# Patient Record
Sex: Female | Born: 1935 | Race: White | Hispanic: No | Smoking: Former smoker
Health system: Southern US, Community
[De-identification: ages and names within clinical notes are randomized; demographics above are authoritative.]

## PROBLEM LIST (undated history)

## (undated) DIAGNOSIS — K219 Gastro-esophageal reflux disease without esophagitis: Secondary | ICD-10-CM

## (undated) DIAGNOSIS — J9690 Respiratory failure, unspecified, unspecified whether with hypoxia or hypercapnia: Secondary | ICD-10-CM

## (undated) DIAGNOSIS — C50919 Malignant neoplasm of unspecified site of unspecified female breast: Secondary | ICD-10-CM

## (undated) DIAGNOSIS — I4891 Unspecified atrial fibrillation: Secondary | ICD-10-CM

## (undated) DIAGNOSIS — I10 Essential (primary) hypertension: Secondary | ICD-10-CM

## (undated) DIAGNOSIS — I509 Heart failure, unspecified: Secondary | ICD-10-CM

## (undated) DIAGNOSIS — K222 Esophageal obstruction: Secondary | ICD-10-CM

## (undated) DIAGNOSIS — N183 Chronic kidney disease, stage 3 unspecified: Secondary | ICD-10-CM

## (undated) DIAGNOSIS — I739 Peripheral vascular disease, unspecified: Secondary | ICD-10-CM

## (undated) DIAGNOSIS — R570 Cardiogenic shock: Secondary | ICD-10-CM

## (undated) DIAGNOSIS — I5181 Takotsubo syndrome: Secondary | ICD-10-CM

## (undated) DIAGNOSIS — L0390 Cellulitis, unspecified: Secondary | ICD-10-CM

## (undated) DIAGNOSIS — J189 Pneumonia, unspecified organism: Secondary | ICD-10-CM

## (undated) DIAGNOSIS — I2510 Atherosclerotic heart disease of native coronary artery without angina pectoris: Secondary | ICD-10-CM

## (undated) DIAGNOSIS — K469 Unspecified abdominal hernia without obstruction or gangrene: Secondary | ICD-10-CM

## (undated) DIAGNOSIS — I213 ST elevation (STEMI) myocardial infarction of unspecified site: Secondary | ICD-10-CM

## (undated) DIAGNOSIS — J449 Chronic obstructive pulmonary disease, unspecified: Secondary | ICD-10-CM

## (undated) DIAGNOSIS — J90 Pleural effusion, not elsewhere classified: Secondary | ICD-10-CM

## (undated) DIAGNOSIS — K5790 Diverticulosis of intestine, part unspecified, without perforation or abscess without bleeding: Secondary | ICD-10-CM

## (undated) HISTORY — PX: OTHER SURGICAL HISTORY: SHX169

## (undated) HISTORY — DX: Cellulitis, unspecified: L03.90

## (undated) HISTORY — DX: Unspecified abdominal hernia without obstruction or gangrene: K46.9

## (undated) HISTORY — PX: CATARACT EXTRACTION: SUR2

## (undated) HISTORY — PX: MASTECTOMY: SHX3

## (undated) HISTORY — DX: ST elevation (STEMI) myocardial infarction of unspecified site: I21.3

## (undated) HISTORY — PX: CORONARY ANGIOPLASTY: SHX604

---

## 2011-04-20 ENCOUNTER — Inpatient Hospital Stay: Payer: Self-pay | Admitting: Specialist

## 2011-09-07 ENCOUNTER — Emergency Department: Payer: Self-pay | Admitting: *Deleted

## 2011-09-07 LAB — COMPREHENSIVE METABOLIC PANEL
Albumin: 3.8 g/dL (ref 3.4–5.0)
Alkaline Phosphatase: 49 U/L — ABNORMAL LOW (ref 50–136)
Anion Gap: 8 (ref 7–16)
BUN: 14 mg/dL (ref 7–18)
Calcium, Total: 9.1 mg/dL (ref 8.5–10.1)
Creatinine: 0.98 mg/dL (ref 0.60–1.30)
EGFR (African American): >60
EGFR (Non-African Amer.): 57 — ABNORMAL LOW
Glucose: 119 mg/dL — ABNORMAL HIGH (ref 65–99)
Osmolality: 274 (ref 275–301)
SGOT(AST): 23 U/L (ref 15–37)
SGPT (ALT): 18 U/L
Sodium: 136 mmol/L (ref 136–145)

## 2011-09-07 LAB — CBC
MCH: 32.4 pg (ref 26.0–34.0)
MCHC: 33.7 g/dL (ref 32.0–36.0)
MCV: 96 fL (ref 80–100)
WBC: 8.9 10*3/uL (ref 3.6–11.0)

## 2012-01-07 ENCOUNTER — Emergency Department: Payer: Self-pay | Admitting: Emergency Medicine

## 2012-01-07 LAB — URINALYSIS, COMPLETE
Bilirubin,UR: NEGATIVE
Nitrite: POSITIVE
Ph: 7 (ref 4.5–8.0)
Protein: NEGATIVE
RBC,UR: 2 /HPF (ref 0–5)
Specific Gravity: 1.005 (ref 1.003–1.030)
Squamous Epithelial: 1
WBC UR: 53 /HPF (ref 0–5)

## 2012-01-07 LAB — BASIC METABOLIC PANEL
Anion Gap: 7 (ref 7–16)
BUN: 11 mg/dL (ref 7–18)
Calcium, Total: 9.3 mg/dL (ref 8.5–10.1)
Chloride: 104 mmol/L (ref 98–107)
Co2: 26 mmol/L (ref 21–32)
Osmolality: 273 (ref 275–301)
Potassium: 4.3 mmol/L (ref 3.5–5.1)
Sodium: 137 mmol/L (ref 136–145)

## 2012-03-27 ENCOUNTER — Inpatient Hospital Stay: Payer: Self-pay | Admitting: Internal Medicine

## 2012-03-27 LAB — CBC
HCT: 35.9 % (ref 35.0–47.0)
HGB: 12.1 g/dL (ref 12.0–16.0)
MCH: 32.2 pg (ref 26.0–34.0)
MCV: 96 fL (ref 80–100)
RBC: 3.75 10*6/uL — ABNORMAL LOW (ref 3.80–5.20)
RDW: 13.7 % (ref 11.5–14.5)
WBC: 6.7 10*3/uL (ref 3.6–11.0)

## 2012-03-27 LAB — COMPREHENSIVE METABOLIC PANEL
Albumin: 2.5 g/dL — ABNORMAL LOW (ref 3.4–5.0)
Anion Gap: 6 — ABNORMAL LOW (ref 7–16)
BUN: 12 mg/dL (ref 7–18)
Bilirubin,Total: 0.4 mg/dL (ref 0.2–1.0)
Chloride: 98 mmol/L (ref 98–107)
Co2: 32 mmol/L (ref 21–32)
Creatinine: 0.76 mg/dL (ref 0.60–1.30)
EGFR (African American): >60
EGFR (Non-African Amer.): >60
Glucose: 152 mg/dL — ABNORMAL HIGH (ref 65–99)
Osmolality: 275 (ref 275–301)
SGOT(AST): 29 U/L (ref 15–37)
Sodium: 136 mmol/L (ref 136–145)
Total Protein: 7.5 g/dL (ref 6.4–8.2)

## 2012-03-27 LAB — TROPONIN I: Troponin-I: <0.02 ng/mL

## 2012-03-27 LAB — CK TOTAL AND CKMB (NOT AT ARMC)
CK, Total: 40 U/L
CK-MB: 1 ng/mL
CK-MB: 1.1 ng/mL (ref 0.5–3.6)

## 2012-03-27 LAB — PRO B NATRIURETIC PEPTIDE: B-Type Natriuretic Peptide: 980 pg/mL — ABNORMAL HIGH

## 2012-03-28 DIAGNOSIS — I200 Unstable angina: Secondary | ICD-10-CM

## 2012-03-28 LAB — BASIC METABOLIC PANEL
Anion Gap: 7 (ref 7–16)
BUN: 18 mg/dL (ref 7–18)
Chloride: 99 mmol/L (ref 98–107)
EGFR (Non-African Amer.): 56 — ABNORMAL LOW
Osmolality: 284 (ref 275–301)

## 2012-03-28 LAB — CBC WITH DIFFERENTIAL/PLATELET
Basophil #: 0 10*3/uL (ref 0.0–0.1)
Eosinophil #: 0 10*3/uL (ref 0.0–0.7)
Eosinophil %: 0 %
HCT: 33.6 % — ABNORMAL LOW (ref 35.0–47.0)
HGB: 11.5 g/dL — ABNORMAL LOW (ref 12.0–16.0)
Lymphocyte #: 0.4 10*3/uL — ABNORMAL LOW (ref 1.0–3.6)
MCH: 32.7 pg (ref 26.0–34.0)
MCHC: 34.2 g/dL (ref 32.0–36.0)
MCV: 96 fL (ref 80–100)
Monocyte #: 0.1 x10 3/mm — ABNORMAL LOW (ref 0.2–0.9)
Neutrophil #: 6.3 10*3/uL (ref 1.4–6.5)
Neutrophil %: 92.3 %
Platelet: 389 10*3/uL (ref 150–440)
RBC: 3.52 10*6/uL — ABNORMAL LOW (ref 3.80–5.20)
RDW: 13.6 % (ref 11.5–14.5)

## 2012-03-28 LAB — LIPID PANEL
HDL Cholesterol: 27 mg/dL — ABNORMAL LOW (ref 40–60)
Ldl Cholesterol, Calc: 32 mg/dL (ref 0–100)
VLDL Cholesterol, Calc: 8 mg/dL (ref 5–40)

## 2012-03-28 LAB — CK TOTAL AND CKMB (NOT AT ARMC): CK-MB: 1.2 ng/mL (ref 0.5–3.6)

## 2012-03-28 LAB — TROPONIN I: Troponin-I: <0.02 ng/mL

## 2012-03-28 LAB — T4, FREE: Free Thyroxine: 1.29 ng/dL (ref 0.76–1.46)

## 2012-03-28 LAB — HEMOGLOBIN A1C: Hemoglobin A1C: 6.4 % — ABNORMAL HIGH (ref 4.2–6.3)

## 2012-04-02 LAB — CULTURE, BLOOD (SINGLE)

## 2012-08-24 ENCOUNTER — Ambulatory Visit: Payer: Self-pay | Admitting: Family Medicine

## 2012-09-03 ENCOUNTER — Emergency Department: Payer: Self-pay | Admitting: Emergency Medicine

## 2012-09-03 LAB — URINALYSIS, COMPLETE
Bacteria: NONE SEEN
Bilirubin,UR: NEGATIVE
Blood: NEGATIVE
Glucose,UR: NEGATIVE mg/dL (ref 0–75)
Ketone: NEGATIVE
Nitrite: NEGATIVE
Ph: 7 (ref 4.5–8.0)
Protein: NEGATIVE
Specific Gravity: 1.009 (ref 1.003–1.030)
Squamous Epithelial: <1
WBC UR: 3 /HPF (ref 0–5)

## 2012-09-03 LAB — TROPONIN I: Troponin-I: <0.02 ng/mL

## 2012-09-03 LAB — CBC
MCH: 32.7 pg (ref 26.0–34.0)
MCV: 95 fL (ref 80–100)
Platelet: 399 10*3/uL (ref 150–440)
RDW: 13.1 % (ref 11.5–14.5)

## 2012-09-03 LAB — COMPREHENSIVE METABOLIC PANEL
Albumin: 2.9 g/dL — ABNORMAL LOW (ref 3.4–5.0)
Alkaline Phosphatase: 68 U/L (ref 50–136)
Anion Gap: 4 — ABNORMAL LOW (ref 7–16)
BUN: 15 mg/dL (ref 7–18)
Chloride: 99 mmol/L (ref 98–107)
Co2: 31 mmol/L (ref 21–32)
Creatinine: 1.16 mg/dL (ref 0.60–1.30)
EGFR (African American): 49 — ABNORMAL LOW
EGFR (Non-African Amer.): 42 — ABNORMAL LOW
Glucose: 118 mg/dL — ABNORMAL HIGH (ref 65–99)
Osmolality: 270 (ref 275–301)
SGOT(AST): 20 U/L (ref 15–37)
SGPT (ALT): 16 U/L (ref 12–78)
Sodium: 134 mmol/L — ABNORMAL LOW (ref 136–145)

## 2012-09-03 LAB — CK TOTAL AND CKMB (NOT AT ARMC): CK, Total: 40 U/L (ref 21–215)

## 2012-10-08 DIAGNOSIS — I5181 Takotsubo syndrome: Secondary | ICD-10-CM

## 2012-10-08 HISTORY — DX: Takotsubo syndrome: I51.81

## 2012-10-24 ENCOUNTER — Inpatient Hospital Stay (HOSPITAL_COMMUNITY): Payer: Medicare Other

## 2012-10-24 ENCOUNTER — Other Ambulatory Visit: Payer: Self-pay

## 2012-10-24 ENCOUNTER — Encounter (HOSPITAL_COMMUNITY): Payer: Self-pay | Admitting: Internal Medicine

## 2012-10-24 ENCOUNTER — Inpatient Hospital Stay (HOSPITAL_COMMUNITY)
Admission: EM | Admit: 2012-10-24 | Discharge: 2012-11-06 | DRG: 208 | Disposition: A | Discharge status: Skilled Nursing Facility | Payer: Medicare Other | Source: Other Acute Inpatient Hospital | Attending: Cardiovascular Disease | Admitting: Cardiovascular Disease

## 2012-10-24 ENCOUNTER — Encounter (HOSPITAL_COMMUNITY)
Admission: EM | Disposition: A | Discharge status: Skilled Nursing Facility | Payer: Self-pay | Attending: Cardiovascular Disease

## 2012-10-24 ENCOUNTER — Inpatient Hospital Stay: Payer: Self-pay | Admitting: Internal Medicine

## 2012-10-24 DIAGNOSIS — R630 Anorexia: Secondary | ICD-10-CM | POA: Diagnosis present

## 2012-10-24 DIAGNOSIS — I739 Peripheral vascular disease, unspecified: Secondary | ICD-10-CM | POA: Diagnosis present

## 2012-10-24 DIAGNOSIS — Z923 Personal history of irradiation: Secondary | ICD-10-CM

## 2012-10-24 DIAGNOSIS — I5021 Acute systolic (congestive) heart failure: Secondary | ICD-10-CM

## 2012-10-24 DIAGNOSIS — D72829 Elevated white blood cell count, unspecified: Secondary | ICD-10-CM | POA: Diagnosis present

## 2012-10-24 DIAGNOSIS — K219 Gastro-esophageal reflux disease without esophagitis: Secondary | ICD-10-CM

## 2012-10-24 DIAGNOSIS — Z9221 Personal history of antineoplastic chemotherapy: Secondary | ICD-10-CM

## 2012-10-24 DIAGNOSIS — J9601 Acute respiratory failure with hypoxia: Secondary | ICD-10-CM

## 2012-10-24 DIAGNOSIS — J9600 Acute respiratory failure, unspecified whether with hypoxia or hypercapnia: Principal | ICD-10-CM

## 2012-10-24 DIAGNOSIS — K222 Esophageal obstruction: Secondary | ICD-10-CM

## 2012-10-24 DIAGNOSIS — Z853 Personal history of malignant neoplasm of breast: Secondary | ICD-10-CM

## 2012-10-24 DIAGNOSIS — Z9010 Acquired absence of unspecified breast and nipple: Secondary | ICD-10-CM

## 2012-10-24 DIAGNOSIS — R5381 Other malaise: Secondary | ICD-10-CM | POA: Diagnosis present

## 2012-10-24 DIAGNOSIS — I213 ST elevation (STEMI) myocardial infarction of unspecified site: Secondary | ICD-10-CM

## 2012-10-24 DIAGNOSIS — D696 Thrombocytopenia, unspecified: Secondary | ICD-10-CM | POA: Diagnosis not present

## 2012-10-24 DIAGNOSIS — I129 Hypertensive chronic kidney disease with stage 1 through stage 4 chronic kidney disease, or unspecified chronic kidney disease: Secondary | ICD-10-CM | POA: Diagnosis present

## 2012-10-24 DIAGNOSIS — J4489 Other specified chronic obstructive pulmonary disease: Secondary | ICD-10-CM | POA: Diagnosis present

## 2012-10-24 DIAGNOSIS — I4891 Unspecified atrial fibrillation: Secondary | ICD-10-CM

## 2012-10-24 DIAGNOSIS — I219 Acute myocardial infarction, unspecified: Secondary | ICD-10-CM

## 2012-10-24 DIAGNOSIS — R570 Cardiogenic shock: Secondary | ICD-10-CM

## 2012-10-24 DIAGNOSIS — J90 Pleural effusion, not elsewhere classified: Secondary | ICD-10-CM

## 2012-10-24 DIAGNOSIS — IMO0002 Reserved for concepts with insufficient information to code with codable children: Secondary | ICD-10-CM

## 2012-10-24 DIAGNOSIS — J449 Chronic obstructive pulmonary disease, unspecified: Secondary | ICD-10-CM

## 2012-10-24 DIAGNOSIS — I2109 ST elevation (STEMI) myocardial infarction involving other coronary artery of anterior wall: Secondary | ICD-10-CM | POA: Diagnosis present

## 2012-10-24 DIAGNOSIS — E876 Hypokalemia: Secondary | ICD-10-CM | POA: Diagnosis present

## 2012-10-24 DIAGNOSIS — I2510 Atherosclerotic heart disease of native coronary artery without angina pectoris: Secondary | ICD-10-CM | POA: Diagnosis present

## 2012-10-24 DIAGNOSIS — J189 Pneumonia, unspecified organism: Secondary | ICD-10-CM

## 2012-10-24 DIAGNOSIS — R7309 Other abnormal glucose: Secondary | ICD-10-CM | POA: Diagnosis not present

## 2012-10-24 DIAGNOSIS — I5181 Takotsubo syndrome: Secondary | ICD-10-CM

## 2012-10-24 DIAGNOSIS — J690 Pneumonitis due to inhalation of food and vomit: Secondary | ICD-10-CM | POA: Diagnosis present

## 2012-10-24 DIAGNOSIS — I509 Heart failure, unspecified: Secondary | ICD-10-CM | POA: Diagnosis present

## 2012-10-24 DIAGNOSIS — N183 Chronic kidney disease, stage 3 unspecified: Secondary | ICD-10-CM | POA: Diagnosis present

## 2012-10-24 HISTORY — DX: Gastro-esophageal reflux disease without esophagitis: K21.9

## 2012-10-24 HISTORY — DX: Essential (primary) hypertension: I10

## 2012-10-24 HISTORY — DX: Respiratory failure, unspecified, unspecified whether with hypoxia or hypercapnia: J96.90

## 2012-10-24 HISTORY — DX: Unspecified atrial fibrillation: I48.91

## 2012-10-24 HISTORY — DX: Chronic kidney disease, stage 3 (moderate): N18.3

## 2012-10-24 HISTORY — DX: Pleural effusion, not elsewhere classified: J90

## 2012-10-24 HISTORY — DX: Atherosclerotic heart disease of native coronary artery without angina pectoris: I25.10

## 2012-10-24 HISTORY — DX: Takotsubo syndrome: I51.81

## 2012-10-24 HISTORY — DX: Diverticulosis of intestine, part unspecified, without perforation or abscess without bleeding: K57.90

## 2012-10-24 HISTORY — DX: Esophageal obstruction: K22.2

## 2012-10-24 HISTORY — DX: Chronic kidney disease, stage 3 unspecified: N18.30

## 2012-10-24 HISTORY — DX: Pneumonia, unspecified organism: J18.9

## 2012-10-24 HISTORY — DX: Cardiogenic shock: R57.0

## 2012-10-24 HISTORY — DX: Malignant neoplasm of unspecified site of unspecified female breast: C50.919

## 2012-10-24 HISTORY — DX: Chronic obstructive pulmonary disease, unspecified: J44.9

## 2012-10-24 HISTORY — DX: Peripheral vascular disease, unspecified: I73.9

## 2012-10-24 HISTORY — PX: LEFT HEART CATHETERIZATION WITH CORONARY ANGIOGRAM: SHX5451

## 2012-10-24 LAB — POCT I-STAT 3, ART BLOOD GAS (G3+)
O2 Saturation: 99 %
pCO2 arterial: 44.4 mmHg (ref 35.0–45.0)
pO2, Arterial: 161 mmHg — ABNORMAL HIGH (ref 80.0–100.0)

## 2012-10-24 LAB — COMPREHENSIVE METABOLIC PANEL
ALT: 53 U/L — ABNORMAL HIGH (ref 0–35)
Albumin: 3.4 g/dL (ref 3.4–5.0)
Alkaline Phosphatase: 77 U/L (ref 50–136)
Alkaline Phosphatase: 40 U/L (ref 39–117)
Anion Gap: 11 (ref 7–16)
BUN: 12 mg/dL (ref 7–18)
Bilirubin,Total: 0.4 mg/dL (ref 0.2–1.0)
CO2: 19 mEq/L (ref 19–32)
Calcium, Total: 8.6 mg/dL (ref 8.5–10.1)
Chloride: 100 mmol/L (ref 98–107)
Chloride: 101 mEq/L (ref 96–112)
Co2: 25 mmol/L (ref 21–32)
Creatinine: 1.05 mg/dL (ref 0.60–1.30)
EGFR (African American): 55 — ABNORMAL LOW
EGFR (Non-African Amer.): 47 — ABNORMAL LOW
GFR calc Af Amer: 63 mL/min — ABNORMAL LOW (ref >90)
GFR calc non Af Amer: 54 mL/min — ABNORMAL LOW (ref >90)
Glucose, Bld: 253 mg/dL — ABNORMAL HIGH (ref 70–99)
Glucose: 361 mg/dL — ABNORMAL HIGH (ref 65–99)
Potassium: 4 mmol/L (ref 3.5–5.1)
Potassium: 4.3 mEq/L (ref 3.5–5.1)
SGOT(AST): 98 U/L — ABNORMAL HIGH (ref 15–37)
Sodium: 134 mEq/L — ABNORMAL LOW (ref 135–145)
Total Protein: 7.2 g/dL (ref 6.4–8.2)

## 2012-10-24 LAB — CBC
HGB: 13.9 g/dL (ref 12.0–16.0)
MCH: 32.2 pg (ref 26.0–34.0)
Platelet: 238 10*3/uL (ref 150–440)
RDW: 15 % — ABNORMAL HIGH (ref 11.5–14.5)

## 2012-10-24 LAB — URINALYSIS, COMPLETE
Blood: NEGATIVE
Nitrite: NEGATIVE
Protein: 30
Specific Gravity: 1.01 (ref 1.003–1.030)
WBC UR: <1 /HPF (ref 0–5)

## 2012-10-24 LAB — LACTIC ACID, PLASMA: Lactic Acid, Venous: 3.1 mmol/L — ABNORMAL HIGH (ref 0.5–2.2)

## 2012-10-24 LAB — CK TOTAL AND CKMB (NOT AT ARMC)
CK, Total: 112 U/L (ref 21–215)
CK-MB: 6.1 ng/mL — ABNORMAL HIGH (ref 0.5–3.6)

## 2012-10-24 LAB — PROTIME-INR: INR: 1.11 (ref 0.00–1.49)

## 2012-10-24 LAB — CBC WITH DIFFERENTIAL/PLATELET
HCT: 40.8 % (ref 36.0–46.0)
Hemoglobin: 14 g/dL (ref 12.0–15.0)
Lymphocytes Relative: 7 % — ABNORMAL LOW (ref 12–46)
Lymphs Abs: 0.8 10*3/uL (ref 0.7–4.0)
MCHC: 34.3 g/dL (ref 30.0–36.0)
Monocytes Absolute: 0.8 10*3/uL (ref 0.1–1.0)
Monocytes Relative: 7 % (ref 3–12)
Neutro Abs: 9.5 10*3/uL — ABNORMAL HIGH (ref 1.7–7.7)

## 2012-10-24 LAB — LIPASE, BLOOD: Lipase: 156 U/L (ref 73–393)

## 2012-10-24 LAB — HEMOGLOBIN A1C: Hemoglobin A1C: 6.2 % (ref 4.2–6.3)

## 2012-10-24 LAB — TROPONIN I
Troponin I: 8.67 ng/mL (ref <0.30)
Troponin-I: 1.4 ng/mL — ABNORMAL HIGH

## 2012-10-24 SURGERY — LEFT HEART CATHETERIZATION WITH CORONARY ANGIOGRAM
Anesthesia: LOCAL

## 2012-10-24 MED ORDER — BIOTENE DRY MOUTH MT LIQD
15.0000 mL | Freq: Four times a day (QID) | OROMUCOSAL | Status: DC
  Administered 2012-10-25 – 2012-10-26 (×6): 15 mL via OROMUCOSAL

## 2012-10-24 MED ORDER — SODIUM CHLORIDE 0.9 % IV SOLN
25.0000 ug/h | INTRAVENOUS | Status: DC
  Administered 2012-10-24: 50 ug/h via INTRAVENOUS
  Filled 2012-10-24: qty 50

## 2012-10-24 MED ORDER — FENTANYL BOLUS VIA INFUSION
25.0000 ug | Freq: Four times a day (QID) | INTRAVENOUS | Status: DC | PRN
  Filled 2012-10-24: qty 100

## 2012-10-24 MED ORDER — ACETAMINOPHEN 325 MG PO TABS: 650.0000 mg | ORAL_TABLET | ORAL | Status: DC | PRN

## 2012-10-24 MED ORDER — PANTOPRAZOLE SODIUM 40 MG IV SOLR
40.0000 mg | Freq: Every day | INTRAVENOUS | Status: DC
  Administered 2012-10-24 – 2012-10-25 (×2): 40 mg via INTRAVENOUS
  Filled 2012-10-24 (×4): qty 40

## 2012-10-24 MED ORDER — ASPIRIN EC 81 MG PO TBEC: 81.0000 mg | DELAYED_RELEASE_TABLET | Freq: Every day | ORAL | Status: DC

## 2012-10-24 MED ORDER — HEPARIN SODIUM (PORCINE) 5000 UNIT/ML IJ SOLN
5000.0000 [IU] | Freq: Three times a day (TID) | INTRAMUSCULAR | Status: DC
  Administered 2012-10-25 – 2012-10-28 (×10): 5000 [IU] via SUBCUTANEOUS
  Filled 2012-10-24 (×13): qty 1

## 2012-10-24 MED ORDER — ASPIRIN 81 MG PO CHEW
81.0000 mg | CHEWABLE_TABLET | Freq: Every day | ORAL | Status: DC
  Administered 2012-10-24 – 2012-11-06 (×13): 81 mg via ORAL
  Filled 2012-10-24 (×13): qty 1

## 2012-10-24 MED ORDER — CHLORHEXIDINE GLUCONATE 0.12 % MT SOLN
15.0000 mL | Freq: Two times a day (BID) | OROMUCOSAL | Status: DC
  Administered 2012-10-24 – 2012-10-25 (×3): 15 mL via OROMUCOSAL
  Filled 2012-10-24 (×3): qty 15

## 2012-10-24 MED ORDER — HEPARIN (PORCINE) IN NACL 2-0.9 UNIT/ML-% IJ SOLN
INTRAMUSCULAR | Status: AC
  Filled 2012-10-24: qty 1000

## 2012-10-24 MED ORDER — SODIUM CHLORIDE 0.9 % IV SOLN
1.0000 mg/h | INTRAVENOUS | Status: DC
  Filled 2012-10-24: qty 20

## 2012-10-24 MED ORDER — ALBUTEROL SULFATE HFA 108 (90 BASE) MCG/ACT IN AERS
4.0000 | INHALATION_SPRAY | RESPIRATORY_TRACT | Status: DC | PRN
  Administered 2012-10-26: 4 via RESPIRATORY_TRACT
  Filled 2012-10-24: qty 6.7

## 2012-10-24 MED ORDER — SODIUM CHLORIDE 0.9 % IV SOLN
INTRAVENOUS | Status: AC
  Administered 2012-10-24: 18:00:00 via INTRAVENOUS

## 2012-10-24 MED ORDER — LIDOCAINE HCL (PF) 1 % IJ SOLN
INTRAMUSCULAR | Status: AC
  Filled 2012-10-24: qty 30

## 2012-10-24 MED ORDER — ONDANSETRON HCL 4 MG/2ML IJ SOLN: 4.0000 mg | Freq: Four times a day (QID) | INTRAMUSCULAR | Status: DC | PRN

## 2012-10-24 MED ORDER — NOREPINEPHRINE BITARTRATE 1 MG/ML IJ SOLN
2.0000 ug/min | INTRAMUSCULAR | Status: DC
  Administered 2012-10-24: 12 ug/min via INTRAVENOUS
  Administered 2012-10-25: 11 ug/min via INTRAVENOUS
  Administered 2012-10-25: 12 ug/min via INTRAVENOUS
  Filled 2012-10-24 (×3): qty 4

## 2012-10-24 MED ORDER — NITROGLYCERIN 0.4 MG SL SUBL: 0.4000 mg | SUBLINGUAL_TABLET | SUBLINGUAL | Status: DC | PRN

## 2012-10-24 MED ORDER — MIDAZOLAM HCL 2 MG/2ML IJ SOLN
1.0000 mg | INTRAMUSCULAR | Status: DC | PRN
  Administered 2012-10-24: 2 mg via INTRAVENOUS
  Filled 2012-10-24: qty 2

## 2012-10-24 NOTE — Consult Note (Addendum)
PULMONARY  / CRITICAL CARE MEDICINE  Name: [name] [name] MRN: [id number] DOB: September 18, 1922    ADMISSION DATE:  10/24/2012 CONSULTATION DATE:  10/24/2012  REFERRING MD :  Excell Seltzer PRIMARY SERVICE: Cardiology  CHIEF COMPLAINT:  Acute respiratory failure  BRIEF PATIENT DESCRIPTION: 79 y/o female with diverticulosis, COPD, and breast cancer was admitted to [facility] on 5/17 after acute respiratory failure.  She was intubated in the [facility] and brought to the [facility] cath lab where she was found to have tako-tsubo disease.   SIGNIFICANT EVENTS / STUDIES:  5/17 admitted> 5/17 LHC > Takotsubo syndrome with severe LV systolic dysfunction, mild non-obstructive CAD  LINES / TUBES: 5/17 ETT >> 5/17 L IJ CVL >>  CULTURES:   ANTIBIOTICS:   HISTORY OF PRESENT ILLNESS:  79 y/o female with diverticulosis, COPD, and breast cancer was admitted to [facility] on 5/17 after acute respiratory failure.  She was intubated in the [facility] and brought to the [facility] cath lab where she was found to have tako-tsubo disease.   Her family reports that she is very active despite her age and comorbid illnesses and was feeling well on 5/16.  However overnight she developed burning epigastric and chest pain.  She did not go to the ED until later in the morning on 5/17 where she was in respiratory failure.  No other history could be obtained.  PAST MEDICAL HISTORY :  Past Medical History  Diagnosis Date  . HTN (hypertension)   . COPD (chronic obstructive pulmonary disease)   . Diverticulosis   . Breast CA    Past Surgical History  Procedure Laterality Date  . Mastectomy    . Cataract extraction     Prior to Admission medications   Not on File   Allergies not on file  FAMILY HISTORY:  Family History  Problem Relation Age of Onset  . CAD     SOCIAL HISTORY:  has no tobacco, alcohol, and drug history on file.  REVIEW OF SYSTEMS:  Cannot obtain due to intubation  SUBJECTIVE:   VITAL SIGNS: Pulse Rate:  [81-158] 81 (05/17  1646) Resp:  [16-24] 24 (05/17 1646) BP: (90-99)/(59-64) 99/64 mmHg (05/17 1646) SpO2:  [100 %] 100 % (05/17 1646) HEMODYNAMICS:   VENTILATOR SETTINGS: Vent Mode:  [-] PRVC Vt Set:  [480 mL] 480 mL INTAKE / OUTPUT: Intake/Output   None     PHYSICAL EXAMINATION:  Gen: sedated on vent, no acute distress HEENT: NCAT, PERRL, ETT in place PULM: rhonchi and rales bilaterally L > R CV: RRR, no mgr, no JVD AB: BS infrequent, soft, nontender, no hsm Ext: warm, no edema, no clubbing, no cyanosis Derm: no rash or skin breakdown Neuro: sedated on vent, RASS -4  LABS: No results found for this basename: HGB, WBC, PLT, NA, K, CL, CO2, GLUCOSE, BUN, CREATININE, CALCIUM, MG, PHOS, AST, ALT, ALKPHOS, BILITOT, PROT, ALBUMIN, APTT, INR, LATICACIDVEN, TROPONINI, PROCALCITON, PROBNP, O2SATVEN, PHART, PCO2ART, PO2ART,  in the last 168 hours  Recent Labs Lab 10/24/12 1643  GLUCAP 198*    517 CXR:  pending  ASSESSMENT / PLAN:  PULMONARY A: Acute respiratory failure from acute pulmonary edema COPD RUL infiltrate > edema? CAP? P:   -full vent support -scheduled and prn bronchodilators -abg now -cxr now -daily wua/sbt  CARDIOVASCULAR A: Cardiogenic shock from Takotsubo  P:  -levophed -cvl placed -check CVP -diuresis per cardiology  RENAL A:  No acute issues P:   -monitor UOP, BMET  GASTROINTESTINAL A:  Diverticulosis> not in flare P:   -  pantoprazole for stress ulcer prophylaxis  HEMATOLOGIC A:  No sign of fever, but atypical infiltrate worrisome for pneumonia P:  -monitor for bleeding -blood and respiratory culture, hold antibiotics for now unless febrile, elevated WBC  INFECTIOUS A:  No acute issues P:  -monitor fever curve  ENDOCRINE A:  No acute issues P:   -monitor glucose  NEUROLOGIC A:  Sedation needs on vent P:   -fentanyl gtt and prn versed titrated to RASS -1  Family updated at bedside by me.  TODAY'S SUMMARY: 79 y/o female with takotsubo  cardiomyopathy causing acute respiratory failure.  Currently in cardiogenic shock.  I have personally obtained a history, examined the patient, evaluated laboratory and imaging results, formulated the assessment and plan and placed orders. CRITICAL CARE: The patient is critically ill with multiple organ systems failure and requires high complexity decision making for assessment and support, frequent evaluation and titration of therapies, application of advanced monitoring technologies and extensive interpretation of multiple databases. Critical Care Time devoted to patient care services described in this note is 60 minutes.   Fonnie Jarvis Pulmonary and Critical Care Medicine [facility] Pager: [phone]  10/24/2012, 5:09 PM

## 2012-10-24 NOTE — Procedures (Signed)
Central Venous Catheter Insertion Procedure Note [name] [name] [id number] [birth year]  Procedure: Insertion of Central Venous Catheter Indications: Assessment of intravascular volume and Drug and/or fluid administration  Procedure Details Consent: Risks of procedure as well as the alternatives and risks of each were explained to the (patient/caregiver).  Consent for procedure obtained. Time Out: Verified patient identification, verified procedure, site/side was marked, verified correct patient position, special equipment/implants available, medications/allergies/relevent history reviewed, required imaging and test results available.  Performed  Maximum sterile technique was used including antiseptics, cap, gloves, gown, hand hygiene, mask and sheet. Skin prep: Chlorhexidine; local anesthetic administered A antimicrobial bonded/coated triple lumen catheter was placed in the left internal jugular vein using the Seldinger technique.  The left subclavian vein was accessed but the wire would not pass.  Ultrasound was used to verify the patency of the vein and for real time needle guidance.  Evaluation Blood flow good Complications: No apparent complications Patient did tolerate procedure well. Chest X-ray ordered to verify placement.  CXR: pending.  MCQUAID, DOUGLAS 10/24/2012, 5:08 PM

## 2012-10-24 NOTE — H&P (Signed)
Patient ID: [name] [name] MRN: [id number], DOB/AGE: [birth year]   Admit date: 10/24/2012 Date of Consult: @TODAY @  Primary Physician:  Primary Cardiologist: New   Problem List:  HOme Meds:  Omeprazole 20 qd; Cranberry; Vit B6; One A Day; Probiotic capsule; Atenolol 25 mg; Enalaprill 20 mg; Spiriva 1 puff qd; ASA 81 mg, Vit B12; Vit C. PMH.  HTN, Diverticulosis, Breast cancer (s/p bilateral mastectomy, Chemo, XRT), COPD PSH:  Mastectomy (bilateral), Cataract R/L SHx.  No tobacco  Quit  No EtOH Fhx:  Mother with Hx of CAD, CVA. ROS  Not obtained as patient intubated.   Allergies: PCN, Sulfa  HPI:  Patient is an 79 yo who I saw at [facility]  History provided by daughter-in-law and ER notes The patient was at home yesterday (lives alone, drives).  Went out for [employer] and dinner Spoke to daughter in Social worker at 7 AM  Pender she had been up from 2AM on with burning in stomach and vomiting. Now complained of SOB  Daughter'in law called EMS When EMS arrived, patient cyanotic.  Sat 60% on RA  BP 90/60  Patient not responsive Placed on NRB the intubated in ER.  Started on levophed Given lovenox 75 mg.  Admitted to ICU Initial EMS EKG at 8AM showed SR without signif ST elevation.  BY 9;48 AM EKG with ST, IVCD.  Sl ST elevation in V4/V5.  Repeat EKG at 1 PM with SR ST elevation V3 to V6 (2mm)  R waves still present in anterolateral leads Discussed with family members.  Plans made for tx to [facility] for Cath.   Inpatient Medications:   No family history on file.   History   Social History  . Marital Status: Widowed    Spouse Name: N/A    Number of Children: N/A  . Years of Education: N/A   Occupational History  . Not on file.   Social History Main Topics  . Smoking status: Not on file  . Smokeless tobacco: Not on file  . Alcohol Use: Not on file  . Drug Use: Not on file  . Sexually Active: Not on file   Other Topics Concern  . Not on file   Social History Narrative  . No narrative on file      Review of Systems: Not obtained as patient intubated  Physical Exam: Patient examined at [facility] BP 97/50  P 80 (SR)  O2 sat on 85% FiO2:  100%  General:  Patient intubated, sedated Head: Normocephalic, atraumatic Neck: Negative for carotid bruits. JVP appears increased. Lungs: Relatively clear anteriorly. Heart: RRR with S1 S2. No murmurs, rubs, or gallops appreciated. Abdomen: Soft, no masses.  No hepatomegaly  Decreased BS Extremities: No clubbing, cyanosis or edema.  Feet are cool  Tr PT pulses Neuro:  Patinet intubated, sedated  Labs: Initial labs from earlier today at [facility].  Hgb:  13.9; WBC:  10.3; Plt 238;  CK 112/ MB 6.1; Trop 1.4;  ABG:  PH 7.2; Cr 1.05; K 4.  AST 98; ALT 62   Radiology/Studies: head CT No infarct.  CXR:  Bilateral infiltrates.  EKG:  See above HPI  ASSESSMENT AND PLAN:  79 yo with respiratory failure, possible pneumonia.  Now with ST elevation MI.  Appears less than 6 hours when I saw her.  Patient transferred to [facility] for cardiac catheterization. Will need to have CCM follow patient for vent management/possible pneumonia.     [name] 10/24/2012, 3:54 PM

## 2012-10-24 NOTE — CV Procedure (Signed)
   Cardiac Catheterization Procedure Note  Name: [name] [name] MRN: [id number] DOB: 03-Apr-1923  Procedure: Left Heart Cath, Selective Coronary Angiography, LV angiography  Indication: Anterolateral STEMI. This is an 79 year old woman who presented to [facility] with acute respiratory failure. She was in her normal state of health yesterday evening. She went out to dinner. She lives independently and woke up in the middle of the night with severe burning in her abdomen and chest. She called EMS this morning and upon their arrival she was in respiratory distress requiring intubation. She was transported to the hospital where she ultimately developed ST changes consistent with an anterolateral STEMI. She was evaluated by Dr. Tenny Craw. There was prolonged discussion with multiple family members in an attempt to make a decision about how aggressive to be with her care considering her advanced age. Ultimately the decision was made to transport her here for cardiac catheterization and possible PCI.  Procedural details: The right groin was prepped, draped, and anesthetized with 1% lidocaine. Using modified Seldinger technique, a 6 French sheath was introduced into the right femoral artery. Standard Judkins catheters were used for coronary angiography and left ventriculography. Catheter exchanges were performed over a guidewire. There were no immediate procedural complications. A Perclose device was used for femoral hemostasis. The patient was transported directly to the CCU following the procedure.  Procedural Findings: Hemodynamics:  AO 91/51 with a mean of 67 LV 91/22   Coronary angiography: Coronary dominance: right  Left mainstem: Widely patent with very mild 20% ostial stenosis. There is no significant calcification.  Left anterior descending (LAD): The LAD is patent to the left ventricular apex. The distal flow pattern in the LAD demonstrates mildly slow flow. There is diffuse  irregularity but essentially the vessel is widely patent throughout. The diagonals are patent with mild ostial stenosis. There are no severe flow limiting stenoses throughout the course of the LAD distribution. The vessel wraps around the left ventricular apex.  Left circumflex (LCx): The left circumflex is also patent throughout. There is minor irregularity. There were 2 obtuse marginal branches with no significant stenoses. The left posterolateral branch is patent without significant stenosis.  Right coronary artery (RCA): The right coronary artery is a large, dominant vessel. The vessel is smooth throughout its course. The PDA and PLA branches are both large without significant stenoses the  Left ventriculography: Left ventricular function is markedly depressed. The ventricle has a classic appearance of Takotsubo cardiomyopathy. The anterior base and inferior base of the heart are dynamic. The entire anterolateral, periapical, and inferoapical/mid inferior walls are akinetic. The estimated left ventricular ejection fraction is 20%. There is no appreciable mitral regurgitation.  Final Conclusions:   1. Acute cardiogenic shock secondary to Takotsubo syndrome with severe segmental left ventricular systolic dysfunction 2. Mild nonobstructive coronary artery disease  Recommendations: Supportive care. Case discussed with the critical care medicine team. Also discussed at length with the patient's family.  Tonny Bollman 10/24/2012, 4:21 PM

## 2012-10-24 NOTE — Progress Notes (Signed)
10/24/2012- Respiratory care note- A-line placed at 1815 in rt radial.  Positive pulses noted priot to placement.  Sterile technique used as per policy.  Line placed with one attempt.  Good wave form noted post placement.  Site dressed as per policy.  No complications noted.

## 2012-10-24 NOTE — Progress Notes (Signed)
Page for Code STEMI came at 1:55 pm. Pt didn't arrive until 3:17 and was taken directly to Cath Lab. EMS worker told me that pt's daughter-in-law would be coming in about 15 minutes. Could not locate family until 4:00 when I found them in Short Stay waiting area. Dr. Excell Seltzer was updating them on pt's condition. Family relieved that no blockage was found. Dr. Excell Seltzer said fluid probably due to congestive heart failure. Pt being moved to 2900. I relocated family to 2900 waiting area and informed staff of their presence.

## 2012-10-25 ENCOUNTER — Inpatient Hospital Stay (HOSPITAL_COMMUNITY): Payer: Medicare Other

## 2012-10-25 ENCOUNTER — Encounter (HOSPITAL_COMMUNITY): Payer: Self-pay | Admitting: Emergency Medicine

## 2012-10-25 DIAGNOSIS — I5181 Takotsubo syndrome: Secondary | ICD-10-CM | POA: Diagnosis present

## 2012-10-25 DIAGNOSIS — J9600 Acute respiratory failure, unspecified whether with hypoxia or hypercapnia: Principal | ICD-10-CM

## 2012-10-25 LAB — CBC
MCHC: 34.4 g/dL (ref 30.0–36.0)
MCV: 93.8 fL (ref 78.0–100.0)
Platelets: 191 10*3/uL (ref 150–400)
RDW: 14.2 % (ref 11.5–15.5)
WBC: 15.6 10*3/uL — ABNORMAL HIGH (ref 4.0–10.5)

## 2012-10-25 LAB — BASIC METABOLIC PANEL
Chloride: 103 mEq/L (ref 96–112)
Creatinine, Ser: 0.93 mg/dL (ref 0.50–1.10)
GFR calc Af Amer: 62 mL/min — ABNORMAL LOW (ref >90)
GFR calc non Af Amer: 53 mL/min — ABNORMAL LOW (ref >90)
Potassium: 4.5 mEq/L (ref 3.5–5.1)

## 2012-10-25 LAB — LIPID PANEL
HDL: 66 mg/dL (ref >39)
LDL Cholesterol: 27 mg/dL (ref 0–99)
Total CHOL/HDL Ratio: 1.6 RATIO
Triglycerides: 53 mg/dL (ref <150)

## 2012-10-25 LAB — BLOOD GAS, ARTERIAL
Acid-base deficit: 4.3 mmol/L — ABNORMAL HIGH (ref 0.0–2.0)
Bicarbonate: 20.6 mEq/L (ref 20.0–24.0)
FIO2: 0.4 %
O2 Saturation: 97.8 %
TCO2: 21.8 mmol/L (ref 0–100)
pO2, Arterial: 83.6 mmHg (ref 80.0–100.0)

## 2012-10-25 LAB — GLUCOSE, CAPILLARY
Glucose-Capillary: 166 mg/dL — ABNORMAL HIGH (ref 70–99)
Glucose-Capillary: 182 mg/dL — ABNORMAL HIGH (ref 70–99)
Glucose-Capillary: 206 mg/dL — ABNORMAL HIGH (ref 70–99)
Glucose-Capillary: 99 mg/dL (ref 70–99)

## 2012-10-25 MED ORDER — CEFTRIAXONE SODIUM 1 G IJ SOLR
1.0000 g | INTRAMUSCULAR | Status: DC
  Administered 2012-10-25 – 2012-10-28 (×4): 1 g via INTRAVENOUS
  Filled 2012-10-25 (×4): qty 10

## 2012-10-25 MED ORDER — LEVOFLOXACIN IN D5W 750 MG/150ML IV SOLN
750.0000 mg | INTRAVENOUS | Status: DC
  Administered 2012-10-25: 750 mg via INTRAVENOUS
  Filled 2012-10-25: qty 150

## 2012-10-25 MED ORDER — SODIUM CHLORIDE 0.9 % IV SOLN
INTRAVENOUS | Status: DC
  Administered 2012-10-25: 18:00:00 via INTRAVENOUS

## 2012-10-25 MED ORDER — CHLORHEXIDINE GLUCONATE CLOTH 2 % EX PADS
6.0000 | MEDICATED_PAD | Freq: Every day | CUTANEOUS | Status: AC
  Administered 2012-10-26 – 2012-10-30 (×5): 6 via TOPICAL

## 2012-10-25 MED ORDER — MUPIROCIN 2 % EX OINT
1.0000 "application " | TOPICAL_OINTMENT | Freq: Two times a day (BID) | CUTANEOUS | Status: AC
  Administered 2012-10-25 – 2012-10-30 (×10): 1 via NASAL
  Filled 2012-10-25: qty 22

## 2012-10-25 NOTE — Consult Note (Signed)
PULMONARY  / CRITICAL CARE MEDICINE  Name: [name] [name] MRN: [id number] DOB: November 28, 1922    ADMISSION DATE:  10/24/2012 CONSULTATION DATE:  10/24/2012  REFERRING MD :  Excell Seltzer PRIMARY SERVICE: Cardiology  CHIEF COMPLAINT:  Acute respiratory failure  BRIEF PATIENT DESCRIPTION: 79 y/o female with diverticulosis, COPD, and breast cancer was admitted to [facility] on 5/17 after acute respiratory failure.  She was intubated in the [facility] and brought to the [facility] cath lab where she was found to have tako-tsubo disease.   SIGNIFICANT EVENTS / STUDIES:  5/17 admitted> 5/17 LHC > Takotsubo syndrome with severe LV systolic dysfunction, mild non-obstructive CAD 5/18- remains in shock  LINES / TUBES: 5/17 ETT >> 5/17 L IJ CVL >>  CULTURES: BC 5/17>>> 5/17 sputum>>>  ANTIBIOTICS: Levo 5/18>>> ceftraixone 5/18>>> vanc 5/28>>>   SUBJECTIVE: remains on levophed  VITAL SIGNS: Temp:  [98.4 F (36.9 C)-99.3 F (37.4 C)] 99.3 F (37.4 C) (05/18 0800) Pulse Rate:  [68-158] 92 (05/18 1006) Resp:  [0-24] 19 (05/18 1006) BP: (85-106)/(45-68) 96/46 mmHg (05/18 1006) SpO2:  [98 %-100 %] 98 % (05/18 1006) Arterial Line BP: (101-149)/(34-60) 132/51 mmHg (05/18 1000) FiO2 (%):  [40 %-100 %] 40 % (05/18 1006) Weight:  [164 lb 14.5 oz (74.8 kg)-167 lb 1.7 oz (75.8 kg)] 167 lb 1.7 oz (75.8 kg) (05/18 0500) HEMODYNAMICS: CVP:  [6 mmHg-11 mmHg] 6 mmHg VENTILATOR SETTINGS: Vent Mode:  [-] CPAP;PSV FiO2 (%):  [40 %-100 %] 40 % Set Rate:  [16 bmp] 16 bmp Vt Set:  [480 mL] 480 mL PEEP:  [5 cmH20] 5 cmH20 Pressure Support:  [5 cmH20] 5 cmH20 Plateau Pressure:  [18 cmH20-20 cmH20] 18 cmH20 INTAKE / OUTPUT: Intake/Output     05/17 0701 - 05/18 0700 05/18 0701 - 05/19 0700   I.V. (mL/kg) 1075.6 (14.2)    Total Intake(mL/kg) 1075.6 (14.2)    Urine (mL/kg/hr) 540    Total Output 540     Net +535.6            PHYSICAL EXAMINATION:  Gen: sedated on vent rass 0 HEENT: NCAT, PERRL, ETT in place PULM: rhonchi  and rales bilaterally L > R, same CV: RRR, no mgr, no JVD AB: BS infrequent, soft, nontender, no hsm Ext: warm, no edema Derm: no rash or skin breakdown Neuro: sedated on vent, RASS 0  LABS:  Recent Labs Lab 10/24/12 1700 10/24/12 1800 10/24/12 1836 10/25/12 0040 10/25/12 0435 10/25/12 0449 10/25/12 0450  HGB 14.0  --   --   --   --   --  12.5  WBC 11.1*  --   --   --   --   --  15.6*  PLT 209  --   --   --   --   --  191  NA 134*  --   --   --   --   --  134*  K 4.3  --   --   --   --   --  4.5  CL 101  --   --   --   --   --  103  CO2 19  --   --   --   --   --  19  GLUCOSE 253*  --   --   --   --   --  218*  BUN 14  --   --   --   --   --  16  CREATININE 0.92  --   --   --   --   --  0.93  CALCIUM 8.2*  --   --   --   --   --  7.8*  MG 1.5  --   --   --   --   --   --   AST 87*  --   --   --   --   --   --   ALT 53*  --   --   --   --   --   --   ALKPHOS 40  --   --   --   --   --   --   BILITOT 0.4  --   --   --   --   --   --   PROT 6.3  --   --   --   --   --   --   ALBUMIN 3.0*  --   --   --   --   --   --   INR 1.11  --   --   --   --   --   --   LATICACIDVEN  --  3.1*  --   --   --   --   --   TROPONINI 8.67*  --   --  9.36* 9.57*  --   --   PROBNP 8155.0*  --   --   --   --   --   --   PHART  --   --  7.256*  --   --  7.335*  --   PCO2ART  --   --  44.4  --   --  39.7  --   PO2ART  --   --  161.0*  --   --  83.6  --     Recent Labs Lab 10/24/12 1643 10/24/12 2049 10/24/12 2353 10/25/12 0401  GLUCAP 198* 236* 182* 206*    517 CXR: extensive rt infiltrates reticual, int changes, ett wnl  ASSESSMENT / PLAN:  PULMONARY A: Acute respiratory failure from acute pulmonary edema COPD RUL infiltrate > edema? CAP? cvp supports PNA P:   -pressors lower, SBT planned, cpap 5 ps 5, goal 2 hrs -may need CT chest to eval infiltrates -pcxr in am  -last ABg reviewed, keep same MV -lasix per cards  CARDIOVASCULAR A: Cardiogenic shock from Takotsubo, shock,  r/o septic / cardiogenic imrpoved  P:  -levophed to MAp goal -cortisol if drops BP further -diuresis per cardiology  RENAL A:  No acute issues P:   -bmet in am  Lasix per cards  GASTROINTESTINAL A:  Diverticulosis> not in flare P:   -pantoprazole for stress ulcer prophylaxis Start TF  If remains vented  HEMATOLOGIC A:  No sign of fever, but atypical infiltrate worrisome for pneumonia P:  -monitor for bleeding -coags wnl, hep sub q   INFECTIOUS A:  Concern PNA P:  -remained on levo this am , add vanc -add cap coverage -sputum Leg urine ag strep  ENDOCRINE A:  No acute issues P:   -monitor glucose  NEUROLOGIC A:  Sedation needs on vent P:   -fentanyl gtt and prn versed titrated to RASS 0   TODAY'S SUMMARY:   I have personally obtained a history, examined the patient, evaluated laboratory and imaging results, formulated the assessment and plan and placed orders. CRITICAL CARE: The patient is critically ill with multiple organ systems failure and requires high complexity decision making for assessment and support, frequent evaluation and titration of therapies, application  of advanced monitoring technologies and extensive interpretation of multiple databases. Critical Care Time devoted to patient care services described in this note is 35 minutes.   Rory Percy J.,MD Pulmonary and Critical Care Medicine [facility] Pager: [phone]  10/25/2012, 10:25 AM

## 2012-10-25 NOTE — Progress Notes (Signed)
PROGRESS NOTE  Subjective:   [name] [name] is an 79 yo with STEMI , transferred from [facility] yesterday.  Was found to have Takotsubo syndrome.  Still on vent. Awake and alert. No pain.  Objective:    Vital Signs:   Temp:  [98.4 F (36.9 C)-99.3 F (37.4 C)] 99.3 F (37.4 C) (05/18 0800) Pulse Rate:  [68-158] 76 (05/18 0845) Resp:  [0-24] 19 (05/18 0845) BP: (85-106)/(45-68) 106/52 mmHg (05/18 0845) SpO2:  [99 %-100 %] 100 % (05/18 0845) Arterial Line BP: (101-148)/(34-60) 148/60 mmHg (05/18 0845) FiO2 (%):  [40 %-100 %] 40 % (05/18 0845) Weight:  [164 lb 14.5 oz (74.8 kg)-167 lb 1.7 oz (75.8 kg)] 167 lb 1.7 oz (75.8 kg) (05/18 0500)      24-hour weight change: Weight change:   Weight trends: Filed Weights   10/24/12 1900 10/25/12 0500  Weight: 164 lb 14.5 oz (74.8 kg) 167 lb 1.7 oz (75.8 kg)    Intake/Output:  05/17 0701 - 05/18 0700 In: 1075.6 [I.V.:1075.6] Out: 540 [Urine:540]     Physical Exam: BP 106/52  Pulse 76  Temp(Src) 99.3 F (37.4 C) (Oral)  Resp 19  Ht 5\' 6"  (1.676 m)  Wt 167 lb 1.7 oz (75.8 kg)  BMI 26.98 kg/m2  SpO2 100%  General: Vital signs reviewed and noted.   Head: Normocephalic, atraumatic.  Eyes: conjunctivae/corneas clear.  EOM's intact.   Throat: normal  Neck:  normal  Lungs:    clear, on vent  Heart:  RR, normal S1, S2  Abdomen:  Soft, non-tender, non-distended    Extremities: No edema   Neurologic: A&O X3, CN II - XII are grossly intact.   Psych: Normal     Labs: BMET:  Recent Labs  10/24/12 1700 10/25/12 0450  NA 134* 134*  K 4.3 4.5  CL 101 103  CO2 19 19  GLUCOSE 253* 218*  BUN 14 16  CREATININE 0.92 0.93  CALCIUM 8.2* 7.8*  MG 1.5  --     Liver function tests:  Recent Labs  10/24/12 1700  AST 87*  ALT 53*  ALKPHOS 40  BILITOT 0.4  PROT 6.3  ALBUMIN 3.0*   No results found for this basename: LIPASE, AMYLASE,  in the last 72 hours  CBC:  Recent Labs  10/24/12 1700 10/25/12 0450  WBC 11.1* 15.6*   NEUTROABS 9.5*  --   HGB 14.0 12.5  HCT 40.8 36.3  MCV 95.1 93.8  PLT 209 191    Cardiac Enzymes:  Recent Labs  10/24/12 1700 10/25/12 0040 10/25/12 0435  TROPONINI 8.67* 9.36* 9.57*    Coagulation Studies:  Recent Labs  10/24/12 1700  LABPROT 14.2  INR 1.11    Other: No components found with this basename: POCBNP,  No results found for this basename: DDIMER,  in the last 72 hours No results found for this basename: HGBA1C,  in the last 72 hours  Recent Labs  10/25/12 0449  CHOL 104  HDL 66  LDLCALC 27  TRIG 53  CHOLHDL 1.6    Recent Labs  10/24/12 1700  TSH 0.952   No results found for this basename: VITAMINB12, FOLATE, FERRITIN, TIBC, IRON, RETICCTPCT,  in the last 72 hours   Other results:  EKG:  Poor R wave progression.   Medications:    Infusions: . fentaNYL infusion INTRAVENOUS Stopped (10/25/12 0830)  . norepinephrine (LEVOPHED) Adult infusion 10 mcg/min (10/25/12 1191)    Scheduled Medications: . antiseptic oral rinse  15 mL  Mouth Rinse QID  . aspirin  81 mg Oral Daily  . chlorhexidine  15 mL Mouth Rinse BID  . heparin  5,000 Units Subcutaneous Q8H  . pantoprazole (PROTONIX) IV  40 mg Intravenous QHS    Assessment/ Plan:    1. Takotsubo syndrome:  Doing better this am.  Weaning norepinephrine this am.  Will try to add in metoprolol soon - ? Tomorrow.   Possible extubation today.  Will keep in ICU today. Possible transfer tomorrow.     Disposition:  Length of Stay: 1  Vesta Mixer, Montez Hageman., MD, [facility] 10/25/2012, 9:18 AM Office [phone] Pager [phone]

## 2012-10-25 NOTE — Progress Notes (Signed)
Wasted 150mg  fentanyl in sink. Witness Nettie Elm Pyland,rn

## 2012-10-25 NOTE — Progress Notes (Signed)
10/25/2012- Respiratory care note- Pt weaned and extubated at 1100 this am.  Pt sats 99% on 4lpm cannula, hr 92, rr22, bp 104/58.  Pt tolerated extubation well with pt suctioned prior to extubation and pt vocalizing post extubation.  RN at bedside for procedure.  Will wean oxygen as tolerated.

## 2012-10-25 NOTE — Evaluation (Signed)
Clinical/Bedside Swallow Evaluation Patient Details  Name: [name] [name] MRN: [id number] Date of Birth: 19-May-1923  Today's Date: 10/25/2012 Time: 9147-8295 SLP Time Calculation (min): 30 min  Past Medical History:  Past Medical History  Diagnosis Date  . HTN (hypertension)   . COPD (chronic obstructive pulmonary disease)   . Diverticulosis   . Breast CA    Past Surgical History:  Past Surgical History  Procedure Laterality Date  . Mastectomy    . Cataract extraction     HPI:  79 y/o female with diverticulosis, COPD, and breast cancer was admitted to [facility] on 5/17 after acute respiratory failure.  She was intubated in the [facility] and brought to the [facility] cath lab where she was found to have tako-tsubo disease.  BSE indicated s/p extubation to assess risk of aspiration   Assessment / Plan / Recommendation Clinical Impression  Oropharyngeal dysphagia indicated mainly due to overall weakness and decreased reserve.  Multiple swallows required for trials of thin water even with modified amounts indicating pharyngeal weakness.  Immediate throat clears s/p swallow ot thin liquid and nectar thick liquids indicating penetration.  Patient reported fatigue after PO trials of liquids.   Tolerated puree consistency in small amounts.  Due to s/s present and decreased endurance recommend continued NPO with exception of medication administered in puree.  Recommend ice chips sparingly only after oral care with supervision.  ST to reassess swallow bedside for PO readiness on 10/26/12.      Aspiration Risk  Moderate    Diet Recommendation NPO   Medication Administration: Crushed with puree    Other  Recommendations Oral Care Recommendations: Oral care QID   Follow Up Recommendations  Inpatient Rehab    Frequency and Duration min 2x/week  2 weeks   Pertinent Vitals/Pain RR Increased from lower 20's to upper 20's s/p thin liquid trials    SLP Swallow Goals Goal #3: Consume diagnostic PO trials of  various consistencies with no outward s/s of aspiration with min verbal cues to complete swallow strategies   Swallow Study Prior Functional Status    Lived at home with no prior history of dysphagia     General Date of Onset: 10/24/12 HPI: 79 y/o female with diverticulosis, COPD, and breast cancer was admitted to [facility] on 5/17 after acute respiratory failure.  She was intubated in the [facility] and brought to the [facility] cath lab where she was found to have tako-tsubo disease.   Type of Study: Bedside swallow evaluation Diet Prior to this Study: NPO;Panda Temperature Spikes Noted: No Respiratory Status: Supplemental O2 delivered via (comment) (nasal cannula) History of Recent Intubation: Yes Length of Intubations (days): 1 days Date extubated: 10/25/12 Behavior/Cognition: Alert;Cooperative;Pleasant mood Oral Cavity - Dentition: Dentures, not available Self-Feeding Abilities: Needs assist;Able to feed self Patient Positioning: Upright in bed Baseline Vocal Quality: Hoarse Volitional Cough: Strong Volitional Swallow: Able to elicit    Oral/Motor/Sensory Function Overall Oral Motor/Sensory Function: Appears within functional limits for tasks assessed   Ice Chips Ice chips: Impaired Pharyngeal Phase Impairments: Suspected delayed Swallow;Decreased hyoid-laryngeal movement;Throat Clearing - Immediate;Wet Vocal Quality   Thin Liquid Thin Liquid: Impaired Presentation: Cup;Spoon Pharyngeal  Phase Impairments: Suspected delayed Swallow;Decreased hyoid-laryngeal movement;Throat Clearing - Immediate;Multiple swallows;Wet Vocal Quality    Nectar Thick Nectar Thick Liquid: Impaired Presentation: Spoon Pharyngeal Phase Impairments: Suspected delayed Swallow;Decreased hyoid-laryngeal movement;Throat Clearing - Immediate   Honey Thick Honey Thick Liquid: Not tested   Puree Puree: Impaired Pharyngeal Phase Impairments: Suspected delayed Swallow;Decreased hyoid-laryngeal movement;Throat Clearing - Delayed  Solid   GO    Solid: Impaired Pharyngeal Phase Impairments: Suspected delayed Swallow;Decreased hyoid-laryngeal movement      Moreen Fowler MS, CCC-SLP [phone] [facility] 10/25/2012,6:57 PM

## 2012-10-25 NOTE — Progress Notes (Signed)
Utilization review completed.  

## 2012-10-26 ENCOUNTER — Inpatient Hospital Stay (HOSPITAL_COMMUNITY): Payer: Medicare Other

## 2012-10-26 DIAGNOSIS — I369 Nonrheumatic tricuspid valve disorder, unspecified: Secondary | ICD-10-CM

## 2012-10-26 DIAGNOSIS — J189 Pneumonia, unspecified organism: Secondary | ICD-10-CM | POA: Diagnosis present

## 2012-10-26 LAB — COMPREHENSIVE METABOLIC PANEL
Albumin: 2.4 g/dL — ABNORMAL LOW (ref 3.5–5.2)
Alkaline Phosphatase: 30 U/L — ABNORMAL LOW (ref 39–117)
BUN: 15 mg/dL (ref 6–23)
Creatinine, Ser: 0.85 mg/dL (ref 0.50–1.10)
GFR calc Af Amer: 69 mL/min — ABNORMAL LOW (ref >90)
Glucose, Bld: 116 mg/dL — ABNORMAL HIGH (ref 70–99)
Total Protein: 5.4 g/dL — ABNORMAL LOW (ref 6.0–8.3)

## 2012-10-26 LAB — CBC WITH DIFFERENTIAL/PLATELET
Basophils Absolute: 0 10*3/uL (ref 0.0–0.1)
Eosinophils Relative: 0 % (ref 0–5)
HCT: 30.4 % — ABNORMAL LOW (ref 36.0–46.0)
Lymphocytes Relative: 14 % (ref 12–46)
Lymphs Abs: 1.3 10*3/uL (ref 0.7–4.0)
MCV: 94.4 fL (ref 78.0–100.0)
Neutro Abs: 7.9 10*3/uL — ABNORMAL HIGH (ref 1.7–7.7)
Platelets: 128 10*3/uL — ABNORMAL LOW (ref 150–400)
RBC: 3.22 MIL/uL — ABNORMAL LOW (ref 3.87–5.11)
RDW: 14.7 % (ref 11.5–15.5)
WBC: 9.8 10*3/uL (ref 4.0–10.5)

## 2012-10-26 LAB — GLUCOSE, CAPILLARY
Glucose-Capillary: 115 mg/dL — ABNORMAL HIGH (ref 70–99)
Glucose-Capillary: 119 mg/dL — ABNORMAL HIGH (ref 70–99)

## 2012-10-26 MED ORDER — PANTOPRAZOLE SODIUM 40 MG PO TBEC
40.0000 mg | DELAYED_RELEASE_TABLET | Freq: Every day | ORAL | Status: DC
  Administered 2012-10-26 – 2012-11-02 (×8): 40 mg via ORAL
  Filled 2012-10-26 (×8): qty 1

## 2012-10-26 MED ORDER — LISINOPRIL 2.5 MG PO TABS
2.5000 mg | ORAL_TABLET | Freq: Every day | ORAL | Status: DC
  Administered 2012-10-26: 2.5 mg via ORAL
  Filled 2012-10-26 (×2): qty 1

## 2012-10-26 MED ORDER — LEVOFLOXACIN 750 MG PO TABS
750.0000 mg | ORAL_TABLET | ORAL | Status: DC
  Administered 2012-10-27 – 2012-10-29 (×2): 750 mg via ORAL
  Filled 2012-10-26 (×2): qty 1

## 2012-10-26 MED ORDER — FUROSEMIDE 20 MG PO TABS
20.0000 mg | ORAL_TABLET | Freq: Every day | ORAL | Status: DC
  Administered 2012-10-26: 20 mg via ORAL
  Filled 2012-10-26 (×2): qty 1

## 2012-10-26 NOTE — Progress Notes (Signed)
PT Cancellation Note  Patient Details Name: Yamileth Hayse MRN: [id number] DOB: 02-Apr-1923   Cancelled Treatment:    Reason Eval/Treat Not Completed: Fatigue/lethargy limiting ability to participate. Pt politely declined due to "I am so worn out from my swallow test." Per RN pt has been up in the chair all day and then just returned from a MBE. PT to return when able.   Marcene Brawn 10/26/2012, 3:01 PM

## 2012-10-26 NOTE — Progress Notes (Signed)
PULMONARY  / CRITICAL CARE MEDICINE  Name: [name] [name] MRN: [id number] DOB: [birth year]    ADMISSION DATE:  10/24/2012 CONSULTATION DATE:  10/24/2012  REFERRING MD :  Excell Seltzer PRIMARY SERVICE: Cardiology  CHIEF COMPLAINT:  Acute respiratory failure  BRIEF PATIENT DESCRIPTION: 79 y/o female with diverticulosis, COPD, and breast cancer was admitted to [facility] on 5/17 after acute respiratory failure.  She was intubated in the [facility] and brought to the [facility] cath lab where she was found to have tako-tsubo disease.  She reports sudden onset symptoms overnight causing resp failure She reports pneumonia in dec & jan & severe GERD  SIGNIFICANT EVENTS / STUDIES:  5/17 admitted> 5/17 LHC > Takotsubo syndrome with severe LV systolic dysfunction, mild non-obstructive CAD 5/18- remains in shock  LINES / TUBES: 5/17 ETT >>5/18 5/17 L IJ CVL >>5/19  CULTURES: BC 5/17>>>  5/17 sputum>>> Urine strep ag neg Urine leg ag >>  ANTIBIOTICS: Levo 5/18>>> ceftraixone 5/18>>> vanc 5/28>>>   SUBJECTIVE: Off levophed, extubated oob to chair having breakfast Denies pain c/o heartburn  VITAL SIGNS: Temp:  [97.8 F (36.6 C)-98.2 F (36.8 C)] 97.8 F (36.6 C) (05/19 0800) Pulse Rate:  [87-117] 101 (05/19 0800) Resp:  [0-30] 22 (05/19 0800) BP: (83-105)/(41-62) 103/43 mmHg (05/19 0800) SpO2:  [93 %-98 %] 97 % (05/19 0800) Arterial Line BP: (115-153)/(41-67) 128/45 mmHg (05/18 1500) FiO2 (%):  [40 %] 40 % (05/18 1006) Weight:  [74.7 kg (164 lb 10.9 oz)] 74.7 kg (164 lb 10.9 oz) (05/19 0430) HEMODYNAMICS: CVP:  [3 mmHg-8 mmHg] 5 mmHg VENTILATOR SETTINGS: Vent Mode:  [-] CPAP;PSV FiO2 (%):  [40 %] 40 % PEEP:  [5 cmH20] 5 cmH20 Pressure Support:  [5 cmH20] 5 cmH20 INTAKE / OUTPUT: Intake/Output     05/18 0701 - 05/19 0700 05/19 0701 - 05/20 0700   I.V. (mL/kg) 290 (3.9) 20 (0.3)   IV Piggyback 200    Total Intake(mL/kg) 490 (6.6) 20 (0.3)   Urine (mL/kg/hr) 1100 (0.6)    Total Output 1100     Net  -610 +20        Stool Occurrence 1 x      PHYSICAL EXAMINATION:  Gen: acutely ill HEENT: NCAT, PERRL PULM: rhonchi and rales bilaterally L > R -much improved CV: RRR, no mgr, no JVD AB: BS infrequent, soft, nontender, no hsm Ext: warm, no edema Derm: no rash or skin breakdown Neuro: non focal  LABS:  Recent Labs Lab 10/24/12 1700 10/24/12 1800 10/24/12 1836 10/25/12 0040 10/25/12 0435 10/25/12 0449 10/25/12 0450 10/25/12 1100 10/26/12 0400  HGB 14.0  --   --   --   --   --  12.5  --  10.3*  WBC 11.1*  --   --   --   --   --  15.6*  --  9.8  PLT 209  --   --   --   --   --  191  --  128*  NA 134*  --   --   --   --   --  134*  --  136  K 4.3  --   --   --   --   --  4.5  --  3.9  CL 101  --   --   --   --   --  103  --  102  CO2 19  --   --   --   --   --  19  --  23  GLUCOSE 253*  --   --   --   --   --  218*  --  116*  BUN 14  --   --   --   --   --  16  --  15  CREATININE 0.92  --   --   --   --   --  0.93  --  0.85  CALCIUM 8.2*  --   --   --   --   --  7.8*  --  8.2*  MG 1.5  --   --   --   --   --   --   --   --   AST 87*  --   --   --   --   --   --   --  40*  ALT 53*  --   --   --   --   --   --   --  27  ALKPHOS 40  --   --   --   --   --   --   --  30*  BILITOT 0.4  --   --   --   --   --   --   --  0.5  PROT 6.3  --   --   --   --   --   --   --  5.4*  ALBUMIN 3.0*  --   --   --   --   --   --   --  2.4*  INR 1.11  --   --   --   --   --   --   --   --   LATICACIDVEN  --  3.1*  --   --   --   --   --   --   --   TROPONINI 8.67*  --   --  9.36* 9.57*  --   --   --   --   PROCALCITON  --   --   --   --   --   --   --  20.01 11.62  PROBNP 8155.0*  --   --   --   --   --   --   --   --   PHART  --   --  7.256*  --   --  7.335*  --   --   --   PCO2ART  --   --  44.4  --   --  39.7  --   --   --   PO2ART  --   --  161.0*  --   --  83.6  --   --   --     Recent Labs Lab 10/25/12 1622 10/25/12 2053 10/25/12 2326 10/26/12 0446 10/26/12 0802  GLUCAP 89  89 99 75 115*    519 CXR: extensive rt infiltrates esp RUL, int changes  ASSESSMENT / PLAN:  PULMONARY A: Acute respiratory failure from acute pulmonary edema COPD RUL infiltrate >treat as CAP, note h/o recurrent pneumonia & GERD ? aspiration  P:  ba swallow eval   CARDIOVASCULAR A: Cardiogenic shock from Takotsubo, shock -improved  P:  --diuresis & lisinopril  per cardiology  RENAL A:  No acute issues P:    Lasix per cards  GASTROINTESTINAL A:  Diverticulosis> not in flare GERD P:   -pantoprazole -allow diet  HEMATOLOGIC A:  Thrombocytopenia -rapid drop P:  -  dc hep sub q if drops < 105k  INFECTIOUS A:  CAP vs aspiration/GERD P:  -ct cap coverage -dc vanc once cx neg -sputum Leg urine ag pending  ENDOCRINE A:  No acute issues P:   -monitor glucose   TODAY'S SUMMARY: Much improved, OK to dc CVL & transfer to tele   [facility] V.,MD Pulmonary and Critical Care Medicine [facility] Pager: [phone]  10/26/2012, 9:01 AM

## 2012-10-26 NOTE — Progress Notes (Signed)
Speech Language Pathology Dysphagia Treatment Patient Details Name: [name] [name] MRN: [id number] DOB: 02/24/1923 Today's Date: 10/26/2012 Time: 4098-1191 SLP Time Calculation (min): 15 min  Assessment / Plan / Recommendation Clinical Impression  Swallow status has improved since yesterday's BSE where pt. exhibiting s/s aspiration and SLP recommended continue NPO.  Yesterday MD ordered regular diet texture and pt. beginning to eat breakfast when SLP entered room..Today no s/s aspiration exhibited.  She consumed one bite bacon before declining and stated if was difficult without her dentures.  CCM MD also arrived as swallowing was discussed.  Pt. reported having history of GERD (takes reflux meds) and stated she ate pinto beans the night prior to admission and they "burned like fire", went home and immediately went to bed.  SLP discussed/explained esohageal precautions and rationale.  Recommend pt. continue regular texture diet and thin liquids.  Discussed with Dr. Vassie Loll and recommend further work up for esophageal function.  He ordered a barium esophagram for today.  No ST follow needed.      Diet Recommendation  Continue with Current Diet: Regular;Thin liquid    SLP Plan Continue with current plan of care   Pertinent Vitals/Pain none   Swallowing Goals  SLP Swallowing Goals Goal #3: Consume diagnostic PO trials of various consistencies with no outward s/s of aspiration with min verbal cues to complete swallow strategies Swallow Study Goal #3 - Progress: Met  General Temperature Spikes Noted: No Respiratory Status: Supplemental O2 delivered via (comment) Behavior/Cognition: Alert;Cooperative;Pleasant mood Oral Cavity - Dentition: Dentures, not available Patient Positioning: Upright in chair  Oral Cavity - Oral Hygiene Does patient have any of the following "at risk" factors?: Oxygen therapy - cannula, mask, simple oxygen devices Brush patient's teeth BID with toothbrush (using toothpaste  with fluoride): Yes Patient is AT RISK - Oral Care Protocol followed (see row info): Yes   Dysphagia Treatment Treatment focused on: Skilled observation of diet tolerance;Patient/family/caregiver education Treatment Methods/Modalities: Skilled observation;Differential diagnosis Patient observed directly with PO's: Yes Type of PO's observed: Regular;Thin liquids Feeding: Able to feed self Liquids provided via: Cup Type of cueing: Verbal Amount of cueing: Minimal   GO     Royce Macadamia M.Ed [employer] [phone] 10/26/2012

## 2012-10-26 NOTE — Progress Notes (Signed)
Echocardiogram 2D Echocardiogram has been performed.  [name] [name] 10/26/2012, 10:17 AM

## 2012-10-26 NOTE — Progress Notes (Signed)
79 yo female with hypertension, COPD, transferred from [facility] after presenting hypoxic, hypotensive, and responsive thought secondary to pneumonia. Pt subsequently intubated in ER. Initial EKGs w/o significant ST elevation. Follow-up EKGs with ST elevation V3-V6. Transferred for cardiac catheterization.  Subjective:  No acute events overnight, requesting to eat and have some coffee, reports feeling weak and would like to get into a chair  Objective:  Vital Signs in the last 24 hours: Temp:  [97.9 F (36.6 C)-99.3 F (37.4 C)] 98 F (36.7 C) (05/19 0400) Pulse Rate:  [73-116] 97 (05/19 0600) Resp:  [0-30] 18 (05/19 0600) BP: (83-106)/(41-62) 90/44 mmHg (05/19 0600) SpO2:  [93 %-100 %] 98 % (05/19 0400) Arterial Line BP: (113-153)/(41-67) 128/45 mmHg (05/18 1500) FiO2 (%):  [40 %] 40 % (05/18 1006) Weight:  [164 lb 10.9 oz (74.7 kg)] 164 lb 10.9 oz (74.7 kg) (05/19 0430)  Intake/Output from previous day: 05/18 0701 - 05/19 0700 In: 470 [I.V.:270; IV Piggyback:200] Out: 800 [Urine:800]  Physical Exam: General: Elderly white female, in no acute distress; HEENT: Normocephalic, atraumatic, edentulous Neck: supple, no masses, no carotid Bruits, no JVD appreciated. Lungs: Normal respiratory effort. Clear to auscultation bilaterally from apices to bases without crackles or wheezes appreciated. Heart: normal rate, regular rhythm, normal S1 and S2, no gallop, murmur, or rubs appreciated. Abdomen: BS normoactive. Soft, Nondistended, non-tender. No masses or organomegaly appreciated. Extremities: No pretibial edema, distal pulses intact, large ecchymosisis to bilateral shin area Neurologic: grossly non-focal, alert and oriented, appropriate and cooperative throughout examination.    Lab Results:  Recent Labs  10/25/12 0450 10/26/12 0400  WBC 15.6* 9.8  HGB 12.5 10.3*  PLT 191 128*    Recent Labs  10/25/12 0450 10/26/12 0400  NA 134* 136  K 4.5 3.9    CL 103 102  CO2 19 23  GLUCOSE 218* 116*  BUN 16 15  CREATININE 0.93 0.85    Recent Labs  10/25/12 0040 10/25/12 0435  TROPONINI 9.36* 9.57*    Cardiac Studies: 10/24/2012 Coronary angiography:  Coronary dominance: right  Left mainstem: Widely patent with very mild 20% ostial stenosis. There is no significant calcification.  Left anterior descending (LAD): The LAD is patent to the left ventricular apex. The distal flow pattern in the LAD demonstrates mildly slow flow. There is diffuse irregularity but essentially the vessel is widely patent throughout. The diagonals are patent with mild ostial stenosis. There are no severe flow limiting stenoses throughout the course of the LAD distribution. The vessel wraps around the left ventricular apex.  Left circumflex (LCx): The left circumflex is also patent throughout. There is minor irregularity. There were 2 obtuse marginal branches with no significant stenoses. The left posterolateral branch is patent without significant stenosis.  Right coronary artery (RCA): The right coronary artery is a large, dominant vessel. The vessel is smooth throughout its course. The PDA and PLA branches are both large without significant stenoses the  Left ventriculography: Left ventricular function is markedly depressed. The ventricle has a classic appearance of Takotsubo cardiomyopathy. The anterior base and inferior base of the heart are dynamic. The entire anterolateral, periapical, and inferoapical/mid inferior walls are akinetic. The estimated left ventricular ejection fraction is 20%. There is no appreciable mitral regurgitation.   Tele: NSR  Assessment/Plan:  79 yo admitted with clinical picture c/w Acute Coronary Syndrome in setting of likely pneumonia found to have Takotsubo Cardiomyopathy on cardiac catheterization.  Acute cardiogenic shock: secondary to Takotsubo syndrome, improved hemodynamics, slightly  tachycardic today, bp 90/44   Severe left  ventricular systolic dysfunction: EF 20%, entire anterolateral, periapical, and inferoapical/mid inferior walls are akinetic.   ST elevation on EKG with elevated troponins: secondary to Takotsubo Cardiomyopathy  Recent Labs Lab 10/24/12 1700 10/25/12 0040 10/25/12 0435  TROPONINI 8.67* 9.36* 9.57*    Nonobstructive CAD, mild   Acute respiratory failure: likely secondary to pneumonia, Now extubated  Recent Labs Lab 10/24/12 1836 10/25/12 0449  PHART 7.256* 7.335*  PCO2ART 44.4 39.7  PO2ART 161.0* 83.6  HCO3 19.7* 20.6  TCO2 21 21.8  O2SAT 99.0 97.8    Community Acquired Pneumonia, probable: persistent RLL infiltrate on CXR this morning, on Ceftriaxone and levaquin leukocytosis resolved  Recent Labs Lab 10/24/12 1700 10/25/12 0450 10/26/12 0400  WBC 11.1* 15.6* 9.8     Continue supportive therapy.  Pt hemodynamically stable-->Consider initiation of metoprolol as permitted by bp. Transfer out of CCU to SDU. SHe will need close follow-up with a cardiologist in the following weeks to ensure resolution of the cardiomyopathy with serial ECHOs.  Kristie Cowman, M.D. 10/26/2012, 6:22 AM  Patient examined chart reviewed. Frail female.  LIves independantly.  Normal cors with TakaTsubo DCM EF 25%.  CXR looks bad with ? Bilobar pneumonia. Would appreciate CCM continued following for respitory status and antibiotics.  Lasix if I's greater than O's by 250cc.  Try to add low dose ACE if systolic BP stays over 90 mmHg  Charlton Haws

## 2012-10-27 DIAGNOSIS — J189 Pneumonia, unspecified organism: Secondary | ICD-10-CM

## 2012-10-27 LAB — GLUCOSE, CAPILLARY: Glucose-Capillary: 122 mg/dL — ABNORMAL HIGH (ref 70–99)

## 2012-10-27 LAB — BASIC METABOLIC PANEL
CO2: 24 mEq/L (ref 19–32)
Calcium: 9 mg/dL (ref 8.4–10.5)
Chloride: 99 mEq/L (ref 96–112)
Potassium: 3.7 mEq/L (ref 3.5–5.1)
Sodium: 135 mEq/L (ref 135–145)

## 2012-10-27 LAB — CBC
MCV: 94.2 fL (ref 78.0–100.0)
Platelets: 156 10*3/uL (ref 150–400)
RBC: 3.42 MIL/uL — ABNORMAL LOW (ref 3.87–5.11)
WBC: 12.1 10*3/uL — ABNORMAL HIGH (ref 4.0–10.5)

## 2012-10-27 LAB — PROCALCITONIN: Procalcitonin: 6.9 ng/mL

## 2012-10-27 MED ORDER — VITAMIN B-12 1000 MCG PO TABS
1000.0000 ug | ORAL_TABLET | Freq: Every day | ORAL | Status: DC
  Administered 2012-10-27 – 2012-11-06 (×11): 1000 ug via ORAL
  Filled 2012-10-27 (×11): qty 1

## 2012-10-27 MED ORDER — TIOTROPIUM BROMIDE MONOHYDRATE 18 MCG IN CAPS
18.0000 ug | ORAL_CAPSULE | Freq: Every day | RESPIRATORY_TRACT | Status: DC
  Administered 2012-10-28 – 2012-10-31 (×4): 18 ug via RESPIRATORY_TRACT
  Filled 2012-10-27: qty 5

## 2012-10-27 MED ORDER — VITAMIN C 500 MG PO TABS
500.0000 mg | ORAL_TABLET | Freq: Every day | ORAL | Status: DC
  Administered 2012-10-27 – 2012-11-06 (×11): 500 mg via ORAL
  Filled 2012-10-27 (×11): qty 1

## 2012-10-27 MED ORDER — CARVEDILOL 3.125 MG PO TABS
3.1250 mg | ORAL_TABLET | Freq: Two times a day (BID) | ORAL | Status: DC
  Administered 2012-10-27 – 2012-10-31 (×9): 3.125 mg via ORAL
  Filled 2012-10-27 (×12): qty 1

## 2012-10-27 MED ORDER — FUROSEMIDE 10 MG/ML IJ SOLN
40.0000 mg | Freq: Two times a day (BID) | INTRAMUSCULAR | Status: DC
  Administered 2012-10-27 – 2012-10-31 (×9): 40 mg via INTRAVENOUS
  Filled 2012-10-27 (×12): qty 4

## 2012-10-27 MED ORDER — LISINOPRIL 2.5 MG PO TABS
2.5000 mg | ORAL_TABLET | Freq: Every day | ORAL | Status: DC
  Administered 2012-10-27 – 2012-10-31 (×5): 2.5 mg via ORAL
  Filled 2012-10-27 (×6): qty 1

## 2012-10-27 MED ORDER — LISINOPRIL 5 MG PO TABS
5.0000 mg | ORAL_TABLET | Freq: Every day | ORAL | Status: DC
  Filled 2012-10-27: qty 1

## 2012-10-27 MED ORDER — TIOTROPIUM BROMIDE MONOHYDRATE 18 MCG IN CAPS
18.0000 ug | ORAL_CAPSULE | Freq: Every day | RESPIRATORY_TRACT | Status: DC
  Filled 2012-10-27: qty 5

## 2012-10-27 NOTE — Progress Notes (Signed)
PULMONARY  / CRITICAL CARE MEDICINE  Name: [name] [name] MRN: [id number] DOB: [birth year]    ADMISSION DATE:  10/24/2012 CONSULTATION DATE:  10/24/2012  REFERRING MD :  Excell Seltzer PRIMARY SERVICE: Cardiology  CHIEF COMPLAINT:  Acute respiratory failure  BRIEF PATIENT DESCRIPTION: 79 y/o female with diverticulosis, COPD, and breast cancer was admitted to [facility] on 5/17 after acute respiratory failure.  She was intubated in the [facility] and brought to the [facility] cath lab where she was found to have tako-tsubo disease.  She reports sudden onset symptoms overnight causing resp failure She reports pneumonia in dec & jan & severe GERD  SIGNIFICANT EVENTS / STUDIES:  5/17 admitted> 5/17 LHC > Takotsubo syndrome with severe LV systolic dysfunction, mild non-obstructive CAD 5/18- remains in shock  LINES / TUBES: 5/17 ETT >>5/18 5/17 L IJ CVL >>5/19  CULTURES: BC 5/17>>> ng 5/17 sputum>>>ng Urine strep ag neg Urine leg ag >>  ANTIBIOTICS: Levo 5/18>>> ceftraixone 5/18>>> vanc 5/28>>>   SUBJECTIVE:  oob to chair having breakfast Denies pain c/o heartburn  VITAL SIGNS: Temp:  [97 F (36.1 C)-98.9 F (37.2 C)] 97 F (36.1 C) (05/20 0400) Pulse Rate:  [92-108] 100 (05/19 1500) Resp:  [23-31] 23 (05/20 0400) BP: (99-124)/(45-66) 118/66 mmHg (05/20 0400) SpO2:  [95 %-98 %] 95 % (05/20 0400) Weight:  [73.5 kg (162 lb 0.6 oz)] 73.5 kg (162 lb 0.6 oz) (05/20 0500) HEMODYNAMICS:   VENTILATOR SETTINGS:   INTAKE / OUTPUT: Intake/Output     05/19 0701 - 05/20 0700 05/20 0701 - 05/21 0700   P.O. 840    I.V. (mL/kg) 120 (1.6)    IV Piggyback 50    Total Intake(mL/kg) 1010 (13.7)    Urine (mL/kg/hr) 1400 (0.8)    Total Output 1400     Net -390          Stool Occurrence 1 x      PHYSICAL EXAMINATION:  Gen: acutely ill HEENT: NCAT, PERRL PULM:decreased  rhonchi bilaterally L > R -much improved CV: RRR, no mgr, no JVD AB: BS infrequent, soft, nontender, no hsm Ext: warm, no edema Derm: no  rash or skin breakdown Neuro: non focal  LABS:  Recent Labs Lab 10/24/12 1700 10/24/12 1800 10/24/12 1836 10/25/12 0040 10/25/12 0435 10/25/12 0449 10/25/12 0450 10/25/12 1100 10/26/12 0400 10/27/12 0500  HGB 14.0  --   --   --   --   --  12.5  --  10.3* 10.8*  WBC 11.1*  --   --   --   --   --  15.6*  --  9.8 12.1*  PLT 209  --   --   --   --   --  191  --  128* 156  NA 134*  --   --   --   --   --  134*  --  136 135  K 4.3  --   --   --   --   --  4.5  --  3.9 3.7  CL 101  --   --   --   --   --  103  --  102 99  CO2 19  --   --   --   --   --  19  --  23 24  GLUCOSE 253*  --   --   --   --   --  218*  --  116* 145*  BUN 14  --   --   --   --   --  16  --  15 15  CREATININE 0.92  --   --   --   --   --  0.93  --  0.85 0.77  CALCIUM 8.2*  --   --   --   --   --  7.8*  --  8.2* 9.0  MG 1.5  --   --   --   --   --   --   --   --   --   AST 87*  --   --   --   --   --   --   --  40*  --   ALT 53*  --   --   --   --   --   --   --  27  --   ALKPHOS 40  --   --   --   --   --   --   --  30*  --   BILITOT 0.4  --   --   --   --   --   --   --  0.5  --   PROT 6.3  --   --   --   --   --   --   --  5.4*  --   ALBUMIN 3.0*  --   --   --   --   --   --   --  2.4*  --   INR 1.11  --   --   --   --   --   --   --   --   --   LATICACIDVEN  --  3.1*  --   --   --   --   --   --   --   --   TROPONINI 8.67*  --   --  9.36* 9.57*  --   --   --   --   --   PROCALCITON  --   --   --   --   --   --   --  20.01 11.62 6.90  PROBNP 8155.0*  --   --   --   --   --   --   --   --  21057.0*  PHART  --   --  7.256*  --   --  7.335*  --   --   --   --   PCO2ART  --   --  44.4  --   --  39.7  --   --   --   --   PO2ART  --   --  161.0*  --   --  83.6  --   --   --   --     Recent Labs Lab 10/26/12 0446 10/26/12 0802 10/26/12 1143 10/26/12 1726 10/26/12 2127  GLUCAP 75 115* 119* 122* 157*    519 CXR: extensive rt infiltrates esp RUL, int changes  ASSESSMENT / PLAN:  PULMONARY A: Acute  respiratory failure from acute pulmonary edema COPD RUL infiltrate >treat as CAP, note h/o recurrent pneumonia & GERD ? aspiration  P: HCAP rx   CARDIOVASCULAR A: Cardiogenic shock from Takotsubo, shock -improved  P:  --diuresis & lisinopril  per cardiology  RENAL A:  No acute issues P:    Lasix per cards  GASTROINTESTINAL A:  Diverticulosis> not in flare GERD Aspiration- distal esophageal stricture ? Peptic  P:   -pantoprazole -allow diet, GI  consult  HEMATOLOGIC A:  Thrombocytopenia -rapid drop P:  -dc hep sub q if drops < 105k  INFECTIOUS A:  CAP - favor aspiration/GERD P:  -ct cap coverage -dc vanc once cx neg    ENDOCRINE A:  No acute issues P:   -monitor glucose   TODAY'S SUMMARY: Much improved, OK to dc CVL & transfer to tele   [facility] V.,MD Pulmonary and Critical Care Medicine [facility] Pager: [phone]  10/27/2012, 10:41 AM

## 2012-10-27 NOTE — Consult Note (Signed)
[facility] CONSULT Reason for consult: dysphagia Referring Physician: Dr. Charlett Blake Andersson is an 79 y.o. female.  HPI: 79 year old woman who was admitted from [facility] after she had presented with hypoxemia and hypotension. She was intubated. She developed ST elevation and was transferred to [facility] where she underwent cardiac catheterization and found to have Takotsubo cardiomyopathy causing severe congestive heart failure with marked akinesis of the good portion of the heart with ejection fraction approximately 20%. She is felt to be a 2 cardiogenic shock and has been in the ICU on pressors. She's had community-acquired pneumonia with infiltrates of right lower lobe and right upper lobe felt to be due to aspiration. She has gradually improved and now has been extubated.. She had a barium swallow performed because of chronic problems with reflux and that showed silent laryngeal titration without gross aspiration, presbyesophagus and a distal esophageal stricture that prevented passage of an intact 13 mm barium tablet. For these reasons, were asked to see the patient. The patient is extubated and sitting in the chair beside the bed. She is short and oriented and goes a very good history. She describes to me that she has had esophageal reflux prolonged time approaching 10 years or possibly more and is taken over-the-counter medications as well as prescription PPI therapy. At times she has to take antacids. She is a little vague on exactly what she was taking which he came into the hospital. She notes that she has had problems swallowing for several years and has gotten progressively worse. She has problems with both liquids and solids. At times she has had solids become lodged in her esophagus and she had to put her finger down her throat and regurgitate. Liquids generally do okay but if she drinks too fast she has problems with liquids and will occasionally cough after drinking.  She's never had EGD and never had a food impaction that required endoscopic removal. She states that she has been living with the symptoms now for several years and is "used to dealing with it "She is taking various herbal and vitamin products, . Other significant medical problems include hypertension, COPD, breast cancer status post mastectomy and congestive heart failure as noted above.  Past Medical History  Diagnosis Date  . HTN (hypertension)   . COPD (chronic obstructive pulmonary disease)   . Diverticulosis   . Breast CA     Past Surgical History  Procedure Laterality Date  . Mastectomy    . Cataract extraction      Family History  Problem Relation Age of Onset  . CAD      Social History:  reports that she has never smoked. She has never used smokeless tobacco. She reports that she does not drink alcohol or use illicit drugs.  Allergies:  Allergies  Allergen Reactions  . Sulfa Antibiotics     Medications; . aspirin  81 mg Oral Daily  . carvedilol  3.125 mg Oral BID WC  . cefTRIAXone (ROCEPHIN)  IV  1 g Intravenous Q24H  . Chlorhexidine Gluconate Cloth  6 each Topical Q0600  . furosemide  40 mg Intravenous BID  . heparin  5,000 Units Subcutaneous Q8H  . levofloxacin  750 mg Oral Q48H  . lisinopril  2.5 mg Oral Daily  . mupirocin ointment  1 application Nasal BID  . pantoprazole  40 mg Oral Q1200   PRN Meds acetaminophen, nitroGLYCERIN, ondansetron (ZOFRAN) IV Results for orders placed during the hospital encounter of 10/24/12 (from  the past 48 hour(s))  GLUCOSE, CAPILLARY     Status: None   Collection Time    10/25/12  4:22 PM      Result Value Range   Glucose-Capillary 89  70 - 99 mg/dL  GLUCOSE, CAPILLARY     Status: None   Collection Time    10/25/12  8:53 PM      Result Value Range   Glucose-Capillary 89  70 - 99 mg/dL  GLUCOSE, CAPILLARY     Status: None   Collection Time    10/25/12 11:26 PM      Result Value Range   Glucose-Capillary 99  70 - 99  mg/dL  COMPREHENSIVE METABOLIC PANEL     Status: Abnormal   Collection Time    10/26/12  4:00 AM      Result Value Range   Sodium 136  135 - 145 mEq/L   Potassium 3.9  3.5 - 5.1 mEq/L   Chloride 102  96 - 112 mEq/L   CO2 23  19 - 32 mEq/L   Glucose, Bld 116 (*) 70 - 99 mg/dL   BUN 15  6 - 23 mg/dL   Creatinine, Ser 4.09  0.50 - 1.10 mg/dL   Calcium 8.2 (*) 8.4 - 10.5 mg/dL   Total Protein 5.4 (*) 6.0 - 8.3 g/dL   Albumin 2.4 (*) 3.5 - 5.2 g/dL   AST 40 (*) 0 - 37 U/L   ALT 27  0 - 35 U/L   Alkaline Phosphatase 30 (*) 39 - 117 U/L   Total Bilirubin 0.5  0.3 - 1.2 mg/dL   GFR calc non Af Amer 59 (*) >90 mL/min   GFR calc Af Amer 69 (*) >90 mL/min   Comment:            The eGFR has been calculated     using the CKD EPI equation.     This calculation has not been     validated in all clinical     situations.     eGFR's persistently     <90 mL/min signify     possible Chronic Kidney Disease.  CBC WITH DIFFERENTIAL     Status: Abnormal   Collection Time    10/26/12  4:00 AM      Result Value Range   WBC 9.8  4.0 - 10.5 K/uL   RBC 3.22 (*) 3.87 - 5.11 MIL/uL   Hemoglobin 10.3 (*) 12.0 - 15.0 g/dL   Comment: REPEATED TO VERIFY     DELTA CHECK NOTED   HCT 30.4 (*) 36.0 - 46.0 %   MCV 94.4  78.0 - 100.0 fL   MCH 32.0  26.0 - 34.0 pg   MCHC 33.9  30.0 - 36.0 g/dL   RDW 81.1  91.4 - 78.2 %   Platelets 128 (*) 150 - 400 K/uL   Neutrophils Relative % 80 (*) 43 - 77 %   Neutro Abs 7.9 (*) 1.7 - 7.7 K/uL   Lymphocytes Relative 14  12 - 46 %   Lymphs Abs 1.3  0.7 - 4.0 K/uL   Monocytes Relative 6  3 - 12 %   Monocytes Absolute 0.6  0.1 - 1.0 K/uL   Eosinophils Relative 0  0 - 5 %   Eosinophils Absolute 0.0  0.0 - 0.7 K/uL   Basophils Relative 0  0 - 1 %   Basophils Absolute 0.0  0.0 - 0.1 K/uL  PROCALCITONIN     Status: None  Collection Time    10/26/12  4:00 AM      Result Value Range   Procalcitonin 11.62     Comment:            Interpretation:     PCT >= 10 ng/mL:      Important systemic inflammatory response,     almost exclusively due to severe bacterial     sepsis or septic shock.     (NOTE)             ICU PCT Algorithm               Non ICU PCT Algorithm        ----------------------------     ------------------------------             PCT < 0.25 ng/mL                 PCT < 0.1 ng/mL         Stopping of antibiotics            Stopping of antibiotics           strongly encouraged.               strongly encouraged.        ----------------------------     ------------------------------           PCT level decrease by               PCT < 0.25 ng/mL           >= 80% from peak PCT           OR PCT 0.25 - 0.5 ng/mL          Stopping of antibiotics                                                 encouraged.         Stopping of antibiotics               encouraged.        ----------------------------     ------------------------------           PCT level decrease by              PCT >= 0.25 ng/mL           < 80% from peak PCT            AND PCT >= 0.5 ng/mL            Continuing antibiotics                                                  encouraged.           Continuing antibiotics                encouraged.        ----------------------------     ------------------------------         PCT level increase compared          PCT > 0.5 ng/mL             with peak PCT AND  PCT >= 0.5 ng/mL             Escalation of antibiotics                                              strongly encouraged.          Escalation of antibiotics            strongly encouraged.  GLUCOSE, CAPILLARY     Status: None   Collection Time    10/26/12  4:46 AM      Result Value Range   Glucose-Capillary 75  70 - 99 mg/dL  GLUCOSE, CAPILLARY     Status: Abnormal   Collection Time    10/26/12  8:02 AM      Result Value Range   Glucose-Capillary 115 (*) 70 - 99 mg/dL   Comment 1 Documented in Chart     Comment 2 Notify RN    GLUCOSE, CAPILLARY     Status: Abnormal    Collection Time    10/26/12 11:43 AM      Result Value Range   Glucose-Capillary 119 (*) 70 - 99 mg/dL  GLUCOSE, CAPILLARY     Status: Abnormal   Collection Time    10/26/12  5:26 PM      Result Value Range   Glucose-Capillary 122 (*) 70 - 99 mg/dL  GLUCOSE, CAPILLARY     Status: Abnormal   Collection Time    10/26/12  9:27 PM      Result Value Range   Glucose-Capillary 157 (*) 70 - 99 mg/dL  BASIC METABOLIC PANEL     Status: Abnormal   Collection Time    10/27/12  5:00 AM      Result Value Range   Sodium 135  135 - 145 mEq/L   Potassium 3.7  3.5 - 5.1 mEq/L   Chloride 99  96 - 112 mEq/L   CO2 24  19 - 32 mEq/L   Glucose, Bld 145 (*) 70 - 99 mg/dL   BUN 15  6 - 23 mg/dL   Creatinine, Ser 1.61  0.50 - 1.10 mg/dL   Calcium 9.0  8.4 - 09.6 mg/dL   GFR calc non Af Amer 73 (*) >90 mL/min   GFR calc Af Amer 84 (*) >90 mL/min   Comment:            The eGFR has been calculated     using the CKD EPI equation.     This calculation has not been     validated in all clinical     situations.     eGFR's persistently     <90 mL/min signify     possible Chronic Kidney Disease.  CBC     Status: Abnormal   Collection Time    10/27/12  5:00 AM      Result Value Range   WBC 12.1 (*) 4.0 - 10.5 K/uL   RBC 3.42 (*) 3.87 - 5.11 MIL/uL   Hemoglobin 10.8 (*) 12.0 - 15.0 g/dL   HCT 04.5 (*) 40.9 - 81.1 %   MCV 94.2  78.0 - 100.0 fL   MCH 31.6  26.0 - 34.0 pg   MCHC 33.5  30.0 - 36.0 g/dL   RDW 91.4  78.2 - 95.6 %   Platelets 156  150 - 400 K/uL  PRO B NATRIURETIC  PEPTIDE     Status: Abnormal   Collection Time    10/27/12  5:00 AM      Result Value Range   Pro B Natriuretic peptide (BNP) 21057.0 (*) 0 - 450 pg/mL  PROCALCITONIN     Status: None   Collection Time    10/27/12  5:00 AM      Result Value Range   Procalcitonin 6.90     Comment:            Interpretation:     PCT > 2 ng/mL:     Systemic infection (sepsis) is likely,     unless other causes are known.     (NOTE)              ICU PCT Algorithm               Non ICU PCT Algorithm        ----------------------------     ------------------------------             PCT < 0.25 ng/mL                 PCT < 0.1 ng/mL         Stopping of antibiotics            Stopping of antibiotics           strongly encouraged.               strongly encouraged.        ----------------------------     ------------------------------           PCT level decrease by               PCT < 0.25 ng/mL           >= 80% from peak PCT           OR PCT 0.25 - 0.5 ng/mL          Stopping of antibiotics                                                 encouraged.         Stopping of antibiotics               encouraged.        ----------------------------     ------------------------------           PCT level decrease by              PCT >= 0.25 ng/mL           < 80% from peak PCT            AND PCT >= 0.5 ng/mL            Continuing antibiotics                                                  encouraged.           Continuing antibiotics                encouraged.        ----------------------------     ------------------------------  PCT level increase compared          PCT > 0.5 ng/mL             with peak PCT AND              PCT >= 0.5 ng/mL             Escalation of antibiotics                                              strongly encouraged.          Escalation of antibiotics            strongly encouraged.    Dg Esophagus  10/26/2012   *RADIOLOGY REPORT*  Clinical Data: Dysphagia with regurgitation.  History of gastroesophageal reflux disease.  ESOPHOGRAM / BARIUM SWALLOW / BARIUM TABLET STUDY  Technique:  Combined double contrast and single contrast examination performed using effervescent crystals, thick barium liquid, and thin barium liquid.  The patient was observed with fluoroscopy swallowing a 13mm barium sulphate tablet.  Fluoroscopy time:  1 minute 9 seconds.  Comparison:  Portable chest 10/26/2012.  Findings:   Examination is mildly limited by the patient's limited mobility.  Silent laryngeal penetration was noted.  There was no gross aspiration into the tracheobronchial tree.  There is mild presbyesophagus.  There is a small hiatal hernia associated with a small distal esophageal stricture.  No mucosal ulceration is identified.  However, this distal stricture prevented the passage of a 13 mm barium tablet (observed intermittently over approximately 3 mm of fluoroscopy).  IMPRESSION:  1.  Silent laryngeal penetration.  No gross aspiration identified. Consider dedicated speech therapy evaluation. 2.  Mild presbyesophagus. 3.  Distal esophageal stricture prevents the passage of a 13 mm barium tablet. This is associated with a small hiatal hernia and is likely a peptic stricture.   Original Report Authenticated By: Carey Bullocks, M.D.   Dg Chest Port 1 View  10/26/2012   *RADIOLOGY REPORT*  Clinical Data: Pneumonia.  PORTABLE CHEST - 1 VIEW  Comparison: 10/25/2012.  Findings: The left IJ catheter is stable.  The endotracheal tube and NG tubes have been removed.  There are persistent right upper lobe and right lower lobe infiltrates. Persistent left lower lobe atelectasis.  No pneumothorax.  IMPRESSION: Removal of ET and NG tubes. Persistent right upper lobe and right lower lobe infiltrates.   Original Report Authenticated By: Rudie Meyer, M.D.               Blood pressure 118/66, pulse 100, temperature 97 F (36.1 C), temperature source Oral, resp. rate 23, height 5\' 6"  (1.676 m), weight 73.5 kg (162 lb 0.6 oz), SpO2 95.00%.  Physical exam:   General--patient sitting in a chair beside the bed. Alert and oriented in no distress Heart--regular rate and rhythm without murmurs or gallops Lungs--diminished breath sounds throughout no clear wheezing Abdomen--obese but soft and nontender   Assessment: 1. Esophageal stricture. The patient clearly has reflux and has for some time and this is all  consistent with peptic stricture. Suspect that she aspirates intermittently and this may have been contributing to her current respiratory failure 2. Acute respiratory failure/community-acquired pneumonia. This could all be aspiration. She does appear to be slowly improving. 3.Takotsubo Syndrome. Associated with severe cardiogenic shock with marked left ventricular dysfunction. Patient also has elevated troponins  and cardiomyopathy. She does clearly appeared to be improving 4. Community-acquired pneumonia. Has persistent right lower lobe right upper lobe infiltrates is on antibiotics. Suspect this was at least partially aspiration related  Plan: This is clearly a chronic problem for her and I think she would benefit from esophageal dilatation. Unfortunately, she is critically ill and in no condition at this time to undergo an elective procedure of this nature. I have discussed with her the procedure including the possibility of perforation and need for emergency surgery. Hopefully, her pneumonia and cardiovascular status will continue to improve with supportive therapy and we would be able to perform a dilatation prior to her discharge. We will continue to follow her in the hospital until such time as it is felt that she would be a candidate for esophageal dilatation.   Pat Elicker JR,Janthony Holleman L 10/27/2012, 12:21 PM

## 2012-10-27 NOTE — Progress Notes (Signed)
PT Cancellation Note  Patient Details Name: [name] [name] MRN: [id number] DOB: June 24, 1922   Cancelled Treatment:    Reason Eval/Treat Not Completed: Medical issues which prohibited therapy. Pt's RN reported she has been OOB today to [facility] and recliner. Pt returned back to bed and is reporting fatigue and SOB. Will attempt evaluation tomorrow.   [name] 10/27/2012, 11:27 AM

## 2012-10-27 NOTE — Progress Notes (Signed)
79 yo female with hypertension, COPD, transferred from [facility] after presenting hypoxic, hypotensive, and responsive thought secondary to pneumonia. Pt subsequently intubated in ER. Initial EKGs w/o significant ST elevation. Follow-up EKGs with ST elevation V3-V6. Transferred for cardiac catheterization.  Subjective:  No acute events overnight, esophagram with laryngeal penetration and likely stricture, pt denies further "burning" when she swallows (drinking coffee on exam), further states that she often gets food "hung up" in her chest when she swallows which has been going on for many years and has been on reflux medicine for just as long.  Also c/o continued fatigue and having a "funny feeling" in her chest as if something is "twisting about".  Objective:  Vital Signs in the last 24 hours: Temp:  [97 F (36.1 C)-98.9 F (37.2 C)] 97 F (36.1 C) (05/20 0400) Pulse Rate:  [92-117] 100 (05/19 1500) Resp:  [0-31] 23 (05/20 0400) BP: (95-124)/(43-66) 118/66 mmHg (05/20 0400) SpO2:  [95 %-98 %] 95 % (05/20 0400) Weight:  [162 lb 0.6 oz (73.5 kg)] 162 lb 0.6 oz (73.5 kg) (05/20 0500)  Intake/Output from previous day: 05/19 0701 - 05/20 0700 In: 1010 [P.O.:840; I.V.:120; IV Piggyback:50] Out: 1400 [Urine:1400]  Physical Exam: General: Elderly white female, sitting in bedside recliner, in no acute distress; HEENT: Normocephalic, atraumatic, edentulous Neck: supple, no masses, no carotid Bruits, no JVD appreciated. Lungs: Normal respiratory effort. Clear to auscultation bilaterally from apices to bases without crackles or wheezes appreciated. Heart: normal rate, regular rhythm, normal S1 and S2, no gallop, murmur, or rubs appreciated. Abdomen: BS normoactive. Soft, Nondistended, non-tender. No masses or organomegaly appreciated. Extremities: No pretibial edema, distal pulses intact, large ecchymosisis to bilateral shin area Neurologic: grossly non-focal, alert and  oriented, appropriate and cooperative throughout examination.    Lab Results:  Recent Labs  10/26/12 0400 10/27/12 0500  WBC 9.8 12.1*  HGB 10.3* 10.8*  PLT 128* 156    Recent Labs  10/26/12 0400 10/27/12 0500  NA 136 135  K 3.9 3.7  CL 102 99  CO2 23 24  GLUCOSE 116* 145*  BUN 15 15  CREATININE 0.85 0.77    Recent Labs  10/25/12 0040 10/25/12 0435  TROPONINI 9.36* 9.57*    Cardiac Studies: 10/24/2012 Coronary angiography:  Coronary dominance: right  Left mainstem: Widely patent with very mild 20% ostial stenosis. There is no significant calcification.  Left anterior descending (LAD): The LAD is patent to the left ventricular apex. The distal flow pattern in the LAD demonstrates mildly slow flow. There is diffuse irregularity but essentially the vessel is widely patent throughout. The diagonals are patent with mild ostial stenosis. There are no severe flow limiting stenoses throughout the course of the LAD distribution. The vessel wraps around the left ventricular apex.  Left circumflex (LCx): The left circumflex is also patent throughout. There is minor irregularity. There were 2 obtuse marginal branches with no significant stenoses. The left posterolateral branch is patent without significant stenosis.  Right coronary artery (RCA): The right coronary artery is a large, dominant vessel. The vessel is smooth throughout its course. The PDA and PLA branches are both large without significant stenoses the  Left ventriculography: Left ventricular function is markedly depressed. The ventricle has a classic appearance of Takotsubo cardiomyopathy. The anterior base and inferior base of the heart are dynamic. The entire anterolateral, periapical, and inferoapical/mid inferior walls are akinetic. The estimated left ventricular ejection fraction is 20%. There is no appreciable mitral regurgitation.  10/26/2012  2D ECHO  Study Conclusions - Left ventricle: The study is technically  very limited. It seems that the LV dsyfunction is more global than focal. The EF may be in the 40% range. - Aortic valve: Sclerosis without stenosis. - Right ventricle: Not able to assess RV function due to poor visualization. Not able to assess RV size due to poor visualization. - Pulmonary arteries: PA peak pressure: 34mm Hg (S).  10/26/2012 Esophagram/Barium Swallow IMPRESSION:  1. Silent laryngeal penetration. No gross aspiration identified.  Consider dedicated speech therapy evaluation.  2. Mild presbyesophagus.  3. Distal esophageal stricture prevents the passage of a 13 mm  barium tablet. This is associated with a small hiatal hernia and is  likely a peptic stricture.  Tele: NSR  Assessment/Plan:  79 yo admitted with clinical picture c/w Acute Coronary Syndrome in setting of likely pneumonia found to have Takotsubo Cardiomyopathy on cardiac catheterization.  # Acute cardiogenic shock: secondary to Takotsubo syndrome, improved hemodynamics, slightly tachycardic 87-108 bpm, bp 95-124/43-66 (118/66 91 bp, this morning)   # Severe left ventricular systolic dysfunction: EF 20%, entire anterolateral, periapical, and inferoapical/mid inferior walls are akinetic on ventriculogram. 2D ECHO yesterday technically difficult with EF~40% Pro-BNP ~21K, Will need follow-up 2D ECHOs. Consider starting metoprolol.  # ST elevation on EKG with elevated troponins: secondary to Takotsubo Cardiomyopathy   Recent Labs Lab 10/24/12 1700 10/25/12 0040 10/25/12 0435  TROPONINI 8.67* 9.36* 9.57*   # Nonobstructive CAD, mild: on ASA 81 mg qd  # Acute respiratory failure: likely secondary to pneumonia, Now extubated  Recent Labs Lab 10/24/12 1836 10/25/12 0449  PHART 7.256* 7.335*  PCO2ART 44.4 39.7  PO2ART 161.0* 83.6  HCO3 19.7* 20.6  TCO2 21 21.8  O2SAT 99.0 97.8   # Community Acquired Pneumonia, probable: persistent RLL and RUL infiltrates on CXR, on Ceftriaxone and levaquin,  Mild  leukocytosis today, Bcx w/o growth to date.  Recent Labs Lab 10/24/12 1700 10/25/12 0450 10/26/12 0400 10/27/12 0500  WBC 11.1* 15.6* 9.8 12.1*   # GERD with dysphagia: esophagram with likely peptic stricture and laryngeal penetration, on PPI, consult GI  Continue supportive therapy.  Pt hemodynamically stable-->Consider initiation of metoprolol as permitted by bp. ECHO indicating improved function although procedure was technically limited due to her poor mobility.  She will need close follow-up with a cardiologist in the following weeks to ensure resolution of the cardiomyopathy with serial ECHOs. She has GERD with  Barium swallow indicating silent penetration w/o gross aspiration, mild dysfunction in motility, small distal stricture likely peptic stricture. Will need GI consult.  Kristie Cowman, M.D. 10/27/2012, 6:36 AM   Patient seen with resident, agree with the above note.  1. PNA: Patient has PNA, possibly related to aspiration.  She does have a peptic stricture.   - Antibiotics per CCM.  - Would involve GI given stricture and aspiration concern.  We will consult.  2. Stress cardiomyopathy (Takotsubo-type): Suspect this was triggered by PNA.  No significant CAD.  Suspect some volume overload.   - Change Lasix to 40 mg IV bid.  - Continue lisinopril 2.5 - Add Coreg 3.125 mg bid (beta blockade may be especially helpful with stress cardiomyopathy).  - Monitor CVP.   Marca Ancona 10/27/2012 8:14 AM

## 2012-10-28 ENCOUNTER — Inpatient Hospital Stay (HOSPITAL_COMMUNITY): Payer: Medicare Other

## 2012-10-28 DIAGNOSIS — J9600 Acute respiratory failure, unspecified whether with hypoxia or hypercapnia: Secondary | ICD-10-CM

## 2012-10-28 DIAGNOSIS — I428 Other cardiomyopathies: Secondary | ICD-10-CM

## 2012-10-28 DIAGNOSIS — R570 Cardiogenic shock: Secondary | ICD-10-CM | POA: Diagnosis present

## 2012-10-28 DIAGNOSIS — K219 Gastro-esophageal reflux disease without esophagitis: Secondary | ICD-10-CM | POA: Diagnosis present

## 2012-10-28 DIAGNOSIS — E876 Hypokalemia: Secondary | ICD-10-CM | POA: Diagnosis present

## 2012-10-28 DIAGNOSIS — K222 Esophageal obstruction: Secondary | ICD-10-CM | POA: Diagnosis present

## 2012-10-28 DIAGNOSIS — I5021 Acute systolic (congestive) heart failure: Secondary | ICD-10-CM

## 2012-10-28 LAB — CBC
MCHC: 34 g/dL (ref 30.0–36.0)
Platelets: 167 10*3/uL (ref 150–400)
RDW: 14.5 % (ref 11.5–15.5)

## 2012-10-28 LAB — BASIC METABOLIC PANEL
BUN: 19 mg/dL (ref 6–23)
Calcium: 8.9 mg/dL (ref 8.4–10.5)
Creatinine, Ser: 0.79 mg/dL (ref 0.50–1.10)
GFR calc Af Amer: 84 mL/min — ABNORMAL LOW (ref >90)
GFR calc non Af Amer: 72 mL/min — ABNORMAL LOW (ref >90)

## 2012-10-28 MED ORDER — POTASSIUM CHLORIDE CRYS ER 20 MEQ PO TBCR
40.0000 meq | EXTENDED_RELEASE_TABLET | Freq: Two times a day (BID) | ORAL | Status: AC
  Administered 2012-10-28 (×2): 40 meq via ORAL
  Filled 2012-10-28 (×2): qty 2

## 2012-10-28 NOTE — Progress Notes (Addendum)
PULMONARY  / CRITICAL CARE MEDICINE  Name: [name] [name] MRN: [id number] DOB: 1922-09-29    ADMISSION DATE:  10/24/2012 CONSULTATION DATE:  10/24/2012  REFERRING MD :  Excell Seltzer PRIMARY SERVICE: Cardiology  CHIEF COMPLAINT:  Acute respiratory failure  BRIEF PATIENT DESCRIPTION: 79 y/o female with diverticulosis, COPD, and breast cancer was admitted to [facility] on 5/17 after acute respiratory failure.  She was intubated in the [facility] and brought to the [facility] cath lab where she was found to have tako-tsubo disease.  She reports sudden onset symptoms overnight causing resp failure She reports pneumonia in dec & jan & severe GERD  SIGNIFICANT EVENTS / STUDIES:  5/17 admitted> 5/17 LHC > Takotsubo syndrome with severe LV systolic dysfunction, mild non-obstructive CAD 5/18- remains in shock  LINES / TUBES: 5/17 ETT >>5/18 5/17 L IJ CVL >>5/19  CULTURES: BC 5/17>>> ng 5/17 sputum>>>ng Urine strep ag >>neg Urine leg ag >>neg  ANTIBIOTICS: Levo 5/18>>> ceftraixone 5/18>>> 5/21 vanc 5/28>>>off    SUBJECTIVE:  Denies pain or SOB.  Walking with PT.   VITAL SIGNS: Temp:  [98.2 F (36.8 C)-99 F (37.2 C)] 99 F (37.2 C) (05/21 0438) Pulse Rate:  [82-99] 99 (05/21 0745) Resp:  [20] 20 (05/21 0438) BP: (90-143)/(49-72) 118/64 mmHg (05/21 0950) SpO2:  [93 %-95 %] 95 % (05/21 0438) Weight:  [158 lb 12.8 oz (72.031 kg)] 158 lb 12.8 oz (72.031 kg) (05/21 0438) HEMODYNAMICS:   VENTILATOR SETTINGS:   INTAKE / OUTPUT: Intake/Output     05/20 0701 - 05/21 0700 05/21 0701 - 05/22 0700   P.O. 360 240   I.V. (mL/kg)     IV Piggyback 50    Total Intake(mL/kg) 410 (5.7) 240 (3.3)   Urine (mL/kg/hr) 3200 (1.9) 350 (1.4)   Total Output 3200 350   Net -2790 -110        Urine Occurrence 1 x      PHYSICAL EXAMINATION:  Gen: chronically ill appearing, NAD walking  HEENT: NCAT, PERRL PULM: few scattered ronchi, resps even non labored  CV: RRR, no mgr, no JVD AB: BS infrequent, soft, nontender, no  hsm Ext: warm, no edema Derm: no rash or skin breakdown Neuro: non focal  LABS:  Recent Labs Lab 10/24/12 1700 10/24/12 1800 10/24/12 1836 10/25/12 0040 10/25/12 0435 10/25/12 0449  10/25/12 1100 10/26/12 0400 10/27/12 0500 10/28/12 0530  HGB 14.0  --   --   --   --   --   < >  --  10.3* 10.8* 11.1*  WBC 11.1*  --   --   --   --   --   < >  --  9.8 12.1* 7.3  PLT 209  --   --   --   --   --   < >  --  128* 156 167  NA 134*  --   --   --   --   --   < >  --  136 135 134*  K 4.3  --   --   --   --   --   < >  --  3.9 3.7 3.0*  CL 101  --   --   --   --   --   < >  --  102 99 95*  CO2 19  --   --   --   --   --   < >  --  23 24 28   GLUCOSE 253*  --   --   --   --   --   < >  --  116* 145* 120*  BUN 14  --   --   --   --   --   < >  --  15 15 19   CREATININE 0.92  --   --   --   --   --   < >  --  0.85 0.77 0.79  CALCIUM 8.2*  --   --   --   --   --   < >  --  8.2* 9.0 8.9  MG 1.5  --   --   --   --   --   --   --   --   --   --   AST 87*  --   --   --   --   --   --   --  40*  --   --   ALT 53*  --   --   --   --   --   --   --  27  --   --   ALKPHOS 40  --   --   --   --   --   --   --  30*  --   --   BILITOT 0.4  --   --   --   --   --   --   --  0.5  --   --   PROT 6.3  --   --   --   --   --   --   --  5.4*  --   --   ALBUMIN 3.0*  --   --   --   --   --   --   --  2.4*  --   --   INR 1.11  --   --   --   --   --   --   --   --   --   --   LATICACIDVEN  --  3.1*  --   --   --   --   --   --   --   --   --   TROPONINI 8.67*  --   --  9.36* 9.57*  --   --   --   --   --   --   PROCALCITON  --   --   --   --   --   --   --  20.01 11.62 6.90  --   PROBNP 8155.0*  --   --   --   --   --   --   --   --  21057.0*  --   PHART  --   --  7.256*  --   --  7.335*  --   --   --   --   --   PCO2ART  --   --  44.4  --   --  39.7  --   --   --   --   --   PO2ART  --   --  161.0*  --   --  83.6  --   --   --   --   --   < > = values in this interval not displayed.  Recent Labs Lab  10/26/12 0446 10/26/12 0802 10/26/12 1143 10/26/12 1726 10/26/12 2127  GLUCAP 75 115* 119* 122* 157*   Dg Esophagus  10/26/2012   *RADIOLOGY REPORT*  Clinical Data: Dysphagia with regurgitation.  History of gastroesophageal reflux disease.  ESOPHOGRAM / BARIUM SWALLOW / BARIUM TABLET STUDY  Technique:  Combined double contrast and single contrast examination performed using effervescent crystals, thick barium liquid, and thin barium liquid.  The patient was observed with fluoroscopy swallowing a 13mm barium sulphate tablet.  Fluoroscopy time:  1 minute 9 seconds.  Comparison:  Portable chest 10/26/2012.  Findings:  Examination is mildly limited by the patient's limited mobility.  Silent laryngeal penetration was noted.  There was no gross aspiration into the tracheobronchial tree.  There is mild presbyesophagus.  There is a small hiatal hernia associated with a small distal esophageal stricture.  No mucosal ulceration is identified.  However, this distal stricture prevented the passage of a 13 mm barium tablet (observed intermittently over approximately 3 mm of fluoroscopy).  IMPRESSION:  1.  Silent laryngeal penetration.  No gross aspiration identified. Consider dedicated speech therapy evaluation. 2.  Mild presbyesophagus. 3.  Distal esophageal stricture prevents the passage of a 13 mm barium tablet. This is associated with a small hiatal hernia and is likely a peptic stricture.   Original Report Authenticated By: Carey Bullocks, M.D.   Dg Chest 2v Repeat Same Day  10/28/2012   *RADIOLOGY REPORT*  Clinical Data: Short of breath, weak, pneumonia  CHEST - 2 VIEW SAME DAY  Comparison: Prior chest x-ray 10/26/2012  Findings: Interval development of a moderate right and small left pleural effusions.  A small left effusion may have been present previously.  Persistent airspace disease throughout the right upper and to a lesser extent right lower lobes.  Stable cardiomegaly. Atherosclerosis of the transverse  aorta. Surgical clips in the left axilla.  No acute osseous abnormality.  IMPRESSION: 1.  Interval development of a moderate right pleural (likely parapneumonic) effusion. 2.  Similar appearance of the right upper greater than lower lobe infiltrates consistent with the clinical history of pneumonia. Please note that can take 4 - 6 weeks for pneumonic infiltrates to resolve by conventional chest x-ray.  If the opacities persist beyond this time frame, recommend CT scan of the chest to evaluate for underlying malignancy. 3.  Probable stable trace left pleural effusion.   Original Report Authenticated By: Malachy Moan, M.D.    ASSESSMENT / PLAN:  PULMONARY A: Acute respiratory failure from acute pulmonary edema COPD RUL infiltrate >treat as CAP, note h/o recurrent pneumonia & GERD ? aspiration Moderate R pleural effusion - new 5/21  P: pulm hygiene  F/u CXR to follow effusion, suspect this will merit thoracentesis, will ask IR to eval   CARDIOVASCULAR A: Cardiogenic shock from Takotsubo, shock -improved  P:  --diuresis & lisinopril  per cardiology  RENAL A:  No acute issues P:    Lasix per cards, currently 40 IV bid  GASTROINTESTINAL A:  Diverticulosis> not in flare GERD Aspiration- distal esophageal stricture ? Peptic  P:   -pantoprazole -allow diet, GI consult  HEMATOLOGIC A:  Thrombocytopenia -rapid drop - resolved 5/21 P:  -f/u cbc   INFECTIOUS A:  CAP - favor aspiration/GERD P:  -ct cap coverage  -vanc off    ENDOCRINE A:  No acute issues P:   -monitor glucose   Cont CAP coverage.  F/u CXR in am to monitor R effusion - effusion is small, clinically much improved.  Needs SNF at d/c.   Danford Bad, NP 10/28/2012  10:41 AM Pager: [phone] or [phone]  *Care during the described time interval was provided by me and/or other providers on the critical care  team. I have reviewed this patient's available data, including medical history,  events of note, physical examination and test results as part of my evaluation.  Levy Pupa, MD, PhD 10/28/2012, 2:02 PM [facility] [phone] or if no answer [phone]

## 2012-10-28 NOTE — Progress Notes (Signed)
Patient Name: [name] [name] Date of Encounter: 10/28/2012   Principal Problem:   Takotsubo syndrome Active Problems:   Acute systolic CHF (congestive heart failure)   Acute respiratory failure   Cardiogenic shock   Community acquired pneumonia   Hypokalemia   GERD (gastroesophageal reflux disease)   Esophageal stricture   SUBJECTIVE  Orthopneic last night.  Says she just couldn't get comfortable and sat up most of the night.  She diuresed well yesterday, - 2790 with weight coming down to 158 from 164 on admission.  No chest pain.  Weak and tired this AM.  CURRENT MEDS . aspirin  81 mg Oral Daily  . carvedilol  3.125 mg Oral BID WC  . cefTRIAXone (ROCEPHIN)  IV  1 g Intravenous Q24H  . Chlorhexidine Gluconate Cloth  6 each Topical Q0600  . furosemide  40 mg Intravenous BID  . heparin  5,000 Units Subcutaneous Q8H  . levofloxacin  750 mg Oral Q48H  . lisinopril  2.5 mg Oral Daily  . mupirocin ointment  1 application Nasal BID  . pantoprazole  40 mg Oral Q1200  . potassium chloride  40 mEq Oral BID  . tiotropium  18 mcg Inhalation Daily  . vitamin B-12  1,000 mcg Oral Daily  . vitamin C  500 mg Oral Daily   OBJECTIVE  Filed Vitals:   10/27/12 1936 10/28/12 0438 10/28/12 0745 10/28/12 0950  BP: 104/49 102/72 143/67 118/64  Pulse: 94 87 99   Temp: 98.2 F (36.8 C) 99 F (37.2 C)    TempSrc: Oral Oral    Resp: 20 20    Height:      Weight:  158 lb 12.8 oz (72.031 kg)    SpO2: 95% 95%      Intake/Output Summary (Last 24 hours) at 10/28/12 1103 Last data filed at 10/28/12 0730  Gross per 24 hour  Intake    530 ml  Output   2750 ml  Net  -2220 ml   Filed Weights   10/26/12 0430 10/27/12 0500 10/28/12 0438  Weight: 164 lb 10.9 oz (74.7 kg) 162 lb 0.6 oz (73.5 kg) 158 lb 12.8 oz (72.031 kg)   PHYSICAL EXAM  General: Pleasant, NAD. Neuro: Alert and oriented X 3. Moves all extremities spontaneously. Psych: Normal affect. HEENT:  Normal  Neck: Supple without  bruits.  Mildly elevated neck veins. Lungs:  Resp regular and unlabored, diminished breath sounds in right base with bibasilar crackles. Heart: RRR no s3, s4, or murmurs. Abdomen: Soft, non-tender, non-distended, BS + x 4.  Extremities: Ecchymotic lower legs bilat.  No clubbing, cyanosis or edema. DP/PT/Radials 1+ and equal bilaterally.  Accessory Clinical Findings  CBC  Recent Labs  10/26/12 0400 10/27/12 0500 10/28/12 0530  WBC 9.8 12.1* 7.3  NEUTROABS 7.9*  --   --   HGB 10.3* 10.8* 11.1*  HCT 30.4* 32.2* 32.6*  MCV 94.4 94.2 93.7  PLT 128* 156 167   Basic Metabolic Panel  Recent Labs  10/27/12 0500 10/28/12 0530  NA 135 134*  K 3.7 3.0*  CL 99 95*  CO2 24 28  GLUCOSE 145* 120*  BUN 15 19  CREATININE 0.77 0.79  CALCIUM 9.0 8.9   Liver Function Tests  Recent Labs  10/26/12 0400  AST 40*  ALT 27  ALKPHOS 30*  BILITOT 0.5  PROT 5.4*  ALBUMIN 2.4*   TELE  Rsr, occas pvc's/couplets.  2D Echocardiogram 5.19.2014  Study Conclusions  - Left ventricle: The study is  technically very limited. It   seems that the LV dsyfunction is more global than focal.   The EF may be in the 40% range. - Aortic valve: Sclerosis without stenosis. - Right ventricle: Not able to assess RV function due to   poor visualization. Not able to assess RV size due to poor   visualization. - Pulmonary arteries: PA peak pressure: 34mm Hg (S). _____________  Radiology/Studies  Dg Esophagus  10/26/2012   *RADIOLOGY REPORT*  Clinical Data: Dysphagia with regurgitation.  History of gastroesophageal reflux disease.  ESOPHOGRAM / BARIUM SWALLOW / BARIUM TABLET STUDY  Technique:  Combined double contrast and single contrast examination performed using effervescent crystals, thick barium liquid, and thin barium liquid.  The patient was observed with fluoroscopy swallowing a 13mm barium sulphate tablet.  Fluoroscopy time:  1 minute 9 seconds.  Comparison:  Portable chest 10/26/2012.   Findings:  Examination is mildly limited by the patient's limited mobility.  Silent laryngeal penetration was noted.  There was no gross aspiration into the tracheobronchial tree.  There is mild presbyesophagus.  There is a small hiatal hernia associated with a small distal esophageal stricture.  No mucosal ulceration is identified.  However, this distal stricture prevented the passage of a 13 mm barium tablet (observed intermittently over approximately 3 mm of fluoroscopy).  IMPRESSION:  1.  Silent laryngeal penetration.  No gross aspiration identified. Consider dedicated speech therapy evaluation. 2.  Mild presbyesophagus. 3.  Distal esophageal stricture prevents the passage of a 13 mm barium tablet. This is associated with a small hiatal hernia and is likely a peptic stricture.   Original Report Authenticated By: Carey Bullocks, M.D.   Dg Chest 2v Repeat Same Day  10/28/2012   *RADIOLOGY REPORT*  Clinical Data: Short of breath, weak, pneumonia  CHEST - 2 VIEW SAME DAY  Comparison: Prior chest x-ray 10/26/2012  Findings: Interval development of a moderate right and small left pleural effusions.  A small left effusion may have been present previously.  Persistent airspace disease throughout the right upper and to a lesser extent right lower lobes.  Stable cardiomegaly. Atherosclerosis of the transverse aorta. Surgical clips in the left axilla.  No acute osseous abnormality.  IMPRESSION: 1.  Interval development of a moderate right pleural (likely parapneumonic) effusion. 2.  Similar appearance of the right upper greater than lower lobe infiltrates consistent with the clinical history of pneumonia. Please note that can take 4 - 6 weeks for pneumonic infiltrates to resolve by conventional chest x-ray.  If the opacities persist beyond this time frame, recommend CT scan of the chest to evaluate for underlying malignancy. 3.  Probable stable trace left pleural effusion.   Original Report Authenticated By: Malachy Moan, M.D.   ASSESSMENT AND PLAN  1.  Takotsubo Cardiomyopathy/Acute Systolic CHF/Cardiogenic shock:  Pt presented with ST elevation and elevated troponin in setting of resp failure/pna and nonobstructive CAD by cath.  EF initially measured @ 20% by LV gram, improved to 40% by echo on 5/19.  She is hemodynamically stable and was transferred to the floor yesterday.  Good diuresis yesterday, - 2790, with drop in weight to 158 from 164.  She continues to have orthopnea with crackles on exam.  Neck veins only mildly elevated.  Will Cont IV lasix today and plan to convert to PO tomorrow.  Renal fxn stable.  Cont bb/acei.  2. Acute respiratory failure/VDRF:  Extubated 5/18.  3.  CAP/? Asp PNA:  abx per pulm.  4.  R Pleural  effusion:  Follow.  5. Hypokalemia:  Supp (already ordered).  6.  Esophageal Stricture:  Appreciate GI input.  Possible dilatation once clinically stable, prior to d/c.  Signed, Nicolasa Ducking NP Patient examined and agree except changes made.Change to po lasix tomorrow.  Valera Castle, MD 10/28/2012 2:35 PM

## 2012-10-28 NOTE — Progress Notes (Signed)
10/28/2012 12:01 PM Nursing note IV team still unable to obtain iv access on right arm. Verbal orders received ok to use IV site on left wrist for IV lasix from Ward Givens NP. Verbal orders received ok to use IV site on left wrist for IV Rocephin per Danford Bad NP as well. Pt. Updated on plan. Will continue to closely monitor patient.  Broady Lafoy, Blanchard Kelch

## 2012-10-28 NOTE — Progress Notes (Signed)
10/28/2012 0950 IV team unable to obtain IV access on right side. Ward Givens NP paged and made aware. Verbal orders received to hold am IV lasix until evaluated by MD. Orders enacted. Will continue to closely monitor patient.  [name] [name], [name] [name]

## 2012-10-28 NOTE — Evaluation (Signed)
Physical Therapy Evaluation Patient Details Name: [name] [name] MRN: [id number] DOB: [birth year] Today's Date: 10/28/2012 Time: 7829-5621 PT Time Calculation (min): 26 min  PT Assessment / Plan / Recommendation Clinical Impression  Pt is an 79 y.o. female who presents s/p NSTEMI, Takotsubo Syndrome and ARF.  Patient demonstrates limitations in functional mobility at this time secondary to weakness, deconditioning, balance deficits and poor activity tolerance. Patient lives alone and would benefit from [facility] SNF stay prior to discharge home to address deficits and maximize independence for safe transition home. Will continue to see acutely as indicated.     PT Assessment  Patient needs continued PT services    Follow Up Recommendations  SNF    Does the patient have the potential to tolerate intense rehabilitation      Barriers to Discharge Decreased caregiver support      Equipment Recommendations  Rolling walker with 5" wheels    Recommendations for Other Services     Frequency Min 2X/week    Precautions / Restrictions Restrictions Weight Bearing Restrictions: No   Pertinent Vitals/Pain No pain at this time; Low SpO2 on rm air 82% with minimal ambulation and positive fatigue and SOB, Placed back on 2 liters [place]      Mobility  Bed Mobility Bed Mobility: Supine to Sit;Sitting - Scoot to [place] of Bed;Sit to Supine Supine to Sit: 4: Min guard Sitting - Scoot to [place] of Bed: 4: Min guard Sit to Supine: 4: Min guard Details for Bed Mobility Assistance: increased time to perform, easily fatigued with mobility Transfers Transfers: Sit to Stand;Stand to Sit Sit to Stand: 4: Min guard Stand to Sit: 4: Min guard Details for Transfer Assistance: VCs for hand placement, some initial instability and positive dizziness Ambulation/Gait Ambulation/Gait Assistance: 4: Min guard Ambulation Distance (Feet): 18 Feet Assistive device: Rolling walker Ambulation/Gait Assistance Details: Patient with  some instability during ambulation, patient desaturated to 82% on rm air for very minimal ambulation. Cues for breathing techniques. Gait Pattern: Step-through pattern;Decreased stride length;Narrow base of support Gait velocity: decreased (increase risk for falls) General Gait Details: overall increased effort required for ambulation with low O2 saturation and positive for dizziness and fatigue when mobile.  Stairs: No    Exercises General Exercises - Lower Extremity Ankle Circles/Pumps: AROM;20 reps   PT Diagnosis: Difficulty walking;Generalized weakness  PT Problem List: Decreased strength;Decreased range of motion;Decreased activity tolerance;Decreased balance;Decreased mobility;Decreased knowledge of use of DME;Decreased safety awareness;Cardiopulmonary status limiting activity PT Treatment Interventions: DME instruction;Gait training;Stair training;Functional mobility training;Therapeutic activities;Therapeutic exercise;Balance training;Patient/family education   PT Goals Acute Rehab PT Goals PT Goal Formulation: With patient Time For Goal Achievement: 11/11/12 Potential to Achieve Goals: Good Pt will go Sit to Stand: Independently PT Goal: Sit to Stand - Progress: Goal set today Pt will Stand: Independently PT Goal: Stand - Progress: Goal set today Pt will Ambulate: >150 feet;with modified independence;with rolling walker PT Goal: Ambulate - Progress: Goal set today  Visit Information  Last PT Received On: 10/28/12 Assistance Needed: +1    Subjective Data  Subjective: I didn't realize how much I had been through Patient Stated Goal: to get stronger again   Prior Functioning  Home Living Lives With: Alone Available Help at Discharge: Family Type of Home: House Home Access: Level entry Home Layout: One level Bathroom Shower/Tub: Engineer, manufacturing systems: Standard Home Adaptive Equipment: None Prior Function Level of Independence: Independent Able to Take  Stairs?: Yes Driving: Yes Vocation: Retired Musician: HOH Dominant Hand: Right  Cognition  Cognition Arousal/Alertness: Awake/alert Behavior During Therapy: WFL for tasks assessed/performed Overall Cognitive Status: Within Functional Limits for tasks assessed    Extremity/Trunk Assessment Right Upper Extremity Assessment RUE ROM/Strength/Tone: Deficits RUE ROM/Strength/Tone Deficits: limted ROM at baseline RUE Sensation: History of peripheral neuropathy Left Upper Extremity Assessment LUE ROM/Strength/Tone: Deficits LUE ROM/Strength/Tone Deficits: limited ROM at baseline LUE Sensation: History of peripheral neuropathy Right Lower Extremity Assessment RLE ROM/Strength/Tone: Deficits;Due to pain RLE ROM/Strength/Tone Deficits: postive arthritic changes RLE Sensation: Deficits;History of peripheral neuropathy Left Lower Extremity Assessment LLE ROM/Strength/Tone: Deficits;Due to pain LLE ROM/Strength/Tone Deficits: positive arthritic cahnges L>R LLE Sensation: Deficits;History of peripheral neuropathy Trunk Assessment Trunk Assessment: Kyphotic   Balance Balance Balance Assessed: Yes Static Standing Balance Static Standing - Balance Support: Bilateral upper extremity supported;During functional activity Static Standing - Level of Assistance: 5: Stand by assistance;4: Min assist Static Standing - Comment/# of Minutes: Pt with some instability with modest lean to left requiring self correction despite use of rw.  End of Session PT - End of Session Equipment Utilized During Treatment: Gait belt;Oxygen Activity Tolerance: Patient limited by fatigue;Other (comment) (low O2 levels with activity, increased WOB) Patient left: in bed;with call bell/phone within reach Nurse Communication: Mobility status (Pt positive for dizziness, pt with low O2 sats, O2 replaced)  GP     Fabio Asa 10/28/2012, 9:34 AM Charlotte Crumb, PT DPT  [phone]

## 2012-10-28 NOTE — Procedures (Signed)
Successful US guided right thoracentesis. Yielded of hazy golden pleural fluid. Pt tolerated procedure well. No immediate complications.  Specimen was sent for culture as ordered. CXR ordered.  Brayton El PA-C 10/28/2012 4:05 PM

## 2012-10-29 ENCOUNTER — Inpatient Hospital Stay (HOSPITAL_COMMUNITY): Payer: Medicare Other

## 2012-10-29 DIAGNOSIS — J90 Pleural effusion, not elsewhere classified: Secondary | ICD-10-CM

## 2012-10-29 DIAGNOSIS — I5021 Acute systolic (congestive) heart failure: Secondary | ICD-10-CM

## 2012-10-29 DIAGNOSIS — K222 Esophageal obstruction: Secondary | ICD-10-CM

## 2012-10-29 DIAGNOSIS — I509 Heart failure, unspecified: Secondary | ICD-10-CM

## 2012-10-29 LAB — BASIC METABOLIC PANEL
CO2: 30 mEq/L (ref 19–32)
CO2: 30 mEq/L (ref 19–32)
Calcium: 9 mg/dL (ref 8.4–10.5)
Calcium: 9.4 mg/dL (ref 8.4–10.5)
Creatinine, Ser: 0.94 mg/dL (ref 0.50–1.10)
Glucose, Bld: 163 mg/dL — ABNORMAL HIGH (ref 70–99)
Glucose, Bld: 111 mg/dL — ABNORMAL HIGH (ref 70–99)
Sodium: 138 mEq/L (ref 135–145)
Sodium: 136 mEq/L (ref 135–145)

## 2012-10-29 MED ORDER — LEVOFLOXACIN 750 MG PO TABS
750.0000 mg | ORAL_TABLET | ORAL | Status: AC
  Administered 2012-10-31 – 2012-11-02 (×2): 750 mg via ORAL
  Filled 2012-10-29 (×2): qty 1

## 2012-10-29 NOTE — Care Management Note (Signed)
    Page 1 of 1   11/06/2012     2:50:20 PM   CARE MANAGEMENT NOTE 11/06/2012  Patient:  [name],[name]   Account Number:  [id number]  Date Initiated:  10/26/2012  Documentation initiated by:  Junius Creamer  Subjective/Objective Assessment:   adm w resp failure     Action/Plan:   lives alone   Anticipated DC Date:  10/31/2012   Anticipated DC Plan:  SKILLED NURSING FACILITY  In-house referral  Clinical Social Worker      DC Planning Services  CM consult      Choice offered to / List presented to:             Status of service:  Completed, signed off Medicare Important Message given?   (If response is "NO", the following Medicare IM given date fields will be blank) Date Medicare IM given:   Date Additional Medicare IM given:    Discharge Disposition:  SKILLED NURSING FACILITY  Per UR Regulation:  Reviewed for med. necessity/level of care/duration of stay  If discussed at Long Length of Stay Meetings, dates discussed:   10/27/2012  10/29/2012    Comments:  10/3012 [name] Whistler,RN,BSN [phone] PT FOR DC TO SNF TODAY, PER CSW ARRANGEMENTS.  10/29/12 [name] Shanker,RN,BSN [phone] PT ADM ON 10/24/12 WITH STEMI AND PNA.  PT DECONDITIONED, LIVES ALONE.  WILL NEED SNF FOR REHAB AT DC.  CSW FOLLOWING TO FACILITATE DC TO SNF WHEN MEDICALLY STABLE.

## 2012-10-29 NOTE — Progress Notes (Signed)
PULMONARY  / CRITICAL CARE MEDICINE  Name: [name] [name] MRN: [id number] DOB: 09-09-1922    ADMISSION DATE:  10/24/2012 CONSULTATION DATE:  10/24/2012  REFERRING MD :  Excell Seltzer PRIMARY SERVICE: Cardiology  CHIEF COMPLAINT:  Acute respiratory failure  BRIEF PATIENT DESCRIPTION: 79 y/o female with diverticulosis, COPD, and breast cancer was admitted to [facility] on 5/17 after acute respiratory failure.  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   Hx:  pneumonia in dec & jan & severe GERD  SIGNIFICANT EVENTS / STUDIES:  5/17 - Admit with acute resp fx, tako-tsubo cardiomyopathy 5/17 - LHC > Takotsubo syndrome with severe LV systolic dysfunction, mild non-obstructive CAD 5/18 - continued shock 5/22 - stable on floor, continued small pleural effusions, Rx for CAP  LINES / TUBES: 5/17 ETT >>5/18 5/17 L IJ CVL >>5/19  CULTURES: BC 5/17>>> ng Sputum 5/17>>>ng Urine strep ag >>neg Urine leg ag >>neg  ANTIBIOTICS: Levo 5/18>>> ceftraixone 5/18>>> 5/21 vanc 5/28>>>off    SUBJECTIVE: Pt reports episode of choking with difficulty getting up sputum. No other acute events.    VITAL SIGNS: Temp:  [97.9 F (36.6 C)-98.1 F (36.7 C)] 98.1 F (36.7 C) (05/22 0450) Pulse Rate:  [84-96] 89 (05/22 0751) Resp:  [18-19] 18 (05/22 0450) BP: (98-137)/(49-77) 98/57 mmHg (05/22 1025) SpO2:  [94 %-98 %] 96 % (05/22 0919) Weight:  [154 lb 12.2 oz (70.2 kg)] 154 lb 12.2 oz (70.2 kg) (05/22 0450)  INTAKE / OUTPUT: Intake/Output     05/21 0701 - 05/22 0700 05/22 0701 - 05/23 0700   P.O. 720    IV Piggyback     Total Intake(mL/kg) 720 (10.3)    Urine (mL/kg/hr) 4850 (2.9)    Total Output 4850     Net -4130            PHYSICAL EXAMINATION: Gen: chronically ill appearing, NAD walking  HEENT: NCAT, PERRL PULM: few scattered ronchi, resps even non labored  CV: RRR, no mgr, no JVD AB: BS infrequent,  soft, nontender, no hsm Ext: warm, no edema Derm: no rash or skin breakdown Neuro: non focal  LABS:  Recent Labs Lab 10/24/12 1700 10/25/12 0450 10/26/12 0400 10/27/12 0500 10/28/12 0530 10/29/12 0525  NA 134* 134* 136 135 134* 138  K 4.3 4.5 3.9 3.7 3.0* 3.7  CL 101 103 102 99 95* 95*  CO2 19 19 23 24 28 30   GLUCOSE 253* 218* 116* 145* 120* 163*  BUN 14 16 15 15 19 22   CREATININE 0.92 0.93 0.85 0.77 0.79 0.94  CALCIUM 8.2* 7.8* 8.2* 9.0 8.9 9.0  MG 1.5  --   --   --   --   --     Recent Labs Lab 10/26/12 0400 10/27/12 0500 10/28/12 0530  HGB 10.3* 10.8* 11.1*  HCT 30.4* 32.2* 32.6*  WBC 9.8 12.1* 7.3  PLT 128* 156 167    RADIOLOGY: Dg Chest 1 View  10/28/2012   *RADIOLOGY REPORT*  Clinical Data: Status post right-sided thoracentesis  CHEST - 1 VIEW  Comparison: 10/28/2012  Findings:  There has been interval decrease in volume of the right pleural effusion.  No pneumothorax after thoracentesis is noted.  The right upper lobe and left lower lobe infiltrates are again noted and appear unchanged from previous exam.  No new findings identified.  IMPRESSION:  1.  No pneumothorax identified after right-sided thoracentesis   Original  Report Authenticated By: Signa Kell, M.D.   Dg Chest 2 View  10/29/2012   *RADIOLOGY REPORT*  Clinical Data: Follow up pneumonia and pleural effusion.  CHEST - 2 VIEW  Comparison: 10/28/2012 and 10/26/2012.  Findings: The heart size and mediastinal contours are stable. There is aortic atherosclerosis.  The right upper lobe infiltrate and left lower lobe nodular densities are unchanged.  There are small bilateral pleural effusions.  No pneumothorax is identified. Surgical clips are present within the left axilla.  IMPRESSION: No significant change in the right upper lobe infiltrate and small pleural effusions.  Continued follow-up recommended.   Original Report Authenticated By: Carey Bullocks, M.D.   Dg Chest 2v Repeat Same Day  10/28/2012    *RADIOLOGY REPORT*  Clinical Data: Short of breath, weak, pneumonia  CHEST - 2 VIEW SAME DAY  Comparison: Prior chest x-ray 10/26/2012  Findings: Interval development of a moderate right and small left pleural effusions.  A small left effusion may have been present previously.  Persistent airspace disease throughout the right upper and to a lesser extent right lower lobes.  Stable cardiomegaly. Atherosclerosis of the transverse aorta. Surgical clips in the left axilla.  No acute osseous abnormality.  IMPRESSION: 1.  Interval development of a moderate right pleural (likely parapneumonic) effusion. 2.  Similar appearance of the right upper greater than lower lobe infiltrates consistent with the clinical history of pneumonia. Please note that can take 4 - 6 weeks for pneumonic infiltrates to resolve by conventional chest x-ray.  If the opacities persist beyond this time frame, recommend CT scan of the chest to evaluate for underlying malignancy. 3.  Probable stable trace left pleural effusion.   Original Report Authenticated By: Malachy Moan, M.D.   US Thoracentesis Asp Pleural Space W/img Guide  10/28/2012   *RADIOLOGY REPORT*  Clinical Data:  Pneumonia, right-sided pleural effusion.  A request for diagnostic and therapeutic thoracentesis  ULTRASOUND GUIDED right THORACENTESIS  Comparison:  None  An ultrasound guided thoracentesis was thoroughly discussed with the patient and questions answered.  The benefits, risks, alternatives and complications were also discussed.  The patient understands and wishes to proceed with the procedure.  Written consent was obtained.  Ultrasound was performed to localize and mark an adequate pocket of fluid in the right chest.  The area was then prepped and draped in the normal sterile fashion.  1% Lidocaine was used for local anesthesia.  Under ultrasound guidance a 19 gauge Yueh catheter was introduced.  Thoracentesis was performed.  The catheter was removed and a dressing  applied.  Complications:  None immediate  Findings: A total of approximately 280 ml of hazy golden pleural fluid was removed. A fluid sample was sent for laboratory analysis.  IMPRESSION: Successful ultrasound guided right thoracentesis yielding 280 ml of pleural fluid.  Read by Brayton El PA-C   Original Report Authenticated By: Tacey Ruiz, MD    ASSESSMENT / PLAN:  Acute respiratory failure  - in setting of acute pulmonary edema r/t Takotsubo Cardiomyopathy COPD - on O2 at home prior to admit at night RUL infiltrate - treat as CAP, but favor aspiration / GERD.  Note h/o recurrent pneumonia & GERD ? aspiration Moderate R pleural effusion - new 5/21, s/p thora 5/22 Aspiration  - distal esophageal stricture,  ? Peptic  P: -Rx for Cap with Levaquin for total of 7-10 days abx -Follow serial cxr -Follow effusion fluid cx -GI following for esophageal strictures / GERD, considering dilation -IS / pulmonary hygiene -  continue spiriva -consider PRN bronchodilator for d/c with hx of COPD   Cardiogenic shock from Takotsubo - shock resolved.  P:  -diuresis & lisinopril per Cardiology   Diverticulosis - not in flare GERD Esophageal Stricture P:   -pantoprazole -allow diet, GI following, appreciate input -will need EGD / esophageal dilation post discharge   Thrombocytopenia - rapid drop. resolved 5/21 P:  -f/u cbc  -monitor for bleeding   DISPO Will need SNF for discharge for rehab efforts.    Canary Brim, NP-C [facility] Pgr: [phone] or [phone]     Levy Pupa, MD, PhD 10/29/2012, 1:39 PM  Pulmonary and Critical Care [phone] or if no answer [phone]

## 2012-10-29 NOTE — Progress Notes (Signed)
[facility] PROGRESS NOTE Subjective patient reports that she is still SOB. She is out of the unit and now on the floor. She is tolerating a diet reasonably well with no dysphagia but admits that she is swallowing very little since she is anorexic. A cause for continuing dyspnea and chest x-ray showing pleural effusion, thoracentesis was performed yesterday 300 mL of fluid removed. Patient reports that she is still SOB while laying down.  Objective: Vital signs in last 24 hours: Temp:  [97.9 F (36.6 C)-98.1 F (36.7 C)] 98.1 F (36.7 C) (05/22 0450) Pulse Rate:  [84-96] 89 (05/22 0751) Resp:  [18-19] 18 (05/22 0450) BP: (102-137)/(49-77) 137/74 mmHg (05/22 0751) SpO2:  [94 %-98 %] 95 % (05/22 0450) Weight:  [70.2 kg (154 lb 12.2 oz)] 70.2 kg (154 lb 12.2 oz) (05/22 0450) Last BM Date: 10/27/12  Intake/Output from previous day: 05/21 0701 - 05/22 0700 In: 720 [P.O.:720] Out: 4850 [Urine:4850] Intake/Output this shift:    PE: General-- alert and talkative, still SOB   Lungs--diminish breath sounds throughout  Abdomen--soft and nontender  Lab Results:  Recent Labs  10/27/12 0500 10/28/12 0530  WBC 12.1* 7.3  HGB 10.8* 11.1*  HCT 32.2* 32.6*  PLT 156 167   BMET  Recent Labs  10/27/12 0500 10/28/12 0530 10/29/12 0525  NA 135 134* 138  K 3.7 3.0* 3.7  CL 99 95* 95*  CO2 24 28 30   CREATININE 0.77 0.79 0.94   LFT No results found for this basename: PROT, AST, ALT, ALKPHOS, BILITOT, BILIDIR, IBILI,  in the last 72 hours PT/INR No results found for this basename: LABPROT, INR,  in the last 72 hours PANCREAS No results found for this basename: LIPASE,  in the last 72 hours       Studies/Results: Dg Chest 1 View  10/28/2012   *RADIOLOGY REPORT*  Clinical Data: Status post right-sided thoracentesis  CHEST - 1 VIEW  Comparison: 10/28/2012  Findings:  There has been interval decrease in volume of the right pleural effusion.  No pneumothorax after  thoracentesis is noted.  The right upper lobe and left lower lobe infiltrates are again noted and appear unchanged from previous exam.  No new findings identified.  IMPRESSION:  1.  No pneumothorax identified after right-sided thoracentesis   Original Report Authenticated By: Signa Kell, M.D.   Dg Chest 2 View  10/29/2012   *RADIOLOGY REPORT*  Clinical Data: Follow up pneumonia and pleural effusion.  CHEST - 2 VIEW  Comparison: 10/28/2012 and 10/26/2012.  Findings: The heart size and mediastinal contours are stable. There is aortic atherosclerosis.  The right upper lobe infiltrate and left lower lobe nodular densities are unchanged.  There are small bilateral pleural effusions.  No pneumothorax is identified. Surgical clips are present within the left axilla.  IMPRESSION: No significant change in the right upper lobe infiltrate and small pleural effusions.  Continued follow-up recommended.   Original Report Authenticated By: Carey Bullocks, M.D.   Dg Chest 2v Repeat Same Day  10/28/2012   *RADIOLOGY REPORT*  Clinical Data: Short of breath, weak, pneumonia  CHEST - 2 VIEW SAME DAY  Comparison: Prior chest x-ray 10/26/2012  Findings: Interval development of a moderate right and small left pleural effusions.  A small left effusion may have been present previously.  Persistent airspace disease throughout the right upper and to a lesser extent right lower lobes.  Stable cardiomegaly. Atherosclerosis of the transverse aorta. Surgical clips in the left axilla.  No acute osseous abnormality.  IMPRESSION: 1.  Interval development of a moderate right pleural (likely parapneumonic) effusion. 2.  Similar appearance of the right upper greater than lower lobe infiltrates consistent with the clinical history of pneumonia. Please note that can take 4 - 6 weeks for pneumonic infiltrates to resolve by conventional chest x-ray.  If the opacities persist beyond this time frame, recommend CT scan of the chest to evaluate for  underlying malignancy. 3.  Probable stable trace left pleural effusion.   Original Report Authenticated By: Malachy Moan, M.D.   US Thoracentesis Asp Pleural Space W/img Guide  10/28/2012   *RADIOLOGY REPORT*  Clinical Data:  Pneumonia, right-sided pleural effusion.  A request for diagnostic and therapeutic thoracentesis  ULTRASOUND GUIDED right THORACENTESIS  Comparison:  None  An ultrasound guided thoracentesis was thoroughly discussed with the patient and questions answered.  The benefits, risks, alternatives and complications were also discussed.  The patient understands and wishes to proceed with the procedure.  Written consent was obtained.  Ultrasound was performed to localize and mark an adequate pocket of fluid in the right chest.  The area was then prepped and draped in the normal sterile fashion.  1% Lidocaine was used for local anesthesia.  Under ultrasound guidance a 19 gauge Yueh catheter was introduced.  Thoracentesis was performed.  The catheter was removed and a dressing applied.  Complications:  None immediate  Findings: A total of approximately 280 ml of hazy golden pleural fluid was removed. A fluid sample was sent for laboratory analysis.  IMPRESSION: Successful ultrasound guided right thoracentesis yielding 280 ml of pleural fluid.  Read by Brayton El PA-C   Original Report Authenticated By: Tacey Ruiz, MD    Medications: I have reviewed the patient's current medications.  Assessment/Plan: 1. Takotsubo Cardiomyopathy. Has been associated with cardiogenic shock was evidence of probable ST elevation MI  with ongoing CHF. Still issues with fluid overload and pleural effusions requiring thoracentesis. 2. Acute respiratory failure. Patient and debated for a while now has been excavated for 2 days. Question of this was due to aspiration pneumonia. On antibiotics. 3. Esophageal stricture. She has a long history of esophageal reflux symptoms and has been on omeprazole for some time.  She describes a long history of food material being difficult to swallow. Seems to be okay with current diet. I think she would benefit from esophageal dilatation, but will need to wait until all of her other problems have resolved and she is ready for discharge. Have discuss the procedure and the risk of perforation and bleeding with her.   Malik Ruffino JR,Keyleigh Manninen L 10/29/2012, 8:51 AM

## 2012-10-29 NOTE — Clinical Social Work Placement (Signed)
     Clinical Social Work Department CLINICAL SOCIAL WORK PLACEMENT NOTE 10/29/2012  Patient:  [name] [name],[name] [name]  Account Number:  [id number] Admit date:  10/24/2012  Clinical Social Worker:  Rozetta Nunnery, Theresia Majors  Date/time:  10/29/2012 02:27 PM  Clinical Social Work is seeking post-discharge placement for this patient at the following level of care:   SKILLED NURSING   (*CSW will update this form in Epic as items are completed)   10/29/2012  Patient/family provided with [facility] Department of Clinical Social Works list of facilities offering this level of care within the geographic area requested by the patient (or if unable, by the patients family).  10/29/2012  Patient/family informed of their freedom to choose among providers that offer the needed level of care, that participate in Medicare, Medicaid or managed care program needed by the patient, have an available bed and are willing to accept the patient.  10/29/2012  Patient/family informed of [facility] ownership interest in [facility], as well as of the fact that they are under no obligation to receive care at this facility.  PASARR submitted to EDS on 03/13/2009 PASARR number received from [employer] on 03/13/2009  FL2 transmitted to all facilities in geographic area requested by pt/family on  10/29/2012 FL2 transmitted to all facilities within larger geographic area on   Patient informed that his/her managed care company has contracts with or will negotiate with  certain facilities, including the following:     Patient/family informed of bed offers received:   Patient chooses bed at  Physician recommends and patient chooses bed at    Patient to be transferred to  on   Patient to be transferred to facility by   The following physician request were entered in Epic:   Additional Comments:

## 2012-10-29 NOTE — Progress Notes (Signed)
Subjective: Patient feels weak  No CP  Breathing is fair.  Still with productive cough. Objective: Filed Vitals:   10/28/12 1712 10/28/12 2020 10/29/12 0450 10/29/12 0751  BP: 124/77 132/63 102/63 137/74  Pulse: 96 92 85 89  Temp:   98.1 F (36.7 C)   TempSrc:   Oral   Resp:  18 18   Height:      Weight:   154 lb 12.2 oz (70.2 kg)   SpO2:  94% 95%    Weight change: -4 lb 0.6 oz (-1.831 kg)  Intake/Output Summary (Last 24 hours) at 10/29/12 0837 Last data filed at 10/29/12 6045  Gross per 24 hour  Intake    480 ml  Output   4500 ml  Net  -4020 ml    General: Alert, awake, oriented x3, in no acute distress Neck:  JVP is normal Heart: Regular rate and rhythm, without murmurs, rubs, gallops.  Lungs: Clear to auscultation.  No rales or wheezes. Exemities:  No edema.   Neuro: Grossly intact, nonfocal.  Tele:  Afib  80s Lab Results: Results for orders placed during the hospital encounter of 10/24/12 (from the past 24 hour(s))  BODY FLUID CULTURE     Status: None   Collection Time    10/28/12  4:11 PM      Result Value Range   Specimen Description PLEURAL FLUID CHEST RIGHT     Special Requests 120 ML FLUID     Gram Stain       Value: RARE WBC PRESENT,BOTH PMN AND MONONUCLEAR     NO ORGANISMS SEEN   Culture PENDING     Report Status PENDING    BASIC METABOLIC PANEL     Status: Abnormal   Collection Time    10/29/12  5:25 AM      Result Value Range   Sodium 138  135 - 145 mEq/L   Potassium 3.7  3.5 - 5.1 mEq/L   Chloride 95 (*) 96 - 112 mEq/L   CO2 30  19 - 32 mEq/L   Glucose, Bld 163 (*) 70 - 99 mg/dL   BUN 22  6 - 23 mg/dL   Creatinine, Ser 4.09  0.50 - 1.10 mg/dL   Calcium 9.0  8.4 - 81.1 mg/dL   GFR calc non Af Amer 53 (*) >90 mL/min   GFR calc Af Amer 61 (*) >90 mL/min    Studies/Results: @RISRSLT24 @  Medications: Reviewed  1.  Takosubos CM  Patient is recovering.  Echo with LVEF of 40%.  Plan to continue medical Rx.  On Coreg and Lisinopril.  WIll check  BNP  Still on IV lasix.  Volume satus improved  2  Ques Afib  WIl repeate EKG  ON ASA for now.  3.  Pulm  Followed by CCM  4.GI  Patient for possible dilation prior to d/c  Would be important if have to initiate antiov   @PROBHOSP @  LOS: 5 days   Dietrich Pates 10/29/2012, 8:37 AM

## 2012-10-29 NOTE — Clinical Social Work Psychosocial (Signed)
     Clinical Social Work Department BRIEF PSYCHOSOCIAL ASSESSMENT 10/29/2012  Patient:  [name] [name],[name] [name]     Account Number:  [id number]     Admit date:  10/24/2012  Clinical Social Worker:  Hulan Fray  Date/Time:  10/29/2012 02:16 PM  Referred by:  Care Management  Date Referred:  10/28/2012 Referred for  SNF Placement   Other Referral:   Interview type:  Patient Other interview type:    PSYCHOSOCIAL DATA Living Status:  ALONE Admitted from facility:   Level of care:   Primary support name:  Traniya Prichett Primary support relationship to patient:  CHILD, ADULT Degree of support available:   supportive    CURRENT CONCERNS Current Concerns  Post-Acute Placement   Other Concerns:    SOCIAL WORK ASSESSMENT / PLAN Clinical Social Worker received referral for patient needing short term SNF placement at discharge. CSW introduced self and explained reason for visit. Patient reported that she is agreeable for SNF placement and prefers [facility]. CSW provided SNF packet and encouraged patient to have second choice in mind as well. CSW inquired about daughter assisting with decision and patient reported that she could. Patient reported her daughter is a Engineer, civil (consulting) and [facility].    CSW will complete FL2 for MD's signature and will update patient and family when bed offers are given.   Assessment/plan status:  Psychosocial Support/Ongoing Assessment of Needs Other assessment/ plan:   Information/referral to community resources:   SNF packet    PATIENTS/FAMILYS RESPONSE TO PLAN OF CARE: Patient is agreeable for SNF placement, with preference for [facility]. Patient was appreciative of CSW's visit and assistance with SNF placement.

## 2012-10-30 ENCOUNTER — Inpatient Hospital Stay (HOSPITAL_COMMUNITY): Payer: Medicare Other

## 2012-10-30 LAB — CULTURE, BLOOD (SINGLE)

## 2012-10-30 MED ORDER — POTASSIUM CHLORIDE CRYS ER 20 MEQ PO TBCR
20.0000 meq | EXTENDED_RELEASE_TABLET | Freq: Every day | ORAL | Status: DC
  Administered 2012-10-30 – 2012-11-06 (×8): 20 meq via ORAL
  Filled 2012-10-30 (×8): qty 1

## 2012-10-30 NOTE — Progress Notes (Signed)
PCCM Follow up Note  Doing well s/p thoracentesis for R parapneumonic effusion.  Please call if we can help you through the weekend. We will check on her next week   Levy Pupa, MD, PhD 10/30/2012, 12:27 PM [facility] [phone] or if no answer [phone]

## 2012-10-30 NOTE — Progress Notes (Signed)
Physical Therapy Treatment Patient Details Name: [name] [name] MRN: [id number] DOB: 1923-05-06 Today's Date: 10/30/2012 Time: 1478-2956 PT Time Calculation (min): 25 min  PT Assessment / Plan / Recommendation Comments on Treatment Session  Pt demonstrates some improvements in mobility and activity tolerance today. Will continue to see and progress further as tolerated. Rec SNF to maximze ability and independence at discharge.    Follow Up Recommendations  SNF     Does the patient have the potential to tolerate intense rehabilitation     Barriers to Discharge        Equipment Recommendations  Rolling walker with 5" wheels    Recommendations for Other Services    Frequency Min 2X/week   Plan Discharge plan remains appropriate    Precautions / Restrictions Restrictions Weight Bearing Restrictions: No   Pertinent Vitals/Pain No pain; +dizziness BP 120s/60s; O2 on rm air 91-94%; 98% on 2 liters    Mobility  Bed Mobility Bed Mobility: Supine to Sit;Sitting - Scoot to [place] of Bed;Sit to Supine Supine to Sit: 4: Min guard Sitting - Scoot to [place] of Bed: 4: Min guard Sit to Supine: 4: Min guard Details for Bed Mobility Assistance: increased time to perform, easily fatigued with mobility Transfers Transfers: Sit to Stand;Stand to Sit Sit to Stand: 4: Min guard;From bed;From toilet Stand to Sit: 4: Min guard;To bed;To toilet Details for Transfer Assistance: VCs for hand placement; difficulty from toilet requiring multiple attempts to stand Ambulation/Gait Ambulation/Gait Assistance: 4: Min guard Ambulation Distance (Feet): 85 Feet Assistive device: Rolling walker Ambulation/Gait Assistance Details: Patient required multiple standing rest breaks and reports continuous dizziness throughout ambulation. Vitals assess )2 on rm air 93% with BP 121/68 seated EOB post activty Gait Pattern: Step-through pattern;Decreased stride length;Narrow base of support Gait velocity: decreased (increase  risk for falls) General Gait Details: overall increased effort required for ambulation with low O2 saturation and positive for dizziness and fatigue when mobile.  Stairs: No    Exercises General Exercises - Lower Extremity Ankle Circles/Pumps: AROM;20 reps     PT Goals Acute Rehab PT Goals PT Goal Formulation: With patient Time For Goal Achievement: 11/11/12 Potential to Achieve Goals: Good Pt will go Sit to Stand: Independently PT Goal: Sit to Stand - Progress: Progressing toward goal Pt will Stand: Independently PT Goal: Stand - Progress: Progressing toward goal Pt will Ambulate: >150 feet;with modified independence;with rolling walker PT Goal: Ambulate - Progress: Progressing toward goal  Visit Information  Last PT Received On: 10/30/12 Assistance Needed: +1    Subjective Data  Subjective: I think i need to pee Patient Stated Goal: to get stronger again   Cognition  Cognition Arousal/Alertness: Awake/alert Behavior During Therapy: WFL for tasks assessed/performed Overall Cognitive Status: Within Functional Limits for tasks assessed    Balance  Balance Balance Assessed: Yes Static Standing Balance Static Standing - Balance Support: Bilateral upper extremity supported;During functional activity Static Standing - Level of Assistance: 5: Stand by assistance;4: Min assist Static Standing - Comment/# of Minutes: 3 mintues with stand by assist and cues to not abandon rw  End of Session PT - End of Session Equipment Utilized During Treatment: Gait belt;Oxygen Activity Tolerance: Patient limited by fatigue;Other (comment) (low O2 levels with activity, increased WOB) Patient left: in bed;with call bell/phone within reach Nurse Communication: Mobility status (Pt positive for dizziness, pt with low O2 sats, O2 replaced)   GP     Fabio Asa 10/30/2012, 1:32 PM Charlotte Crumb, PT DPT  [phone]

## 2012-10-30 NOTE — Progress Notes (Signed)
Clinical Social Worker spoke with pt and provided bed offers; pt would like to dc to [facility] at dc.  CSW updated SNF.  If pt is dc'd over weekend, weekend admissions person is Clydie Braun [phone]).  CSW to continue to follow and assist as needed.    Angelia Mould, MSW, [place] [phone]

## 2012-10-30 NOTE — Progress Notes (Signed)
[facility] PROGRESS NOTE Subjective Eating breakfast,  Denies trouble swallowing at this time, Still SOB. ? Of SNF raised due to overall weakness.  Objective: Vital signs in last 24 hours: Temp:  [97.2 F (36.2 C)-98.6 F (37 C)] 97.5 F (36.4 C) (05/23 0538) Pulse Rate:  [83-104] 94 (05/23 0538) Resp:  [16-18] 18 (05/23 0538) BP: (98-123)/(49-79) 123/79 mmHg (05/23 0538) SpO2:  [92 %-96 %] 92 % (05/23 0538) Last BM Date: 10/27/12  Intake/Output from previous day: 05/22 0701 - 05/23 0700 In: 840 [P.O.:840] Out: 2450 [Urine:2450] Intake/Output this shift:      Lab Results:  Recent Labs  10/28/12 0530  WBC 7.3  HGB 11.1*  HCT 32.6*  PLT 167   BMET  Recent Labs  10/28/12 0530 10/29/12 0525 10/29/12 1620  NA 134* 138 136  K 3.0* 3.7 3.9  CL 95* 95* 93*  CO2 28 30 30   CREATININE 0.79 0.94 1.00   LFT No results found for this basename: PROT, AST, ALT, ALKPHOS, BILITOT, BILIDIR, IBILI,  in the last 72 hours PT/INR No results found for this basename: LABPROT, INR,  in the last 72 hours PANCREAS No results found for this basename: LIPASE,  in the last 72 hours       Studies/Results: Dg Chest 1 View  10/28/2012   *RADIOLOGY REPORT*  Clinical Data: Status post right-sided thoracentesis  CHEST - 1 VIEW  Comparison: 10/28/2012  Findings:  There has been interval decrease in volume of the right pleural effusion.  No pneumothorax after thoracentesis is noted.  The right upper lobe and left lower lobe infiltrates are again noted and appear unchanged from previous exam.  No new findings identified.  IMPRESSION:  1.  No pneumothorax identified after right-sided thoracentesis   Original Report Authenticated By: Signa Kell, M.D.   Dg Chest 2 View  10/30/2012   *RADIOLOGY REPORT*  Clinical Data: Weakness and shortness of breath.  CHEST - 2 VIEW  Comparison: 10/29/2012.  Findings: The cardiac silhouette, mediastinal and hilar contours are stable.  Stable left  basilar density, right upper lobe infiltrate and small effusions.  IMPRESSION: Stable left basilar density, right upper lobe infiltrate and small effusions.   Original Report Authenticated By: Rudie Meyer, M.D.   Dg Chest 2 View  10/29/2012   *RADIOLOGY REPORT*  Clinical Data: Follow up pneumonia and pleural effusion.  CHEST - 2 VIEW  Comparison: 10/28/2012 and 10/26/2012.  Findings: The heart size and mediastinal contours are stable. There is aortic atherosclerosis.  The right upper lobe infiltrate and left lower lobe nodular densities are unchanged.  There are small bilateral pleural effusions.  No pneumothorax is identified. Surgical clips are present within the left axilla.  IMPRESSION: No significant change in the right upper lobe infiltrate and small pleural effusions.  Continued follow-up recommended.   Original Report Authenticated By: Carey Bullocks, M.D.   US Thoracentesis Asp Pleural Space W/img Guide  10/28/2012   *RADIOLOGY REPORT*  Clinical Data:  Pneumonia, right-sided pleural effusion.  A request for diagnostic and therapeutic thoracentesis  ULTRASOUND GUIDED right THORACENTESIS  Comparison:  None  An ultrasound guided thoracentesis was thoroughly discussed with the patient and questions answered.  The benefits, risks, alternatives and complications were also discussed.  The patient understands and wishes to proceed with the procedure.  Written consent was obtained.  Ultrasound was performed to localize and mark an adequate pocket of fluid in the right chest.  The area was then prepped and draped in the normal sterile  fashion.  1% Lidocaine was used for local anesthesia.  Under ultrasound guidance a 19 gauge Yueh catheter was introduced.  Thoracentesis was performed.  The catheter was removed and a dressing applied.  Complications:  None immediate  Findings: A total of approximately 280 ml of hazy golden pleural fluid was removed. A fluid sample was sent for laboratory analysis.  IMPRESSION:  Successful ultrasound guided right thoracentesis yielding 280 ml of pleural fluid.  Read by Brayton El PA-C   Original Report Authenticated By: Tacey Ruiz, MD    Medications: I have reviewed the patient's current medications.  Assessment/Plan: 1. Dysphagia due to Esoph Stricture. Long history of GERD and dysphagia. Will need dilation. ? Is when. Discussed with her including risks of bleeding and perforation. Will check over the weekend.   [name] [name],[name] [name] 10/30/2012, 8:00 AM

## 2012-10-30 NOTE — Progress Notes (Signed)
Patient ID: Vanna Sailer, female   DOB: [birth year], 79 y.o.   MRN: [id number]   Patient Name: [name] [name] Date of Encounter: 10/30/2012    SUBJECTIVE She is starting to get out of bed and ambulate. She will need nursing facility with rehabilitation. Good diuresis yesterday. Chest x-ray is stable. Status post thoracentesis. She still has some orthopnea.  CURRENT MEDS . aspirin  81 mg Oral Daily  . carvedilol  3.125 mg Oral BID WC  . furosemide  40 mg Intravenous BID  . [START ON 10/31/2012] levofloxacin  750 mg Oral Q48H  . lisinopril  2.5 mg Oral Daily  . mupirocin ointment  1 application Nasal BID  . pantoprazole  40 mg Oral Q1200  . tiotropium  18 mcg Inhalation Daily  . vitamin B-12  1,000 mcg Oral Daily  . vitamin C  500 mg Oral Daily    OBJECTIVE  Filed Vitals:   10/29/12 1800 10/29/12 1944 10/30/12 0538 10/30/12 0812  BP: 123/57 109/61 123/79 106/63  Pulse: 99 83 94 105  Temp:  98.6 F (37 C) 97.5 F (36.4 C)   TempSrc:  Oral Oral   Resp:  18 18   Height:      Weight:      SpO2:  94% 92%     Intake/Output Summary (Last 24 hours) at 10/30/12 0904 Last data filed at 10/30/12 0500  Gross per 24 hour  Intake    600 ml  Output   2450 ml  Net  -1850 ml   Filed Weights   10/27/12 0500 10/28/12 0438 10/29/12 0450  Weight: 162 lb 0.6 oz (73.5 kg) 158 lb 12.8 oz (72.031 kg) 154 lb 12.2 oz (70.2 kg)    PHYSICAL EXAM  General: Pleasant, NAD. Neuro: Alert and oriented X 3. Moves all extremities spontaneously. Psych: Normal affect. HEENT:  Normal  Neck: Supple without bruits or JVD. Lungs:  Resp regular and unlabored, CTA. Heart: RRR no s3, s4, or murmurs. Abdomen: Soft, non-tender, non-distended, BS + x 4.  Extremities: No clubbing, cyanosis or edema. DP/PT/Radials 2+ and equal bilaterally.  Accessory Clinical Findings  CBC  Recent Labs  10/28/12 0530  WBC 7.3  HGB 11.1*  HCT 32.6*  MCV 93.7  PLT 167   Basic Metabolic Panel  Recent Labs  10/29/12 0525  10/29/12 1620  NA 138 136  K 3.7 3.9  CL 95* 93*  CO2 30 30  GLUCOSE 163* 111*  BUN 22 23  CREATININE 0.94 1.00  CALCIUM 9.0 9.4   Liver Function Tests No results found for this basename: AST, ALT, ALKPHOS, BILITOT, PROT, ALBUMIN,  in the last 72 hours No results found for this basename: LIPASE, AMYLASE,  in the last 72 hours Cardiac Enzymes No results found for this basename: CKTOTAL, CKMB, CKMBINDEX, TROPONINI,  in the last 72 hours BNP No components found with this basename: POCBNP,  D-Dimer No results found for this basename: DDIMER,  in the last 72 hours Hemoglobin A1C No results found for this basename: HGBA1C,  in the last 72 hours Fasting Lipid Panel No results found for this basename: CHOL, HDL, LDLCALC, TRIG, CHOLHDL, LDLDIRECT,  in the last 72 hours Thyroid Function Tests No results found for this basename: TSH, T4TOTAL, FREET3, T3FREE, THYROIDAB,  in the last 72 hours  TELE  Normal sinus rate  ECG    Radiology/Studies  Dg Chest 1 View  10/28/2012   *RADIOLOGY REPORT*  Clinical Data: Status post right-sided thoracentesis  CHEST - 1  VIEW  Comparison: 10/28/2012  Findings:  There has been interval decrease in volume of the right pleural effusion.  No pneumothorax after thoracentesis is noted.  The right upper lobe and left lower lobe infiltrates are again noted and appear unchanged from previous exam.  No new findings identified.  IMPRESSION:  1.  No pneumothorax identified after right-sided thoracentesis   Original Report Authenticated By: Signa Kell, M.D.   Dg Chest 2 View  10/30/2012   *RADIOLOGY REPORT*  Clinical Data: Weakness and shortness of breath.  CHEST - 2 VIEW  Comparison: 10/29/2012.  Findings: The cardiac silhouette, mediastinal and hilar contours are stable.  Stable left basilar density, right upper lobe infiltrate and small effusions.  IMPRESSION: Stable left basilar density, right upper lobe infiltrate and small effusions.   Original Report  Authenticated By: Rudie Meyer, M.D.   Dg Chest 2 View  10/29/2012   *RADIOLOGY REPORT*  Clinical Data: Follow up pneumonia and pleural effusion.  CHEST - 2 VIEW  Comparison: 10/28/2012 and 10/26/2012.  Findings: The heart size and mediastinal contours are stable. There is aortic atherosclerosis.  The right upper lobe infiltrate and left lower lobe nodular densities are unchanged.  There are small bilateral pleural effusions.  No pneumothorax is identified. Surgical clips are present within the left axilla.  IMPRESSION: No significant change in the right upper lobe infiltrate and small pleural effusions.  Continued follow-up recommended.   Original Report Authenticated By: Carey Bullocks, M.D.   Dg Esophagus  10/26/2012   *RADIOLOGY REPORT*  Clinical Data: Dysphagia with regurgitation.  History of gastroesophageal reflux disease.  ESOPHOGRAM / BARIUM SWALLOW / BARIUM TABLET STUDY  Technique:  Combined double contrast and single contrast examination performed using effervescent crystals, thick barium liquid, and thin barium liquid.  The patient was observed with fluoroscopy swallowing a 13mm barium sulphate tablet.  Fluoroscopy time:  1 minute 9 seconds.  Comparison:  Portable chest 10/26/2012.  Findings:  Examination is mildly limited by the patient's limited mobility.  Silent laryngeal penetration was noted.  There was no gross aspiration into the tracheobronchial tree.  There is mild presbyesophagus.  There is a small hiatal hernia associated with a small distal esophageal stricture.  No mucosal ulceration is identified.  However, this distal stricture prevented the passage of a 13 mm barium tablet (observed intermittently over approximately 3 mm of fluoroscopy).  IMPRESSION:  1.  Silent laryngeal penetration.  No gross aspiration identified. Consider dedicated speech therapy evaluation. 2.  Mild presbyesophagus. 3.  Distal esophageal stricture prevents the passage of a 13 mm barium tablet. This is  associated with a small hiatal hernia and is likely a peptic stricture.   Original Report Authenticated By: Carey Bullocks, M.D.   Dg Chest 2v Repeat Same Day  10/28/2012   *RADIOLOGY REPORT*  Clinical Data: Short of breath, weak, pneumonia  CHEST - 2 VIEW SAME DAY  Comparison: Prior chest x-ray 10/26/2012  Findings: Interval development of a moderate right and small left pleural effusions.  A small left effusion may have been present previously.  Persistent airspace disease throughout the right upper and to a lesser extent right lower lobes.  Stable cardiomegaly. Atherosclerosis of the transverse aorta. Surgical clips in the left axilla.  No acute osseous abnormality.  IMPRESSION: 1.  Interval development of a moderate right pleural (likely parapneumonic) effusion. 2.  Similar appearance of the right upper greater than lower lobe infiltrates consistent with the clinical history of pneumonia. Please note that can take 4 - 6  weeks for pneumonic infiltrates to resolve by conventional chest x-ray.  If the opacities persist beyond this time frame, recommend CT scan of the chest to evaluate for underlying malignancy. 3.  Probable stable trace left pleural effusion.   Original Report Authenticated By: Malachy Moan, M.D.   Dg Chest Port 1 View  10/26/2012   *RADIOLOGY REPORT*  Clinical Data: Pneumonia.  PORTABLE CHEST - 1 VIEW  Comparison: 10/25/2012.  Findings: The left IJ catheter is stable.  The endotracheal tube and NG tubes have been removed.  There are persistent right upper lobe and right lower lobe infiltrates. Persistent left lower lobe atelectasis.  No pneumothorax.  IMPRESSION: Removal of ET and NG tubes. Persistent right upper lobe and right lower lobe infiltrates.   Original Report Authenticated By: Rudie Meyer, M.D.   Dg Chest Port 1 View  10/25/2012   *RADIOLOGY REPORT*  Clinical Data: Pulmonary infiltrates.  PORTABLE CHEST - 1 VIEW  Comparison: 10/24/2012  Findings: Endotracheal tube, NG tube,  and central venous catheter in place, essentially unchanged.  The infiltrate at the right base has slightly progressed.  Infiltrate in the right upper lobe is unchanged.  There is new increased density at the left base which I suspect is a combination of effusion and atelectasis.  Pulmonary vascularity heart size are normal.  IMPRESSION: New effusion and atelectasis at the left base.  Slight increased infiltrate at the right base.  No change in the right upper lobe infiltrate.   Original Report Authenticated By: Francene Boyers, M.D.   Portable Chest Xray  10/24/2012   *RADIOLOGY REPORT*  Clinical Data: Endotracheal tube and central venous catheter placement.  PORTABLE CHEST - 1 VIEW  Comparison: None.  Findings: The endotracheal tube is 5.6 cm above the carina.  Left IJ central venous catheter terminates at the junction of the distal superior vena cava and right atrium.  There is extensive airspace disease in the right upper lobe.  Lesser degree of airspace disease is seen at the medial right lung base.  There is a focal opacity at the left lung apex could be due to airspace disease and/or chronic pleuroparenchymal thickening.  There are no prior studies for comparison.  There is a faint patchy opacity at the lateral left lung base suspicious for focal airspace disease.  Negative for pneumothorax.  Heart size is within normal limits.  There is atherosclerotic calcification of the thoracic aortic arch.  The right costophrenic angle is blunted.  Surgical clips in the left axilla.  IMPRESSION:  1.  Bilateral airspace disease, most prominent in the right upper lobe is suspicious for multilobar pneumonia. 2.  Satisfactory position of left IJ central venous catheter and endotracheal tube. 3.  Negative for pneumothorax. 4.  Suspect small right pleural effusion.   Original Report Authenticated By: Britta Mccreedy, M.D.   US Thoracentesis Asp Pleural Space W/img Guide  10/28/2012   *RADIOLOGY REPORT*  Clinical Data:   Pneumonia, right-sided pleural effusion.  A request for diagnostic and therapeutic thoracentesis  ULTRASOUND GUIDED right THORACENTESIS  Comparison:  None  An ultrasound guided thoracentesis was thoroughly discussed with the patient and questions answered.  The benefits, risks, alternatives and complications were also discussed.  The patient understands and wishes to proceed with the procedure.  Written consent was obtained.  Ultrasound was performed to localize and mark an adequate pocket of fluid in the right chest.  The area was then prepped and draped in the normal sterile fashion.  1% Lidocaine was used for local  anesthesia.  Under ultrasound guidance a 19 gauge Yueh catheter was introduced.  Thoracentesis was performed.  The catheter was removed and a dressing applied.  Complications:  None immediate  Findings: A total of approximately 280 ml of hazy golden pleural fluid was removed. A fluid sample was sent for laboratory analysis.  IMPRESSION: Successful ultrasound guided right thoracentesis yielding 280 ml of pleural fluid.  Read by Brayton El PA-C   Original Report Authenticated By: Tacey Ruiz, MD    ASSESSMENT AND PLAN  Principal Problem:   Takotsubo syndrome Active Problems:   Community acquired pneumonia   Acute systolic CHF (congestive heart failure)   Acute respiratory failure   Hypokalemia   Cardiogenic shock   GERD (gastroesophageal reflux disease)   Esophageal stricture   Pleural effusion     continue diuresis. Check electrolytes in the morning. Elective esophageal dilatation per Dr. Randa Evens. She will need rehabilitation center at discharge. Case management working on this. Increase activity with assistance.  Signed, Valera Castle MD

## 2012-10-31 DIAGNOSIS — K219 Gastro-esophageal reflux disease without esophagitis: Secondary | ICD-10-CM

## 2012-10-31 DIAGNOSIS — I4891 Unspecified atrial fibrillation: Secondary | ICD-10-CM | POA: Diagnosis present

## 2012-10-31 LAB — CULTURE, BLOOD (ROUTINE X 2): Culture: NO GROWTH

## 2012-10-31 LAB — BASIC METABOLIC PANEL
BUN: 22 mg/dL (ref 6–23)
GFR calc non Af Amer: 53 mL/min — ABNORMAL LOW (ref >90)
Glucose, Bld: 123 mg/dL — ABNORMAL HIGH (ref 70–99)
Potassium: 4 mEq/L (ref 3.5–5.1)

## 2012-10-31 MED ORDER — FUROSEMIDE 40 MG PO TABS
40.0000 mg | ORAL_TABLET | Freq: Every day | ORAL | Status: DC
  Administered 2012-10-31 – 2012-11-06 (×7): 40 mg via ORAL
  Filled 2012-10-31 (×7): qty 1

## 2012-10-31 MED ORDER — FLUTICASONE PROPIONATE 50 MCG/ACT NA SUSP
2.0000 | Freq: Every day | NASAL | Status: DC
  Administered 2012-10-31 – 2012-11-06 (×7): 2 via NASAL
  Filled 2012-10-31: qty 16

## 2012-10-31 MED ORDER — HYDROCORTISONE 0.5 % EX OINT
TOPICAL_OINTMENT | Freq: Two times a day (BID) | CUTANEOUS | Status: DC
  Administered 2012-10-31 – 2012-11-05 (×9): via TOPICAL
  Filled 2012-10-31: qty 28.35

## 2012-10-31 MED ORDER — CARVEDILOL 12.5 MG PO TABS
12.5000 mg | ORAL_TABLET | Freq: Two times a day (BID) | ORAL | Status: DC
  Administered 2012-10-31 – 2012-11-01 (×3): 12.5 mg via ORAL
  Filled 2012-10-31 (×6): qty 1

## 2012-10-31 MED ORDER — LORATADINE 10 MG PO TABS
10.0000 mg | ORAL_TABLET | Freq: Every day | ORAL | Status: DC
  Administered 2012-10-31 – 2012-11-06 (×7): 10 mg via ORAL
  Filled 2012-10-31 (×7): qty 1

## 2012-10-31 NOTE — Progress Notes (Signed)
Pt tolerated breakfast and lunch intake 75 & 100%, states its slow but going much better with swallowing and appetite improved. [name]

## 2012-10-31 NOTE — Progress Notes (Signed)
Patient ID: [name] [name], female   DOB: 09-18-1922, 79 y.o.   MRN: [id number]  Sitting on the side of the bed complaining of burning in her nose. States that she swallowed solid food for breakfast with occasional sensation of pressure in her chest. Reports difficulty initiating swallow of pills.   Feels that her breathing is better. Vitals reviewed. Will request nursing to document tolerance of meals.  Will hold off on EGD with dilation until more stable medically and will reassess next week for timing of the endoscopy with dilation of her esophageal stricture.

## 2012-10-31 NOTE — Progress Notes (Signed)
  [name] [name]  79 y.o.  female  Subjective: Patient complains that she just doesn't feel good today, but denies chest pain or dyspnea. She feels that oxygen continues to help her breathing. Her major complaint is a burning discomfort inside both nares.  She has walked in the halls with assistance.  Allergy: Sulfa antibiotics  Objective: Vital signs in last 24 hours: Temp:  [97.8 F (36.6 C)-98.5 F (36.9 C)] 98.5 F (36.9 C) (05/24 1191) Pulse Rate:  [90-94] 94 (05/24 0614) Resp:  [18-24] 20 (05/24 0614) BP: (102-112)/(61-64) 109/64 mmHg (05/24 1015) SpO2:  [93 %-99 %] 99 % (05/24 0838) Weight:  [68.04 kg (150 lb)] 68.04 kg (150 lb) (05/24 0614)  68.04 kg (150 lb) Body mass index is 24.22 kg/(m^2).  Weight change:  Last BM Date: 10/27/12  Intake/Output from previous day: 05/23 0701 - 05/24 0700 In: 360 [P.O.:360] Out: 1100 [Urine:1100] Total I/O since admission:  -9.7 L; Weight: -6 kg since admission  General- Well developed; no acute distress Neck- No JVD, no carotid bruits Lungs- clear lung fields; normal I:E ratio; mild kyphosis Cardiovascular- normal PMI; normal S1 and S2; rapid irregular rhythm Abdomen- normal bowel sounds; soft and non-tender without masses or organomegaly Skin- Warm, no significant lesions Extremities- Nl distal pulses; no edema; thin skin with multiple ecchymoses  Lab Results: BMET:  Recent Labs  10/29/12 1620 10/31/12 0530  NA 136 137  K 3.9 4.0  CL 93* 93*  CO2 30 32  GLUCOSE 111* 123*  BUN 23 22  CREATININE 1.00 0.94  CALCIUM 9.4 9.6   Telemetry: Atrial fibrillation with ventricular rate frequently exceeding 120 bpm  Medications:  I have reviewed the patient's current medications. Scheduled: . aspirin  81 mg Oral Daily  . carvedilol  3.125 mg Oral BID WC  . furosemide  40 mg Intravenous BID  . levofloxacin  750 mg Oral Q48H  . lisinopril  2.5 mg Oral Daily  . pantoprazole  40 mg Oral Q1200  . potassium chloride  20 mEq Oral Daily   . tiotropium  18 mcg Inhalation Daily  . vitamin B-12  1,000 mcg Oral Daily  . vitamin C  500 mg Oral Daily    Assessment/Plan: Takotsubo's cardiomyopathy: Recovering well; congestive heart failure appears to be controlled; LV dysfunction was only mild at presentation and will likely revert to normal. Medications will be adjusted including conversion to oral diuretic.  Atrial fibrillation: Heart rate control is suboptimal. Dose of beta blocker will be increased. Patient previously was treated with aspirin, but this was discontinued and do to excessive bleeding after a traumatic laceration. She will require anticoagulation, but initiation of this will be deferred until after her GI procedure.  Pneumonia and parapneumonic effusion: Resolving; appreciate assistance from Pulmonary.  Esophageal stricture: Awaiting procedure on Monday. She can be discharged to a SNF thereafter.    LOS: 7 days   [place] Bing 10/31/2012, 10:29 AM

## 2012-11-01 LAB — BODY FLUID CULTURE

## 2012-11-01 MED ORDER — SODIUM CHLORIDE 0.9 % IJ SOLN
3.0000 mL | Freq: Two times a day (BID) | INTRAMUSCULAR | Status: DC
  Administered 2012-11-01 – 2012-11-05 (×9): 3 mL via INTRAVENOUS

## 2012-11-01 MED ORDER — LISINOPRIL 2.5 MG PO TABS
2.5000 mg | ORAL_TABLET | Freq: Two times a day (BID) | ORAL | Status: DC
  Administered 2012-11-01 – 2012-11-03 (×5): 2.5 mg via ORAL
  Filled 2012-11-01 (×7): qty 1

## 2012-11-01 NOTE — Progress Notes (Signed)
Pt resting in chair, complained to NT that she had chest pain again when up to bathroom and back to her chair again she did not report until now, denies chest pain at present, bp  98/70  O2  Replaced at  2 liters  sats 98 %. Pt complaint of more difficulty swallowing food on today, feels like "it gets stuck". Dr. Kirtland Bouchard made of concerns [name]

## 2012-11-01 NOTE — Accreditation Note (Signed)
Pt informed RN that she had a brief episode of chest pain while in bathroom this am, " pain just went away".  Reinforced with pt to notify rn at any time she has pain. Will monitor closely [name]

## 2012-11-01 NOTE — Progress Notes (Signed)
SUBJECTIVE:Feeling better. Denies chest pain or DOE.  Principal Problem:   Takotsubo syndrome Active Problems:   Community acquired pneumonia   Acute systolic CHF (congestive heart failure)   Acute respiratory failure   Hypokalemia   Cardiogenic shock   GERD (gastroesophageal reflux disease)   Esophageal stricture   Pleural effusion   Atrial fibrillation  LABS: Basic Metabolic Panel:  Recent Labs  16/10/96 1620 10/31/12 0530  NA 136 137  K 3.9 4.0  CL 93* 93*  CO2 30 32  GLUCOSE 111* 123*  BUN 23 22  CREATININE 1.00 0.94  CALCIUM 9.4 9.6   PHYSICAL EXAM BP 106/54  Pulse 82  Temp(Src) 97.5 F (36.4 C) (Oral)  Resp 18  Ht 5\' 6"  (1.676 m)  Wt 150 lb 9.2 oz (68.3 kg)  BMI 24.31 kg/m2  SpO2 92% General: Well developed, well nourished, in no acute distress Head: Eyes PERRLA, No xanthomas.   Normal cephalic and atramatic  Lungs: Mild bibasilar crackles without wheezes. Occasional coughing.  Heart: HRIR S1 S2, soft systolic murmur. .  Pulses are 2+ & equal radially..            No carotid bruit. No JVD.  No abdominal bruits. No femoral bruits. Abdomen: Bowel sounds are positive, abdomen soft and non-tender without masses or                  Hernia's noted. Msk:  Back normal, normal gait. Normal strength and tone for age. Extremities: No clubbing, cyanosis or edema.Venous stasis skin changes. Diminished  DP bilaterally. Neuro: Alert and oriented X 3. Psych:  Good affect, responds appropriately  TELEMETRY: Reviewed telemetry pt in :Atrial fibrillation in the 70's.  ASSESSMENT AND PLAN:  1. Atrial fibrillation: Heart rate is well controlled on carvedilol 12.5 mg BID. No anticoagulation until after EDG is completed next week. Continue on ASA.  2. Takotsubo's cardiomypathy: Denies recurrent chest pain. Continues on lisinopril and BB. Echo on 10/26/2012 demonstrates EF of 40%.   3. Pneumonia: Improvement of symptoms. No longer on oxygen. Continues on levaquin.  4.  Esophageal Stricture: Planned EDG per GI early next week. Still having some swallowing difficulties, but no choking or aspiration.  5. Diminished pulses in LE with complaints of intermittent claudication: Check ABI's.   Bettey Mare. Lyman Bishop NP [facility] 11/01/2012, 8:28 AM  Cardiology Attending Patient interviewed and examined. Discussed with Joni Reining, NP.  Above note annotated and modified based upon my findings.  Initiate oral anticoagulation after endoscopic procedures have been completed and plan elective cardioversion in 3 weeks. Repeat echocardiogram at that time.  [place] Bing, MD 11/01/2012, 9:18 AM

## 2012-11-01 NOTE — Progress Notes (Signed)
Pt tolerated  ambulation in hallway using rolling walker and accompained by RN, maintained room sats 94 % denies chest pain [name] [name]

## 2012-11-02 MED ORDER — CARVEDILOL 6.25 MG PO TABS
6.2500 mg | ORAL_TABLET | Freq: Two times a day (BID) | ORAL | Status: DC
  Administered 2012-11-04 – 2012-11-06 (×4): 6.25 mg via ORAL
  Filled 2012-11-02 (×10): qty 1

## 2012-11-02 NOTE — Clinical Social Work Note (Signed)
CSW reviewed chart and consulted with RN re: pt care and disposition.  Pt to d/c to [facility] upon d/c.  Per report pt not medically stable to d/c at this time.  CSW will continue to follow.  Vickii Penna, LCSWA [phone]  Clinical Social Work

## 2012-11-02 NOTE — Progress Notes (Addendum)
[name] [name]  79 y.o.  female  Subjective: Patient reports that she doesn't feel good today.  Experienced sharp momentary chest pains last night.  This morning she notes weakness, malaise and dizziness.   Allergy: Sulfa antibiotics  Objective: Vital signs in last 24 hours: Temp:  [97.5 F (36.4 C)-98 F (36.7 C)] 97.5 F (36.4 C) (05/26 0500) Pulse Rate:  [64-92] 72 (05/26 0750) Resp:  [18] 18 (05/26 0500) BP: (90-148)/(48-72) 91/48 mmHg (05/26 0750) SpO2:  [94 %-100 %] 100 % (05/26 0500) Weight:  [72.8 kg (160 lb 7.9 oz)] 72.8 kg (160 lb 7.9 oz) (05/26 0500)  72.8 kg (160 lb 7.9 oz) Body mass index is 25.92 kg/(m^2).  Weight change: 4.5 kg (9 lb 14.7 oz) Last BM Date: 10/30/12  Intake/Output from previous day: 05/25 0701 - 05/26 0700 In: 840 [P.O.:840] Out: 650 [Urine:650] Total I/O since admission: -10 L   General- Well developed; no acute distress; pallor Neck- No JVD Lungs- clear lung fields; normal I:E ratio Cardiovascular- normal PMI; normal S1 and S2 Abdomen- mildly distended; normal bowel sounds; soft and non-tender without masses or organomegaly Skin- Warm, no significant lesions Extremities- Nl distal pulses; no edema  Lab Results: BMET:  Recent Labs  10/31/12 0530  NA 137  K 4.0  CL 93*  CO2 32  GLUCOSE 123*  BUN 22  CREATININE 0.94  CALCIUM 9.6   Lipids:    Component Value Date/Time   CHOL 104 10/25/2012 0449   TRIG 53 10/25/2012 0449   HDL 66 10/25/2012 0449   CHOLHDL 1.6 10/25/2012 0449   VLDL 11 10/25/2012 0449   LDLCALC 27 10/25/2012 0449   Telemetry: Atrial fibrillation with controlled ventricular response; PVCs  Medications:  I have reviewed the patient's current medications. Scheduled: . aspirin  81 mg Oral Daily  . carvedilol  6.25 mg Oral BID WC  . fluticasone  2 spray Each Nare Daily  . furosemide  40 mg Oral Daily  . hydrocortisone ointment   Topical BID  . levofloxacin  750 mg Oral Q48H  . lisinopril  2.5 mg Oral BID  . loratadine   10 mg Oral Daily  . pantoprazole  40 mg Oral Q1200  . potassium chloride  20 mEq Oral Daily  . sodium chloride  3 mL Intravenous Q12H  . vitamin B-12  1,000 mcg Oral Daily  . vitamin C  500 mg Oral Daily    Assessment/Plan: Takotsubo's cardiomyopathy: CHF compensated. Patient not tolerating ACE inhibitor and beta blocker as the result of marginally low blood pressures approaching 90 mmHg. Since we expect recovery of her cardiomyopathic process, optimize medical therapy may not be crucial.  Dose of carvedilol will be reduced.  Atrial fibrillation: Arrhythmia persists; anticoagulation will be necessary following GI evaluation and treatment.  GI consultation: Cardiology issues are adequately managed. She is medically stable for any endoscopic procedure. If scheduling or availability of endoscopy lab presents a problem, we can discharge patient with plans for outpatient GI followup.    LOS: 9 days   Iota Bing 11/02/2012, 9:17 AM

## 2012-11-02 NOTE — Progress Notes (Signed)
11/02/2012 0830 Nursing note pt. bp low at 91/48 HR 72 Afib. Pt. Asymptomatic. PA Tereso Newcomer paged and made aware. Orders received and enacted. Will continue to monitor patient. Kelby Fam, Blanchard Kelch

## 2012-11-02 NOTE — Progress Notes (Signed)
Physical Therapy Treatment Patient Details Name: [name] [name] MRN: [id number] DOB: 03-Mar-1923 Today's Date: 11/02/2012 Time: 9147-8295 PT Time Calculation (min): 23 min  PT Assessment / Plan / Recommendation Comments on Treatment Session  Pt demonstrates improvements in activity tolerance and mobility today. Making steady progress towards PT goals. Pt did have 2 incidence of "starry vision" with ambulation and HRs in 110s. Will continue to work with patient to progress activity as tolerated.    Follow Up Recommendations  SNF     Does the patient have the potential to tolerate intense rehabilitation     Barriers to Discharge        Equipment Recommendations  Rolling walker with 5" wheels    Recommendations for Other Services    Frequency Min 2X/week   Plan Discharge plan remains appropriate    Precautions / Restrictions Restrictions Weight Bearing Restrictions: No   Pertinent Vitals/Pain No pain at this time, spO2 on rm air at rest 93% decreased to 89% during ambulation    Mobility  Bed Mobility Bed Mobility: Supine to Sit;Sitting - Scoot to Edge of Bed Supine to Sit: 5: Supervision Sitting - Scoot to Edge of Bed: 5: Supervision Details for Bed Mobility Assistance: pt able to perform with more ease today Transfers Transfers: Sit to Stand;Stand to Sit Sit to Stand: 4: Min guard;From bed;From toilet Stand to Sit: 4: Min guard;To bed;To toilet Details for Transfer Assistance: VCs for hand placement initially good carry over to other surfaces. Ambulation/Gait Ambulation/Gait Assistance: 4: Min guard Ambulation Distance (Feet): 160 Feet Assistive device: Rolling walker Ambulation/Gait Assistance Details: VCs for upright posture and position within rw. Patient had 2 incidence of "starry vision" during ambulation, resolved with increased rest breaks. 3 minutes, 1 minute.   Gait Pattern: Step-through pattern;Decreased stride length;Narrow base of support Gait velocity: decreased  (increase risk for falls) General Gait Details: Overall improved compared to prior session, O2 on rm air with ambulation decreased to 89% but rebounded with rest break and pursed lip breathing. Stairs: No    Exercises General Exercises - Lower Extremity Ankle Circles/Pumps: AROM;20 reps   PT Diagnosis:    PT Problem List:   PT Treatment Interventions:     PT Goals Acute Rehab PT Goals PT Goal Formulation: With patient Time For Goal Achievement: 11/11/12 Potential to Achieve Goals: Good Pt will go Sit to Stand: Independently PT Goal: Sit to Stand - Progress: Progressing toward goal Pt will Stand: Independently PT Goal: Stand - Progress: Progressing toward goal Pt will Ambulate: >150 feet;with modified independence;with rolling walker PT Goal: Ambulate - Progress: Progressing toward goal  Visit Information  Last PT Received On: 11/02/12 Assistance Needed: +1    Subjective Data  Subjective: I had a good night sleep last night  Patient Stated Goal: to get stronger again   Cognition  Cognition Arousal/Alertness: Awake/alert Behavior During Therapy: WFL for tasks assessed/performed Overall Cognitive Status: Within Functional Limits for tasks assessed    Balance  Balance Balance Assessed: Yes Static Standing Balance Static Standing - Balance Support: Bilateral upper extremity supported;During functional activity Static Standing - Level of Assistance: 5: Stand by assistance;4: Min assist Static Standing - Comment/# of Minutes: 3 minutes, 1 minute  End of Session PT - End of Session Equipment Utilized During Treatment: Gait belt;Oxygen (oxygen replaced at conclusion of session 2 L) Activity Tolerance: Patient limited by fatigue Patient left: in chair;with call bell/phone within reach Nurse Communication: Mobility status   GP     Fabio Asa 11/02/2012,  2:50 PM Charlotte Crumb, PT DPT  [phone]

## 2012-11-02 NOTE — Progress Notes (Addendum)
Patient ID: [name] [name], female   DOB: [birth year], 79 y.o.   MRN: [id number] [facility] Progress Note  [name] [name] 79 y.o. 1922-10-02   Subjective: Patient does not feel well. Feels full in chest. Sitting in chair on nasal cannula oxygen.  Objective: Vital signs in last 24 hours: Filed Vitals:   11/02/12 0958  BP: 94/55  Pulse: 66  Temp: 97.5  Resp: 18    Physical Exam: Gen: elderly, frail, alert, no acute distress  Lab Results:  Recent Labs  10/31/12 0530  NA 137  K 4.0  CL 93*  CO2 32  GLUCOSE 123*  BUN 22  CREATININE 0.94  CALCIUM 9.6   No results found for this basename: AST, ALT, ALKPHOS, BILITOT, PROT, ALBUMIN,  in the last 72 hours No results found for this basename: WBC, NEUTROABS, HGB, HCT, MCV, PLT,  in the last 72 hours No results found for this basename: LABPROT, INR,  in the last 72 hours    Assessment/Plan: 79yo with distal esophageal stricture in need of esophageal dilation. Patient recovering from pneumonia and has Afib with CHF. BP meds adjustment made by cardiology to help with hypotension. She needs an endoscopy with dilation prior to discharge and clinical status is currently tenuous for the procedure due to need for sedation and risk of dropping BP even further. Will change to clears since fullness she is having could be from food hanging up and being slow to pass. NPO after midnight and will tentatively plan for EGD with dilation tomorrow if her BP is better and respiratory status stable. Time TBD after discussion with Dr. Madilyn Fireman who will be on service tomorrow.   [name] [name] C. 11/02/2012, 11:42 AM

## 2012-11-03 ENCOUNTER — Encounter (HOSPITAL_COMMUNITY): Payer: Self-pay | Admitting: Cardiology

## 2012-11-03 DIAGNOSIS — I739 Peripheral vascular disease, unspecified: Secondary | ICD-10-CM

## 2012-11-03 LAB — BASIC METABOLIC PANEL
BUN: 19 mg/dL (ref 6–23)
CO2: 27 mEq/L (ref 19–32)
Calcium: 9.1 mg/dL (ref 8.4–10.5)
Creatinine, Ser: 1.04 mg/dL (ref 0.50–1.10)
GFR calc Af Amer: 54 mL/min — ABNORMAL LOW (ref >90)

## 2012-11-03 LAB — PRO B NATRIURETIC PEPTIDE: Pro B Natriuretic peptide (BNP): 2422 pg/mL — ABNORMAL HIGH (ref 0–450)

## 2012-11-03 MED ORDER — PANTOPRAZOLE SODIUM 40 MG PO TBEC
40.0000 mg | DELAYED_RELEASE_TABLET | Freq: Two times a day (BID) | ORAL | Status: DC
  Administered 2012-11-03 – 2012-11-06 (×6): 40 mg via ORAL
  Filled 2012-11-03 (×6): qty 1

## 2012-11-03 NOTE — Progress Notes (Signed)
PULMONARY  / CRITICAL CARE MEDICINE  Name: [name] [name] MRN: [id number] DOB: Jul 10, 1922    ADMISSION DATE:  10/24/2012 CONSULTATION DATE:  10/24/2012  REFERRING MD :  Excell Seltzer PRIMARY SERVICE: Cardiology  CHIEF COMPLAINT:  Acute respiratory failure  BRIEF PATIENT DESCRIPTION: 79 y/o female with diverticulosis, COPD, and breast cancer was admitted to [facility] on 5/17 after acute respiratory failure.  She presented to [facility] with sudden onset sx was intubated and brought to the [facility] cath lab where she was found to have tako-tsubo cardiomyopathy.   Hx:  pneumonia in dec & jan & severe GERD  SIGNIFICANT EVENTS / STUDIES:  5/17 - Admit with acute resp fx, tako-tsubo cardiomyopathy 5/17 - LHC > Takotsubo syndrome with severe LV systolic dysfunction, mild non-obstructive CAD 5/18 - continued shock 5/22 - stable on floor, continued small pleural effusions, Rx for CAP  LINES / TUBES: 5/17 ETT >>5/18 5/17 L IJ CVL >>5/19  CULTURES: BC 5/17>>> ng Sputum 5/17>>>ng Urine strep ag >>neg Urine leg ag >>neg  ANTIBIOTICS: Levo 5/18>>> ceftraixone 5/18>>> 5/21 vanc 5/28>>>off    SUBJECTIVE: Pt still not able to swallow, pt has cough noted.    VITAL SIGNS: Temp:  [97.2 F (36.2 C)-97.7 F (36.5 C)] 97.7 F (36.5 C) (05/27 0455) Pulse Rate:  [76-80] 76 (05/27 0455) Resp:  [18] 18 (05/27 0455) BP: (91-133)/(44-64) 133/64 mmHg (05/27 1022) SpO2:  [94 %-97 %] 97 % (05/27 0455) Weight:  [72.9 kg (160 lb 11.5 oz)] 72.9 kg (160 lb 11.5 oz) (05/27 0455)  INTAKE / OUTPUT: Intake/Output     05/26 0701 - 05/27 0700 05/27 0701 - 05/28 0700   P.O. 960 240   Total Intake(mL/kg) 960 (13.2) 240 (3.3)   Urine (mL/kg/hr) 1825 (1) 500 (1.4)   Total Output 1825 500   Net -865 -260          PHYSICAL EXAMINATION: Gen: chronically ill appearing, NAD walking  HEENT: NCAT, PERRL PULM: scattered rhonchi CV: RRR, no mgr, no JVD AB: BS infrequent, soft, nontender, no hsm Ext: warm, no edema Derm: no rash  or skin breakdown Neuro: non focal  LABS:  Recent Labs Lab 10/28/12 0530 10/29/12 0525 10/29/12 1620 10/31/12 0530  NA 134* 138 136 137  K 3.0* 3.7 3.9 4.0  CL 95* 95* 93* 93*  CO2 28 30 30  32  GLUCOSE 120* 163* 111* 123*  BUN 19 22 23 22   CREATININE 0.79 0.94 1.00 0.94  CALCIUM 8.9 9.0 9.4 9.6    Recent Labs Lab 10/28/12 0530  HGB 11.1*  HCT 32.6*  WBC 7.3  PLT 167    RADIOLOGY: No results found.  ASSESSMENT / PLAN:  Acute respiratory failure  - in setting of acute pulmonary edema r/t Takotsubo Cardiomyopathy COPD - on O2 at home prior to admit at night RUL infiltrate - treat as CAP, but favor aspiration / GERD.  Note h/o recurrent pneumonia & GERD ? aspiration Moderate R pleural effusion - new 5/21, s/p thora 5/22 Aspiration  - distal esophageal stricture,  ? Peptic  P: -10days total levaquin -GI following for esophageal strictures / GERD, considering dilation -IS / pulmonary hygiene -continue spiriva - PRN bronchodilator for d/c with hx of COPD  Caryl Bis  [phone]  Cell  [phone]  If no response or cell goes to voicemail, call beeper [phone]  11/03/2012, 12:00 PM

## 2012-11-03 NOTE — Progress Notes (Signed)
Subjective: Patient denies CP  Breathing is OK  NOt dizzy. Objective: Filed Vitals:   11/02/12 1450 11/02/12 1645 11/02/12 2015 11/03/12 0455  BP: 91/44 93/54 120/55 105/47  Pulse: 77 80 77 76  Temp: 97.3 F (36.3 C)  97.2 F (36.2 C) 97.7 F (36.5 C)  TempSrc: Oral  Oral Oral  Resp: 18  18 18   Height:      Weight:    160 lb 11.5 oz (72.9 kg)  SpO2: 94%  96% 97%   Weight change: 3.5 oz (0.1 kg)  Intake/Output Summary (Last 24 hours) at 11/03/12 0745 Last data filed at 11/03/12 0300  Gross per 24 hour  Intake    600 ml  Output   1825 ml  Net  -1225 ml    General: Alert, awake, oriented x3, in no acute distress Neck:  JVP is normal Heart: Regular rate and rhythm, without murmurs, rubs, gallops.  Lungs: Clear to auscultation.  No rales or wheezes. Exemities:  No edema.   Neuro: Grossly intact, nonfocal.  Tele  Afib  80s Lab Results: No results found for this or any previous visit (from the past 24 hour(s)).  Studies/Results: @RISRSLT24 @  Medications:  Reviewed   @PROBHOSP @  1.  Takosubo's cardiomyopathy  Patient's volume status looks OK  BP is a little better overall  Check BMET>  2.  GI  Plan for possible EGD with dilitation today.    3.  Atrial fibrillation.  Rates controlled.  Not on anticoagulation until after EGD>  LOS: 10 days   Dietrich Pates 11/03/2012, 7:45 AM

## 2012-11-03 NOTE — Progress Notes (Signed)
[facility] Progress Note  Subjective:patient states that she's a little better. Her dysphagia is intermittent and mainly for solids he has had a lot of heartburn. Her breathing andObjective: Vital signs in last 24 hours: Temp:  [97.2 F (36.2 C)-97.7 F (36.5 C)] 97.7 F (36.5 C) (05/27 0455) Pulse Rate:  [66-80] 76 (05/27 0455) Resp:  [18] 18 (05/27 0455) BP: (91-120)/(44-55) 94/49 mmHg (05/27 0752) SpO2:  [94 %-97 %] 97 % (05/27 0455) Weight:  [72.9 kg (160 lb 11.5 oz)] 72.9 kg (160 lb 11.5 oz) (05/27 0455) Weight change: 0.1 kg (3.5 oz)   WN:[id number]  Lab Results: No results found for this or any previous visit (from the past 24 hour(s)).  Studies/Results: No results found.    Assessment: Esophageal stricture will need dilatation at some point.  Plan: Since he is still recovering from series pneumonia clears and a probably had an endoscopy one or 2 days prior to discharge. Will hold n.p.o. After midnight again tomorrow.    Thanh Mottern C 11/03/2012, 8:48 AM

## 2012-11-03 NOTE — Progress Notes (Signed)
VASCULAR LAB PRELIMINARY  ARTERIAL  ABI completed:    RIGHT    LEFT    PRESSURE WAVEFORM  PRESSURE WAVEFORM  BRACHIAL 118 Triphasic BRACHIAL Restricted extremity Triphasic  DP 103 Biphasic DP 87 Monophasic         PT 78 Biphasic PT  Barely audible     PER 85 Dampened monophasic           RIGHT LEFT  ABI 0.87 0.74   Right ABI indicates a mild reduction in arterial flow. Left - ABI indicates a moderate reduction in arterial flow  Farrell Broerman, RVS 11/03/2012, 11:14 AM

## 2012-11-04 MED ORDER — SODIUM CHLORIDE 0.9 % IV SOLN
INTRAVENOUS | Status: DC
  Administered 2012-11-04: 14:00:00 via INTRAVENOUS

## 2012-11-04 NOTE — Progress Notes (Signed)
Patient: [name] [name] / Admit Date: 10/24/2012 / Date of Encounter: 11/04/2012, 8:57 AM   Subjective  Feeling stronger than yesterday. No CP or SOB. Still has occasional leg pain - reports claudication-like sx.   Objective   Telemetry: afib rates 60-80s, occ PVC Physical Exam: Filed Vitals:   11/04/12 0800  BP: 116/47  Pulse: 77  Temp: 97.7  Resp: 18   General: Well developed, well nourished thin WF in no acute distress. Head: Normocephalic, atraumatic, sclera non-icteric, no xanthomas, nares are without discharge. Neck: . JVD not elevated. Lungs: Dimished BS at bases, rhonchorous BS at bases otherwise without wheezes or rales. Fairly good air movement. Breathing is unlabored. Heart: Irregular, rate controlled S1 S2 without murmurs, rubs, or gallops.  Abdomen: Soft, non-tender, non-distended with normoactive bowel sounds. No hepatomegaly. No rebound/guarding. No obvious abdominal masses. Msk:  Strength and tone appear normal for age. Extremities: No clubbing or cyanosis. No edema but hyperpigmentation changes/skin darkening.  Distal pedal pulses are in tact but weaker bilaterally. Neuro: Alert and oriented X 3. Moves all extremities spontaneously. Psych:  Responds to questions appropriately with a normal affect.    Intake/Output Summary (Last 24 hours) at 11/04/12 0857 Last data filed at 11/04/12 0701  Gross per 24 hour  Intake    720 ml  Output   1275 ml  Net   -555 ml    Inpatient Medications:  . aspirin  81 mg Oral Daily  . carvedilol  6.25 mg Oral BID WC  . fluticasone  2 spray Each Nare Daily  . furosemide  40 mg Oral Daily  . hydrocortisone ointment   Topical BID  . loratadine  10 mg Oral Daily  . pantoprazole  40 mg Oral BID  . potassium chloride  20 mEq Oral Daily  . sodium chloride  3 mL Intravenous Q12H  . vitamin B-12  1,000 mcg Oral Daily  . vitamin C  500 mg Oral Daily    Labs:  Recent Labs  11/03/12 1330  NA 135  K 4.7  CL 95*  CO2 27  GLUCOSE  175*  BUN 19  CREATININE 1.04  CALCIUM 9.1   Radiology/Studies:  Dg Chest 1 View 10/28/2012   *RADIOLOGY REPORT*  Clinical Data: Status post right-sided thoracentesis  CHEST - 1 VIEW  Comparison: 10/28/2012  Findings:  There has been interval decrease in volume of the right pleural effusion.  No pneumothorax after thoracentesis is noted.  The right upper lobe and left lower lobe infiltrates are again noted and appear unchanged from previous exam.  No new findings identified.  IMPRESSION:  1.  No pneumothorax identified after right-sided thoracentesis   Original Report Authenticated By: Signa Kell, M.D.   Dg Chest 2 View 10/30/2012   *RADIOLOGY REPORT*  Clinical Data: Weakness and shortness of breath.  CHEST - 2 VIEW  Comparison: 10/29/2012.  Findings: The cardiac silhouette, mediastinal and hilar contours are stable.  Stable left basilar density, right upper lobe infiltrate and small effusions.  IMPRESSION: Stable left basilar density, right upper lobe infiltrate and small effusions.   Original Report Authenticated By: Rudie Meyer, M.D.   Dg Chest 2 View  10/29/2012   *RADIOLOGY REPORT*  Clinical Data: Follow up pneumonia and pleural effusion.  CHEST - 2 VIEW  Comparison: 10/28/2012 and 10/26/2012.  Findings: The heart size and mediastinal contours are stable. There is aortic atherosclerosis.  The right upper lobe infiltrate and left lower lobe nodular densities are unchanged.  There are small bilateral pleural  effusions.  No pneumothorax is identified. Surgical clips are present within the left axilla.  IMPRESSION: No significant change in the right upper lobe infiltrate and small pleural effusions.  Continued follow-up recommended.   Original Report Authenticated By: Carey Bullocks, M.D.   Dg Esophagus 10/26/2012   *RADIOLOGY REPORT*  Clinical Data: Dysphagia with regurgitation.  History of gastroesophageal reflux disease.  ESOPHOGRAM / BARIUM SWALLOW / BARIUM TABLET STUDY  Technique:  Combined  double contrast and single contrast examination performed using effervescent crystals, thick barium liquid, and thin barium liquid.  The patient was observed with fluoroscopy swallowing a 13mm barium sulphate tablet.  Fluoroscopy time:  1 minute 9 seconds.  Comparison:  Portable chest 10/26/2012.  Findings:  Examination is mildly limited by the patient's limited mobility.  Silent laryngeal penetration was noted.  There was no gross aspiration into the tracheobronchial tree.  There is mild presbyesophagus.  There is a small hiatal hernia associated with a small distal esophageal stricture.  No mucosal ulceration is identified.  However, this distal stricture prevented the passage of a 13 mm barium tablet (observed intermittently over approximately 3 mm of fluoroscopy).  IMPRESSION:  1.  Silent laryngeal penetration.  No gross aspiration identified. Consider dedicated speech therapy evaluation. 2.  Mild presbyesophagus. 3.  Distal esophageal stricture prevents the passage of a 13 mm barium tablet. This is associated with a small hiatal hernia and is likely a peptic stricture.   Original Report Authenticated By: Carey Bullocks, M.D.     Assessment and Plan  1. Acute hypoxic respiratory failure from pulmonary edema/PNA requiring intubation this admission - improved.  2. Acute cardiogenic shock/acute systolic CHF secondary to Takotsubo syncope with EF 20%, improved to 40% by f/u echo - volume improved, - 11L this admission. ?decrease Lasix. BP still tenuous, thus will hold ACEI for now - suspect it is important to keep BB on board for HR control. Can also consider decreasing BB dose due to low BP.  3. Ant-lat STEMI (secondary to Takotsubo) with mild CAD by cath 10/24/12 - continue ASA, BB for now. Defer decision re: statin to MD.  4. CAP with concern for aspiration contributing, secondary to #5 - abx per pulm. Per radiology read on CXR, will need f/u imaging to ensure clearance of infiltrates. 5. Esophageal  stricture - appreciate GI assistance who are planning dilitation. 6. R pleural effusion s/p thoracentesis 10/28/12 - culture neg. 7. Atrial fibrillation, newly recognized - rates controlled. Anticoag deferred pending esophagal dilitation. 8. Abnormal ABIs - R 0.87, L 0.74 - will ask MD to review for further recs. 9. COPD - on home O2 prior to admission at night. 10. Hyperglycemia - ?DM. See if possible to add on A1C. 11. Deconditioning - dispo will be SNF.  Signed, Ronie Spies PA-C  Attending Note:   The patient was seen and examined.  Agree with assessment and plan as noted above.  Changes made to the above note as needed.  Ms. Vanlue has made significant progress since her admission.  She is feeling better.  I think she should be able to have her esophageal dilitation soon - ? Today.   Vesta Mixer, Montez Hageman., MD, [facility] 11/04/2012, 9:39 AM

## 2012-11-04 NOTE — Progress Notes (Signed)
Pt ambulated 1000 ft on rm air with the assistance of the walker, pt tolerated activity well and is now resting in chair. [name] [name] M

## 2012-11-04 NOTE — Progress Notes (Signed)
PULMONARY  / CRITICAL CARE MEDICINE  Name: [name] [name] MRN: [id number] DOB: Jan 30, 1923    ADMISSION DATE:  10/24/2012 CONSULTATION DATE:  10/24/2012  REFERRING MD :  Excell Seltzer PRIMARY SERVICE: Cardiology  CHIEF COMPLAINT:  Acute respiratory failure  BRIEF PATIENT DESCRIPTION: 79 y/o female with diverticulosis, COPD, and breast cancer was admitted to [facility] on 5/17 after acute respiratory failure.  She presented to [facility] with sudden onset sx was intubated and brought to the [facility] cath lab where she was found to have tako-tsubo cardiomyopathy.   Hx:  pneumonia in dec & jan & severe GERD  SIGNIFICANT EVENTS / STUDIES:  5/17 - Admit with acute resp fx, tako-tsubo cardiomyopathy 5/17 - LHC > Takotsubo syndrome with severe LV systolic dysfunction, mild non-obstructive CAD 5/18 - continued shock 5/22 - stable on floor, continued small pleural effusions, Rx for CAP  LINES / TUBES: 5/17 ETT >>5/18 5/17 L IJ CVL >>5/19  CULTURES: BC 5/17>>> ng Sputum 5/17>>>ng Urine strep ag >>neg Urine leg ag >>neg  ANTIBIOTICS: Levo 5/18>>>5/28 ceftraixone 5/18>>> 5/21 vanc 5/28>>>off    SUBJECTIVE: Waiting on EGD   VITAL SIGNS: Temp:  [97.4 F (36.3 C)-97.7 F (36.5 C)] 97.7 F (36.5 C) (05/28 0656) Pulse Rate:  [71-79] 77 (05/28 0800) Resp:  [18] 18 (05/28 0656) BP: (86-116)/(45-69) 116/47 mmHg (05/28 0800) SpO2:  [96 %-97 %] 97 % (05/28 0656) Weight:  [72.1 kg (158 lb 15.2 oz)] 72.1 kg (158 lb 15.2 oz) (05/28 0656)  INTAKE / OUTPUT: Intake/Output     05/27 0701 - 05/28 0700 05/28 0701 - 05/29 0700   P.O. 720    Total Intake(mL/kg) 720 (10)    Urine (mL/kg/hr) 1000 (0.6) 275 (1)   Total Output 1000 275   Net -280 -275          PHYSICAL EXAMINATION: Gen: chronically ill appearing, NAD walking  HEENT: NCAT, PERRL PULM: scattered rhonchi CV: RRR, no mgr, no JVD AB: BS infrequent, soft, nontender, no hsm Ext: warm, no edema Derm: no rash or skin breakdown Neuro: non  focal  LABS:  Recent Labs Lab 10/29/12 0525 10/29/12 1620 10/31/12 0530 11/03/12 1330  NA 138 136 137 135  K 3.7 3.9 4.0 4.7  CL 95* 93* 93* 95*  CO2 30 30 32 27  GLUCOSE 163* 111* 123* 175*  BUN 22 23 22 19   CREATININE 0.94 1.00 0.94 1.04  CALCIUM 9.0 9.4 9.6 9.1   No results found for this basename: HGB, HCT, WBC, PLT,  in the last 168 hours  RADIOLOGY: No results found.  ASSESSMENT / PLAN:  Acute respiratory failure  - in setting of acute pulmonary edema r/t Takotsubo Cardiomyopathy COPD - on O2 at home prior to admit at night RUL infiltrate - treat as CAP, but favor aspiration / GERD.  Note h/o recurrent pneumonia & GERD ? aspiration Moderate R pleural effusion - new 5/21, s/p thora 5/22 Aspiration  - distal esophageal stricture,  ? Peptic  P: -10days total levaquin>>as of 5/28 so d/c further dosing -I hope EGD can happen today -cont IS / pulmonary hygiene -continue spiriva - cont PRN bronchodilator for d/c with hx of COPD  Caryl Bis  [phone]  Cell  [phone]  If no response or cell goes to voicemail, call beeper [phone]  11/04/2012, 10:55 AM

## 2012-11-04 NOTE — Progress Notes (Signed)
Pt ambulated 200 ft w RW x 1 assist, slow gait, unsteady at times.  Returned to chair, call bell within reach.  Will continue to monitor.

## 2012-11-04 NOTE — Progress Notes (Signed)
Physical Therapy Treatment Patient Details Name: [name] [name] MRN: [id number] DOB: [birth year] Today's Date: 11/04/2012 Time: 7829-5621 PT Time Calculation (min): 18 min  PT Assessment / Plan / Recommendation Comments on Treatment Session  Pt demonstrates steady progress towards PT goals. Patient with some fatigue during ambulation, but tolerated extended session of ther-ex well. Will continue to see and progress activity as tolerated. Encouraged patient to ambulate again with nsg if able.    Follow Up Recommendations  SNF           Equipment Recommendations  Rolling walker with 5" wheels       Frequency Min 2X/week   Plan Discharge plan remains appropriate    Precautions / Restrictions Restrictions Weight Bearing Restrictions: No   Pertinent Vitals/Pain No pain at this time    Mobility  Bed Mobility Bed Mobility: Not assessed Details for Bed Mobility Assistance: pt received in chair Transfers Transfers: Sit to Stand;Stand to Sit Sit to Stand: 4: Min guard;From chair/3-in-1;From toilet Stand to Sit: 4: Min guard;To chair/3-in-1;To toilet Details for Transfer Assistance: Good placement of hands for transfer Ambulation/Gait Ambulation/Gait Assistance: 5: Supervision;4: Min guard Ambulation Distance (Feet): 30 Feet Assistive device: None Ambulation/Gait Assistance Details: No assist needed today Gait Pattern: Step-through pattern;Decreased stride length;Narrow base of support Gait velocity: decreased (increase risk for falls) General Gait Details: better stability today Stairs: No    Exercises General Exercises - Lower Extremity Ankle Circles/Pumps: AROM;20 reps Long Arc Quad: AROM;Both;10 reps Hip ABduction/ADduction: AROM;Both;10 reps Hip Flexion/Marching: AROM;Both;10 reps Mini-Sqauts: AROM;Both;10 reps;Standing (VCs for posture)     PT Goals Acute Rehab PT Goals PT Goal Formulation: With patient Time For Goal Achievement: 11/11/12 Potential to Achieve Goals:  Good Pt will go Sit to Stand: Independently PT Goal: Sit to Stand - Progress: Progressing toward goal Pt will Stand: Independently PT Goal: Stand - Progress: Progressing toward goal Pt will Ambulate: >150 feet;with modified independence;with rolling walker PT Goal: Ambulate - Progress: Progressing toward goal  Visit Information  Last PT Received On: 11/04/12 Assistance Needed: +1    Subjective Data  Subjective: I had a good night sleep last night  Patient Stated Goal: to get stronger again   Cognition  Cognition Arousal/Alertness: Awake/alert Behavior During Therapy: WFL for tasks assessed/performed Overall Cognitive Status: Within Functional Limits for tasks assessed    Balance  Balance Balance Assessed: Yes Static Standing Balance Static Standing - Balance Support: During functional activity Static Standing - Level of Assistance: 5: Stand by assistance  End of Session PT - End of Session Equipment Utilized During Treatment: Gait belt;Oxygen (oxygen replaced at conclusion of session 2 L) Activity Tolerance: Patient tolerated treatment well Patient left: in chair;with call bell/phone within reach Nurse Communication: Mobility status   GP     Fabio Asa 11/04/2012, 1:01 PM Charlotte Crumb, PT DPT  [phone]

## 2012-11-05 ENCOUNTER — Encounter (HOSPITAL_COMMUNITY): Payer: Self-pay | Admitting: *Deleted

## 2012-11-05 ENCOUNTER — Encounter (HOSPITAL_COMMUNITY)
Admission: EM | Disposition: A | Discharge status: Skilled Nursing Facility | Payer: Self-pay | Attending: Cardiovascular Disease

## 2012-11-05 HISTORY — PX: ESOPHAGOGASTRODUODENOSCOPY: SHX5428

## 2012-11-05 LAB — BASIC METABOLIC PANEL
BUN: 16 mg/dL (ref 6–23)
CO2: 27 mEq/L (ref 19–32)
GFR calc non Af Amer: 45 mL/min — ABNORMAL LOW (ref >90)
Glucose, Bld: 116 mg/dL — ABNORMAL HIGH (ref 70–99)
Potassium: 4.5 mEq/L (ref 3.5–5.1)
Sodium: 134 mEq/L — ABNORMAL LOW (ref 135–145)

## 2012-11-05 SURGERY — EGD (ESOPHAGOGASTRODUODENOSCOPY)
Anesthesia: Moderate Sedation

## 2012-11-05 MED ORDER — BUTAMBEN-TETRACAINE-BENZOCAINE 2-2-14 % EX AERO
INHALATION_SPRAY | CUTANEOUS | Status: DC | PRN
  Administered 2012-11-05: 1 via TOPICAL

## 2012-11-05 MED ORDER — FENTANYL CITRATE 0.05 MG/ML IJ SOLN
INTRAMUSCULAR | Status: AC
  Filled 2012-11-05: qty 2

## 2012-11-05 MED ORDER — MIDAZOLAM HCL 10 MG/2ML IJ SOLN
INTRAMUSCULAR | Status: DC | PRN
  Administered 2012-11-05: 1 mg via INTRAVENOUS
  Administered 2012-11-05: 2 mg via INTRAVENOUS
  Administered 2012-11-05: 1 mg via INTRAVENOUS

## 2012-11-05 MED ORDER — DIPHENHYDRAMINE HCL 50 MG/ML IJ SOLN
INTRAMUSCULAR | Status: AC
  Filled 2012-11-05: qty 1

## 2012-11-05 MED ORDER — MIDAZOLAM HCL 5 MG/ML IJ SOLN
INTRAMUSCULAR | Status: AC
  Filled 2012-11-05: qty 2

## 2012-11-05 MED ORDER — FENTANYL CITRATE 0.05 MG/ML IJ SOLN
INTRAMUSCULAR | Status: DC | PRN
  Administered 2012-11-05: 25 ug via INTRAVENOUS

## 2012-11-05 NOTE — Op Note (Signed)
[facility] [address] [place], [address]   ENDOSCOPY PROCEDURE REPORT  PATIENT: [name], [name]  MR#: [id number] BIRTHDATE: [birth year] , 89  yrs. old GENDER: Female ENDOSCOPIST:Lashina Milles Madilyn Fireman, MD REFERRED BY: PROCEDURE DATE:  11/05/2012 PROCEDURE: ASA CLASS: INDICATIONS:  dysphagia with esophageal stricture on barium swallow MEDICATION:   fentanyl 25 mcg Versed 2 mg TOPICAL ANESTHETIC:   Cetacaine spray  DESCRIPTION OF PROCEDURE:   esophagus smooth distal stricture 2 cm above the squamocolumnar align, slight resistance to passage of this scope through it. Savary dilators of 12, 12.8 and 14 mm diameter were passed with minimal resistance and no blood seen on withdrawal. The stomach and duodenum were normal.     COMPLICATIONS: None  ENDOSCOPIC IMPRESSION:benign esophageal stricture status post dilatation.  RECOMMENDATIONS:advance diet. Okay to anticoagulate. We'll sign off for now.    _______________________________ eSigned:  Dorena Cookey, MD 11/05/2012 10:11 AM

## 2012-11-05 NOTE — Progress Notes (Signed)
Patient: [name] [name] / Admit Date: 10/24/2012 / Date of Encounter: 11/05/2012, 8:14 AM   Subjective  Feeling stronger than yesterday. No CP or SOB. Still has occasional leg pain  She was able to ambulate 1000 feet on room air yesterday.       Objective   Telemetry: afib rates 60-80s, occ PVC Physical Exam: Filed Vitals:   11/04/12 0800  BP: 116/47  Pulse: 77  Temp: 97.7  Resp: 18   General: Well developed, well nourished thin WF in no acute distress. Head: Normocephalic, atraumatic, sclera non-icteric, no xanthomas, nares are without discharge. Neck: . JVD not elevated. Lungs: Dimished BS at bases, rhonchorous BS at bases otherwise without wheezes or rales. Fairly good air movement. Breathing is unlabored. Heart: Irregular, rate controlled S1 S2 without murmurs, rubs, or gallops.  Abdomen: Soft, non-tender, non-distended with normoactive bowel sounds. No hepatomegaly. No rebound/guarding. No obvious abdominal masses. Msk:  Strength and tone appear normal for age. Extremities: No clubbing or cyanosis. No edema but hyperpigmentation changes/skin darkening.  Distal pedal pulses are in tact but weaker bilaterally. Neuro: Alert and oriented X 3. Moves all extremities spontaneously. Psych:  Responds to questions appropriately with a normal affect.    Intake/Output Summary (Last 24 hours) at 11/05/12 0814 Last data filed at 11/05/12 0700  Gross per 24 hour  Intake    960 ml  Output   2300 ml  Net  -1340 ml    Inpatient Medications:  . aspirin  81 mg Oral Daily  . carvedilol  6.25 mg Oral BID WC  . fluticasone  2 spray Each Nare Daily  . furosemide  40 mg Oral Daily  . hydrocortisone ointment   Topical BID  . loratadine  10 mg Oral Daily  . pantoprazole  40 mg Oral BID  . potassium chloride  20 mEq Oral Daily  . sodium chloride  3 mL Intravenous Q12H  . vitamin B-12  1,000 mcg Oral Daily  . vitamin C  500 mg Oral Daily    Labs:  Recent Labs  11/03/12 1330  11/05/12 0515  NA 135 134*  K 4.7 4.5  CL 95* 96  CO2 27 27  GLUCOSE 175* 116*  BUN 19 16  CREATININE 1.04 1.07  CALCIUM 9.1 9.0   Radiology/Studies:  Dg Chest 1 View 10/28/2012   *RADIOLOGY REPORT*  Clinical Data: Status post right-sided thoracentesis  CHEST - 1 VIEW  Comparison: 10/28/2012  Findings:  There has been interval decrease in volume of the right pleural effusion.  No pneumothorax after thoracentesis is noted.  The right upper lobe and left lower lobe infiltrates are again noted and appear unchanged from previous exam.  No new findings identified.  IMPRESSION:  1.  No pneumothorax identified after right-sided thoracentesis   Original Report Authenticated By: Signa Kell, M.D.   Dg Chest 2 View 10/30/2012   *RADIOLOGY REPORT*  Clinical Data: Weakness and shortness of breath.  CHEST - 2 VIEW  Comparison: 10/29/2012.  Findings: The cardiac silhouette, mediastinal and hilar contours are stable.  Stable left basilar density, right upper lobe infiltrate and small effusions.  IMPRESSION: Stable left basilar density, right upper lobe infiltrate and small effusions.   Original Report Authenticated By: Rudie Meyer, M.D.      Assessment and Plan  1. Acute hypoxic respiratory failure from pulmonary edema/PNA requiring intubation this admission - improved.  2. Acute cardiogenic shock/acute systolic CHF secondary to Takotsubo syncope with EF 20%, improved to 40% by f/u echo -  volume improved,  3. Ant-lat STEMI (secondary to Takotsubo) with mild CAD by cath 10/24/12 - continue ASA, BB for now. Defer decision re: statin to MD.  4. CAP with concern for aspiration contributing, secondary to #5 - abx per pulm. Per radiology read on CXR, will need f/u imaging to ensure clearance of infiltrates. 5. Esophageal stricture - appreciate GI assistance who are planning dilitation. 6. R pleural effusion s/p thoracentesis 10/28/12 - culture neg. 7. Atrial fibrillation, newly recognized - rates controlled. Will  start Xarelto after esophageal dilitation.  8. Abnormal ABIs - R 0.87, L 0.74 -  Will follow up in [facility] clinic with Dr. Excell Seltzer.   9. COPD - on home O2 prior to admission at night. 10. Hyperglycemia -  11. Deconditioning - dispo will be SNF.  Vesta Mixer, Montez Hageman., MD, [facility] 11/05/2012, 8:17 AM Office - [phone] Pager [phone]

## 2012-11-06 ENCOUNTER — Encounter (HOSPITAL_COMMUNITY): Payer: Self-pay | Admitting: Physician Assistant

## 2012-11-06 ENCOUNTER — Telehealth: Payer: Self-pay

## 2012-11-06 MED ORDER — CARVEDILOL 6.25 MG PO TABS: 6.2500 mg | ORAL_TABLET | Freq: Two times a day (BID) | ORAL | Status: DC

## 2012-11-06 MED ORDER — LEVALBUTEROL TARTRATE 45 MCG/ACT IN AERO
2.0000 | INHALATION_SPRAY | Freq: Four times a day (QID) | RESPIRATORY_TRACT | Status: DC | PRN
  Filled 2012-11-06: qty 15

## 2012-11-06 MED ORDER — OMEPRAZOLE 20 MG PO CPDR: 20.0000 mg | DELAYED_RELEASE_CAPSULE | Freq: Two times a day (BID) | ORAL | Status: DC

## 2012-11-06 MED ORDER — TIOTROPIUM BROMIDE MONOHYDRATE 18 MCG IN CAPS
18.0000 ug | ORAL_CAPSULE | Freq: Every day | RESPIRATORY_TRACT | Status: DC
  Filled 2012-11-06: qty 5

## 2012-11-06 MED ORDER — ASPIRIN 81 MG PO TBEC: 81.0000 mg | DELAYED_RELEASE_TABLET | Freq: Every day | ORAL | Status: DC

## 2012-11-06 MED ORDER — FUROSEMIDE 40 MG PO TABS: 40.0000 mg | ORAL_TABLET | Freq: Every day | ORAL | Status: DC

## 2012-11-06 MED ORDER — RIVAROXABAN 15 MG PO TABS: 15.0000 mg | ORAL_TABLET | Freq: Every day | ORAL | Status: DC

## 2012-11-06 MED ORDER — LEVALBUTEROL TARTRATE 45 MCG/ACT IN AERO: 2.0000 | INHALATION_SPRAY | Freq: Four times a day (QID) | RESPIRATORY_TRACT | Status: DC | PRN

## 2012-11-06 NOTE — Progress Notes (Signed)
Patient: [name] [name] / Admit Date: 10/24/2012 / Date of Encounter: 11/06/2012, 7:13 AM   Subjective  Feeling stronger than yesterday. No CP or SOB. Still has occasional leg pain  She was able to ambulate 1000 feet on room air   Had esophageal dilitation yesterday without complications.      Objective   Telemetry: afib rates 60-80s, occ PVC Physical Exam: Filed Vitals:   11/04/12 0800  BP: 116/47  Pulse: 77  Temp: 97.7  Resp: 18   General: Well developed, well nourished thin WF in no acute distress. Head: Normocephalic, atraumatic, sclera non-icteric, no xanthomas, nares are without discharge. Neck: . JVD not elevated. Lungs: Dimished BS at bases, rhonchorous BS at bases otherwise without wheezes or rales. Fairly good air movement. Breathing is unlabored. Heart: Irregular, rate controlled S1 S2 without murmurs, rubs, or gallops.  Abdomen: Soft, non-tender, non-distended with normoactive bowel sounds. No hepatomegaly. No rebound/guarding. No obvious abdominal masses. Msk:  Strength and tone appear normal for age. Extremities: No clubbing or cyanosis. No edema but hyperpigmentation changes/skin darkening.  Distal pedal pulses are in tact but weaker bilaterally. Neuro: Alert and oriented X 3. Moves all extremities spontaneously. Psych:  Responds to questions appropriately with a normal affect.    Intake/Output Summary (Last 24 hours) at 11/06/12 0713 Last data filed at 11/06/12 0557  Gross per 24 hour  Intake    240 ml  Output   3100 ml  Net  -2860 ml    Inpatient Medications:  . aspirin  81 mg Oral Daily  . carvedilol  6.25 mg Oral BID WC  . fluticasone  2 spray Each Nare Daily  . furosemide  40 mg Oral Daily  . hydrocortisone ointment   Topical BID  . loratadine  10 mg Oral Daily  . pantoprazole  40 mg Oral BID  . potassium chloride  20 mEq Oral Daily  . sodium chloride  3 mL Intravenous Q12H  . vitamin B-12  1,000 mcg Oral Daily  . vitamin C  500 mg Oral Daily     Labs:  Recent Labs  11/03/12 1330 11/05/12 0515  NA 135 134*  K 4.7 4.5  CL 95* 96  CO2 27 27  GLUCOSE 175* 116*  BUN 19 16  CREATININE 1.04 1.07  CALCIUM 9.1 9.0   Radiology/Studies:  Dg Chest 1 View 10/28/2012   *RADIOLOGY REPORT*  Clinical Data: Status post right-sided thoracentesis  CHEST - 1 VIEW  Comparison: 10/28/2012  Findings:  There has been interval decrease in volume of the right pleural effusion.  No pneumothorax after thoracentesis is noted.  The right upper lobe and left lower lobe infiltrates are again noted and appear unchanged from previous exam.  No new findings identified.  IMPRESSION:  1.  No pneumothorax identified after right-sided thoracentesis   Original Report Authenticated By: Signa Kell, M.D.   Dg Chest 2 View 10/30/2012   *RADIOLOGY REPORT*  Clinical Data: Weakness and shortness of breath.  CHEST - 2 VIEW  Comparison: 10/29/2012.  Findings: The cardiac silhouette, mediastinal and hilar contours are stable.  Stable left basilar density, right upper lobe infiltrate and small effusions.  IMPRESSION: Stable left basilar density, right upper lobe infiltrate and small effusions.   Original Report Authenticated By: Rudie Meyer, M.D.   1. Takotsubo syndrome:  Complicated by respiratory failure, cardiogenic shock,atrial fib.  She has made great progress.  She is ambulating without complications.     2. Esophageal stricture - appreciate GI assistance - s/p  dilitation. 6. R pleural effusion s/p thoracentesis 10/28/12 - culture neg. 7. Atrial fibrillation, newly recognized - rates controlled. Will start Xarelto 20 daily 8. Abnormal ABIs - R 0.87, L 0.74 -  Will follow up in [facility] clinic with Dr. Excell Seltzer.   9. COPD - on home O2 prior to admission at night.  Continue per medical doctor's recs.  10. Hyperglycemia -  11. Deconditioning -  SNF today    Alvia Grove., MD, [facility] 11/06/2012, 7:13 AM Office - [phone] Pager [phone]

## 2012-11-06 NOTE — Progress Notes (Signed)
Report called to [facility], pt dc per ambulance, O2 2 liters and belongings Georgette Dover

## 2012-11-06 NOTE — Telephone Encounter (Signed)
tcm

## 2012-11-06 NOTE — Telephone Encounter (Signed)
Message copied by Marcelle Overlie on Fri Nov 06, 2012 12:18 PM ------      Message from: Coralee Rud      Created: Fri Nov 06, 2012 12:05 PM      Regarding: tcm/ph transition of care       Dr Kirke Corin 11/12/12 10:45 ------

## 2012-11-06 NOTE — Progress Notes (Signed)
PULMONARY  / CRITICAL CARE MEDICINE  Name: [name] [name] MRN: [id number] DOB: May 20, 1923    ADMISSION DATE:  10/24/2012 CONSULTATION DATE:  10/24/2012  REFERRING MD :  Excell Seltzer PRIMARY SERVICE: Cardiology  CHIEF COMPLAINT:  Acute respiratory failure  BRIEF PATIENT DESCRIPTION: 79 y/o female with diverticulosis, COPD, and breast cancer was admitted to [facility] on 5/17 after acute respiratory failure.  She presented to [facility] with sudden onset sx was intubated and brought to the [facility] cath lab where she was found to have tako-tsubo cardiomyopathy.   Hx:  pneumonia in dec & jan & severe GERD  SIGNIFICANT EVENTS / STUDIES:  5/17 - Admit with acute resp fx, tako-tsubo cardiomyopathy 5/17 - LHC > Takotsubo syndrome with severe LV systolic dysfunction, mild non-obstructive CAD 5/18 - continued shock 5/22 - stable on floor, continued small pleural effusions, Rx for CAP  LINES / TUBES: 5/17 ETT >>5/18 5/17 L IJ CVL >>5/19  CULTURES: BC 5/17>>> ng Sputum 5/17>>>ng Urine strep ag >>neg Urine leg ag >>neg  ANTIBIOTICS: Levo 5/18>>>5/28 ceftraixone 5/18>>> 5/21 vanc 5/28>>>off    SUBJECTIVE: Much better with EGD, swallowing better    VITAL SIGNS: Temp:  [97.2 F (36.2 C)-97.8 F (36.6 C)] 97.5 F (36.4 C) (05/30 0814) Pulse Rate:  [75-88] 84 (05/30 0814) Resp:  [19-28] 22 (05/30 0814) BP: (90-120)/(43-74) 99/50 mmHg (05/30 0814) SpO2:  [95 %-100 %] 99 % (05/30 0814) Weight:  [68.4 kg (150 lb 12.7 oz)] 68.4 kg (150 lb 12.7 oz) (05/30 0551)  INTAKE / OUTPUT: Intake/Output     05/29 0701 - 05/30 0700 05/30 0701 - 05/31 0700   P.O. 240 360   Total Intake(mL/kg) 240 (3.5) 360 (5.3)   Urine (mL/kg/hr) 3100 (1.9)    Total Output 3100     Net -2860 +360          PHYSICAL EXAMINATION: Gen: chronically ill appearing, NAD  HEENT: NCAT, PERRL PULM: clear CV: RRR, no mgr, no JVD AB: BS infrequent, soft, nontender, no hsm Ext: warm, no edema Derm: no rash or skin breakdown Neuro: non  focal  LABS:  Recent Labs Lab 10/31/12 0530 11/03/12 1330 11/05/12 0515  NA 137 135 134*  K 4.0 4.7 4.5  CL 93* 95* 96  CO2 32 27 27  GLUCOSE 123* 175* 116*  BUN 22 19 16   CREATININE 0.94 1.04 1.07  CALCIUM 9.6 9.1 9.0   No results found for this basename: HGB, HCT, WBC, PLT,  in the last 168 hours  RADIOLOGY: No results found.  ASSESSMENT / PLAN:  Acute respiratory failure  - in setting of acute pulmonary edema r/t Takotsubo Cardiomyopathy COPD - on O2 at home prior to admit at night Aspiration pneumonia Moderate R pleural effusion - new 5/21, s/p thora 5/22>>Resolved  Aspiration  - distal esophageal stricture,  ? Peptic  P: -cont oxygen as at home -continue spiriva - cont PRN bronchodilator for d/c with hx of COPD -I am ok with snf rehab d/c as of 5/30 -pccm will sign off call again prn    Dorcas Carrow Beeper  [phone]  Cell  [phone]  If no response or cell goes to voicemail, call beeper [phone]  11/06/2012, 10:25 AM

## 2012-11-06 NOTE — Clinical Social Work Note (Signed)
Clinical Social Worker facilitated discharge by contacting facility, [facility] and spoke with patient. Patient reported that she will have to go by ambulance to facility. CSW will complete discharge packet and EMS form. CSW will sign off, as social work intervention is no longer needed.   Rozetta Nunnery MSW, [employer] [phone]

## 2012-11-06 NOTE — Progress Notes (Signed)
Patient ID: [name] [name], female   DOB: July 12, 1922, 79 y.o.   MRN: [id number] [facility] Gastroenterology Progress Note  Christianne Zacher 79 y.o. 1923/02/28   Subjective: Eating a lot better. Denies any further trouble swallowing liquids or food. Sitting in chair.  Objective: Vital signs: Filed Vitals:   11/06/12 0814  BP: 99/50  Pulse: 84  Temp: 97.5 F (36.4 C)  Resp: 22    Physical Exam: Gen: elderly, alert, no acute distress    Lab Results:  Recent Labs  11/03/12 1330 11/05/12 0515  NA 135 134*  K 4.7 4.5  CL 95* 96  CO2 27 27  GLUCOSE 175* 116*  BUN 19 16  CREATININE 1.04 1.07  CALCIUM 9.1 9.0   No results found for this basename: AST, ALT, ALKPHOS, BILITOT, PROT, ALBUMIN,  in the last 72 hours No results found for this basename: WBC, NEUTROABS, HGB, HCT, MCV, PLT,  in the last 72 hours    Assessment/Plan: S/P Savary dilation for esophageal stricture and tolerating soft foods without any further trouble. Will advance diet. Will sign off. Dr. Madilyn Fireman stated ok to anticoagulate and I agree. Call if questions.   Rozalynn Buege C. 11/06/2012, 8:57 AM

## 2012-11-06 NOTE — Telephone Encounter (Signed)
Pt d/c 5/17 Missed window for tcm

## 2012-11-06 NOTE — Discharge Summary (Signed)
Discharge Summary   Patient ID: [name] [name] MRN: [id number], DOB/AGE: February 26, 1923 79 y.o. Admit date: 10/24/2012 D/C date:     11/06/2012  Primary Cardiologist: New this admission - lives in [place] thus will establish with Dr. Kirke Corin  Primary Discharge Diagnoses:  1. Acute hypoxic respiratory failure from pulmonary edema/PNA requiring intubation this admission 2. Acute cardiogenic shock/acute systolic CHF secondary to Takotsubo syndrome - EF 20% by cath, improved to 40% by f/u echo  - BP limited med titration, not on ACEI due to hypotension 3. Ant-lat STEMI (secondary to Takotsubo) with mild CAD by cath 10/24/12 4. Community acquired pneumonia - treated with full course of antibiotics - needs f/u CXR 4-5 weeks to ensure clearance of infiltrates 5. Esophageal stricture s/p dilitation this admission 6. R pleural effusion s/p thoracentesis 10/28/12 7. Atrial fibrillation, newly recognized, rate controlled - started on Xarelto this admission  8. Claudication - abnormal ABIs (R 0.87, L 0.74) - pt to see Dr. Kirke Corin in f/u 9. COPD, on home O2 prior to admission 10. Deconditioning 11. GERD 12. Likely chronic renal insufficiency stage III - preserved Cr but CrCl 25ml/min  Secondary Discharge Diagnoses:  1. Diverticulosis 2. Breast CA  Hospital Course: [name] [name] is an 79 y/o F with history of breast CA, HTN, COPD on home O2 nightly, and no prior cardiac history who presented initially to [facility] on 10/24/12 with complaints of SOB. She had developed burning in her stomach and vomiting overnight, then developed dyspnea thus EMS was called. When EMS arrived, the patient was cyanotic with sat 60% on RA and she was not responsive. She required intubation and was initiated on pressors for hypotension later felt due to cardiogenic shock. Initial EKG showed SR without ST elevation but f/u tracings were concerning for STEMI. She was transferred emergently to [facility] and underwent  cardiac cath showing mild nonobstructive CAD, and severe segmental LV systolic dysfunction with EF 20% consistent with Takotsubo syndrome. CXR was worrisome for pneumonia (CAP) with possible component of aspiration, so she was treated with a full course of antibiotics.She was extubated the following morning and pressors were weaned. Blood pressure at that point was able to tolerate initiation of Lasix for acute systolic CHF as well as low dose ACEI. F/u 2D echo on 5/19 was technically limited and showed more global LV dysfunction with EF that appeared somewhat improved to the 40% range. She developed a R pleural effusion which improved with thoracentesis on 10/28/12. Several days later she was noted to have newly recognized atrial fibrillation with controlled rates. Anticoagulation was initially postponed due to need for a GI procedure - due to concern for aspiration, barium swallow showed silent penetration w/o gross aspiration, mild dysfunction in motility, and small distal stricture likely peptic stricture. Dr. Madilyn Fireman with GI followed along with the patient until she was medically stable to undergo esophageal dilitation, which was successful on 11/05/12. She was cleared for anticoagulation by GI and Xarelto was initiated at 15mg  daily (CrCl 46ml/min) due to high embolic risk from afib. From a CHF standpoint, she gradually improved and diuresed an impressive -15 liters and 14 lbs (discharge weight 150lb). Blood pressure remained soft, thus ACEI was discontinued due to hypotension. She was not started on statin this admission, consider initiation in followup. She has ambulated in the halls without complication, but SNF was recommended due to overall deconditioning. PCCM recommended to continue Spiriva and PRN bronchodilator at discharge. She has finished 10 days of Levaquin so will not  require further antibiotics at discharge. She will continue Coreg and Lasix at discharge (KCl does not appear to be needed as K level  is 4.5-4.7). Dr. Elease Hashimoto has seen and examined her and feels she is stable for discharge.   With regards to further necessary follow-up: - Radiology suggested f/u imaging as outpatient in 4-6 weeks to ensure clearance of pneumonic infiltrates - will defer to primary medical doctor. - The patient complained of claudication this admission. ABIs were abnormal at R 0.87, L 0.74. Dr. Elease Hashimoto has recommended f/u with PV clinic. She will establish with Dr. Kirke Corin in followup since she lives in [place]. - She will get established in the anticoag clinic due to new initiation of Xarelto. Dr. Elease Hashimoto recommended to continue baby ASA at discharge - will defer long-term continuation of this to her primary cardiologist.  Discharge Vitals: Blood pressure 99/50, pulse 84, temperature 97.5 F (36.4 C), temperature source Oral, resp. rate 22, height 5\' 6"  (1.676 m), weight 150 lb 12.7 oz (68.4 kg), SpO2 99.00%.  Labs: Lab Results  Component Value Date   WBC 7.3 10/28/2012   HGB 11.1* 10/28/2012   HCT 32.6* 10/28/2012   MCV 93.7 10/28/2012   PLT 167 10/28/2012     Recent Labs Lab 11/05/12 0515  NA 134*  K 4.5  CL 96  CO2 27  BUN 16  CREATININE 1.07  CALCIUM 9.0  GLUCOSE 116*   Results for [name] [name], [name] [name] (MRN [id number]) as of 11/06/2012 09:57  Ref. Range 10/24/2012 17:00 10/25/2012 00:40 10/25/2012 04:35  Troponin I Latest Range: <0.30 ng/mL 8.67 (HH) 9.36 (HH) 9.57 (HH)    Lab Results  Component Value Date   CHOL 104 10/25/2012   HDL 66 10/25/2012   LDLCALC 27 10/25/2012   TRIG 53 10/25/2012    Diagnostic Studies/Procedures    2D Echo 10/26/12 - Left ventricle: The study is technically very limited. It seems that the LV dsyfunction is more global than focal. The EF may be in the 40% range. - Aortic valve: Sclerosis without stenosis. - Right ventricle: Not able to assess RV function due to poor visualization. Not able to assess RV size due to poor visualization. - Pulmonary arteries: PA peak pressure:  34mm Hg (S).  Cardiac catheterization this admission, please see full report and above for summary.  Dg Chest 1 View 10/28/2012   *RADIOLOGY REPORT*  Clinical Data: Status post right-sided thoracentesis  CHEST - 1 VIEW  Comparison: 10/28/2012  Findings:  There has been interval decrease in volume of the right pleural effusion.  No pneumothorax after thoracentesis is noted.  The right upper lobe and left lower lobe infiltrates are again noted and appear unchanged from previous exam.  No new findings identified.  IMPRESSION:  1.  No pneumothorax identified after right-sided thoracentesis   Original Report Authenticated By: Signa Kell, M.D.   Dg Chest 2 View 10/30/2012   *RADIOLOGY REPORT*  Clinical Data: Weakness and shortness of breath.  CHEST - 2 VIEW  Comparison: 10/29/2012.  Findings: The cardiac silhouette, mediastinal and hilar contours are stable.  Stable left basilar density, right upper lobe infiltrate and small effusions.  IMPRESSION: Stable left basilar density, right upper lobe infiltrate and small effusions.   Original Report Authenticated By: Rudie Meyer, M.D.   Dg Chest 2 View 10/29/2012   *RADIOLOGY REPORT*  Clinical Data: Follow up pneumonia and pleural effusion.  CHEST - 2 VIEW  Comparison: 10/28/2012 and 10/26/2012.  Findings: The heart size and mediastinal contours are stable. There  is aortic atherosclerosis.  The right upper lobe infiltrate and left lower lobe nodular densities are unchanged.  There are small bilateral pleural effusions.  No pneumothorax is identified. Surgical clips are present within the left axilla.  IMPRESSION: No significant change in the right upper lobe infiltrate and small pleural effusions.  Continued follow-up recommended.   Original Report Authenticated By: Carey Bullocks, M.D.   Dg Esophagus 10/26/2012   *RADIOLOGY REPORT*  Clinical Data: Dysphagia with regurgitation.  History of gastroesophageal reflux disease.  ESOPHOGRAM / BARIUM SWALLOW / BARIUM TABLET  STUDY  Technique:  Combined double contrast and single contrast examination performed using effervescent crystals, thick barium liquid, and thin barium liquid.  The patient was observed with fluoroscopy swallowing a 13mm barium sulphate tablet.  Fluoroscopy time:  1 minute 9 seconds.  Comparison:  Portable chest 10/26/2012.  Findings:  Examination is mildly limited by the patient's limited mobility.  Silent laryngeal penetration was noted.  There was no gross aspiration into the tracheobronchial tree.  There is mild presbyesophagus.  There is a small hiatal hernia associated with a small distal esophageal stricture.  No mucosal ulceration is identified.  However, this distal stricture prevented the passage of a 13 mm barium tablet (observed intermittently over approximately 3 mm of fluoroscopy).  IMPRESSION:  1.  Silent laryngeal penetration.  No gross aspiration identified. Consider dedicated speech therapy evaluation. 2.  Mild presbyesophagus. 3.  Distal esophageal stricture prevents the passage of a 13 mm barium tablet. This is associated with a small hiatal hernia and is likely a peptic stricture.   Original Report Authenticated By: Carey Bullocks, M.D.   Dg Chest 2v Repeat Same Day 10/28/2012   *RADIOLOGY REPORT*  Clinical Data: Short of breath, weak, pneumonia  CHEST - 2 VIEW SAME DAY  Comparison: Prior chest x-ray 10/26/2012  Findings: Interval development of a moderate right and small left pleural effusions.  A small left effusion may have been present previously.  Persistent airspace disease throughout the right upper and to a lesser extent right lower lobes.  Stable cardiomegaly. Atherosclerosis of the transverse aorta. Surgical clips in the left axilla.  No acute osseous abnormality.  IMPRESSION: 1.  Interval development of a moderate right pleural (likely parapneumonic) effusion. 2.  Similar appearance of the right upper greater than lower lobe infiltrates consistent with the clinical history of  pneumonia. Please note that can take 4 - 6 weeks for pneumonic infiltrates to resolve by conventional chest x-ray.  If the opacities persist beyond this time frame, recommend CT scan of the chest to evaluate for underlying malignancy. 3.  Probable stable trace left pleural effusion.   Original Report Authenticated By: Malachy Moan, M.D.   Dg Chest Port 1 View 10/26/2012   *RADIOLOGY REPORT*  Clinical Data: Pneumonia.  PORTABLE CHEST - 1 VIEW  Comparison: 10/25/2012.  Findings: The left IJ catheter is stable.  The endotracheal tube and NG tubes have been removed.  There are persistent right upper lobe and right lower lobe infiltrates. Persistent left lower lobe atelectasis.  No pneumothorax.  IMPRESSION: Removal of ET and NG tubes. Persistent right upper lobe and right lower lobe infiltrates.   Original Report Authenticated By: Rudie Meyer, M.D.   Dg Chest Port 1 View 10/25/2012   *RADIOLOGY REPORT*  Clinical Data: Pulmonary infiltrates.  PORTABLE CHEST - 1 VIEW  Comparison: 10/24/2012  Findings: Endotracheal tube, NG tube, and central venous catheter in place, essentially unchanged.  The infiltrate at the right base has slightly progressed.  Infiltrate in the right upper lobe is unchanged.  There is new increased density at the left base which I suspect is a combination of effusion and atelectasis.  Pulmonary vascularity heart size are normal.  IMPRESSION: New effusion and atelectasis at the left base.  Slight increased infiltrate at the right base.  No change in the right upper lobe infiltrate.   Original Report Authenticated By: Francene Boyers, M.D.   Portable Chest Xray 10/24/2012   *RADIOLOGY REPORT*  Clinical Data: Endotracheal tube and central venous catheter placement.  PORTABLE CHEST - 1 VIEW  Comparison: None.  Findings: The endotracheal tube is 5.6 cm above the carina.  Left IJ central venous catheter terminates at the junction of the distal superior vena cava and right atrium.  There is extensive  airspace disease in the right upper lobe.  Lesser degree of airspace disease is seen at the medial right lung base.  There is a focal opacity at the left lung apex could be due to airspace disease and/or chronic pleuroparenchymal thickening.  There are no prior studies for comparison.  There is a faint patchy opacity at the lateral left lung base suspicious for focal airspace disease.  Negative for pneumothorax.  Heart size is within normal limits.  There is atherosclerotic calcification of the thoracic aortic arch.  The right costophrenic angle is blunted.  Surgical clips in the left axilla.  IMPRESSION:  1.  Bilateral airspace disease, most prominent in the right upper lobe is suspicious for multilobar pneumonia. 2.  Satisfactory position of left IJ central venous catheter and endotracheal tube. 3.  Negative for pneumothorax. 4.  Suspect small right pleural effusion.   Original Report Authenticated By: Britta Mccreedy, M.D.   US Thoracentesis Asp Pleural Space W/img Guide 10/28/2012   *RADIOLOGY REPORT*  Clinical Data:  Pneumonia, right-sided pleural effusion.  A request for diagnostic and therapeutic thoracentesis  ULTRASOUND GUIDED right THORACENTESIS  Comparison:  None  An ultrasound guided thoracentesis was thoroughly discussed with the patient and questions answered.  The benefits, risks, alternatives and complications were also discussed.  The patient understands and wishes to proceed with the procedure.  Written consent was obtained.  Ultrasound was performed to localize and mark an adequate pocket of fluid in the right chest.  The area was then prepped and draped in the normal sterile fashion.  1% Lidocaine was used for local anesthesia.  Under ultrasound guidance a 19 gauge Yueh catheter was introduced.  Thoracentesis was performed.  The catheter was removed and a dressing applied.  Complications:  None immediate  Findings: A total of approximately 280 ml of hazy golden pleural fluid was removed. A fluid  sample was sent for laboratory analysis.  IMPRESSION: Successful ultrasound guided right thoracentesis yielding 280 ml of pleural fluid.  Read by Brayton El PA-C   Original Report Authenticated By: Tacey Ruiz, MD    Discharge Medications     Medication List    STOP taking these medications       atenolol 25 MG tablet  Commonly known as:  TENORMIN     enalapril 20 MG tablet  Commonly known as:  VASOTEC      TAKE these medications       aspirin 81 MG EC tablet  Commonly known as:  aspirin EC  Take 1 tablet (81 mg total) by mouth daily.     AZO CRANBERRY PO  Take 2 tablets by mouth daily.     carvedilol 6.25 MG tablet  Commonly known  as:  COREG  Take 1 tablet (6.25 mg total) by mouth 2 (two) times daily with a meal.     fluticasone 50 MCG/ACT nasal spray  Commonly known as:  FLONASE  Place 2 sprays into the nose daily.     furosemide 40 MG tablet  Commonly known as:  LASIX  Take 1 tablet (40 mg total) by mouth daily.     levalbuterol 45 MCG/ACT inhaler  Commonly known as:  XOPENEX HFA  Inhale 2 puffs into the lungs every 6 (six) hours as needed for wheezing or shortness of breath.     loratadine 10 MG tablet  Commonly known as:  CLARITIN  Take 10 mg by mouth daily.     omeprazole 20 MG capsule  Commonly known as:  PRILOSEC  Take 1 capsule (20 mg total) by mouth 2 (two) times daily.     ONE-A-DAY WOMENS PO  Take 1 tablet by mouth daily.     pyridOXINE 100 MG tablet  Commonly known as:  VITAMIN B-6  Take 100 mg by mouth daily.     Rivaroxaban 15 MG Tabs tablet  Commonly known as:  XARELTO  Take 1 tablet (15 mg total) by mouth daily with supper.     tiotropium 18 MCG inhalation capsule  Commonly known as:  SPIRIVA  Place 18 mcg into inhaler and inhale daily.     vitamin B-12 1000 MCG tablet  Commonly known as:  CYANOCOBALAMIN  Take 1,000 mcg by mouth daily.     vitamin C 500 MG tablet  Commonly known as:  ASCORBIC ACID  Take 500 mg by mouth daily.         Disposition   The patient will be discharged in stable condition to home. Discharge Orders   Future Appointments Provider Department Dept Phone   11/12/2012 10:45 AM Iran Ouch, MD Selena Batten at [place] [phone]   12/09/2012 2:00 PM [facility] at [facility] [phone]   Future Orders Complete By Expires     Diet - low sodium heart healthy  As directed     Comments:      Hospital diet: Diet Dysphagia 3 with thin liquids    Discharge instructions  As directed     Comments:      One of your heart tests showed weakness of the heart muscle this admission. This may make you more susceptible to weight gain from fluid retention, which can lead to symptoms that we call heart failure. For patients with congestive heart failure, we give them these special instructions:  1. Follow a low-salt diet and watch your fluid intake. In general, you should not be taking in more than 2 liters of fluid per day (no more than 8 glasses per day). Some patients are restricted to less than 1.5 liters of fluid per day (no more than 6 glasses per day). This includes sources of water in foods like soup, coffee, tea, milk, etc. 2. Weigh yourself on the same scale at same time of day and keep a log. 3. Call your doctor: (Anytime you feel any of the following symptoms)  - 3-4 pound weight gain in 1-2 days or 2 pounds overnight  - Shortness of breath, with or without a dry hacking cough  - Swelling in the hands, feet or stomach  - If you have to sleep on extra pillows at night in order to breathe   IT IS IMPORTANT TO LET YOUR DOCTOR KNOW EARLY ON IF YOU ARE  HAVING SYMPTOMS SO WE CAN HELP YOU!    Increase activity slowly  As directed     Comments:      If you notice increased pain, swelling, bleeding or pus at catheterization site, call/return! If you notice any unusual bleeding including blood in stool, black tarry stools, or blood in your urine, call your doctor  immediately.      Follow-up Information   Follow up with Lorine Bears, MD On 11/12/2012. (10:45am)    Contact information:   Architectural technologist [facility] office [address], [address] [place] [address] [phone]       Follow up with Selena Batten at [facility] On 12/09/2012. (2pm - education visit/follow-up visit in our [facility] since you were started on Xarelto in the hospital.)    Contact information:   [facility] office [address] [place] [address] [phone]      Follow up with Primary Care Doctor or Nursing Home Doctor. (Please follow up with your medical doctor for a repeat chest x-ray in 4-5 weeks to make sure your pneumonia has cleared.)         Duration of Discharge Encounter: Greater than 30 minutes including physician and PA time.  Signed, Ronie Spies PA-C 11/06/2012, 10:01 AM  Attending Note:  Please see my note from earlier today.  [place] Mixer, Montez Hageman., MD, [facility] 11/06/2012, 3:46 PM Office - [phone] Pager [phone]

## 2012-11-09 ENCOUNTER — Telehealth: Payer: Self-pay | Admitting: *Deleted

## 2012-11-09 NOTE — Telephone Encounter (Signed)
TCM phone call - no answer/unable to leave message/PE 11/09/12@1pm 

## 2012-11-11 ENCOUNTER — Encounter: Payer: Self-pay | Admitting: *Deleted

## 2012-11-12 ENCOUNTER — Encounter: Payer: Medicare Other | Admitting: Cardiovascular Disease

## 2012-11-16 ENCOUNTER — Telehealth: Payer: Self-pay

## 2012-11-16 NOTE — Telephone Encounter (Signed)
Message copied by Marcelle Overlie on Mon Nov 16, 2012  4:35 PM ------      Message from: Coralee Rud      Created: Mon Nov 16, 2012  4:00 PM      Regarding: toc/ph       12-04-12 dr Kirke Corin 11:00 ------

## 2012-11-16 NOTE — Telephone Encounter (Signed)
TCM  

## 2012-11-17 NOTE — Telephone Encounter (Signed)
Pt has not been hospitalized since May Out of TCM window

## 2012-12-04 ENCOUNTER — Telehealth: Payer: Self-pay

## 2012-12-04 ENCOUNTER — Ambulatory Visit (INDEPENDENT_AMBULATORY_CARE_PROVIDER_SITE_OTHER): Payer: Medicare Other | Admitting: Cardiovascular Disease

## 2012-12-04 ENCOUNTER — Encounter: Payer: Self-pay | Admitting: Cardiovascular Disease

## 2012-12-04 VITALS — BP 100/54 | HR 80 | Ht 67.0 in | Wt 160.0 lb

## 2012-12-04 DIAGNOSIS — I739 Peripheral vascular disease, unspecified: Secondary | ICD-10-CM

## 2012-12-04 DIAGNOSIS — I5181 Takotsubo syndrome: Secondary | ICD-10-CM

## 2012-12-04 DIAGNOSIS — R0602 Shortness of breath: Secondary | ICD-10-CM

## 2012-12-04 DIAGNOSIS — I4891 Unspecified atrial fibrillation: Secondary | ICD-10-CM

## 2012-12-04 NOTE — Telephone Encounter (Signed)
Pt told Dr. Kirke Corin during visit she had to pay >$300 for Xarelto for 1 month supply I called [employer] rep to investigate He advised I call [employer] at [employer] to have them open a benefits investigation I spoke with  Rep who says she is faxing me a "bsuiness agreement" that would allow them to open up this investigation I will complete this and fax back to them so this investigation can get started on this pt

## 2012-12-04 NOTE — Telephone Encounter (Signed)
I received fax from [employer] Forms were completed Pt needs to sign Will call her at #'s provided [phone] or [phone]

## 2012-12-04 NOTE — Patient Instructions (Addendum)
Stop Aspirin Labs today.  Follow up in 3 months.

## 2012-12-07 ENCOUNTER — Encounter: Payer: Self-pay | Admitting: Cardiovascular Disease

## 2012-12-07 DIAGNOSIS — I739 Peripheral vascular disease, unspecified: Secondary | ICD-10-CM | POA: Insufficient documentation

## 2012-12-07 NOTE — Assessment & Plan Note (Signed)
Her ventricular rate is controlled. Continue long-term anticoagulation with renal dose Xarelto. Given her extensive bruising, I will check CBC. I will also stop aspirin to minimize the risk of bleeding.

## 2012-12-07 NOTE — Telephone Encounter (Signed)
No answer at home # provided "mailbox is full" on cell # provided Will try again later

## 2012-12-07 NOTE — Assessment & Plan Note (Signed)
She was found to have mildly reduced ABI bilaterally. She currently does not have clear symptoms of claudication. She has not resumed her previous activity level. I will continue to monitor this but most likely she would be treated medically.

## 2012-12-07 NOTE — Assessment & Plan Note (Signed)
Initial ejection fraction was 20% by cath and followup was 40% by echo. She appears to be euvolemic on current dose of Lasix. She is not a particularly an ACE inhibitor due to continued low blood pressure. Continue current dose of carvedilol. I would try to initiate a small dose ACE inhibitor upon followup if blood pressure is improved. I will check basic metabolic profile given that she is on a diuretic.

## 2012-12-07 NOTE — Progress Notes (Signed)
HPI  This is a pleasant 79 year old female who is here today for a followup visit after recent prolonged hospitalization at [facility]. She presented initially to [facility] on 5/17 with acute respiratory failure. She was found to have an anterolateral ST elevation on EKG with troponin of 1.4. She was transferred to [facility] where she underwent emergent cardiac catheterization. It showed mild nonobstructive coronary artery disease with severe segmental LV systolic dysfunction suggestive of stress-induced cardiomyopathy. The patient was intubated. She developed right pleural effusion which required thoracentesis. She also developed atrial fibrillation which was treated with rate control. She was found to have an esophageal stricture which was dilated by GI. She ultimately was started on anticoagulation. She improved gradually and was discharged to a skilled nursing facility where she stayed there for a few weeks. She is now back home. She is gradually getting better. She does complain of extensive bruising. She denies any chest discomfort. Dyspnea and orthopnea improved. She was found to have evidence of peripheral arterial disease with mildly reduced ABI bilaterally. She denies lower extremity discomfort. However, she is not back to her previous functional status.  Allergies  Allergen Reactions  . Penicillins Itching, Swelling and Rash  . Sulfa Antibiotics      Current Outpatient Prescriptions on File Prior to Visit  Medication Sig Dispense Refill  . carvedilol (COREG) 6.25 MG tablet Take 1 tablet (6.25 mg total) by mouth 2 (two) times daily with a meal.      . Cranberry-Vitamin C-Probiotic (AZO CRANBERRY PO) Take 2 tablets by mouth daily.      . fluticasone (FLONASE) 50 MCG/ACT nasal spray Place 2 sprays into the nose daily as needed.       . furosemide (LASIX) 40 MG tablet Take 1 tablet (40 mg total) by mouth daily.      [name] levalbuterol (XOPENEX HFA) 45 MCG/ACT inhaler Inhale 2 puffs into the lungs every 6  (six) hours as needed for wheezing or shortness of breath.      . loratadine (CLARITIN) 10 MG tablet Take 10 mg by mouth daily.      . Multiple Vitamins-Calcium (ONE-A-DAY WOMENS PO) Take 1 tablet by mouth daily.      . Rivaroxaban (XARELTO) 15 MG TABS tablet Take 1 tablet (15 mg total) by mouth daily with supper.      . tiotropium (SPIRIVA) 18 MCG inhalation capsule Place 18 mcg into inhaler and inhale daily.      . vitamin B-12 (CYANOCOBALAMIN) 1000 MCG tablet Take 1,000 mcg by mouth daily.      . vitamin C (ASCORBIC ACID) 500 MG tablet Take 500 mg by mouth daily.       No current facility-administered medications on file prior to visit.     Past Medical History  Diagnosis Date  . HTN (hypertension)   . COPD (chronic obstructive pulmonary disease)     a. on home O2 nightly.  . Diverticulosis   . Takotsubo cardiomyopathy 10/2012    a. 10/2012: presented w/ cardiogenic shock/systolic CHF/VDRF - EF 20% by cath, 40% by f/u echo - cath with mild nonobstructive CAD. b. Not discharged on ACEI due to hypotension.  . Cardiogenic shock     a. 10/2012 - due to Takotsubo CM.  [name] Respiratory failure     a. On home O2 nightly. b. VDRF 10/2012 - due to CAP/pulm edema in setting of cardiogenic shock/Takotsubo CM.  [name] CAP (community acquired pneumonia)     a. 10/2012 - will need f/u  CXR in June 2014 to ensure clearance.  . Esophageal stricture     a. s/p dilitation 10/2012.  [name] Pleural effusion, right     a. s/p thoracentesis 10/28/12.  . Atrial fibrillation     a. Dx 10/2012, rate controlled, started on Xarelto.  . Claudication     a. ABI 10/2012 - R 0.87, L 0.74.  [name] GERD (gastroesophageal reflux disease)   . CKD (chronic kidney disease), stage III   . Breast CA     hx  . Abdominal hernia     chronic  . CAD (coronary artery disease)     a. 10/2012 - presented w/ STEMI felt due to Takotsubo - mild nonobstructive dz by cath.  [name] STEMI (ST elevation myocardial infarction)      Past Surgical History    Procedure Laterality Date  . Mastectomy    . Cataract extraction    . Esophagogastroduodenoscopy N/A 11/05/2012    Procedure: ESOPHAGOGASTRODUODENOSCOPY (EGD) with esophageal savary dilatation;  Surgeon: Barrie Folk, MD;  Location: [facility] ENDOSCOPY;  Service: Endoscopy;  Laterality: N/A;  savary dil...no floro needed  . Bladder surgery       Family History  Problem Relation Age of Onset  . CAD       History   Social History  . Marital Status: Widowed    Spouse Name: N/A    Number of Children: N/A  . Years of Education: N/A   Occupational History  . Not on file.   Social History Main Topics  . Smoking status: Never Smoker   . Smokeless tobacco: Never Used  . Alcohol Use: No  . Drug Use: No  . Sexually Active: No   Other Topics Concern  . Not on file   Social History Narrative  . No narrative on file     PHYSICAL EXAM   BP 100/54  Pulse 80  Ht 5\' 7"  (1.702 m)  Wt 160 lb (72.576 kg)  BMI 25.05 kg/m2 Constitutional: She is oriented to person, place, and time. She appears well-developed and well-nourished. No distress.  HENT: No nasal discharge.  Head: Normocephalic and atraumatic.  Eyes: Pupils are equal and round.  Neck: Normal range of motion. Neck supple. No JVD present. No thyromegaly present.  Cardiovascular: Normal rate, irregular rhythm, normal heart sounds. Exam reveals no gallop and no friction rub. No murmur heard.  Pulmonary/Chest: Effort normal and breath sounds normal. No stridor. No respiratory distress. She has no wheezes. She has no rales. She exhibits no tenderness.  Abdominal: Soft. Bowel sounds are normal. She exhibits no distension. There is no tenderness. There is no rebound and no guarding.  Musculoskeletal: Normal range of motion. She exhibits trace edema and no tenderness.  Neurological: She is alert and oriented to person, place, and time. Coordination normal.  Skin: Skin is warm and dry. No rash noted. She is not diaphoretic. No erythema.  No pallor.  Psychiatric: She has a normal mood and affect. Her behavior is normal. Judgment and thought content normal.     QIO:NGEXBM fibrillation  -Old anteroseptal infarct.   -  Nonspecific T-abnormality.   ABNORMAL    ASSESSMENT AND PLAN

## 2012-12-09 ENCOUNTER — Ambulatory Visit (INDEPENDENT_AMBULATORY_CARE_PROVIDER_SITE_OTHER): Payer: Medicare Other | Admitting: *Deleted

## 2012-12-09 DIAGNOSIS — I4891 Unspecified atrial fibrillation: Secondary | ICD-10-CM

## 2012-12-09 NOTE — Telephone Encounter (Signed)
Pt informed Signed paperwork while here for coumadin appt Faxed Xarelto paperwork Will await for "benefit investigation" results

## 2012-12-09 NOTE — Patient Instructions (Signed)
  A full discussion of the nature of anticoagulants has been carried out.  A benefit/risk analysis has been presented to the patient, so that they understand the justification for choosing anticoagulation with Xarelto at this time.  The need for compliance is stressed.  Pt is aware to take the medication once daily with the largest meal of the day.  Side effects of potential bleeding are discussed, including unusual colored urine or stools, coughing up blood or coffee ground emesis, nose bleeds or serious fall or head trauma.  Discussed signs and symptoms of stroke. The patient should avoid any OTC items containing aspirin or ibuprofen.  Avoid alcohol consumption.   Call if any signs of abnormal bleeding.  Discussed financial obligations and resolved any difficulty in obtaining medication.  Next lab test test in 6 months on June 16, 2013 at 2:00pm.

## 2012-12-09 NOTE — Progress Notes (Signed)
Pt was started on Xarelto for Afib on 10/2012.    Reviewed patients medication list.  Pt is not currently on any combined P-gp and strong CYP3A4 inhibitors/inducers (ketoconazole, traconazole, ritonavir, carbamazepine, phenytoin, rifampin, St. John's wort).  Reviewed labs from 12/04/12,   SCr 1.06 , Weight 72.57 Kg , CrCl- 42.03.     Dose appropriate  based on CrCl.   Hgb and HCT 11.5/34.6 .   A full discussion of the nature of anticoagulants has been carried out.  A benefit/risk analysis has been presented to the patient, so that they understand the justification for choosing anticoagulation with Xarelto at this time.  The need for compliance is stressed.  Pt is aware to take the medication once daily with the largest meal of the day.  Side effects of potential bleeding are discussed, including unusual colored urine or stools, coughing up blood or coffee ground emesis, nose bleeds or serious fall or head trauma.  Discussed signs and symptoms of stroke. The patient should avoid any OTC items containing aspirin or ibuprofen.  Avoid alcohol consumption.   Call if any signs of abnormal bleeding.  Discussed financial obligations and resolved any difficulty in obtaining medication.  Next lab test test in 6 months.

## 2012-12-22 ENCOUNTER — Other Ambulatory Visit: Payer: Self-pay | Admitting: *Deleted

## 2012-12-22 NOTE — Telephone Encounter (Signed)
Medicine is very expensive and needs generic

## 2012-12-22 NOTE — Telephone Encounter (Signed)
Please review and advise.  See below

## 2012-12-23 NOTE — Telephone Encounter (Signed)
Pt mentioned that Xarelto is to expensive and is not able to afford. She is requesting generic or wondering if there is alternative. She is aware that I have forwarded message to Dr. Kirke Corin.  She currently only has a few tablets left. She is aware that we have samples here if she happens to need some before we hear back from [place]. Please advise.

## 2012-12-24 ENCOUNTER — Telehealth: Payer: Self-pay | Admitting: *Deleted

## 2012-12-24 NOTE — Telephone Encounter (Signed)
Returned call to pt advised pt should not be taking Atenolol, she should be taking Coreg 6.25mg  BID.  Pt confirmed she is currently taking Coreg 6.25mg  BID. Advised pt Atenolol has been d/c and she should not be taking at the present.  Pt verbalized understanding.

## 2012-12-24 NOTE — Telephone Encounter (Signed)
Patient has a question about her rx: Atenolol not sure if she should still be taking this med... Please advise

## 2012-12-25 ENCOUNTER — Telehealth: Payer: Self-pay

## 2012-12-25 MED ORDER — RIVAROXABAN 15 MG PO TABS: 15.0000 mg | ORAL_TABLET | Freq: Every day | ORAL | Status: DC

## 2012-12-25 NOTE — Telephone Encounter (Signed)
Pt has got approved for Xarelto which would only require her to pay $6.00 instead of $300 at pharmacy. She would like to continue with Xarelto due to her receiving a letter from insurance on 12/24/12 saying that she is now approved for the medication.

## 2012-12-25 NOTE — Telephone Encounter (Signed)
Error

## 2012-12-25 NOTE — Telephone Encounter (Signed)
Please advise what she could take cheaper than xarelt.

## 2012-12-28 ENCOUNTER — Other Ambulatory Visit: Payer: Self-pay | Admitting: *Deleted

## 2012-12-28 MED ORDER — RIVAROXABAN 15 MG PO TABS: 15.0000 mg | ORAL_TABLET | Freq: Every day | ORAL | Status: DC

## 2012-12-28 NOTE — Telephone Encounter (Signed)
Sent rx for Xarelto 15mg  tablets to pharmacy as well as samples given to pt's daughter today in office.

## 2012-12-28 NOTE — Telephone Encounter (Signed)
Needs samples of Xarelto 15 mg

## 2012-12-28 NOTE — Telephone Encounter (Signed)
3 boxes #15 tablets of Xarelto 15mg  given to pt's daughter.  Rx refill sent to pt's pharmacy as requested.

## 2012-12-30 ENCOUNTER — Other Ambulatory Visit: Payer: Self-pay | Admitting: *Deleted

## 2012-12-30 MED ORDER — CARVEDILOL 6.25 MG PO TABS: 6.2500 mg | ORAL_TABLET | Freq: Two times a day (BID) | ORAL | Status: DC

## 2012-12-30 MED ORDER — FUROSEMIDE 40 MG PO TABS: 40.0000 mg | ORAL_TABLET | Freq: Every day | ORAL | Status: DC

## 2012-12-30 NOTE — Telephone Encounter (Signed)
Refilled Furosemide and Carvedilol sent to [facility] pharmacy for 2 month supply due to pt going on vacation.

## 2012-12-30 NOTE — Telephone Encounter (Signed)
Patient would like a 2 month supply. Patient is going on vacation.

## 2013-01-01 ENCOUNTER — Other Ambulatory Visit: Payer: Self-pay

## 2013-01-01 MED ORDER — CARVEDILOL 6.25 MG PO TABS: 6.2500 mg | ORAL_TABLET | Freq: Two times a day (BID) | ORAL | Status: DC

## 2013-01-01 MED ORDER — FUROSEMIDE 40 MG PO TABS: 40.0000 mg | ORAL_TABLET | Freq: Every day | ORAL | Status: DC

## 2013-01-01 NOTE — Telephone Encounter (Signed)
Refilled Carvedilol and Furosemide sent to [facility] pharmacy.

## 2013-01-01 NOTE — Telephone Encounter (Signed)
Pt is completely out, requests 2 mo supply

## 2013-01-04 ENCOUNTER — Other Ambulatory Visit: Payer: Self-pay

## 2013-01-04 ENCOUNTER — Telehealth: Payer: Self-pay

## 2013-01-04 MED ORDER — FUROSEMIDE 40 MG PO TABS: 40.0000 mg | ORAL_TABLET | Freq: Every day | ORAL | Status: DC

## 2013-01-04 NOTE — Telephone Encounter (Signed)
Refilled Furosemide sent to [facility] pharmacy. 

## 2013-01-04 NOTE — Telephone Encounter (Signed)
[name] [name], pt daughter in law came in and states pt ins is requiring prior Berkley Harvey, wants to know what pt should do if she runs out of medication, during the auth process. Please call.

## 2013-01-04 NOTE — Telephone Encounter (Signed)
Called [phone] pt does not qualify for pt assistance through their company secondary to the fact she has insurance coverage.  She does qualify for 30 day free card and her insurance [employer] does require PA.  Called for PA [phone] pt has been approved and letter is being faxed over to reflect this.  Called Xarelto rep Matt for 30 day free card to activate for pt as well as tried to return call to pt's daughter Sedalia Muta.

## 2013-01-04 NOTE — Telephone Encounter (Signed)
Advised pt 10 day free trial Xarelto left at front desk for her daughter to pick-up pharmacy will honor for 30 day free trial.  Prior auth completed and approved by insurance should be able to fill rx whenever needed at [facility].  Pt verbalized understanding will send daughter to pick up card.

## 2013-03-04 ENCOUNTER — Ambulatory Visit: Payer: Medicare Other | Admitting: Cardiovascular Disease

## 2013-03-05 ENCOUNTER — Ambulatory Visit: Payer: Medicare Other | Admitting: Cardiovascular Disease

## 2013-03-11 ENCOUNTER — Encounter: Payer: Self-pay | Admitting: *Deleted

## 2013-03-11 ENCOUNTER — Ambulatory Visit (INDEPENDENT_AMBULATORY_CARE_PROVIDER_SITE_OTHER): Payer: Medicare Other | Admitting: Cardiovascular Disease

## 2013-03-11 ENCOUNTER — Telehealth: Payer: Self-pay | Admitting: *Deleted

## 2013-03-11 ENCOUNTER — Encounter: Payer: Self-pay | Admitting: Cardiovascular Disease

## 2013-03-11 VITALS — BP 138/70 | HR 64 | Ht 67.0 in | Wt 161.8 lb

## 2013-03-11 DIAGNOSIS — I5022 Chronic systolic (congestive) heart failure: Secondary | ICD-10-CM

## 2013-03-11 DIAGNOSIS — I739 Peripheral vascular disease, unspecified: Secondary | ICD-10-CM

## 2013-03-11 DIAGNOSIS — I5181 Takotsubo syndrome: Secondary | ICD-10-CM

## 2013-03-11 DIAGNOSIS — I4891 Unspecified atrial fibrillation: Secondary | ICD-10-CM

## 2013-03-11 MED ORDER — LOSARTAN POTASSIUM 25 MG PO TABS: 25.0000 mg | ORAL_TABLET | Freq: Every day | ORAL | Status: DC

## 2013-03-11 NOTE — Progress Notes (Signed)
HPI  This is a pleasant 79 year old female who is here today for a followup visit . She presented to [facility] on 5/17 with acute respiratory failure. She was found to have an anterolateral ST elevation on EKG with troponin of 1.4. She was transferred to [facility] where she underwent emergent cardiac catheterization. It showed mild nonobstructive coronary artery disease with severe segmental LV systolic dysfunction suggestive of stress-induced cardiomyopathy. The patient was intubated. She developed right pleural effusion which required thoracentesis. She also developed atrial fibrillation which was treated with rate control. She was found to have an esophageal stricture which was dilated by GI. She ultimately was started on anticoagulation. She improved gradually and was discharged to a skilled nursing facility where she stayed there for a few weeks.  She was found to have evidence of peripheral arterial disease with mildly reduced ABI bilaterally. No significant claudication.  She has been stable from a cardiac standpoint.   Allergies  Allergen Reactions  . Penicillins Itching, Swelling and Rash  . Sulfa Antibiotics      Current Outpatient Prescriptions on File Prior to Visit  Medication Sig Dispense Refill  . carvedilol (COREG) 6.25 MG tablet Take 1 tablet (6.25 mg total) by mouth 2 (two) times daily with a meal.  120 tablet  3  . Cranberry-Vitamin C-Probiotic (AZO CRANBERRY PO) Take 2 tablets by mouth daily.      . fluticasone (FLONASE) 50 MCG/ACT nasal spray Place 2 sprays into the nose daily as needed.       . furosemide (LASIX) 40 MG tablet Take 1 tablet (40 mg total) by mouth daily.  60 tablet  3  . levalbuterol (XOPENEX HFA) 45 MCG/ACT inhaler Inhale 2 puffs into the lungs every 6 (six) hours as needed for wheezing or shortness of breath.      . loratadine (CLARITIN) 10 MG tablet Take 10 mg by mouth daily.      . Multiple Vitamins-Calcium (ONE-A-DAY WOMENS PO) Take 1 tablet by mouth daily.       [name] omeprazole (PRILOSEC) 20 MG capsule Take 20 mg by mouth daily.      . Rivaroxaban (XARELTO) 15 MG TABS tablet Take 1 tablet (15 mg total) by mouth daily with supper.  30 tablet  6  . tiotropium (SPIRIVA) 18 MCG inhalation capsule Place 18 mcg into inhaler and inhale daily.      . vitamin B-12 (CYANOCOBALAMIN) 1000 MCG tablet Take 1,000 mcg by mouth daily.      . vitamin C (ASCORBIC ACID) 500 MG tablet Take 500 mg by mouth daily.       No current facility-administered medications on file prior to visit.     Past Medical History  Diagnosis Date  . HTN (hypertension)   . COPD (chronic obstructive pulmonary disease)     a. on home O2 nightly.  . Diverticulosis   . Takotsubo cardiomyopathy 10/2012    a. 10/2012: presented w/ cardiogenic shock/systolic CHF/VDRF - EF 20% by cath, 40% by f/u echo - cath with mild nonobstructive CAD. b. Not discharged on ACEI due to hypotension.  . Cardiogenic shock     a. 10/2012 - due to Takotsubo CM.  [name] Respiratory failure     a. On home O2 nightly. b. VDRF 10/2012 - due to CAP/pulm edema in setting of cardiogenic shock/Takotsubo CM.  [name] CAP (community acquired pneumonia)     a. 10/2012 - will need f/u CXR in June 2014 to ensure clearance.  . Esophageal stricture  a. s/p dilitation 10/2012.  [name] Pleural effusion, right     a. s/p thoracentesis 10/28/12.  . Atrial fibrillation     a. Dx 10/2012, rate controlled, started on Xarelto.  . Claudication     a. ABI 10/2012 - R 0.87, L 0.74.  [name] GERD (gastroesophageal reflux disease)   . CKD (chronic kidney disease), stage III   . Breast CA     hx  . Abdominal hernia     chronic  . CAD (coronary artery disease)     a. 10/2012 - presented w/ STEMI felt due to Takotsubo - mild nonobstructive dz by cath.  [name] STEMI (ST elevation myocardial infarction)      Past Surgical History  Procedure Laterality Date  . Mastectomy    . Cataract extraction    . Esophagogastroduodenoscopy N/A 11/05/2012    Procedure:  ESOPHAGOGASTRODUODENOSCOPY (EGD) with esophageal savary dilatation;  Surgeon: Barrie Folk, MD;  Location: [facility] ENDOSCOPY;  Service: Endoscopy;  Laterality: N/A;  savary dil...no floro needed  . Bladder surgery       Family History  Problem Relation Age of Onset  . CAD       History   Social History  . Marital Status: Widowed    Spouse Name: N/A    Number of Children: N/A  . Years of Education: N/A   Occupational History  . Not on file.   Social History Main Topics  . Smoking status: Never Smoker   . Smokeless tobacco: Never Used  . Alcohol Use: No  . Drug Use: No  . Sexual Activity: No   Other Topics Concern  . Not on file   Social History Narrative  . No narrative on file     PHYSICAL EXAM   BP 138/70  Pulse 64  Ht 5\' 7"  (1.702 m)  Wt 161 lb 12.8 oz (73.392 kg)  BMI 25.34 kg/m2 Constitutional: She is oriented to person, place, and time. She appears well-developed and well-nourished. No distress.  HENT: No nasal discharge.  Head: Normocephalic and atraumatic.  Eyes: Pupils are equal and round.  Neck: Normal range of motion. Neck supple. No JVD present. No thyromegaly present.  Cardiovascular: Normal rate, irregular rhythm, normal heart sounds. Exam reveals no gallop and no friction rub. No murmur heard.  Pulmonary/Chest: Effort normal and breath sounds normal. No stridor. No respiratory distress. She has no wheezes. She has no rales. She exhibits no tenderness.  Abdominal: Soft. Bowel sounds are normal. She exhibits no distension. There is no tenderness. There is no rebound and no guarding.  Musculoskeletal: Normal range of motion. She exhibits trace edema and no tenderness.  Neurological: She is alert and oriented to person, place, and time. Coordination normal.  Skin: Skin is warm and dry. No rash noted. She is not diaphoretic. No erythema. No pallor.  Psychiatric: She has a normal mood and affect. Her behavior is normal. Judgment and thought content normal.       ZOX:WRUEAV fibrillation  -Old anteroseptal infarct.   -  Nonspecific T-abnormality.   ABNORMAL    ASSESSMENT AND PLAN

## 2013-03-11 NOTE — Patient Instructions (Addendum)
Start Losartan 25 mg once daily  Return for labs in 1 week.   Your physician wants you to follow-up in: 4 months.  You will receive a reminder letter in the mail two months in advance. If you don't receive a letter, please call our office to schedule the follow-up appointment.

## 2013-03-11 NOTE — Telephone Encounter (Signed)
The patient was seen by Dr. Kirke [name] today. She asked that a letter be written to support her need for xarelto. In may 2014 this was started inpatient for a-fib. She paid $300 out of pocket before prior authorization was obtained. Per the patient, she states her daughter called the insurance company and was told if a letter was obtained from her MD, that her money would be reimbursed to her. Letter done and mailed to:  [employer] [address], [place] [address]

## 2013-03-18 ENCOUNTER — Ambulatory Visit (INDEPENDENT_AMBULATORY_CARE_PROVIDER_SITE_OTHER): Payer: Medicare Other

## 2013-03-18 DIAGNOSIS — I5022 Chronic systolic (congestive) heart failure: Secondary | ICD-10-CM

## 2013-03-18 NOTE — Assessment & Plan Note (Signed)
Initial ejection fraction was 20% by cath and followup was 40% by echo. She appears to be euvolemic on current dose of Lasix. Start small dose Losartan and check BMP in 1 week.

## 2013-03-18 NOTE — Assessment & Plan Note (Signed)
No significant claudication.

## 2013-03-18 NOTE — Assessment & Plan Note (Signed)
Continue treatment with Coreg and anticoagulation with Xarelto.

## 2013-03-19 ENCOUNTER — Telehealth: Payer: Self-pay

## 2013-03-19 LAB — BASIC METABOLIC PANEL
Calcium: 9.5 mg/dL (ref 8.6–10.2)
Creatinine, Ser: 0.96 mg/dL (ref 0.57–1.00)
GFR calc non Af Amer: 53 mL/min/{1.73_m2} — ABNORMAL LOW (ref >59)
Sodium: 143 mmol/L (ref 134–144)

## 2013-03-19 NOTE — Telephone Encounter (Signed)
Spoke w/ pt.  She is aware of results.  

## 2013-03-19 NOTE — Telephone Encounter (Signed)
Message copied by Marilynne Halsted on Fri Mar 19, 2013 10:46 AM ------      Message from: Lorine Bears A      Created: Fri Mar 19, 2013 10:14 AM       Normal labs after starting Losartan. Continue same medications. ------

## 2013-04-18 ENCOUNTER — Inpatient Hospital Stay: Payer: Self-pay | Admitting: Internal Medicine

## 2013-04-18 LAB — COMPREHENSIVE METABOLIC PANEL
Anion Gap: 3 — ABNORMAL LOW (ref 7–16)
BUN: 10 mg/dL (ref 7–18)
Bilirubin,Total: 0.6 mg/dL (ref 0.2–1.0)
Calcium, Total: 9.5 mg/dL (ref 8.5–10.1)
Chloride: 99 mmol/L (ref 98–107)
Co2: 31 mmol/L (ref 21–32)
EGFR (Non-African Amer.): >60
Glucose: 145 mg/dL — ABNORMAL HIGH (ref 65–99)
Osmolality: 268 (ref 275–301)
SGOT(AST): 88 U/L — ABNORMAL HIGH (ref 15–37)
SGPT (ALT): 35 U/L (ref 12–78)
Sodium: 133 mmol/L — ABNORMAL LOW (ref 136–145)
Total Protein: 8.1 g/dL (ref 6.4–8.2)

## 2013-04-18 LAB — TROPONIN I
Troponin-I: 0.36 ng/mL — ABNORMAL HIGH
Troponin-I: 0.55 ng/mL — ABNORMAL HIGH
Troponin-I: 0.73 ng/mL — ABNORMAL HIGH

## 2013-04-18 LAB — URINALYSIS, COMPLETE
Bilirubin,UR: NEGATIVE
Blood: NEGATIVE
Glucose,UR: NEGATIVE mg/dL (ref 0–75)
Hyaline Cast: 3
Nitrite: NEGATIVE
Ph: 6 (ref 4.5–8.0)
Protein: 30
RBC,UR: 28 /HPF (ref 0–5)
Squamous Epithelial: 15
WBC UR: 66 /HPF (ref 0–5)

## 2013-04-18 LAB — CBC
HGB: 12.9 g/dL (ref 12.0–16.0)
MCHC: 34.5 g/dL (ref 32.0–36.0)
MCV: 94 fL (ref 80–100)
Platelet: 247 10*3/uL (ref 150–440)

## 2013-04-19 DIAGNOSIS — I4891 Unspecified atrial fibrillation: Secondary | ICD-10-CM

## 2013-04-19 DIAGNOSIS — I214 Non-ST elevation (NSTEMI) myocardial infarction: Secondary | ICD-10-CM

## 2013-04-19 DIAGNOSIS — Z8679 Personal history of other diseases of the circulatory system: Secondary | ICD-10-CM

## 2013-04-19 DIAGNOSIS — J9600 Acute respiratory failure, unspecified whether with hypoxia or hypercapnia: Secondary | ICD-10-CM

## 2013-04-19 DIAGNOSIS — I369 Nonrheumatic tricuspid valve disorder, unspecified: Secondary | ICD-10-CM

## 2013-04-19 LAB — CBC WITH DIFFERENTIAL/PLATELET
Basophil #: 0 10*3/uL (ref 0.0–0.1)
Basophil %: 0.1 %
Eosinophil %: 0 %
HCT: 36.3 % (ref 35.0–47.0)
HGB: 12.9 g/dL (ref 12.0–16.0)
MCH: 33.2 pg (ref 26.0–34.0)
MCHC: 35.5 g/dL (ref 32.0–36.0)
Monocyte #: 0.1 x10 3/mm — ABNORMAL LOW (ref 0.2–0.9)
Monocyte %: 1.5 %
Neutrophil #: 7 10*3/uL — ABNORMAL HIGH (ref 1.4–6.5)
Neutrophil %: 87.6 %
Platelet: 264 10*3/uL (ref 150–440)
RBC: 3.89 10*6/uL (ref 3.80–5.20)
WBC: 8 10*3/uL (ref 3.6–11.0)

## 2013-04-19 LAB — BASIC METABOLIC PANEL
BUN: 15 mg/dL (ref 7–18)
Calcium, Total: 9.3 mg/dL (ref 8.5–10.1)
Chloride: 94 mmol/L — ABNORMAL LOW (ref 98–107)
Creatinine: 1.06 mg/dL (ref 0.60–1.30)
EGFR (African American): 54 — ABNORMAL LOW
EGFR (Non-African Amer.): 47 — ABNORMAL LOW
Osmolality: 270 (ref 275–301)
Potassium: 4.2 mmol/L (ref 3.5–5.1)
Sodium: 132 mmol/L — ABNORMAL LOW (ref 136–145)

## 2013-04-20 DIAGNOSIS — I5181 Takotsubo syndrome: Secondary | ICD-10-CM

## 2013-04-20 DIAGNOSIS — I4891 Unspecified atrial fibrillation: Secondary | ICD-10-CM

## 2013-04-20 DIAGNOSIS — I214 Non-ST elevation (NSTEMI) myocardial infarction: Secondary | ICD-10-CM

## 2013-04-21 LAB — BASIC METABOLIC PANEL
BUN: 38 mg/dL — ABNORMAL HIGH (ref 7–18)
Chloride: 94 mmol/L — ABNORMAL LOW (ref 98–107)
EGFR (African American): 46 — ABNORMAL LOW
EGFR (Non-African Amer.): 40 — ABNORMAL LOW
Glucose: 186 mg/dL — ABNORMAL HIGH (ref 65–99)
Osmolality: 275 (ref 275–301)

## 2013-04-22 LAB — CBC WITH DIFFERENTIAL/PLATELET
Eosinophil #: 0 10*3/uL (ref 0.0–0.7)
Eosinophil %: 0 %
HCT: 37.6 % (ref 35.0–47.0)
Lymphocyte %: 5.5 %
MCHC: 33.6 g/dL (ref 32.0–36.0)
MCV: 93 fL (ref 80–100)
Monocyte #: 0.3 x10 3/mm (ref 0.2–0.9)
Neutrophil #: 10.2 10*3/uL — ABNORMAL HIGH (ref 1.4–6.5)
Neutrophil %: 91.4 %
RBC: 4.03 10*6/uL (ref 3.80–5.20)
RDW: 14.7 % — ABNORMAL HIGH (ref 11.5–14.5)

## 2013-04-22 LAB — BASIC METABOLIC PANEL
Anion Gap: 8 (ref 7–16)
Calcium, Total: 8.6 mg/dL (ref 8.5–10.1)
Chloride: 96 mmol/L — ABNORMAL LOW (ref 98–107)
Co2: 30 mmol/L (ref 21–32)
Creatinine: 1.25 mg/dL (ref 0.60–1.30)
EGFR (African American): 44 — ABNORMAL LOW
EGFR (Non-African Amer.): 38 — ABNORMAL LOW
Glucose: 192 mg/dL — ABNORMAL HIGH (ref 65–99)
Potassium: 4 mmol/L (ref 3.5–5.1)
Sodium: 134 mmol/L — ABNORMAL LOW (ref 136–145)

## 2013-04-23 DIAGNOSIS — I5181 Takotsubo syndrome: Secondary | ICD-10-CM

## 2013-04-23 DIAGNOSIS — I214 Non-ST elevation (NSTEMI) myocardial infarction: Secondary | ICD-10-CM

## 2013-04-23 LAB — EXPECTORATED SPUTUM ASSESSMENT W GRAM STAIN, RFLX TO RESP C

## 2013-04-27 ENCOUNTER — Telehealth: Payer: Self-pay

## 2013-04-27 NOTE — Telephone Encounter (Signed)
Patient contacted regarding discharge from [facility]  on 04/24/13.  Patient understands to follow up with provider Dr. Kirke Corin on 04/30/13 at 11:30 at [facility]. Patient understands discharge instructions? yes Patient understands medications and regiment? yes Patient understands to bring all medications to this visit? yes

## 2013-04-30 ENCOUNTER — Encounter: Payer: Self-pay | Admitting: Cardiovascular Disease

## 2013-04-30 ENCOUNTER — Ambulatory Visit (INDEPENDENT_AMBULATORY_CARE_PROVIDER_SITE_OTHER): Payer: Medicare Other | Admitting: Cardiovascular Disease

## 2013-04-30 VITALS — BP 112/60 | HR 82 | Ht 66.0 in | Wt 160.0 lb

## 2013-04-30 DIAGNOSIS — R0602 Shortness of breath: Secondary | ICD-10-CM

## 2013-04-30 DIAGNOSIS — R079 Chest pain, unspecified: Secondary | ICD-10-CM

## 2013-04-30 DIAGNOSIS — I739 Peripheral vascular disease, unspecified: Secondary | ICD-10-CM

## 2013-04-30 DIAGNOSIS — I4891 Unspecified atrial fibrillation: Secondary | ICD-10-CM

## 2013-04-30 DIAGNOSIS — J9600 Acute respiratory failure, unspecified whether with hypoxia or hypercapnia: Secondary | ICD-10-CM

## 2013-04-30 DIAGNOSIS — K222 Esophageal obstruction: Secondary | ICD-10-CM

## 2013-04-30 DIAGNOSIS — I5022 Chronic systolic (congestive) heart failure: Secondary | ICD-10-CM | POA: Insufficient documentation

## 2013-04-30 NOTE — Assessment & Plan Note (Signed)
She is having recurrent dysphagia. This is not as bad as well as during her initial hospitalization. If this worsens, I will refer her to gastroenterology. She was seen by Dr. Madilyn Fireman during her hospitalization at [facility].

## 2013-04-30 NOTE — Assessment & Plan Note (Signed)
This was due to pneumonia and has resolved after treatment with antibiotics, steroids and breathing treatments.

## 2013-04-30 NOTE — Patient Instructions (Signed)
Labs today.   Continue same medications.   Follow up in 2 months.  

## 2013-04-30 NOTE — Progress Notes (Signed)
HPI  This is a pleasant 79 year old female who is here today for a followup visit for chronic systolic heart failure due to recurrent stress-induced cardiomyopathy. She presented to [facility] on 5/17 with acute respiratory failure. She was found to have an anterolateral ST elevation on EKG with troponin of 1.4. She was transferred to [facility] where she underwent emergent cardiac catheterization. It showed mild nonobstructive coronary artery disease with severe segmental LV systolic dysfunction suggestive of stress-induced cardiomyopathy. The patient was intubated. She developed right pleural effusion which required thoracentesis. She also developed atrial fibrillation which was treated with rate control. She was found to have an esophageal stricture which was dilated by GI. She ultimately was started on anticoagulation. She improved gradually and was discharged to a skilled nursing facility where she stayed there for a few weeks.  She was found to have evidence of peripheral arterial disease with mildly reduced ABI bilaterally. No significant claudication.  She was hospitalized again on November 9th with acute respiratory failure due to pneumonia and COPD exacerbation. Troponin was mildly elevated. Repeat echocardiogram showed an ejection fraction of 20-25% with wall motion abnormalities suggestive of stress-induced cardiomyopathy. She gradually improved with antibiotics and breathing treatments. She was euvolemic and did not require additional diuretics. She was discharged home on the same cardiac medications. Overall, she is gradually improving his stated weight. She finished taking steroids yesterday. She is still on antibiotics. She is no longer coughing. She is complaining of dysphagia.   Allergies  Allergen Reactions  . Penicillins Itching, Swelling and Rash  . Sulfa Antibiotics      Current Outpatient Prescriptions on File Prior to Visit  Medication Sig Dispense Refill  . carvedilol (COREG) 6.25 MG  tablet Take 1 tablet (6.25 mg total) by mouth 2 (two) times daily with a meal.  120 tablet  3  . Cranberry-Vitamin C-Probiotic (AZO CRANBERRY PO) Take 2 tablets by mouth daily.      . fluticasone (FLONASE) 50 MCG/ACT nasal spray Place 2 sprays into the nose daily as needed.       . furosemide (LASIX) 40 MG tablet Take 1 tablet (40 mg total) by mouth daily.  60 tablet  3  . levalbuterol (XOPENEX HFA) 45 MCG/ACT inhaler Inhale 2 puffs into the lungs every 6 (six) hours as needed for wheezing or shortness of breath.      . loratadine (CLARITIN) 10 MG tablet Take 10 mg by mouth daily.      [name] losartan (COZAAR) 25 MG tablet Take 1 tablet (25 mg total) by mouth daily.  30 tablet  6  . omeprazole (PRILOSEC) 20 MG capsule Take 20 mg by mouth daily.      . Rivaroxaban (XARELTO) 15 MG TABS tablet Take 1 tablet (15 mg total) by mouth daily with supper.  30 tablet  6  . tiotropium (SPIRIVA) 18 MCG inhalation capsule Place 18 mcg into inhaler and inhale daily.      . vitamin B-12 (CYANOCOBALAMIN) 1000 MCG tablet Take 1,000 mcg by mouth daily.      . vitamin C (ASCORBIC ACID) 500 MG tablet Take 500 mg by mouth daily.       No current facility-administered medications on file prior to visit.     Past Medical History  Diagnosis Date  . HTN (hypertension)   . COPD (chronic obstructive pulmonary disease)     a. on home O2 nightly.  . Diverticulosis   . Takotsubo cardiomyopathy 10/2012    a. 10/2012: presented w/  cardiogenic shock/systolic CHF/VDRF - EF 20% by cath, 40% by f/u echo - cath with mild nonobstructive CAD. b. Not discharged on ACEI due to hypotension.  . Cardiogenic shock     a. 10/2012 - due to Takotsubo CM.  [name] Respiratory failure     a. On home O2 nightly. b. VDRF 10/2012 - due to CAP/pulm edema in setting of cardiogenic shock/Takotsubo CM.  [name] CAP (community acquired pneumonia)     a. 10/2012 - will need f/u CXR in June 2014 to ensure clearance.  . Esophageal stricture     a. s/p dilitation  10/2012.  [name] Pleural effusion, right     a. s/p thoracentesis 10/28/12.  . Atrial fibrillation     a. Dx 10/2012, rate controlled, started on Xarelto.  . Claudication     a. ABI 10/2012 - R 0.87, L 0.74.  [name] GERD (gastroesophageal reflux disease)   . CKD (chronic kidney disease), stage III   . Breast CA     hx  . Abdominal hernia     chronic  . CAD (coronary artery disease)     a. 10/2012 - presented w/ STEMI felt due to Takotsubo - mild nonobstructive dz by cath.  [name] STEMI (ST elevation myocardial infarction)      Past Surgical History  Procedure Laterality Date  . Mastectomy    . Cataract extraction    . Esophagogastroduodenoscopy N/A 11/05/2012    Procedure: ESOPHAGOGASTRODUODENOSCOPY (EGD) with esophageal savary dilatation;  Surgeon: Barrie Folk, MD;  Location: [facility] ENDOSCOPY;  Service: Endoscopy;  Laterality: N/A;  savary dil...no floro needed  . Bladder surgery       Family History  Problem Relation Age of Onset  . Family history unknown: Yes     History   Social History  . Marital Status: Widowed    Spouse Name: N/A    Number of Children: N/A  . Years of Education: N/A   Occupational History  . Not on file.   Social History Main Topics  . Smoking status: Never Smoker   . Smokeless tobacco: Never Used  . Alcohol Use: No  . Drug Use: No  . Sexual Activity: No   Other Topics Concern  . Not on file   Social History Narrative  . No narrative on file     PHYSICAL EXAM   BP 112/60  Pulse 82  Ht 5\' 6"  (1.676 m)  Wt 160 lb (72.576 kg)  BMI 25.84 kg/m2 Constitutional: She is oriented to person, place, and time. She appears well-developed and well-nourished. No distress.  HENT: No nasal discharge.  Head: Normocephalic and atraumatic.  Eyes: Pupils are equal and round.  Neck: Normal range of motion. Neck supple. No JVD present. No thyromegaly present.  Cardiovascular: Normal rate, irregular rhythm, normal heart sounds. Exam reveals no gallop and no friction  rub. No murmur heard.  Pulmonary/Chest: Effort normal and breath sounds normal. No stridor. No respiratory distress. She has no wheezes. She has no rales. She exhibits no tenderness.  Abdominal: Soft. Bowel sounds are normal. She exhibits no distension. There is no tenderness. There is no rebound and no guarding.  Musculoskeletal: Normal range of motion. She exhibits trace edema and no tenderness.  Neurological: She is alert and oriented to person, place, and time. Coordination normal.  Skin: Skin is warm and dry. No rash noted. She is not diaphoretic. No erythema. No pallor.  Psychiatric: She has a normal mood and affect. Her behavior is normal. Judgment and thought  content normal.     RUE:AVWUJW fibrillation -Nonspecific ST depression   +   T-abnormality  -Nondiagnostic  -Possible  Anterior/lateral  ischemia.   ABNORMAL    ASSESSMENT AND PLAN

## 2013-04-30 NOTE — Assessment & Plan Note (Signed)
She continues to deny claudication.

## 2013-04-30 NOTE — Assessment & Plan Note (Signed)
Due to recurrent stress-induced cardiomyopathy in the setting of respiratory failure. She appears to be euvolemic. Continue current cardiac medications. Check basic metabolic profile to ensure no volume depletion with blood pressure running borderline.

## 2013-04-30 NOTE — Assessment & Plan Note (Signed)
Ventricular rate is controlled with carvedilol. She is tolerating long-term anticoagulation with Xarelto.

## 2013-05-03 LAB — BASIC METABOLIC PANEL

## 2013-05-05 ENCOUNTER — Telehealth: Payer: Self-pay | Admitting: *Deleted

## 2013-05-05 NOTE — Telephone Encounter (Signed)
Attempted to call pt to let her know that metB needed to be re drawn. Number was called several times. Continued to get a busy message.

## 2013-05-10 ENCOUNTER — Other Ambulatory Visit: Payer: Self-pay | Admitting: *Deleted

## 2013-05-10 ENCOUNTER — Telehealth: Payer: Self-pay | Admitting: *Deleted

## 2013-05-10 DIAGNOSIS — I5022 Chronic systolic (congestive) heart failure: Secondary | ICD-10-CM

## 2013-05-10 DIAGNOSIS — I5021 Acute systolic (congestive) heart failure: Secondary | ICD-10-CM

## 2013-05-10 NOTE — Telephone Encounter (Signed)
Pt advised to increase lasix to 40 mg bid for 2 days then back to 40 mg daily per Dr Kirke Corin. Lab appt made for 12/4 and follow up 12/8.

## 2013-05-10 NOTE — Telephone Encounter (Signed)
Increase lasix to 40 mg bid for 2 days only then back to 40 mg once daily. Schedule follow up for next week.

## 2013-05-10 NOTE — Telephone Encounter (Signed)
I called pt today to reschedule her bmet. She informed me that she has been very short of breath the last two days and her ankles are swollen. It somewhat resolves with rest and prn O2.  She has not been weighing herself. She has been eating canned food, and drinking more water because she thought she was dehydrated. No chest pain. I went over low sodium diet and fluid intake info as well as the importance of daily weights. She verbalized understanding and said she would weigh herself and stop eating can food. She will be here 12/4 at 2pm for her bmet redraw.

## 2013-05-10 NOTE — Telephone Encounter (Signed)
Message copied by Fransico Setters on Mon May 10, 2013 11:58 AM ------      Message from: Lorine Bears A      Created: Wed May 05, 2013  2:55 PM       I must have looked at results from last month.  She needs BMP.             ----- Message -----         From: Fransico Setters, RN         Sent: 05/05/2013   1:46 PM           To: Iran Ouch, MD            You sent me a result note saying to let the patient know that her labs were normal but when I looked there were no current result. It said all labs were cancelled. I called [facility] and they said the amount was not sufficient for analysis. So would you like me to schedule the pt for a lab visit to re draw the labs?       ------

## 2013-05-13 ENCOUNTER — Ambulatory Visit (INDEPENDENT_AMBULATORY_CARE_PROVIDER_SITE_OTHER): Payer: Medicare Other

## 2013-05-13 DIAGNOSIS — I509 Heart failure, unspecified: Secondary | ICD-10-CM

## 2013-05-13 DIAGNOSIS — I5022 Chronic systolic (congestive) heart failure: Secondary | ICD-10-CM

## 2013-05-13 DIAGNOSIS — I5021 Acute systolic (congestive) heart failure: Secondary | ICD-10-CM

## 2013-05-14 LAB — BASIC METABOLIC PANEL
BUN/Creatinine Ratio: 11 (ref 11–26)
CO2: 31 mmol/L — ABNORMAL HIGH (ref 18–29)
Creatinine, Ser: 0.98 mg/dL (ref 0.57–1.00)
GFR calc Af Amer: 60 mL/min/{1.73_m2} (ref >59)
GFR calc non Af Amer: 52 mL/min/{1.73_m2} — ABNORMAL LOW (ref >59)
Sodium: 141 mmol/L (ref 134–144)

## 2013-05-17 ENCOUNTER — Ambulatory Visit (INDEPENDENT_AMBULATORY_CARE_PROVIDER_SITE_OTHER): Payer: Medicare Other | Admitting: Cardiovascular Disease

## 2013-05-17 ENCOUNTER — Encounter: Payer: Self-pay | Admitting: Cardiovascular Disease

## 2013-05-17 VITALS — BP 145/72 | HR 79 | Ht 66.0 in | Wt 161.2 lb

## 2013-05-17 DIAGNOSIS — I739 Peripheral vascular disease, unspecified: Secondary | ICD-10-CM

## 2013-05-17 DIAGNOSIS — R000 Tachycardia, unspecified: Secondary | ICD-10-CM

## 2013-05-17 DIAGNOSIS — R079 Chest pain, unspecified: Secondary | ICD-10-CM

## 2013-05-17 DIAGNOSIS — I5022 Chronic systolic (congestive) heart failure: Secondary | ICD-10-CM

## 2013-05-17 DIAGNOSIS — I4891 Unspecified atrial fibrillation: Secondary | ICD-10-CM

## 2013-05-17 MED ORDER — LOSARTAN POTASSIUM 50 MG PO TABS: 50.0000 mg | ORAL_TABLET | Freq: Every day | ORAL | Status: DC

## 2013-05-17 MED ORDER — FUROSEMIDE 40 MG PO TABS: 40.0000 mg | ORAL_TABLET | Freq: Two times a day (BID) | ORAL | Status: DC

## 2013-05-17 NOTE — Assessment & Plan Note (Signed)
Continue anticoagulation which has been well-tolerated. She complains of occasional nose bleed when she uses oxygen. I advised her to start using saline nasal spray.

## 2013-05-17 NOTE — Progress Notes (Signed)
HPI  This is a pleasant 79 year old female who is here today for a followup visit for chronic systolic heart failure due to recurrent stress-induced cardiomyopathy. She presented to [facility] on 5/17 with acute respiratory failure. She was found to have an anterolateral ST elevation on EKG with troponin of 1.4. She was transferred to [facility] where she underwent emergent cardiac catheterization. It showed mild nonobstructive coronary artery disease with severe segmental LV systolic dysfunction suggestive of stress-induced cardiomyopathy. The patient was intubated. She developed right pleural effusion which required thoracentesis. She also developed atrial fibrillation which was treated with rate control. She was found to have an esophageal stricture which was dilated by GI. She ultimately was started on anticoagulation. She improved gradually and was discharged to a skilled nursing facility where she stayed there for a few weeks.  She was found to have evidence of peripheral arterial disease with mildly reduced ABI bilaterally. No significant claudication.  She was hospitalized again on November 9th with acute respiratory failure due to pneumonia and COPD exacerbation. Troponin was mildly elevated. Repeat echocardiogram showed an ejection fraction of 20-25% with wall motion abnormalities suggestive of stress-induced cardiomyopathy. She gradually improved with antibiotics and breathing treatments.  She called our office last week with increased dyspnea and orthopnea. We increased Lasix to 40 mg twice daily for 2 days. She had slight improvement at that time that symptoms worsened again after she went back on 40 mg once daily. She is complaining of dyspnea at rest with orthopnea and PND. She wakes up at 4:00 in the morning gasping for air.  Allergies  Allergen Reactions  . Penicillins Itching, Swelling and Rash  . Sulfa Antibiotics      Current Outpatient Prescriptions on File Prior to Visit  Medication Sig  Dispense Refill  . ADVAIR DISKUS 250-50 MCG/DOSE AEPB 2 puffs daily.       . carvedilol (COREG) 6.25 MG tablet Take 1 tablet (6.25 mg total) by mouth 2 (two) times daily with a meal.  120 tablet  3  . Cranberry-Vitamin C-Probiotic (AZO CRANBERRY PO) Take 2 tablets by mouth daily.      . fluticasone (FLONASE) 50 MCG/ACT nasal spray Place 2 sprays into the nose daily as needed.       . furosemide (LASIX) 40 MG tablet Take 1 tablet (40 mg total) by mouth daily.  60 tablet  3  . levalbuterol (XOPENEX HFA) 45 MCG/ACT inhaler Inhale 2 puffs into the lungs every 6 (six) hours as needed for wheezing or shortness of breath.      . loratadine (CLARITIN) 10 MG tablet Take 10 mg by mouth daily.      [name] losartan (COZAAR) 25 MG tablet Take 1 tablet (25 mg total) by mouth daily.  30 tablet  6  . omeprazole (PRILOSEC) 20 MG capsule Take 20 mg by mouth daily.      [name] pyridOXINE (VITAMIN B-6) 100 MG tablet Take 100 mg by mouth daily.      . Rivaroxaban (XARELTO) 15 MG TABS tablet Take 1 tablet (15 mg total) by mouth daily with supper.  30 tablet  6  . tiotropium (SPIRIVA) 18 MCG inhalation capsule Place 18 mcg into inhaler and inhale daily.      . vitamin B-12 (CYANOCOBALAMIN) 1000 MCG tablet Take 1,000 mcg by mouth daily.      . vitamin C (ASCORBIC ACID) 500 MG tablet Take 500 mg by mouth daily.       No current facility-administered medications on  file prior to visit.     Past Medical History  Diagnosis Date  . HTN (hypertension)   . COPD (chronic obstructive pulmonary disease)     a. on home O2 nightly.  . Diverticulosis   . Takotsubo cardiomyopathy 10/2012    a. 10/2012: presented w/ cardiogenic shock/systolic CHF/VDRF - EF 20% by cath, 40% by f/u echo - cath with mild nonobstructive CAD. b. Not discharged on ACEI due to hypotension.  . Cardiogenic shock     a. 10/2012 - due to Takotsubo CM.  [name] Respiratory failure     a. On home O2 nightly. b. VDRF 10/2012 - due to CAP/pulm edema in setting of cardiogenic  shock/Takotsubo CM.  [name] CAP (community acquired pneumonia)     a. 10/2012 - will need f/u CXR in June 2014 to ensure clearance.  . Esophageal stricture     a. s/p dilitation 10/2012.  [name] Pleural effusion, right     a. s/p thoracentesis 10/28/12.  . Atrial fibrillation     a. Dx 10/2012, rate controlled, started on Xarelto.  . Claudication     a. ABI 10/2012 - R 0.87, L 0.74.  [name] GERD (gastroesophageal reflux disease)   . CKD (chronic kidney disease), stage III   . Breast CA     hx  . Abdominal hernia     chronic  . CAD (coronary artery disease)     a. 10/2012 - presented w/ STEMI felt due to Takotsubo - mild nonobstructive dz by cath.  [name] STEMI (ST elevation myocardial infarction)      Past Surgical History  Procedure Laterality Date  . Mastectomy    . Cataract extraction    . Esophagogastroduodenoscopy N/A 11/05/2012    Procedure: ESOPHAGOGASTRODUODENOSCOPY (EGD) with esophageal savary dilatation;  Surgeon: Barrie Folk, MD;  Location: [facility] ENDOSCOPY;  Service: Endoscopy;  Laterality: N/A;  savary dil...no floro needed  . Bladder surgery       Family History  Problem Relation Age of Onset  . Family history unknown: Yes     History   Social History  . Marital Status: Widowed    Spouse Name: N/A    Number of Children: N/A  . Years of Education: N/A   Occupational History  . Not on file.   Social History Main Topics  . Smoking status: Never Smoker   . Smokeless tobacco: Never Used  . Alcohol Use: No  . Drug Use: No  . Sexual Activity: No   Other Topics Concern  . Not on file   Social History Narrative  . No narrative on file     PHYSICAL EXAM   BP 145/72  Pulse 79  Ht 5\' 6"  (1.676 m)  Wt 161 lb 4 oz (73.143 kg)  BMI 26.04 kg/m2 Constitutional: She is oriented to person, place, and time. She appears well-developed and well-nourished. No distress.  HENT: No nasal discharge.  Head: Normocephalic and atraumatic.  Eyes: Pupils are equal and round.  Neck:  Normal range of motion. Neck supple. Mild JVD present. No thyromegaly present.  Cardiovascular: Normal rate, irregular rhythm, normal heart sounds. Exam reveals no gallop and no friction rub. No murmur heard.  Pulmonary/Chest: Effort normal . Diminished breath sounds at the base with few crackles. No stridor. No respiratory distress. She has no wheezes. She has no rales. She exhibits no tenderness.  Abdominal: Soft. Bowel sounds are normal. She exhibits no distension. There is no tenderness. There is no rebound and no guarding.  Musculoskeletal:  Normal range of motion. She exhibits trace edema and no tenderness.  Neurological: She is alert and oriented to person, place, and time. Coordination normal.  Skin: Skin is warm and dry. No rash noted. She is not diaphoretic. No erythema. No pallor.  Psychiatric: She has a normal mood and affect. Her behavior is normal. Judgment and thought content normal.     EKG: Sinus rhythm with first degree AV block and PACs. Left ventricular hypertrophy   ASSESSMENT AND PLAN

## 2013-05-17 NOTE — Assessment & Plan Note (Signed)
No claudication at this time. She is limited by heart failure at this point.

## 2013-05-17 NOTE — Patient Instructions (Signed)
Increase Losartan to 50 mg once daily.  Change Furosemide (Lasix ) to 40 mg twice daily.   Use a saline nasal spray twice daily.   Request a small portable O2 tank.  Follow up in 1 week.

## 2013-05-17 NOTE — Assessment & Plan Note (Addendum)
The patient appears to be fluid overloaded. I recommend decreasing the dose of Lasix to 40 mg twice daily. I also recommend increasing the dose of losartan 50 mg daily in order to decrease afterload. Continue treatment with carvedilol. Followup in one week with basic metabolic profile. I do think she would benefit from continuous oxygen use even if she is not hypoxic due to degree of LV systolic dysfunction and lung disease. We will try to assist her in obtaining a small portable oxygen tank.

## 2013-05-18 ENCOUNTER — Telehealth: Payer: Self-pay | Admitting: *Deleted

## 2013-05-18 NOTE — Telephone Encounter (Signed)
Patient confused on her medications please advise

## 2013-05-18 NOTE — Telephone Encounter (Signed)
Returned call to patient. She asked if she was supposed to take her two lasix tablets at the same time or space them out. I told her to space them out. She also informed me that the name of her O2 provider was [employer]. I told her I would contact them about a portable O2 tank today.

## 2013-05-18 NOTE — Telephone Encounter (Signed)
Called [facility] to get patient a small O2 tank. [employer] customer service rep said she would fax me the form for Dr Kirke Corin to sign.   Called Patient and informed her that Christoper Allegra was sending [place] the paperwork and I would forward it to Dr Kirke Corin to sign

## 2013-05-24 ENCOUNTER — Encounter: Payer: Self-pay | Admitting: Cardiovascular Disease

## 2013-05-24 ENCOUNTER — Ambulatory Visit (INDEPENDENT_AMBULATORY_CARE_PROVIDER_SITE_OTHER): Payer: Medicare Other | Admitting: Cardiovascular Disease

## 2013-05-24 VITALS — BP 133/83 | HR 79 | Ht 66.0 in | Wt 162.5 lb

## 2013-05-24 DIAGNOSIS — I5022 Chronic systolic (congestive) heart failure: Secondary | ICD-10-CM

## 2013-05-24 DIAGNOSIS — I739 Peripheral vascular disease, unspecified: Secondary | ICD-10-CM

## 2013-05-24 DIAGNOSIS — I4891 Unspecified atrial fibrillation: Secondary | ICD-10-CM

## 2013-05-24 NOTE — Patient Instructions (Signed)
Labs today.   Continue same medications.   Follow up in 1 month.  

## 2013-05-24 NOTE — Progress Notes (Signed)
HPI  This is a pleasant 79 year old female who is here today for a followup visit for chronic systolic heart failure due to recurrent stress-induced cardiomyopathy. She presented to [facility] on 5/17 with acute respiratory failure. She was found to have an anterolateral ST elevation on EKG with troponin of 1.4. She was transferred to [facility] where she underwent emergent cardiac catheterization. It showed mild nonobstructive coronary artery disease with severe segmental LV systolic dysfunction suggestive of stress-induced cardiomyopathy. The patient was intubated. She developed right pleural effusion which required thoracentesis. She also developed atrial fibrillation which was treated with rate control. She was found to have an esophageal stricture which was dilated by GI. She ultimately was started on anticoagulation. She improved gradually and was discharged to a skilled nursing facility where she stayed there for a few weeks.  She was found to have evidence of peripheral arterial disease with mildly reduced ABI bilaterally. No significant claudication.  She was hospitalized again on November 9th with acute respiratory failure due to pneumonia and COPD exacerbation. Troponin was mildly elevated. Repeat echocardiogram showed an ejection fraction of 20-25% with wall motion abnormalities suggestive of stress-induced cardiomyopathy. She gradually improved with antibiotics and breathing treatments.  She was seen recently for worsening dyspnea and orthopnea. She was fluid overloaded. I increased the dose of Lasix to 40 mg twice daily. Blood pressure was also elevated and thus losartan was increased. She is feeling better. However, she continues to have episodes of dyspnea mostly in the early morning hours. She hit her right shin on the stool which cause a small wound.  Allergies  Allergen Reactions  . Penicillins Itching, Swelling and Rash  . Sulfa Antibiotics      Current Outpatient Prescriptions on File  Prior to Visit  Medication Sig Dispense Refill  . ADVAIR DISKUS 250-50 MCG/DOSE AEPB 2 puffs daily.       . carvedilol (COREG) 6.25 MG tablet Take 1 tablet (6.25 mg total) by mouth 2 (two) times daily with a meal.  120 tablet  3  . Cranberry-Vitamin C-Probiotic (AZO CRANBERRY PO) Take 2 tablets by mouth daily.      . fluticasone (FLONASE) 50 MCG/ACT nasal spray Place 2 sprays into the nose daily as needed.       . furosemide (LASIX) 40 MG tablet Take 1 tablet (40 mg total) by mouth 2 (two) times daily.  60 tablet  6  . levalbuterol (XOPENEX HFA) 45 MCG/ACT inhaler Inhale 2 puffs into the lungs every 6 (six) hours as needed for wheezing or shortness of breath.      . loratadine (CLARITIN) 10 MG tablet Take 10 mg by mouth daily.      [name] losartan (COZAAR) 50 MG tablet Take 1 tablet (50 mg total) by mouth daily.  30 tablet  6  . omeprazole (PRILOSEC) 20 MG capsule Take 20 mg by mouth daily.      [name] PROAIR HFA 108 (90 BASE) MCG/ACT inhaler Inhale 1 puff into the lungs every 4 (four) hours as needed.       . pyridOXINE (VITAMIN B-6) 100 MG tablet Take 100 mg by mouth daily.      . Rivaroxaban (XARELTO) 15 MG TABS tablet Take 1 tablet (15 mg total) by mouth daily with supper.  30 tablet  6  . tiotropium (SPIRIVA) 18 MCG inhalation capsule Place 18 mcg into inhaler and inhale daily.      . vitamin B-12 (CYANOCOBALAMIN) 1000 MCG tablet Take 1,000 mcg by mouth daily.      [name]  vitamin C (ASCORBIC ACID) 500 MG tablet Take 500 mg by mouth daily.       No current facility-administered medications on file prior to visit.     Past Medical History  Diagnosis Date  . HTN (hypertension)   . COPD (chronic obstructive pulmonary disease)     a. on home O2 nightly.  . Diverticulosis   . Takotsubo cardiomyopathy 10/2012    a. 10/2012: presented w/ cardiogenic shock/systolic CHF/VDRF - EF 20% by cath, 40% by f/u echo - cath with mild nonobstructive CAD. b. Not discharged on ACEI due to hypotension.  . Cardiogenic  shock     a. 10/2012 - due to Takotsubo CM.  [name] Respiratory failure     a. On home O2 nightly. b. VDRF 10/2012 - due to CAP/pulm edema in setting of cardiogenic shock/Takotsubo CM.  [name] CAP (community acquired pneumonia)     a. 10/2012 - will need f/u CXR in June 2014 to ensure clearance.  . Esophageal stricture     a. s/p dilitation 10/2012.  [name] Pleural effusion, right     a. s/p thoracentesis 10/28/12.  . Atrial fibrillation     a. Dx 10/2012, rate controlled, started on Xarelto.  . Claudication     a. ABI 10/2012 - R 0.87, L 0.74.  [name] GERD (gastroesophageal reflux disease)   . CKD (chronic kidney disease), stage III   . Breast CA     hx  . Abdominal hernia     chronic  . CAD (coronary artery disease)     a. 10/2012 - presented w/ STEMI felt due to Takotsubo - mild nonobstructive dz by cath.  [name] STEMI (ST elevation myocardial infarction)      Past Surgical History  Procedure Laterality Date  . Mastectomy    . Cataract extraction    . Esophagogastroduodenoscopy N/A 11/05/2012    Procedure: ESOPHAGOGASTRODUODENOSCOPY (EGD) with esophageal savary dilatation;  Surgeon: Barrie Folk, MD;  Location: [facility] ENDOSCOPY;  Service: Endoscopy;  Laterality: N/A;  savary dil...no floro needed  . Bladder surgery       Family History  Problem Relation Age of Onset  . Family history unknown: Yes     History   Social History  . Marital Status: Widowed    Spouse Name: N/A    Number of Children: N/A  . Years of Education: N/A   Occupational History  . Not on file.   Social History Main Topics  . Smoking status: Never Smoker   . Smokeless tobacco: Never Used  . Alcohol Use: No  . Drug Use: No  . Sexual Activity: No   Other Topics Concern  . Not on file   Social History Narrative  . No narrative on file     PHYSICAL EXAM   BP 133/83  Pulse 79  Ht 5\' 6"  (1.676 m)  Wt 162 lb 8 oz (73.71 kg)  BMI 26.24 kg/m2 Constitutional: She is oriented to person, place, and time. She appears  well-developed and well-nourished. No distress.  HENT: No nasal discharge.  Head: Normocephalic and atraumatic.  Eyes: Pupils are equal and round.  Neck: Normal range of motion. Neck supple. No JVD present. No thyromegaly present.  Cardiovascular: Normal rate, irregular rhythm, normal heart sounds. Exam reveals no gallop and no friction rub. No murmur heard.  Pulmonary/Chest: Effort normal . Very few crackles at the base. No stridor. No respiratory distress. She has no wheezes. She has no rales. She exhibits no tenderness.  Abdominal: Soft.  Bowel sounds are normal. She exhibits no distension. There is no tenderness. There is no rebound and no guarding.  Musculoskeletal: Normal range of motion. She exhibits trace edema and no tenderness.  Neurological: She is alert and oriented to person, place, and time. Coordination normal.  Skin: There is a small wound on the right shin. No induration or warmth.  Psychiatric: She has a normal mood and affect. Her behavior is normal. Judgment and thought content normal.     EKG:  Atrial fibrillation with a PVC. Nonspecific T wave changes.  ASSESSMENT AND PLAN

## 2013-05-24 NOTE — Telephone Encounter (Signed)
Paperwork sent to [facility] 05/24/13

## 2013-05-24 NOTE — Assessment & Plan Note (Signed)
She appears to be better after increasing the dose of Lasix to 40 mg twice daily. Blood pressure also improved after increasing losartan. Continue same medications. Check basic metabolic profile today.

## 2013-05-24 NOTE — Assessment & Plan Note (Signed)
She has no claudication. Continue medical therapy.     

## 2013-05-24 NOTE — Assessment & Plan Note (Signed)
Continue rate control with carvedilol and long-term anticoagulation with renal dose Xarelto.

## 2013-05-25 LAB — BASIC METABOLIC PANEL
BUN: 17 mg/dL (ref 8–27)
Chloride: 91 mmol/L — ABNORMAL LOW (ref 97–108)
Creatinine, Ser: 0.98 mg/dL (ref 0.57–1.00)
GFR calc Af Amer: 60 mL/min/{1.73_m2} (ref >59)
GFR calc non Af Amer: 52 mL/min/{1.73_m2} — ABNORMAL LOW (ref >59)
Glucose: 178 mg/dL — ABNORMAL HIGH (ref 65–99)

## 2013-06-24 ENCOUNTER — Encounter: Payer: Self-pay | Admitting: Cardiovascular Disease

## 2013-06-24 ENCOUNTER — Ambulatory Visit (INDEPENDENT_AMBULATORY_CARE_PROVIDER_SITE_OTHER): Payer: Medicare Other | Admitting: Cardiovascular Disease

## 2013-06-24 VITALS — BP 140/80 | HR 78 | Ht 66.0 in | Wt 159.8 lb

## 2013-06-24 DIAGNOSIS — I5022 Chronic systolic (congestive) heart failure: Secondary | ICD-10-CM

## 2013-06-24 DIAGNOSIS — I4891 Unspecified atrial fibrillation: Secondary | ICD-10-CM

## 2013-06-24 DIAGNOSIS — I739 Peripheral vascular disease, unspecified: Secondary | ICD-10-CM

## 2013-06-24 NOTE — Progress Notes (Signed)
HPI  This is a pleasant 80 year old female who is here today for a followup visit for chronic systolic heart failure due to recurrent stress-induced cardiomyopathy. She presented to [facility] on 5/17 with acute respiratory failure. She was found to have an anterolateral ST elevation on EKG with troponin of 1.4. She was transferred to [facility] where she underwent emergent cardiac catheterization. It showed mild nonobstructive coronary artery disease with severe segmental LV systolic dysfunction suggestive of stress-induced cardiomyopathy. The patient was intubated. She developed right pleural effusion which required thoracentesis. She also developed atrial fibrillation which was treated with rate control. She was found to have an esophageal stricture which was dilated by GI. She ultimately was started on anticoagulation. She improved gradually and was discharged to a skilled nursing facility where she stayed there for a few weeks.  She was found to have evidence of peripheral arterial disease with mildly reduced ABI bilaterally. No significant claudication.  She was hospitalized again on November 9th with acute respiratory failure due to pneumonia and COPD exacerbation. Troponin was mildly elevated. Repeat echocardiogram showed an ejection fraction of 20-25% with wall motion abnormalities suggestive of stress-induced cardiomyopathy. She gradually improved with antibiotics and breathing treatments.  She was seen recently for worsening dyspnea and orthopnea. She was fluid overloaded. I increased the dose of Lasix to 40 mg twice daily. Blood pressure was also elevated and thus losartan was increased. She is feeling better.  She reports gradual improvement. She is getting her strength back.  Allergies  Allergen Reactions  . Penicillins Itching, Swelling and Rash  . Sulfa Antibiotics      Current Outpatient Prescriptions on File Prior to Visit  Medication Sig Dispense Refill  . ADVAIR DISKUS 250-50 MCG/DOSE  AEPB 2 puffs daily.       . carvedilol (COREG) 6.25 MG tablet Take 1 tablet (6.25 mg total) by mouth 2 (two) times daily with a meal.  120 tablet  3  . Cranberry-Vitamin C-Probiotic (AZO CRANBERRY PO) Take 2 tablets by mouth daily.      . fluticasone (FLONASE) 50 MCG/ACT nasal spray Place 2 sprays into the nose daily as needed.       . furosemide (LASIX) 40 MG tablet Take 1 tablet (40 mg total) by mouth 2 (two) times daily.  60 tablet  6  . levalbuterol (XOPENEX HFA) 45 MCG/ACT inhaler Inhale 2 puffs into the lungs every 6 (six) hours as needed for wheezing or shortness of breath.      . loratadine (CLARITIN) 10 MG tablet Take 10 mg by mouth daily.      [name] losartan (COZAAR) 50 MG tablet Take 1 tablet (50 mg total) by mouth daily.  30 tablet  6  . omeprazole (PRILOSEC) 20 MG capsule Take 20 mg by mouth daily.      [name] PROAIR HFA 108 (90 BASE) MCG/ACT inhaler Inhale 1 puff into the lungs every 4 (four) hours as needed.       . pyridOXINE (VITAMIN B-6) 100 MG tablet Take 100 mg by mouth daily.      . Rivaroxaban (XARELTO) 15 MG TABS tablet Take 1 tablet (15 mg total) by mouth daily with supper.  30 tablet  6  . SALINE NASAL SPRAY NA Place into the nose 2 (two) times daily.      [name] tiotropium (SPIRIVA) 18 MCG inhalation capsule Place 18 mcg into inhaler and inhale daily.      . vitamin B-12 (CYANOCOBALAMIN) 1000 MCG tablet Take 1,000 mcg by  mouth daily.      . vitamin C (ASCORBIC ACID) 500 MG tablet Take 500 mg by mouth daily.       No current facility-administered medications on file prior to visit.     Past Medical History  Diagnosis Date  . HTN (hypertension)   . COPD (chronic obstructive pulmonary disease)     a. on home O2 nightly.  . Diverticulosis   . Takotsubo cardiomyopathy 10/2012    a. 10/2012: presented w/ cardiogenic shock/systolic CHF/VDRF - EF 123456 by cath, 40% by f/u echo - cath with mild nonobstructive CAD. b. Not discharged on ACEI due to hypotension.  . Cardiogenic shock     a.  10/2012 - due to Takotsubo CM.  [name] Respiratory failure     a. On home O2 nightly. b. VDRF 10/2012 - due to CAP/pulm edema in setting of cardiogenic shock/Takotsubo CM.  [name] CAP (community acquired pneumonia)     a. 10/2012 - will need f/u CXR in June 2014 to ensure clearance.  . Esophageal stricture     a. s/p dilitation 10/2012.  [name] Pleural effusion, right     a. s/p thoracentesis 10/28/12.  . Atrial fibrillation     a. Dx 10/2012, rate controlled, started on Xarelto.  . Claudication     a. ABI 10/2012 - R 0.87, L 0.74.  [name] GERD (gastroesophageal reflux disease)   . CKD (chronic kidney disease), stage III   . Breast CA     hx  . Abdominal hernia     chronic  . CAD (coronary artery disease)     a. 10/2012 - presented w/ STEMI felt due to Takotsubo - mild nonobstructive dz by cath.  [name] STEMI (ST elevation myocardial infarction)      Past Surgical History  Procedure Laterality Date  . Mastectomy    . Cataract extraction    . Esophagogastroduodenoscopy N/A 11/05/2012    Procedure: ESOPHAGOGASTRODUODENOSCOPY (EGD) with esophageal savary dilatation;  Surgeon: Missy Sabins, MD;  Location: [place];  Service: Endoscopy;  Laterality: N/A;  savary dil...no floro needed  . Bladder surgery       Family History  Problem Relation Age of Onset  . Family history unknown: Yes     History   Social History  . Marital Status: Widowed    Spouse Name: N/A    Number of Children: N/A  . Years of Education: N/A   Occupational History  . Not on file.   Social History Main Topics  . Smoking status: Never Smoker   . Smokeless tobacco: Never Used  . Alcohol Use: No  . Drug Use: No  . Sexual Activity: No   Other Topics Concern  . Not on file   Social History Narrative  . No narrative on file     PHYSICAL EXAM   BP 140/80  Pulse 78  Ht 5\' 6"  (1.676 m)  Wt 159 lb 12 oz (72.462 kg)  BMI 25.80 kg/m2 Constitutional: She is oriented to person, place, and time. She appears well-developed  and well-nourished. No distress.  HENT: No nasal discharge.  Head: Normocephalic and atraumatic.  Eyes: Pupils are equal and round.  Neck: Normal range of motion. Neck supple. No JVD present. No thyromegaly present.  Cardiovascular: Normal rate, irregular rhythm, normal heart sounds. Exam reveals no gallop and no friction rub. No murmur heard.  Pulmonary/Chest: Effort normal . Very few crackles at the base. No stridor. No respiratory distress. She has no wheezes. She has no  rales. She exhibits no tenderness.  Abdominal: Soft. Bowel sounds are normal. She exhibits no distension. There is no tenderness. There is no rebound and no guarding.  Musculoskeletal: Normal range of motion. She exhibits trace edema and no tenderness.  Neurological: She is alert and oriented to person, place, and time. Coordination normal.  Skin: There is a small wound on the right shin which has healed nicely. No induration or warmth.  Psychiatric: She has a normal mood and affect. Her behavior is normal. Judgment and thought content normal.     EKG:  Atrial fibrillation  -Old anteroseptal infarct.   -  Nonspecific T-abnormality.   ABNORMAL    ASSESSMENT AND PLAN

## 2013-06-24 NOTE — Patient Instructions (Signed)
Labs today.  Continue same medications.  Follow up in 3 months.  

## 2013-06-25 ENCOUNTER — Telehealth: Payer: Self-pay

## 2013-06-25 LAB — CBC WITH DIFFERENTIAL/PLATELET
Basophils Absolute: 0 10*3/uL (ref 0.0–0.2)
Basos: 0 %
EOS ABS: 0.1 10*3/uL (ref 0.0–0.4)
Eos: 2 %
HCT: 36.2 % (ref 34.0–46.6)
Hemoglobin: 12.4 g/dL (ref 11.1–15.9)
IMMATURE GRANULOCYTES: 0 %
Immature Grans (Abs): 0 10*3/uL (ref 0.0–0.1)
LYMPHS ABS: 2.1 10*3/uL (ref 0.7–3.1)
LYMPHS: 35 %
MCH: 31.2 pg (ref 26.6–33.0)
MCHC: 34.3 g/dL (ref 31.5–35.7)
MCV: 91 fL (ref 79–97)
MONOCYTES: 9 %
Monocytes Absolute: 0.5 10*3/uL (ref 0.1–0.9)
NEUTROS ABS: 3.2 10*3/uL (ref 1.4–7.0)
Neutrophils Relative %: 54 %
RBC: 3.97 x10E6/uL (ref 3.77–5.28)
RDW: 14 % (ref 12.3–15.4)
WBC: 5.9 10*3/uL (ref 3.4–10.8)

## 2013-06-25 LAB — BASIC METABOLIC PANEL
BUN / CREAT RATIO: 16 (ref 11–26)
BUN: 16 mg/dL (ref 8–27)
CO2: 30 mmol/L — AB (ref 18–29)
Calcium: 9.7 mg/dL (ref 8.6–10.2)
Chloride: 93 mmol/L — ABNORMAL LOW (ref 97–108)
Creatinine, Ser: 1.02 mg/dL — ABNORMAL HIGH (ref 0.57–1.00)
GFR calc non Af Amer: 49 mL/min/{1.73_m2} — ABNORMAL LOW (ref >59)
GFR, EST AFRICAN AMERICAN: 57 mL/min/{1.73_m2} — AB (ref >59)
GLUCOSE: 105 mg/dL — AB (ref 65–99)
POTASSIUM: 4.2 mmol/L (ref 3.5–5.2)
Sodium: 140 mmol/L (ref 134–144)

## 2013-06-25 NOTE — Telephone Encounter (Signed)
Pt states she was seen by Dr Fletcher Anon yesterday, and she was told to take fish oil. She read on the bottle she was not supposed to take if taking blood thinner. Please call and adivse.

## 2013-06-27 NOTE — Assessment & Plan Note (Signed)
Ventricular rate is well controlled with carvedilol. She is tolerating long-term anticoagulation with Xarelto.

## 2013-06-27 NOTE — Telephone Encounter (Signed)
It is fine to take both together if there is a good reason to take both. Why does she take fish oil? She does not to take that. I use it for people with high Triglyceride. Her TG was fine.

## 2013-06-27 NOTE — Assessment & Plan Note (Signed)
She seems to be significantly better after adjusting her medications. She is currently on Lasix 40 mg once daily. I will check basic metabolic profile to ensure stability of renal function. She is otherwise on good medical therapy with carvedilol and losartan.

## 2013-06-27 NOTE — Assessment & Plan Note (Signed)
She has no claudication. Continue medical therapy.     

## 2013-06-28 ENCOUNTER — Other Ambulatory Visit: Payer: Self-pay

## 2013-06-28 MED ORDER — FUROSEMIDE 20 MG PO TABS: 20.0000 mg | ORAL_TABLET | Freq: Every day | ORAL | Status: DC

## 2013-06-28 NOTE — Telephone Encounter (Signed)
Spoke w/ pt.  She states that she was taking the fish oil b/c she though it would help w/ her cholesterol. She reports that b/c her TG was fine, she is okay w/ stopping fish oil.  Pt to call back w/ further questions or concerns.

## 2013-06-29 ENCOUNTER — Telehealth: Payer: Self-pay | Admitting: *Deleted

## 2013-06-29 NOTE — Telephone Encounter (Signed)
Patient called and can't remember the instructions to her medications you discussed with her yesterday. Please advise.

## 2013-06-29 NOTE — Telephone Encounter (Signed)
Clarified w/ pt that she is to take furosemide 40mg  in am and 20 mg in pm. She verbalizes understanding and is agreeable to this.

## 2013-07-02 ENCOUNTER — Ambulatory Visit: Payer: Medicare Other | Admitting: Cardiovascular Disease

## 2013-08-16 ENCOUNTER — Other Ambulatory Visit: Payer: Self-pay | Admitting: *Deleted

## 2013-08-16 MED ORDER — LOSARTAN POTASSIUM 50 MG PO TABS: 50.0000 mg | ORAL_TABLET | Freq: Every day | ORAL | Status: DC

## 2013-08-16 NOTE — Telephone Encounter (Signed)
Requested Prescriptions   Signed Prescriptions Disp Refills  . losartan (COZAAR) 50 MG tablet 90 tablet 3    Sig: Take 1 tablet (50 mg total) by mouth daily.    Authorizing Provider: ARIDA, MUHAMMAD A    Ordering User: LOPEZ, MARINA C    

## 2013-09-09 ENCOUNTER — Other Ambulatory Visit: Payer: Self-pay | Admitting: *Deleted

## 2013-09-09 MED ORDER — CARVEDILOL 6.25 MG PO TABS: 6.2500 mg | ORAL_TABLET | Freq: Two times a day (BID) | ORAL | Status: DC

## 2013-09-23 ENCOUNTER — Encounter: Payer: Self-pay | Admitting: Cardiovascular Disease

## 2013-09-23 ENCOUNTER — Ambulatory Visit (INDEPENDENT_AMBULATORY_CARE_PROVIDER_SITE_OTHER): Payer: Medicare Other | Admitting: Cardiovascular Disease

## 2013-09-23 VITALS — BP 120/50 | HR 66 | Ht 66.0 in | Wt 161.0 lb

## 2013-09-23 DIAGNOSIS — R079 Chest pain, unspecified: Secondary | ICD-10-CM

## 2013-09-23 DIAGNOSIS — K222 Esophageal obstruction: Secondary | ICD-10-CM

## 2013-09-23 DIAGNOSIS — I5022 Chronic systolic (congestive) heart failure: Secondary | ICD-10-CM

## 2013-09-23 DIAGNOSIS — R4702 Dysphasia: Secondary | ICD-10-CM

## 2013-09-23 DIAGNOSIS — R4789 Other speech disturbances: Secondary | ICD-10-CM

## 2013-09-23 DIAGNOSIS — I4891 Unspecified atrial fibrillation: Secondary | ICD-10-CM

## 2013-09-23 NOTE — Progress Notes (Signed)
HPI  This is a pleasant 80 year old female who is here today for a followup visit for chronic systolic heart failure due to recurrent stress-induced cardiomyopathy and chronic atrial fibrillation. She presented to [facility] in May, 2014 with acute respiratory failure. She was found to have an anterolateral ST elevation on EKG with troponin of 1.4. She was transferred to [facility] where she underwent emergent cardiac catheterization. It showed mild nonobstructive coronary artery disease with severe segmental LV systolic dysfunction suggestive of stress-induced cardiomyopathy. The patient was intubated. She developed right pleural effusion which required thoracentesis. She also developed atrial fibrillation which was treated with rate control. She was found to have an esophageal stricture which was dilated by GI. She ultimately was started on anticoagulation. She improved gradually and was discharged to a skilled nursing facility where she stayed there for a few weeks.  She was found to have evidence of peripheral arterial disease with mildly reduced ABI bilaterally. No significant claudication.  She was hospitalized again on November 9th with acute respiratory failure due to pneumonia and COPD exacerbation. Troponin was mildly elevated. Repeat echocardiogram showed an ejection fraction of 20-25% with wall motion abnormalities suggestive of stress-induced cardiomyopathy. She gradually improved with antibiotics and breathing treatments.  She has been doing well after adjusting her medications. Dyspnea and orthopnea improved. She denies any chest pain. She is having worsening dysphagia.  Allergies  Allergen Reactions  . Penicillins Itching, Swelling and Rash  . Sulfa Antibiotics      Current Outpatient Prescriptions on File Prior to Visit  Medication Sig Dispense Refill  . ADVAIR DISKUS 250-50 MCG/DOSE AEPB 2 puffs daily.       . carvedilol (COREG) 6.25 MG tablet Take 1 tablet (6.25 mg total) by mouth 2  (two) times daily with a meal.  180 tablet  3  . Cranberry-Vitamin C-Probiotic (AZO CRANBERRY PO) Take 2 tablets by mouth daily.      . fluticasone (FLONASE) 50 MCG/ACT nasal spray Place 2 sprays into the nose daily as needed.       . levalbuterol (XOPENEX HFA) 45 MCG/ACT inhaler Inhale 2 puffs into the lungs every 6 (six) hours as needed for wheezing or shortness of breath.      . loratadine (CLARITIN) 10 MG tablet Take 10 mg by mouth daily.      [name] losartan (COZAAR) 50 MG tablet Take 1 tablet (50 mg total) by mouth daily.  90 tablet  3  . omeprazole (PRILOSEC) 20 MG capsule Take 20 mg by mouth daily.      [name] PROAIR HFA 108 (90 BASE) MCG/ACT inhaler Inhale 1 puff into the lungs every 4 (four) hours as needed.       . pyridOXINE (VITAMIN B-6) 100 MG tablet Take 100 mg by mouth daily.      . Rivaroxaban (XARELTO) 15 MG TABS tablet Take 1 tablet (15 mg total) by mouth daily with supper.  30 tablet  6  . SALINE NASAL SPRAY NA Place into the nose 2 (two) times daily.      [name] tiotropium (SPIRIVA) 18 MCG inhalation capsule Place 18 mcg into inhaler and inhale daily.      . vitamin B-12 (CYANOCOBALAMIN) 1000 MCG tablet Take 1,000 mcg by mouth daily.      . vitamin C (ASCORBIC ACID) 500 MG tablet Take 500 mg by mouth daily.       No current facility-administered medications on file prior to visit.     Past Medical History  Diagnosis Date  . HTN (hypertension)   . COPD (chronic obstructive pulmonary disease)     a. on home O2 nightly.  . Diverticulosis   . Takotsubo cardiomyopathy 10/2012    a. 10/2012: presented w/ cardiogenic shock/systolic CHF/VDRF - EF 25% by cath, 40% by f/u echo - cath with mild nonobstructive CAD. b. Not discharged on ACEI due to hypotension.  . Cardiogenic shock     a. 10/2012 - due to Takotsubo CM.  [name] Respiratory failure     a. On home O2 nightly. b. VDRF 10/2012 - due to CAP/pulm edema in setting of cardiogenic shock/Takotsubo CM.  [name] CAP (community acquired pneumonia)      a. 10/2012 - will need f/u CXR in June 2014 to ensure clearance.  . Esophageal stricture     a. s/p dilitation 10/2012.  [name] Pleural effusion, right     a. s/p thoracentesis 10/28/12.  . Atrial fibrillation     a. Dx 10/2012, rate controlled, started on Xarelto.  . Claudication     a. ABI 10/2012 - R 0.87, L 0.74.  [name] GERD (gastroesophageal reflux disease)   . CKD (chronic kidney disease), stage III   . Breast CA     hx  . Abdominal hernia     chronic  . CAD (coronary artery disease)     a. 10/2012 - presented w/ STEMI felt due to Takotsubo - mild nonobstructive dz by cath.  [name] STEMI (ST elevation myocardial infarction)      Past Surgical History  Procedure Laterality Date  . Mastectomy    . Cataract extraction    . Esophagogastroduodenoscopy N/A 11/05/2012    Procedure: ESOPHAGOGASTRODUODENOSCOPY (EGD) with esophageal savary dilatation;  Surgeon: Missy Sabins, MD;  Location: [place];  Service: Endoscopy;  Laterality: N/A;  savary dil...no floro needed  . Bladder surgery       History reviewed. No pertinent family history.   History   Social History  . Marital Status: Widowed    Spouse Name: N/A    Number of Children: N/A  . Years of Education: N/A   Occupational History  . Not on file.   Social History Main Topics  . Smoking status: Never Smoker   . Smokeless tobacco: Never Used  . Alcohol Use: No  . Drug Use: No  . Sexual Activity: No   Other Topics Concern  . Not on file   Social History Narrative  . No narrative on file     PHYSICAL EXAM   BP 120/50  Pulse 66  Ht 5\' 6"  (1.676 m)  Wt 161 lb (73.029 kg)  BMI 26.00 kg/m2 Constitutional: She is oriented to person, place, and time. She appears well-developed and well-nourished. No distress.  HENT: No nasal discharge.  Head: Normocephalic and atraumatic.  Eyes: Pupils are equal and round.  Neck: Normal range of motion. Neck supple. No JVD present. No thyromegaly present.  Cardiovascular: Normal rate,  irregular rhythm, normal heart sounds. Exam reveals no gallop and no friction rub. No murmur heard.  Pulmonary/Chest: Effort normal . Very few crackles at the base. No stridor. No respiratory distress. She has no wheezes. She has no rales. She exhibits no tenderness.  Abdominal: Soft. Bowel sounds are normal. She exhibits no distension. There is no tenderness. There is no rebound and no guarding.  Musculoskeletal: Normal range of motion. She exhibits trace edema and no tenderness.  Neurological: She is alert and oriented to person, place, and time. Coordination normal.  Skin:  There is a small wound on the right shin which has healed nicely. No induration or warmth.  Psychiatric: She has a normal mood and affect. Her behavior is normal. Judgment and thought content normal.     EKG:  Atrial fibrillation  ABNORMAL RHYTHM   ASSESSMENT AND PLAN

## 2013-09-23 NOTE — Patient Instructions (Addendum)
Continue same medications.   Refer to Dr. Allen Norris (GI) for dysphagia.   Follow up in 3 months.

## 2013-09-24 ENCOUNTER — Encounter: Payer: Self-pay | Admitting: Cardiovascular Disease

## 2013-09-24 NOTE — Assessment & Plan Note (Signed)
She has chronic atrial fibrillation which is being treated with rate control. Continue current dose of carvedilol and long-term anticoagulation with Xarelto.

## 2013-09-24 NOTE — Assessment & Plan Note (Addendum)
She appears to be euvolemic on current medications. Continue current dose of carvedilol and losartan.

## 2013-09-24 NOTE — Assessment & Plan Note (Signed)
She is having recurrent dysphagia. Thus, on referring her to gastroenterology.

## 2013-09-27 ENCOUNTER — Telehealth: Payer: Self-pay | Admitting: *Deleted

## 2013-09-27 NOTE — Telephone Encounter (Signed)
Consult form faxed to [facility]

## 2013-09-29 ENCOUNTER — Telehealth: Payer: Self-pay | Admitting: *Deleted

## 2013-09-29 NOTE — Telephone Encounter (Signed)
Message copied by Tracie Harrier on Wed Sep 29, 2013  2:05 PM ------      Message from: Arie Sabina      Created: Wed Sep 29, 2013  1:40 PM       Ambulatory referral to Gastroenterology      When is her appointment? Thanks! ------

## 2013-09-29 NOTE — Telephone Encounter (Signed)
Pt has GI appt 09/30/13 3:30 pm

## 2013-10-12 ENCOUNTER — Ambulatory Visit: Payer: Self-pay | Admitting: Gastroenterology

## 2013-10-13 LAB — PATHOLOGY REPORT

## 2013-11-15 ENCOUNTER — Encounter: Payer: Self-pay | Admitting: *Deleted

## 2013-12-13 NOTE — Telephone Encounter (Signed)
This encounter was created in error - please disregard.

## 2013-12-24 ENCOUNTER — Ambulatory Visit: Payer: Medicare Other | Admitting: Cardiovascular Disease

## 2014-01-09 ENCOUNTER — Other Ambulatory Visit: Payer: Self-pay | Admitting: Cardiovascular Disease

## 2014-01-10 ENCOUNTER — Ambulatory Visit (INDEPENDENT_AMBULATORY_CARE_PROVIDER_SITE_OTHER): Payer: Medicare Other | Admitting: Cardiovascular Disease

## 2014-01-10 ENCOUNTER — Other Ambulatory Visit: Payer: Self-pay | Admitting: Cardiovascular Disease

## 2014-01-10 ENCOUNTER — Encounter: Payer: Self-pay | Admitting: Cardiovascular Disease

## 2014-01-10 VITALS — BP 118/62 | HR 58 | Ht 66.0 in | Wt 160.5 lb

## 2014-01-10 DIAGNOSIS — I5022 Chronic systolic (congestive) heart failure: Secondary | ICD-10-CM

## 2014-01-10 DIAGNOSIS — I4891 Unspecified atrial fibrillation: Secondary | ICD-10-CM

## 2014-01-10 LAB — CBC WITH DIFFERENTIAL/PLATELET
Basophil #: 0 10*3/uL (ref 0.0–0.1)
Basophil %: 0.4 %
Eosinophil #: 0.1 10*3/uL (ref 0.0–0.7)
Eosinophil %: 1.8 %
HCT: 38.1 % (ref 35.0–47.0)
HGB: 12.8 g/dL (ref 12.0–16.0)
Lymphocyte #: 2.4 10*3/uL (ref 1.0–3.6)
Lymphocyte %: 38.8 %
MCH: 32.1 pg (ref 26.0–34.0)
MCHC: 33.6 g/dL (ref 32.0–36.0)
MCV: 96 fL (ref 80–100)
Monocyte #: 0.7 x10 3/mm (ref 0.2–0.9)
Monocyte %: 11 %
NEUTROS PCT: 48 %
Neutrophil #: 2.9 10*3/uL (ref 1.4–6.5)
PLATELETS: 195 10*3/uL (ref 150–440)
RBC: 3.98 10*6/uL (ref 3.80–5.20)
RDW: 14.1 % (ref 11.5–14.5)
WBC: 6.1 10*3/uL (ref 3.6–11.0)

## 2014-01-10 MED ORDER — RIVAROXABAN 15 MG PO TABS: ORAL_TABLET | ORAL | Status: DC

## 2014-01-10 NOTE — Assessment & Plan Note (Signed)
Ventricular rate is well controlled on current dose of carvedilol. She is tolerating anticoagulation with Xarelto.  Check basic metabolic profile and CBC today.

## 2014-01-10 NOTE — Progress Notes (Signed)
HPI  This is a pleasant 80 year old female who is here today for a followup visit for chronic systolic heart failure due to recurrent stress-induced cardiomyopathy and chronic atrial fibrillation. She presented to [facility] in May, 2014 with acute respiratory failure. She was found to have an anterolateral ST elevation on EKG with troponin of 1.4. She was transferred to [facility] where she underwent emergent cardiac catheterization. It showed mild nonobstructive coronary artery disease with severe segmental LV systolic dysfunction suggestive of stress-induced cardiomyopathy. The patient was intubated. She developed right pleural effusion which required thoracentesis. She also developed atrial fibrillation which was treated with rate control. She was found to have an esophageal stricture which was dilated by GI. She ultimately was started on anticoagulation. She improved gradually and was discharged to a skilled nursing facility where she stayed there for a few weeks.  She was found to have evidence of peripheral arterial disease with mildly reduced ABI bilaterally. No significant claudication.  She was hospitalized again on November 9th with acute respiratory failure due to pneumonia and COPD exacerbation. Troponin was mildly elevated. Repeat echocardiogram showed an ejection fraction of 20-25% with wall motion abnormalities suggestive of stress-induced cardiomyopathy. She gradually improved with antibiotics and breathing treatments.  She has been doing well after adjusting her medications. Dyspnea and orthopnea improved. She denies any chest pain. She reports no bleeding complications with anticoagulation.  Allergies  Allergen Reactions  . Penicillins Itching, Swelling and Rash  . Sulfa Antibiotics      Current Outpatient Prescriptions on File Prior to Visit  Medication Sig Dispense Refill  . ADVAIR DISKUS 250-50 MCG/DOSE AEPB 2 puffs daily.       . carvedilol (COREG) 6.25 MG tablet Take 1 tablet (6.25  mg total) by mouth 2 (two) times daily with a meal.  180 tablet  3  . Cranberry-Vitamin C-Probiotic (AZO CRANBERRY PO) Take 2 tablets by mouth daily.      . fluticasone (FLONASE) 50 MCG/ACT nasal spray Place 2 sprays into the nose daily as needed.       . furosemide (LASIX) 20 MG tablet Take 20 mg by mouth every evening.      . furosemide (LASIX) 40 MG tablet Take 40 mg by mouth every morning.      . levalbuterol (XOPENEX HFA) 45 MCG/ACT inhaler Inhale 2 puffs into the lungs every 6 (six) hours as needed for wheezing or shortness of breath.      . loratadine (CLARITIN) 10 MG tablet Take 10 mg by mouth daily.      [name] losartan (COZAAR) 50 MG tablet Take 1 tablet (50 mg total) by mouth daily.  90 tablet  3  . omeprazole (PRILOSEC) 20 MG capsule Take 20 mg by mouth daily.      [name] PROAIR HFA 108 (90 BASE) MCG/ACT inhaler Inhale 1 puff into the lungs every 4 (four) hours as needed.       . pyridOXINE (VITAMIN B-6) 100 MG tablet Take 100 mg by mouth daily.      [name] SALINE NASAL SPRAY NA Place into the nose 2 (two) times daily.      [name] tiotropium (SPIRIVA) 18 MCG inhalation capsule Place 18 mcg into inhaler and inhale daily.      . vitamin B-12 (CYANOCOBALAMIN) 1000 MCG tablet Take 1,000 mcg by mouth daily.      . vitamin C (ASCORBIC ACID) 500 MG tablet Take 500 mg by mouth daily.      Alveda Reasons 15 MG  TABS tablet TAKE 1 TABLET Y MOUTH EVERY DAY  30 tablet  4   No current facility-administered medications on file prior to visit.     Past Medical History  Diagnosis Date  . HTN (hypertension)   . COPD (chronic obstructive pulmonary disease)     a. on home O2 nightly.  . Diverticulosis   . Takotsubo cardiomyopathy 10/2012    a. 10/2012: presented w/ cardiogenic shock/systolic CHF/VDRF - EF 12% by cath, 40% by f/u echo - cath with mild nonobstructive CAD. b. Not discharged on ACEI due to hypotension.  . Cardiogenic shock     a. 10/2012 - due to Takotsubo CM.  [name] Respiratory failure     a. On home O2  nightly. b. VDRF 10/2012 - due to CAP/pulm edema in setting of cardiogenic shock/Takotsubo CM.  [name] CAP (community acquired pneumonia)     a. 10/2012 - will need f/u CXR in June 2014 to ensure clearance.  . Esophageal stricture     a. s/p dilitation 10/2012.  [name] Pleural effusion, right     a. s/p thoracentesis 10/28/12.  . Atrial fibrillation     a. Dx 10/2012, rate controlled, started on Xarelto.  . Claudication     a. ABI 10/2012 - R 0.87, L 0.74.  [name] GERD (gastroesophageal reflux disease)   . CKD (chronic kidney disease), stage III   . Breast CA     hx  . Abdominal hernia     chronic  . CAD (coronary artery disease)     a. 10/2012 - presented w/ STEMI felt due to Takotsubo - mild nonobstructive dz by cath.  [name] STEMI (ST elevation myocardial infarction)      Past Surgical History  Procedure Laterality Date  . Mastectomy    . Cataract extraction    . Esophagogastroduodenoscopy N/A 11/05/2012    Procedure: ESOPHAGOGASTRODUODENOSCOPY (EGD) with esophageal savary dilatation;  Surgeon: Missy Sabins, MD;  Location: [place];  Service: Endoscopy;  Laterality: N/A;  savary dil...no floro needed  . Bladder surgery       History reviewed. No pertinent family history.   History   Social History  . Marital Status: Widowed    Spouse Name: N/A    Number of Children: N/A  . Years of Education: N/A   Occupational History  . Not on file.   Social History Main Topics  . Smoking status: Never Smoker   . Smokeless tobacco: Never Used  . Alcohol Use: No  . Drug Use: No  . Sexual Activity: No   Other Topics Concern  . Not on file   Social History Narrative  . No narrative on file     PHYSICAL EXAM   BP 118/62  Pulse 58  Ht 5\' 6"  (1.676 m)  Wt 160 lb 8 oz (72.802 kg)  BMI 25.92 kg/m2 Constitutional: She is oriented to person, place, and time. She appears well-developed and well-nourished. No distress.  HENT: No nasal discharge.  Head: Normocephalic and atraumatic.  Eyes:  Pupils are equal and round.  Neck: Normal range of motion. Neck supple. No JVD present. No thyromegaly present.  Cardiovascular: Normal rate, irregular rhythm, normal heart sounds. Exam reveals no gallop and no friction rub. No murmur heard.  Pulmonary/Chest: Effort normal . Very few crackles at the base. No stridor. No respiratory distress. She has no wheezes. She has no rales. She exhibits no tenderness.  Abdominal: Soft. Bowel sounds are normal. She exhibits no distension. There is no tenderness. There  is no rebound and no guarding.  Musculoskeletal: Normal range of motion. She exhibits trace edema and no tenderness.  Neurological: She is alert and oriented to person, place, and time. Coordination normal.  Skin: There is a small wound on the right shin which has healed nicely. No induration or warmth.  Psychiatric: She has a normal mood and affect. Her behavior is normal. Judgment and thought content normal.     EKG:  Atrial fibrillation  ABNORMAL RHYTHM   ASSESSMENT AND PLAN

## 2014-01-10 NOTE — Patient Instructions (Signed)
Your physician recommends that you have labs today: BMP CBC  Your physician recommends that you schedule a follow-up appointment in:  Dr. Fletcher Anon in 3 months   Xarelto has been refilled

## 2014-01-10 NOTE — Assessment & Plan Note (Signed)
She appears to be euvolemic on current dose of Lasix. Check basic metabolic profile. Continue treatment with carvedilol and losartan.

## 2014-01-19 ENCOUNTER — Telehealth: Payer: Self-pay | Admitting: *Deleted

## 2014-01-19 NOTE — Telephone Encounter (Signed)
Contacted lab at [facility] they stated the patient only gave them an order for CBC The BMP was never processed   Informed patient  Scheduled patient to have BMP 01/21/14

## 2014-01-19 NOTE — Telephone Encounter (Signed)
Message copied by Tracie Harrier on Wed Jan 19, 2014 11:09 AM ------      Message from: Kathlyn Sacramento A      Created: Tue Jan 11, 2014 12:03 PM       CBC is fine but I think we also ordered BMP. Do you have results of that? ------

## 2014-01-21 ENCOUNTER — Telehealth: Payer: Self-pay | Admitting: *Deleted

## 2014-01-21 ENCOUNTER — Other Ambulatory Visit: Payer: Self-pay | Admitting: Cardiovascular Disease

## 2014-01-21 ENCOUNTER — Ambulatory Visit (INDEPENDENT_AMBULATORY_CARE_PROVIDER_SITE_OTHER): Payer: Medicare Other

## 2014-01-21 ENCOUNTER — Ambulatory Visit: Payer: Medicare Other | Admitting: Cardiovascular Disease

## 2014-01-21 DIAGNOSIS — I4891 Unspecified atrial fibrillation: Secondary | ICD-10-CM

## 2014-01-21 LAB — BASIC METABOLIC PANEL
ANION GAP: 7 (ref 7–16)
BUN: 17 mg/dL (ref 7–18)
CALCIUM: 9.1 mg/dL (ref 8.5–10.1)
Chloride: 97 mmol/L — ABNORMAL LOW (ref 98–107)
Co2: 34 mmol/L — ABNORMAL HIGH (ref 21–32)
Creatinine: 1.19 mg/dL (ref 0.60–1.30)
EGFR (African American): 47 — ABNORMAL LOW
EGFR (Non-African Amer.): 40 — ABNORMAL LOW
GLUCOSE: 98 mg/dL (ref 65–99)
OSMOLALITY: 277 (ref 275–301)
Potassium: 4.4 mmol/L (ref 3.5–5.1)
SODIUM: 138 mmol/L (ref 136–145)

## 2014-01-21 NOTE — Telephone Encounter (Signed)
Spoke w/ pt.  She reports wheezing and SOB x 1 week.  She is not coughing anything up, but was previously hospitalized for acute resp failure.  She reports that her PCP has left and she is unfamiliar w/ the doctor that is covering.  Pt is sched to see Ignacia Bayley, NP today at 2:15, though she may be a few mins late.  Pt called back and states that her daughter in law cannot get her here until her 4:10 appt for lab draw.  Advised pt that she is only having labs drawn at that time, she is not seeing a provider.  Advised pt that we can work w/ her on the time of her appt, but she repeats that she will just come in for labs.  Asked pt to call her PCP and let me know if we can assist her further.

## 2014-01-21 NOTE — Telephone Encounter (Signed)
Patient called and she is sob and wheezing.

## 2014-01-28 NOTE — Progress Notes (Signed)
LVM 8/21 

## 2014-02-11 NOTE — Progress Notes (Signed)
LVM 9/4

## 2014-03-08 ENCOUNTER — Ambulatory Visit: Payer: Self-pay

## 2014-03-10 ENCOUNTER — Ambulatory Visit: Payer: Self-pay | Admitting: Internal Medicine

## 2014-03-10 ENCOUNTER — Inpatient Hospital Stay: Payer: Self-pay | Admitting: Internal Medicine

## 2014-03-10 LAB — URINALYSIS, COMPLETE
Bacteria: NONE SEEN
Bilirubin,UR: NEGATIVE
Glucose,UR: 50 mg/dL (ref 0–75)
Ketone: NEGATIVE
LEUKOCYTE ESTERASE: NEGATIVE
Nitrite: NEGATIVE
Ph: 5 (ref 4.5–8.0)
Protein: 30
Specific Gravity: 1.016 (ref 1.003–1.030)
Squamous Epithelial: 1
WBC UR: <1 /HPF (ref 0–5)

## 2014-03-10 LAB — CBC
HCT: 37.3 % (ref 35.0–47.0)
HGB: 12.1 g/dL (ref 12.0–16.0)
MCH: 31.3 pg (ref 26.0–34.0)
MCHC: 32.3 g/dL (ref 32.0–36.0)
MCV: 97 fL (ref 80–100)
Platelet: 210 10*3/uL (ref 150–440)
RBC: 3.86 10*6/uL (ref 3.80–5.20)
RDW: 13.9 % (ref 11.5–14.5)
WBC: 9.2 10*3/uL (ref 3.6–11.0)

## 2014-03-10 LAB — CK-MB
CK-MB: 1.6 ng/mL (ref 0.5–3.6)
CK-MB: 2.5 ng/mL (ref 0.5–3.6)
CK-MB: 2.6 ng/mL (ref 0.5–3.6)

## 2014-03-10 LAB — BASIC METABOLIC PANEL
ANION GAP: 8 (ref 7–16)
BUN: 17 mg/dL (ref 7–18)
CALCIUM: 8.7 mg/dL (ref 8.5–10.1)
Chloride: 96 mmol/L — ABNORMAL LOW (ref 98–107)
Co2: 33 mmol/L — ABNORMAL HIGH (ref 21–32)
Creatinine: 0.94 mg/dL (ref 0.60–1.30)
EGFR (African American): >60
EGFR (Non-African Amer.): 60 — ABNORMAL LOW
Glucose: 184 mg/dL — ABNORMAL HIGH (ref 65–99)
OSMOLALITY: 280 (ref 275–301)
Potassium: 4.2 mmol/L (ref 3.5–5.1)
Sodium: 137 mmol/L (ref 136–145)

## 2014-03-10 LAB — TROPONIN I
TROPONIN-I: 0.51 ng/mL — AB
Troponin-I: 0.36 ng/mL — ABNORMAL HIGH
Troponin-I: 0.43 ng/mL — ABNORMAL HIGH

## 2014-03-11 LAB — CBC WITH DIFFERENTIAL/PLATELET
Basophil #: 0 10*3/uL (ref 0.0–0.1)
Basophil %: 0.2 %
EOS ABS: 0 10*3/uL (ref 0.0–0.7)
Eosinophil %: 0 %
HCT: 37.6 % (ref 35.0–47.0)
HGB: 12.6 g/dL (ref 12.0–16.0)
Lymphocyte #: 1.4 10*3/uL (ref 1.0–3.6)
Lymphocyte %: 14.7 %
MCH: 32.1 pg (ref 26.0–34.0)
MCHC: 33.5 g/dL (ref 32.0–36.0)
MCV: 96 fL (ref 80–100)
MONOS PCT: 6.9 %
Monocyte #: 0.6 x10 3/mm (ref 0.2–0.9)
NEUTROS ABS: 7.3 10*3/uL — AB (ref 1.4–6.5)
NEUTROS PCT: 78.2 %
Platelet: 215 10*3/uL (ref 150–440)
RBC: 3.91 10*6/uL (ref 3.80–5.20)
RDW: 13.6 % (ref 11.5–14.5)
WBC: 9.3 10*3/uL (ref 3.6–11.0)

## 2014-03-11 LAB — BASIC METABOLIC PANEL
Anion Gap: 2 — ABNORMAL LOW (ref 7–16)
BUN: 15 mg/dL (ref 7–18)
CHLORIDE: 96 mmol/L — AB (ref 98–107)
CO2: 38 mmol/L — AB (ref 21–32)
Calcium, Total: 8.8 mg/dL (ref 8.5–10.1)
Creatinine: 1.03 mg/dL (ref 0.60–1.30)
EGFR (African American): >60
EGFR (Non-African Amer.): 54 — ABNORMAL LOW
Glucose: 129 mg/dL — ABNORMAL HIGH (ref 65–99)
OSMOLALITY: 274 (ref 275–301)
Potassium: 4.2 mmol/L (ref 3.5–5.1)
Sodium: 136 mmol/L (ref 136–145)

## 2014-03-12 DIAGNOSIS — I361 Nonrheumatic tricuspid (valve) insufficiency: Secondary | ICD-10-CM

## 2014-03-12 DIAGNOSIS — I4891 Unspecified atrial fibrillation: Secondary | ICD-10-CM

## 2014-03-12 DIAGNOSIS — I429 Cardiomyopathy, unspecified: Secondary | ICD-10-CM

## 2014-03-12 DIAGNOSIS — R7989 Other specified abnormal findings of blood chemistry: Secondary | ICD-10-CM

## 2014-03-12 DIAGNOSIS — J449 Chronic obstructive pulmonary disease, unspecified: Secondary | ICD-10-CM

## 2014-03-13 LAB — EXPECTORATED SPUTUM ASSESSMENT W GRAM STAIN, RFLX TO RESP C

## 2014-03-13 LAB — BASIC METABOLIC PANEL
ANION GAP: 4 — AB (ref 7–16)
BUN: 34 mg/dL — ABNORMAL HIGH (ref 7–18)
CO2: 37 mmol/L — AB (ref 21–32)
Calcium, Total: 8.5 mg/dL (ref 8.5–10.1)
Chloride: 97 mmol/L — ABNORMAL LOW (ref 98–107)
Creatinine: 1.16 mg/dL (ref 0.60–1.30)
GFR CALC AF AMER: 57 — AB
GFR CALC NON AF AMER: 47 — AB
Glucose: 104 mg/dL — ABNORMAL HIGH (ref 65–99)
Osmolality: 284 (ref 275–301)
POTASSIUM: 4.3 mmol/L (ref 3.5–5.1)
SODIUM: 138 mmol/L (ref 136–145)

## 2014-03-14 LAB — BASIC METABOLIC PANEL
Anion Gap: 4 — ABNORMAL LOW (ref 7–16)
BUN: 33 mg/dL — ABNORMAL HIGH (ref 7–18)
CREATININE: 1.05 mg/dL (ref 0.60–1.30)
Calcium, Total: 8.6 mg/dL (ref 8.5–10.1)
Chloride: 98 mmol/L (ref 98–107)
Co2: 37 mmol/L — ABNORMAL HIGH (ref 21–32)
EGFR (African American): >60
GFR CALC NON AF AMER: 52 — AB
Glucose: 135 mg/dL — ABNORMAL HIGH (ref 65–99)
Osmolality: 287 (ref 275–301)
POTASSIUM: 4.1 mmol/L (ref 3.5–5.1)
SODIUM: 139 mmol/L (ref 136–145)

## 2014-03-15 LAB — CULTURE, BLOOD (SINGLE)

## 2014-03-16 LAB — HEMOGLOBIN A1C: Hemoglobin A1C: 6.9 % — ABNORMAL HIGH (ref 4.2–6.3)

## 2014-03-18 LAB — CREATININE, SERUM
Creatinine: 1.34 mg/dL — ABNORMAL HIGH (ref 0.60–1.30)
GFR CALC AF AMER: 48 — AB
GFR CALC NON AF AMER: 40 — AB

## 2014-03-18 LAB — PLATELET COUNT: PLATELETS: 330 10*3/uL (ref 150–440)

## 2014-03-19 LAB — BASIC METABOLIC PANEL
ANION GAP: 7 (ref 7–16)
BUN: 50 mg/dL — AB (ref 7–18)
CALCIUM: 8.1 mg/dL — AB (ref 8.5–10.1)
CO2: 35 mmol/L — AB (ref 21–32)
Chloride: 99 mmol/L (ref 98–107)
Creatinine: 1.26 mg/dL (ref 0.60–1.30)
EGFR (African American): 52 — ABNORMAL LOW
EGFR (Non-African Amer.): 42 — ABNORMAL LOW
GLUCOSE: 119 mg/dL — AB (ref 65–99)
Osmolality: 296 (ref 275–301)
Potassium: 4.6 mmol/L (ref 3.5–5.1)
Sodium: 141 mmol/L (ref 136–145)

## 2014-03-19 LAB — HEMOGLOBIN: HGB: 12.2 g/dL (ref 12.0–16.0)

## 2014-03-19 LAB — MAGNESIUM: Magnesium: 2.2 mg/dL

## 2014-03-20 LAB — BASIC METABOLIC PANEL
ANION GAP: 5 — AB (ref 7–16)
BUN: 42 mg/dL — ABNORMAL HIGH (ref 7–18)
Calcium, Total: 8.2 mg/dL — ABNORMAL LOW (ref 8.5–10.1)
Chloride: 98 mmol/L (ref 98–107)
Co2: 35 mmol/L — ABNORMAL HIGH (ref 21–32)
Creatinine: 1.11 mg/dL (ref 0.60–1.30)
EGFR (African American): 60 — ABNORMAL LOW
GFR CALC NON AF AMER: 49 — AB
Glucose: 112 mg/dL — ABNORMAL HIGH (ref 65–99)
Osmolality: 287 (ref 275–301)
Potassium: 4.6 mmol/L (ref 3.5–5.1)
Sodium: 138 mmol/L (ref 136–145)

## 2014-04-03 ENCOUNTER — Other Ambulatory Visit: Payer: Self-pay | Admitting: Cardiovascular Disease

## 2014-04-08 ENCOUNTER — Encounter: Payer: Self-pay | Admitting: Cardiovascular Disease

## 2014-04-08 ENCOUNTER — Ambulatory Visit (INDEPENDENT_AMBULATORY_CARE_PROVIDER_SITE_OTHER): Payer: Medicare Other | Admitting: Cardiovascular Disease

## 2014-04-08 ENCOUNTER — Ambulatory Visit: Payer: Self-pay | Admitting: Family Medicine

## 2014-04-08 VITALS — BP 120/60 | HR 81 | Ht 66.0 in | Wt 157.8 lb

## 2014-04-08 DIAGNOSIS — I5022 Chronic systolic (congestive) heart failure: Secondary | ICD-10-CM

## 2014-04-08 DIAGNOSIS — I482 Chronic atrial fibrillation, unspecified: Secondary | ICD-10-CM

## 2014-04-08 DIAGNOSIS — I10 Essential (primary) hypertension: Secondary | ICD-10-CM

## 2014-04-08 MED ORDER — FUROSEMIDE 40 MG PO TABS: ORAL_TABLET | ORAL | Status: DC

## 2014-04-08 NOTE — Assessment & Plan Note (Signed)
Seems to be stable with rate control on current dose of carvedilol. She is tolerating anticoagulation with renal dose Xarelto.

## 2014-04-08 NOTE — Assessment & Plan Note (Addendum)
She does have some crackles at the base with mild jugular venous distention. Furosemide was resumed recently. Continue current dose of 40 mg in the morning and 20 in the afternoon. Most recent ejection fraction actually improved to 50-55%. She appeared to be tired during the visit. Oxygen saturation was 92% on room air. I advised her to start using portable oxygen with activities.

## 2014-04-08 NOTE — Patient Instructions (Signed)
Your physician has recommended you make the following change in your medication:  Take Lasix 40 mg in the morning and 20 mg at night   Use oxygen with activity   Your physician recommends that you schedule a follow-up appointment in:  2 months   Your next appointment will be scheduled in our new office located at :  [facility]  [address], [place]  [place], [place] [address]

## 2014-04-08 NOTE — Progress Notes (Signed)
Primary care physician: Dr. Luan Pulling  HPI  This is a pleasant 80 year old female who is here today for a followup visit for chronic systolic heart failure due to recurrent stress-induced cardiomyopathy and chronic atrial fibrillation. She presented to [facility] in May, 2014 with acute respiratory failure. She was found to have an anterolateral ST elevation on EKG with troponin of 1.4. She was transferred to [facility] where she underwent emergent cardiac catheterization. It showed mild nonobstructive coronary artery disease with severe segmental LV systolic dysfunction suggestive of stress-induced cardiomyopathy. The patient was intubated. She developed right pleural effusion which required thoracentesis. She also developed atrial fibrillation which was treated with rate control. She was found to have an esophageal stricture which was dilated by GI. She ultimately was started on anticoagulation. She improved gradually and was discharged to a skilled nursing facility where she stayed there for a few weeks.  She was found to have evidence of peripheral arterial disease with mildly reduced ABI bilaterally. No significant claudication.  She was hospitalized again on November 9th with acute respiratory failure due to pneumonia and COPD exacerbation. Troponin was mildly elevated. Repeat echocardiogram showed an ejection fraction of 20-25% with wall motion abnormalities suggestive of stress-induced cardiomyopathy. She gradually improved with antibiotics and breathing treatments.  She was again hospitalized recently at [facility] for pneumonia and heart failure. She had a repeat echocardiogram which showed actually improvement in LV systolic function with an ejection fraction of 50-55%. She improved with antibiotics and breathing treatments. Lasix was held due to some element of renal failure. The patient still complains of weakness and shortness of breath. Losartan was stopped last week by Dr. Luan Pulling due to low blood pressure.  Furosemide was resumed.  Allergies  Allergen Reactions  . Penicillins Itching, Swelling and Rash  . Sulfa Antibiotics      Current Outpatient Prescriptions on File Prior to Visit  Medication Sig Dispense Refill  . ADVAIR DISKUS 250-50 MCG/DOSE AEPB 2 puffs daily.       . carvedilol (COREG) 6.25 MG tablet Take 1 tablet (6.25 mg total) by mouth 2 (two) times daily with a meal.  180 tablet  3  . Cranberry-Vitamin C-Probiotic (AZO CRANBERRY PO) Take 2 tablets by mouth daily.      . fluticasone (FLONASE) 50 MCG/ACT nasal spray Place 2 sprays into the nose daily as needed.       . levalbuterol (XOPENEX HFA) 45 MCG/ACT inhaler Inhale 2 puffs into the lungs every 6 (six) hours as needed for wheezing or shortness of breath.      . loratadine (CLARITIN) 10 MG tablet Take 10 mg by mouth daily.      [name] omeprazole (PRILOSEC) 20 MG capsule Take 20 mg by mouth daily.      [name] PROAIR HFA 108 (90 BASE) MCG/ACT inhaler Inhale 1 puff into the lungs every 4 (four) hours as needed.       . pyridOXINE (VITAMIN B-6) 100 MG tablet Take 100 mg by mouth daily.      . Rivaroxaban (XARELTO) 15 MG TABS tablet TAKE 1 TABLET Y MOUTH EVERY DAY  30 tablet  6  . SALINE NASAL SPRAY NA Place into the nose 2 (two) times daily.      [name] tiotropium (SPIRIVA) 18 MCG inhalation capsule Place 18 mcg into inhaler and inhale daily.      . vitamin B-12 (CYANOCOBALAMIN) 1000 MCG tablet Take 1,000 mcg by mouth daily.      . vitamin C (ASCORBIC ACID) 500 MG  tablet Take 500 mg by mouth daily.       No current facility-administered medications on file prior to visit.     Past Medical History  Diagnosis Date  . HTN (hypertension)   . COPD (chronic obstructive pulmonary disease)     a. on home O2 nightly.  . Diverticulosis   . Takotsubo cardiomyopathy 10/2012    a. 10/2012: presented w/ cardiogenic shock/systolic CHF/VDRF - EF 24% by cath, 40% by f/u echo - cath with mild nonobstructive CAD. b. Not discharged on ACEI due to hypotension.    . Cardiogenic shock     a. 10/2012 - due to Takotsubo CM.  [name] Respiratory failure     a. On home O2 nightly. b. VDRF 10/2012 - due to CAP/pulm edema in setting of cardiogenic shock/Takotsubo CM.  [name] CAP (community acquired pneumonia)     a. 10/2012 - will need f/u CXR in June 2014 to ensure clearance.  . Esophageal stricture     a. s/p dilitation 10/2012.  [name] Pleural effusion, right     a. s/p thoracentesis 10/28/12.  . Atrial fibrillation     a. Dx 10/2012, rate controlled, started on Xarelto.  . Claudication     a. ABI 10/2012 - R 0.87, L 0.74.  [name] GERD (gastroesophageal reflux disease)   . CKD (chronic kidney disease), stage III   . Breast CA     hx  . Abdominal hernia     chronic  . CAD (coronary artery disease)     a. 10/2012 - presented w/ STEMI felt due to Takotsubo - mild nonobstructive dz by cath.  [name] STEMI (ST elevation myocardial infarction)      Past Surgical History  Procedure Laterality Date  . Mastectomy    . Cataract extraction    . Esophagogastroduodenoscopy N/A 11/05/2012    Procedure: ESOPHAGOGASTRODUODENOSCOPY (EGD) with esophageal savary dilatation;  Surgeon: Missy Sabins, MD;  Location: [place];  Service: Endoscopy;  Laterality: N/A;  savary dil...no floro needed  . Bladder surgery       History reviewed. No pertinent family history.   History   Social History  . Marital Status: Widowed    Spouse Name: N/A    Number of Children: N/A  . Years of Education: N/A   Occupational History  . Not on file.   Social History Main Topics  . Smoking status: Never Smoker   . Smokeless tobacco: Never Used  . Alcohol Use: No  . Drug Use: No  . Sexual Activity: No   Other Topics Concern  . Not on file   Social History Narrative  . No narrative on file     PHYSICAL EXAM   BP 120/60  Pulse 81  Ht 5\' 6"  (1.676 m)  Wt 157 lb 12 oz (71.555 kg)  BMI 25.47 kg/m2 Constitutional: She is oriented to person, place, and time. She appears well-developed and  well-nourished. No distress.  HENT: No nasal discharge.  Head: Normocephalic and atraumatic.  Eyes: Pupils are equal and round.  Neck: Normal range of motion. Neck supple. No JVD present. No thyromegaly present.  Cardiovascular: Normal rate, irregular rhythm, normal heart sounds. Exam reveals no gallop and no friction rub. No murmur heard.  Pulmonary/Chest: Effort normal . Very few crackles at the base. No stridor. No respiratory distress. She has no wheezes. She has no rales. She exhibits no tenderness.  Abdominal: Soft. Bowel sounds are normal. She exhibits no distension. There is no tenderness. There is no  rebound and no guarding.  Musculoskeletal: Normal range of motion. She exhibits trace edema and no tenderness.  Neurological: She is alert and oriented to person, place, and time. Coordination normal.  Skin: There is a small wound on the right shin which has healed nicely. No induration or warmth.  Psychiatric: She has a normal mood and affect. Her behavior is normal. Judgment and thought content normal.     EKG:  atrial fibrillation  -  Nonspecific T-abnormality.   ABNORMAL    ASSESSMENT AND PLAN

## 2014-04-08 NOTE — Assessment & Plan Note (Signed)
Losartan was stopped due to low blood pressure. Continue carvedilol.

## 2014-04-10 ENCOUNTER — Ambulatory Visit: Payer: Self-pay | Admitting: Internal Medicine

## 2014-04-11 ENCOUNTER — Telehealth: Payer: Self-pay | Admitting: *Deleted

## 2014-04-11 NOTE — Telephone Encounter (Signed)
Patient wanting a new order for oxygen. Her tank is too heavy to carry around. Please call

## 2014-04-12 NOTE — Telephone Encounter (Signed)
Patient to call back with info for o2 company 11/03

## 2014-04-12 NOTE — Telephone Encounter (Signed)
Called back with [name] [name], Located: p o box [place]   [id number] needs little tank.  Please call back with info on this.

## 2014-04-13 NOTE — Telephone Encounter (Signed)
That's fine with me. Just see how we can arrange for this.

## 2014-04-13 NOTE — Telephone Encounter (Signed)
Patient would like a smaller O2 tank

## 2014-04-14 NOTE — Telephone Encounter (Signed)
Order in [place] box to sign

## 2014-04-18 ENCOUNTER — Ambulatory Visit: Payer: Medicare Other | Admitting: Cardiovascular Disease

## 2014-04-20 NOTE — Telephone Encounter (Signed)
o2 rx faxed

## 2014-04-21 ENCOUNTER — Telehealth: Payer: Self-pay | Admitting: Cardiovascular Disease

## 2014-04-21 NOTE — Telephone Encounter (Signed)
Follow up     Patient calling regarding portable concentrator

## 2014-04-21 NOTE — Telephone Encounter (Signed)
Patient is going to follow up with PCP  She is going to have him refer her to another O2 company because [employer] does not have the O2 machine she wanted

## 2014-04-21 NOTE — Telephone Encounter (Signed)
New message     FYI Want Dr Fletcher Anon to know that they are unable to provide pt with the portable concentrator because of her ins.

## 2014-05-19 ENCOUNTER — Encounter (HOSPITAL_COMMUNITY): Payer: Self-pay | Admitting: Cardiovascular Disease

## 2014-05-20 ENCOUNTER — Ambulatory Visit: Payer: Self-pay | Admitting: Family Medicine

## 2014-05-26 ENCOUNTER — Encounter: Payer: Self-pay | Admitting: Cardiovascular Disease

## 2014-05-26 ENCOUNTER — Ambulatory Visit (INDEPENDENT_AMBULATORY_CARE_PROVIDER_SITE_OTHER): Payer: Medicare Other | Admitting: Cardiovascular Disease

## 2014-05-26 VITALS — BP 140/62 | HR 74 | Ht 66.0 in | Wt 156.5 lb

## 2014-05-26 DIAGNOSIS — I482 Chronic atrial fibrillation, unspecified: Secondary | ICD-10-CM

## 2014-05-26 DIAGNOSIS — I5022 Chronic systolic (congestive) heart failure: Secondary | ICD-10-CM

## 2014-05-26 DIAGNOSIS — I739 Peripheral vascular disease, unspecified: Secondary | ICD-10-CM

## 2014-05-26 DIAGNOSIS — R079 Chest pain, unspecified: Secondary | ICD-10-CM

## 2014-05-26 DIAGNOSIS — I10 Essential (primary) hypertension: Secondary | ICD-10-CM

## 2014-05-26 LAB — CBC WITH DIFFERENTIAL
BASOS: 0 %
Basophils Absolute: 0 10*3/uL (ref 0.0–0.2)
EOS: 2 %
Eosinophils Absolute: 0.1 10*3/uL (ref 0.0–0.4)
HCT: 36.4 % (ref 34.0–46.6)
Hemoglobin: 12.3 g/dL (ref 11.1–15.9)
Immature Grans (Abs): 0 10*3/uL (ref 0.0–0.1)
Immature Granulocytes: 0 %
Lymphocytes Absolute: 2.2 10*3/uL (ref 0.7–3.1)
Lymphs: 37 %
MCH: 31.1 pg (ref 26.6–33.0)
MCHC: 33.8 g/dL (ref 31.5–35.7)
MCV: 92 fL (ref 79–97)
MONOS ABS: 0.6 10*3/uL (ref 0.1–0.9)
Monocytes: 10 %
Neutrophils Absolute: 3 10*3/uL (ref 1.4–7.0)
Neutrophils Relative %: 51 %
Platelets: 218 10*3/uL (ref 150–379)
RBC: 3.96 x10E6/uL (ref 3.77–5.28)
RDW: 14.1 % (ref 12.3–15.4)
WBC: 5.9 10*3/uL (ref 3.4–10.8)

## 2014-05-26 NOTE — Assessment & Plan Note (Signed)
She appears to be euvolemic today. Lasix was increased last week. Continue current dose of 40 mg twice daily. I'm going to check CBC and basic metabolic profile today.

## 2014-05-26 NOTE — Assessment & Plan Note (Signed)
She has chronic atrial fibrillation treated with rate control. She is tolerating anticoagulation with no reported bleeding side effects.

## 2014-05-26 NOTE — Assessment & Plan Note (Signed)
Blood pressure is reasonably controlled on carvedilol.

## 2014-05-26 NOTE — Assessment & Plan Note (Signed)
She has no claudication. Continue medical therapy.

## 2014-05-26 NOTE — Progress Notes (Signed)
Primary care physician: Dr. Luan Pulling  HPI  This is a pleasant 80 year old female who is here today for a followup visit for chronic systolic heart failure due to recurrent stress-induced cardiomyopathy and chronic atrial fibrillation. She presented to [facility] in May, 2014 with acute respiratory failure. She was found to have an anterolateral ST elevation on EKG with troponin of 1.4. She was transferred to [facility] where she underwent emergent cardiac catheterization. It showed mild nonobstructive coronary artery disease with severe segmental LV systolic dysfunction suggestive of stress-induced cardiomyopathy. The patient was intubated. She developed right pleural effusion which required thoracentesis. She also developed atrial fibrillation which was treated with rate control. She was found to have an esophageal stricture which was dilated by GI. She ultimately was started on anticoagulation. She improved gradually and was discharged to a skilled nursing facility where she stayed there for a few weeks.  She was found to have evidence of peripheral arterial disease with mildly reduced ABI bilaterally. No significant claudication.  She was hospitalized again on November 9th with acute respiratory failure due to pneumonia and COPD exacerbation. Troponin was mildly elevated. Repeat echocardiogram showed an ejection fraction of 20-25% with wall motion abnormalities suggestive of stress-induced cardiomyopathy. She gradually improved with antibiotics and breathing treatments.  She was again hospitalized recently at [facility] for pneumonia and heart failure. She had a repeat echocardiogram which showed actually improvement in LV systolic function with an ejection fraction of 50-55%.  She had worsening leg edema and shortness of breath recently. Lasix was increased to 40 mg twice daily last week by Dr. Luan Pulling. She feels better since then. She describes chronic substernal chest pain and shortness of breath.   Allergies    Allergen Reactions  . Penicillins Itching, Swelling and Rash  . Sulfa Antibiotics      Current Outpatient Prescriptions on File Prior to Visit  Medication Sig Dispense Refill  . ADVAIR DISKUS 250-50 MCG/DOSE AEPB 2 puffs daily.     . carvedilol (COREG) 6.25 MG tablet Take 1 tablet (6.25 mg total) by mouth 2 (two) times daily with a meal. 180 tablet 3  . Cranberry-Vitamin C-Probiotic (AZO CRANBERRY PO) Take 2 tablets by mouth daily.    . fluticasone (FLONASE) 50 MCG/ACT nasal spray Place 2 sprays into the nose daily as needed.     . levalbuterol (XOPENEX HFA) 45 MCG/ACT inhaler Inhale 2 puffs into the lungs every 6 (six) hours as needed for wheezing or shortness of breath.    . loratadine (CLARITIN) 10 MG tablet Take 10 mg by mouth daily.    [name] omeprazole (PRILOSEC) 20 MG capsule Take 20 mg by mouth daily.    [name] PROAIR HFA 108 (90 BASE) MCG/ACT inhaler Inhale 1 puff into the lungs every 4 (four) hours as needed.     . pyridOXINE (VITAMIN B-6) 100 MG tablet Take 100 mg by mouth daily.    . Rivaroxaban (XARELTO) 15 MG TABS tablet TAKE 1 TABLET Y MOUTH EVERY DAY 30 tablet 6  . SALINE NASAL SPRAY NA Place into the nose 2 (two) times daily.    [name] tiotropium (SPIRIVA) 18 MCG inhalation capsule Place 18 mcg into inhaler and inhale daily.    . vitamin B-12 (CYANOCOBALAMIN) 1000 MCG tablet Take 1,000 mcg by mouth daily.    . vitamin C (ASCORBIC ACID) 500 MG tablet Take 500 mg by mouth daily.     No current facility-administered medications on file prior to visit.     Past Medical History  Diagnosis Date  . HTN (hypertension)   . COPD (chronic obstructive pulmonary disease)     a. on home O2 nightly.  . Diverticulosis   . Takotsubo cardiomyopathy 10/2012    a. 10/2012: presented w/ cardiogenic shock/systolic CHF/VDRF - EF 62% by cath, 40% by f/u echo - cath with mild nonobstructive CAD. b. Not discharged on ACEI due to hypotension.  . Cardiogenic shock     a. 10/2012 - due to Takotsubo CM.  [name]  Respiratory failure     a. On home O2 nightly. b. VDRF 10/2012 - due to CAP/pulm edema in setting of cardiogenic shock/Takotsubo CM.  [name] CAP (community acquired pneumonia)     a. 10/2012 - will need f/u CXR in June 2014 to ensure clearance.  . Esophageal stricture     a. s/p dilitation 10/2012.  [name] Pleural effusion, right     a. s/p thoracentesis 10/28/12.  . Atrial fibrillation     a. Dx 10/2012, rate controlled, started on Xarelto.  . Claudication     a. ABI 10/2012 - R 0.87, L 0.74.  [name] GERD (gastroesophageal reflux disease)   . CKD (chronic kidney disease), stage III   . Breast CA     hx  . Abdominal hernia     chronic  . CAD (coronary artery disease)     a. 10/2012 - presented w/ STEMI felt due to Takotsubo - mild nonobstructive dz by cath.  [name] STEMI (ST elevation myocardial infarction)      Past Surgical History  Procedure Laterality Date  . Mastectomy    . Cataract extraction    . Esophagogastroduodenoscopy N/A 11/05/2012    Procedure: ESOPHAGOGASTRODUODENOSCOPY (EGD) with esophageal savary dilatation;  Surgeon: Missy Sabins, MD;  Location: [place];  Service: Endoscopy;  Laterality: N/A;  savary dil...no floro needed  . Bladder surgery    . Left heart catheterization with coronary angiogram N/A 10/24/2012    Procedure: LEFT HEART CATHETERIZATION WITH CORONARY ANGIOGRAM;  Surgeon: Sherren Mocha, MD;  Location: [facility] CATH LAB;  Service: Cardiovascular;  Laterality: N/A;     Family History  Problem Relation Age of Onset  . Family history unknown: Yes     History   Social History  . Marital Status: Widowed    Spouse Name: N/A    Number of Children: N/A  . Years of Education: N/A   Occupational History  . Not on file.   Social History Main Topics  . Smoking status: Never Smoker   . Smokeless tobacco: Never Used  . Alcohol Use: No  . Drug Use: No  . Sexual Activity: No   Other Topics Concern  . Not on file   Social History Narrative     PHYSICAL EXAM   BP  140/62 mmHg  Pulse 74  Ht 5\' 6"  (1.676 m)  Wt 156 lb 8 oz (70.988 kg)  BMI 25.27 kg/m2 Constitutional: She is oriented to person, place, and time. She appears well-developed and well-nourished. No distress.  HENT: No nasal discharge.  Head: Normocephalic and atraumatic.  Eyes: Pupils are equal and round.  Neck: Normal range of motion. Neck supple. No JVD present. No thyromegaly present.  Cardiovascular: Normal rate, irregular rhythm, normal heart sounds. Exam reveals no gallop and no friction rub. No murmur heard.  Pulmonary/Chest: Effort normal . Very few crackles at the base. No stridor. No respiratory distress. She has no wheezes. She has no rales. She exhibits no tenderness.  Abdominal: Soft. Bowel sounds are normal. She  exhibits no distension. There is no tenderness. There is no rebound and no guarding.  Musculoskeletal: Normal range of motion. She exhibits trace edema and no tenderness.  Neurological: She is alert and oriented to person, place, and time. Coordination normal.  Skin: There is a small wound on the right shin which has healed nicely. No induration or warmth.  Psychiatric: She has a normal mood and affect. Her behavior is normal. Judgment and thought content normal.     EKG:  Atrial fibrillation  -Nonspecific ST depression  -Nondiagnostic.   ABNORMAL    ASSESSMENT AND PLAN

## 2014-05-26 NOTE — Patient Instructions (Signed)
Your physician recommends that you have labs today: CBC  BMP  Your physician recommends that you schedule a follow-up appointment in:  3 months with Dr. Fletcher Anon    Your physician recommends that you continue on your current medications as directed. Please refer to the Current Medication list given to you today.

## 2014-05-27 LAB — BASIC METABOLIC PANEL
BUN / CREAT RATIO: 14 (ref 11–26)
BUN: 14 mg/dL (ref 8–27)
CO2: 31 mmol/L — ABNORMAL HIGH (ref 18–29)
Calcium: 9.8 mg/dL (ref 8.7–10.3)
Chloride: 91 mmol/L — ABNORMAL LOW (ref 97–108)
Creatinine, Ser: 1.01 mg/dL — ABNORMAL HIGH (ref 0.57–1.00)
GFR, EST AFRICAN AMERICAN: 57 mL/min/{1.73_m2} — AB (ref >59)
GFR, EST NON AFRICAN AMERICAN: 49 mL/min/{1.73_m2} — AB (ref >59)
Glucose: 98 mg/dL (ref 65–99)
Potassium: 3.6 mmol/L (ref 3.5–5.2)
SODIUM: 139 mmol/L (ref 134–144)

## 2014-06-13 DIAGNOSIS — Z853 Personal history of malignant neoplasm of breast: Secondary | ICD-10-CM | POA: Diagnosis not present

## 2014-06-13 DIAGNOSIS — R32 Unspecified urinary incontinence: Secondary | ICD-10-CM | POA: Diagnosis not present

## 2014-06-13 DIAGNOSIS — Z9981 Dependence on supplemental oxygen: Secondary | ICD-10-CM | POA: Diagnosis not present

## 2014-06-13 DIAGNOSIS — I4891 Unspecified atrial fibrillation: Secondary | ICD-10-CM | POA: Diagnosis not present

## 2014-06-13 DIAGNOSIS — Z720 Tobacco use: Secondary | ICD-10-CM | POA: Diagnosis not present

## 2014-06-13 DIAGNOSIS — J449 Chronic obstructive pulmonary disease, unspecified: Secondary | ICD-10-CM | POA: Diagnosis not present

## 2014-06-13 DIAGNOSIS — I509 Heart failure, unspecified: Secondary | ICD-10-CM | POA: Diagnosis not present

## 2014-06-13 DIAGNOSIS — F17200 Nicotine dependence, unspecified, uncomplicated: Secondary | ICD-10-CM | POA: Diagnosis not present

## 2014-06-13 DIAGNOSIS — Z8701 Personal history of pneumonia (recurrent): Secondary | ICD-10-CM | POA: Diagnosis not present

## 2014-06-14 NOTE — Telephone Encounter (Signed)
This encounter was created in error - please disregard.

## 2014-06-15 DIAGNOSIS — J449 Chronic obstructive pulmonary disease, unspecified: Secondary | ICD-10-CM | POA: Diagnosis not present

## 2014-06-20 DIAGNOSIS — J449 Chronic obstructive pulmonary disease, unspecified: Secondary | ICD-10-CM | POA: Diagnosis not present

## 2014-06-21 DIAGNOSIS — I10 Essential (primary) hypertension: Secondary | ICD-10-CM | POA: Diagnosis not present

## 2014-06-21 DIAGNOSIS — Z9181 History of falling: Secondary | ICD-10-CM | POA: Diagnosis not present

## 2014-06-21 DIAGNOSIS — J449 Chronic obstructive pulmonary disease, unspecified: Secondary | ICD-10-CM | POA: Diagnosis not present

## 2014-06-21 DIAGNOSIS — J309 Allergic rhinitis, unspecified: Secondary | ICD-10-CM | POA: Diagnosis not present

## 2014-06-21 DIAGNOSIS — L03116 Cellulitis of left lower limb: Secondary | ICD-10-CM | POA: Diagnosis not present

## 2014-06-24 DIAGNOSIS — R05 Cough: Secondary | ICD-10-CM | POA: Diagnosis not present

## 2014-06-24 DIAGNOSIS — J449 Chronic obstructive pulmonary disease, unspecified: Secondary | ICD-10-CM | POA: Diagnosis not present

## 2014-06-24 DIAGNOSIS — J9621 Acute and chronic respiratory failure with hypoxia: Secondary | ICD-10-CM | POA: Diagnosis not present

## 2014-06-24 DIAGNOSIS — I259 Chronic ischemic heart disease, unspecified: Secondary | ICD-10-CM | POA: Diagnosis not present

## 2014-06-28 DIAGNOSIS — Z853 Personal history of malignant neoplasm of breast: Secondary | ICD-10-CM | POA: Diagnosis not present

## 2014-06-28 DIAGNOSIS — F17200 Nicotine dependence, unspecified, uncomplicated: Secondary | ICD-10-CM | POA: Diagnosis not present

## 2014-06-28 DIAGNOSIS — J449 Chronic obstructive pulmonary disease, unspecified: Secondary | ICD-10-CM | POA: Diagnosis not present

## 2014-06-28 DIAGNOSIS — Z720 Tobacco use: Secondary | ICD-10-CM | POA: Diagnosis not present

## 2014-06-28 DIAGNOSIS — Z8701 Personal history of pneumonia (recurrent): Secondary | ICD-10-CM | POA: Diagnosis not present

## 2014-06-28 DIAGNOSIS — I509 Heart failure, unspecified: Secondary | ICD-10-CM | POA: Diagnosis not present

## 2014-06-28 DIAGNOSIS — R32 Unspecified urinary incontinence: Secondary | ICD-10-CM | POA: Diagnosis not present

## 2014-06-28 DIAGNOSIS — Z9981 Dependence on supplemental oxygen: Secondary | ICD-10-CM | POA: Diagnosis not present

## 2014-06-28 DIAGNOSIS — I4891 Unspecified atrial fibrillation: Secondary | ICD-10-CM | POA: Diagnosis not present

## 2014-06-29 DIAGNOSIS — J449 Chronic obstructive pulmonary disease, unspecified: Secondary | ICD-10-CM | POA: Diagnosis not present

## 2014-06-29 DIAGNOSIS — Z9181 History of falling: Secondary | ICD-10-CM | POA: Diagnosis not present

## 2014-06-29 DIAGNOSIS — Z1389 Encounter for screening for other disorder: Secondary | ICD-10-CM | POA: Diagnosis not present

## 2014-06-29 DIAGNOSIS — L0390 Cellulitis, unspecified: Secondary | ICD-10-CM | POA: Diagnosis not present

## 2014-06-29 DIAGNOSIS — R040 Epistaxis: Secondary | ICD-10-CM | POA: Diagnosis not present

## 2014-07-14 ENCOUNTER — Telehealth: Payer: Self-pay | Admitting: Cardiovascular Disease

## 2014-07-14 DIAGNOSIS — Z853 Personal history of malignant neoplasm of breast: Secondary | ICD-10-CM | POA: Diagnosis not present

## 2014-07-14 DIAGNOSIS — I509 Heart failure, unspecified: Secondary | ICD-10-CM | POA: Diagnosis not present

## 2014-07-14 DIAGNOSIS — I4891 Unspecified atrial fibrillation: Secondary | ICD-10-CM | POA: Diagnosis not present

## 2014-07-14 DIAGNOSIS — Z720 Tobacco use: Secondary | ICD-10-CM | POA: Diagnosis not present

## 2014-07-14 DIAGNOSIS — Z9981 Dependence on supplemental oxygen: Secondary | ICD-10-CM | POA: Diagnosis not present

## 2014-07-14 DIAGNOSIS — F17200 Nicotine dependence, unspecified, uncomplicated: Secondary | ICD-10-CM | POA: Diagnosis not present

## 2014-07-14 DIAGNOSIS — Z8701 Personal history of pneumonia (recurrent): Secondary | ICD-10-CM | POA: Diagnosis not present

## 2014-07-14 DIAGNOSIS — J449 Chronic obstructive pulmonary disease, unspecified: Secondary | ICD-10-CM | POA: Diagnosis not present

## 2014-07-14 DIAGNOSIS — R32 Unspecified urinary incontinence: Secondary | ICD-10-CM | POA: Diagnosis not present

## 2014-07-14 NOTE — Telephone Encounter (Signed)
[facility] with [facility] called stating that this is CHF patient  she's having wet cough and with some blood in it More SOB than yesterday Has not put on any weight, maintaining 158  Has not edema in ankle or in stomach. Seems to be okay. She also stated she will call PCP.    But she has taken Furosemide  40 mg 2 a day.   This cough has has been going on since Monday.   Just wanting to let us know. She doesn't know if we should change medication could help. Does not seem pt needs to go ER but she's letting us know before hand

## 2014-07-14 NOTE — Telephone Encounter (Signed)
[facility] with [facility] called stating that this is CHF patient  She stated she thinks her SOB is related to her COPD  she's having wet cough and with some blood in it More SOB than yesterday Has not put on any weight, maintaining 158  Has not edema in ankle or in stomach. Seems to be okay. She also stated she will call PCP.   But she has taken Furosemide 40 mg 2 a day.   This cough has has been going on since Monday.   Just wanting to let us know. She doesn't know if we should change medication could help. Does not seem pt needs to go ER but she's letting us know before hand   Instructed patient to follow up with her PCP  Call us if she needs to be seen by cardiology  Contact EMS if she feels her situation is emergent  Discussed with Dr. Fletcher Anon he agrees with the plan   Informed patients home health nurse

## 2014-07-16 DIAGNOSIS — J449 Chronic obstructive pulmonary disease, unspecified: Secondary | ICD-10-CM | POA: Diagnosis not present

## 2014-07-21 DIAGNOSIS — Z720 Tobacco use: Secondary | ICD-10-CM | POA: Diagnosis not present

## 2014-07-21 DIAGNOSIS — Z9981 Dependence on supplemental oxygen: Secondary | ICD-10-CM | POA: Diagnosis not present

## 2014-07-21 DIAGNOSIS — I509 Heart failure, unspecified: Secondary | ICD-10-CM | POA: Diagnosis not present

## 2014-07-21 DIAGNOSIS — I4891 Unspecified atrial fibrillation: Secondary | ICD-10-CM | POA: Diagnosis not present

## 2014-07-21 DIAGNOSIS — F17200 Nicotine dependence, unspecified, uncomplicated: Secondary | ICD-10-CM | POA: Diagnosis not present

## 2014-07-21 DIAGNOSIS — R32 Unspecified urinary incontinence: Secondary | ICD-10-CM | POA: Diagnosis not present

## 2014-07-21 DIAGNOSIS — Z8701 Personal history of pneumonia (recurrent): Secondary | ICD-10-CM | POA: Diagnosis not present

## 2014-07-21 DIAGNOSIS — J449 Chronic obstructive pulmonary disease, unspecified: Secondary | ICD-10-CM | POA: Diagnosis not present

## 2014-07-21 DIAGNOSIS — Z853 Personal history of malignant neoplasm of breast: Secondary | ICD-10-CM | POA: Diagnosis not present

## 2014-07-25 DIAGNOSIS — J449 Chronic obstructive pulmonary disease, unspecified: Secondary | ICD-10-CM | POA: Diagnosis not present

## 2014-07-25 DIAGNOSIS — J9621 Acute and chronic respiratory failure with hypoxia: Secondary | ICD-10-CM | POA: Diagnosis not present

## 2014-07-25 DIAGNOSIS — R05 Cough: Secondary | ICD-10-CM | POA: Diagnosis not present

## 2014-07-25 DIAGNOSIS — I259 Chronic ischemic heart disease, unspecified: Secondary | ICD-10-CM | POA: Diagnosis not present

## 2014-08-03 DIAGNOSIS — I4891 Unspecified atrial fibrillation: Secondary | ICD-10-CM | POA: Diagnosis not present

## 2014-08-03 DIAGNOSIS — Z853 Personal history of malignant neoplasm of breast: Secondary | ICD-10-CM | POA: Diagnosis not present

## 2014-08-03 DIAGNOSIS — Z720 Tobacco use: Secondary | ICD-10-CM | POA: Diagnosis not present

## 2014-08-03 DIAGNOSIS — I509 Heart failure, unspecified: Secondary | ICD-10-CM | POA: Diagnosis not present

## 2014-08-03 DIAGNOSIS — Z8701 Personal history of pneumonia (recurrent): Secondary | ICD-10-CM | POA: Diagnosis not present

## 2014-08-03 DIAGNOSIS — Z9981 Dependence on supplemental oxygen: Secondary | ICD-10-CM | POA: Diagnosis not present

## 2014-08-03 DIAGNOSIS — F17200 Nicotine dependence, unspecified, uncomplicated: Secondary | ICD-10-CM | POA: Diagnosis not present

## 2014-08-03 DIAGNOSIS — J449 Chronic obstructive pulmonary disease, unspecified: Secondary | ICD-10-CM | POA: Diagnosis not present

## 2014-08-03 DIAGNOSIS — R32 Unspecified urinary incontinence: Secondary | ICD-10-CM | POA: Diagnosis not present

## 2014-08-14 DIAGNOSIS — J449 Chronic obstructive pulmonary disease, unspecified: Secondary | ICD-10-CM | POA: Diagnosis not present

## 2014-08-15 ENCOUNTER — Emergency Department: Payer: Self-pay | Admitting: Emergency Medicine

## 2014-08-15 DIAGNOSIS — S81811A Laceration without foreign body, right lower leg, initial encounter: Secondary | ICD-10-CM | POA: Diagnosis not present

## 2014-08-15 DIAGNOSIS — Z880 Allergy status to penicillin: Secondary | ICD-10-CM | POA: Diagnosis not present

## 2014-08-15 DIAGNOSIS — I10 Essential (primary) hypertension: Secondary | ICD-10-CM | POA: Diagnosis not present

## 2014-08-18 DIAGNOSIS — I509 Heart failure, unspecified: Secondary | ICD-10-CM | POA: Diagnosis not present

## 2014-08-18 DIAGNOSIS — Z8701 Personal history of pneumonia (recurrent): Secondary | ICD-10-CM | POA: Diagnosis not present

## 2014-08-18 DIAGNOSIS — Z720 Tobacco use: Secondary | ICD-10-CM | POA: Diagnosis not present

## 2014-08-18 DIAGNOSIS — Z853 Personal history of malignant neoplasm of breast: Secondary | ICD-10-CM | POA: Diagnosis not present

## 2014-08-18 DIAGNOSIS — R32 Unspecified urinary incontinence: Secondary | ICD-10-CM | POA: Diagnosis not present

## 2014-08-18 DIAGNOSIS — S81811A Laceration without foreign body, right lower leg, initial encounter: Secondary | ICD-10-CM | POA: Diagnosis not present

## 2014-08-18 DIAGNOSIS — I4891 Unspecified atrial fibrillation: Secondary | ICD-10-CM | POA: Diagnosis not present

## 2014-08-18 DIAGNOSIS — Z9981 Dependence on supplemental oxygen: Secondary | ICD-10-CM | POA: Diagnosis not present

## 2014-08-18 DIAGNOSIS — I482 Chronic atrial fibrillation: Secondary | ICD-10-CM | POA: Diagnosis not present

## 2014-08-18 DIAGNOSIS — J449 Chronic obstructive pulmonary disease, unspecified: Secondary | ICD-10-CM | POA: Diagnosis not present

## 2014-08-18 DIAGNOSIS — F17200 Nicotine dependence, unspecified, uncomplicated: Secondary | ICD-10-CM | POA: Diagnosis not present

## 2014-08-19 DIAGNOSIS — J449 Chronic obstructive pulmonary disease, unspecified: Secondary | ICD-10-CM | POA: Diagnosis not present

## 2014-08-23 DIAGNOSIS — J449 Chronic obstructive pulmonary disease, unspecified: Secondary | ICD-10-CM | POA: Diagnosis not present

## 2014-08-23 DIAGNOSIS — R05 Cough: Secondary | ICD-10-CM | POA: Diagnosis not present

## 2014-08-23 DIAGNOSIS — I259 Chronic ischemic heart disease, unspecified: Secondary | ICD-10-CM | POA: Diagnosis not present

## 2014-08-23 DIAGNOSIS — J9621 Acute and chronic respiratory failure with hypoxia: Secondary | ICD-10-CM | POA: Diagnosis not present

## 2014-08-26 ENCOUNTER — Ambulatory Visit: Payer: Medicare Other | Admitting: Cardiovascular Disease

## 2014-08-26 DIAGNOSIS — S81811S Laceration without foreign body, right lower leg, sequela: Secondary | ICD-10-CM | POA: Diagnosis not present

## 2014-08-26 DIAGNOSIS — L03115 Cellulitis of right lower limb: Secondary | ICD-10-CM | POA: Diagnosis not present

## 2014-08-29 DIAGNOSIS — I509 Heart failure, unspecified: Secondary | ICD-10-CM | POA: Diagnosis not present

## 2014-08-29 DIAGNOSIS — I4891 Unspecified atrial fibrillation: Secondary | ICD-10-CM | POA: Diagnosis not present

## 2014-08-29 DIAGNOSIS — Z8701 Personal history of pneumonia (recurrent): Secondary | ICD-10-CM | POA: Diagnosis not present

## 2014-08-29 DIAGNOSIS — Z9981 Dependence on supplemental oxygen: Secondary | ICD-10-CM | POA: Diagnosis not present

## 2014-08-29 DIAGNOSIS — Z720 Tobacco use: Secondary | ICD-10-CM | POA: Diagnosis not present

## 2014-08-29 DIAGNOSIS — Z853 Personal history of malignant neoplasm of breast: Secondary | ICD-10-CM | POA: Diagnosis not present

## 2014-08-29 DIAGNOSIS — R32 Unspecified urinary incontinence: Secondary | ICD-10-CM | POA: Diagnosis not present

## 2014-08-29 DIAGNOSIS — F17200 Nicotine dependence, unspecified, uncomplicated: Secondary | ICD-10-CM | POA: Diagnosis not present

## 2014-08-29 DIAGNOSIS — J449 Chronic obstructive pulmonary disease, unspecified: Secondary | ICD-10-CM | POA: Diagnosis not present

## 2014-08-30 DIAGNOSIS — L03115 Cellulitis of right lower limb: Secondary | ICD-10-CM | POA: Diagnosis not present

## 2014-08-30 DIAGNOSIS — S81811S Laceration without foreign body, right lower leg, sequela: Secondary | ICD-10-CM | POA: Diagnosis not present

## 2014-08-31 DIAGNOSIS — I509 Heart failure, unspecified: Secondary | ICD-10-CM | POA: Diagnosis not present

## 2014-08-31 DIAGNOSIS — I4891 Unspecified atrial fibrillation: Secondary | ICD-10-CM | POA: Diagnosis not present

## 2014-08-31 DIAGNOSIS — Z853 Personal history of malignant neoplasm of breast: Secondary | ICD-10-CM | POA: Diagnosis not present

## 2014-08-31 DIAGNOSIS — Z9981 Dependence on supplemental oxygen: Secondary | ICD-10-CM | POA: Diagnosis not present

## 2014-08-31 DIAGNOSIS — Z8701 Personal history of pneumonia (recurrent): Secondary | ICD-10-CM | POA: Diagnosis not present

## 2014-08-31 DIAGNOSIS — R32 Unspecified urinary incontinence: Secondary | ICD-10-CM | POA: Diagnosis not present

## 2014-08-31 DIAGNOSIS — J449 Chronic obstructive pulmonary disease, unspecified: Secondary | ICD-10-CM | POA: Diagnosis not present

## 2014-08-31 DIAGNOSIS — Z720 Tobacco use: Secondary | ICD-10-CM | POA: Diagnosis not present

## 2014-08-31 DIAGNOSIS — F17200 Nicotine dependence, unspecified, uncomplicated: Secondary | ICD-10-CM | POA: Diagnosis not present

## 2014-09-01 DIAGNOSIS — I509 Heart failure, unspecified: Secondary | ICD-10-CM | POA: Diagnosis not present

## 2014-09-02 DIAGNOSIS — I4891 Unspecified atrial fibrillation: Secondary | ICD-10-CM | POA: Diagnosis not present

## 2014-09-02 DIAGNOSIS — J449 Chronic obstructive pulmonary disease, unspecified: Secondary | ICD-10-CM | POA: Diagnosis not present

## 2014-09-02 DIAGNOSIS — F17200 Nicotine dependence, unspecified, uncomplicated: Secondary | ICD-10-CM | POA: Diagnosis not present

## 2014-09-02 DIAGNOSIS — R32 Unspecified urinary incontinence: Secondary | ICD-10-CM | POA: Diagnosis not present

## 2014-09-02 DIAGNOSIS — Z8701 Personal history of pneumonia (recurrent): Secondary | ICD-10-CM | POA: Diagnosis not present

## 2014-09-02 DIAGNOSIS — Z720 Tobacco use: Secondary | ICD-10-CM | POA: Diagnosis not present

## 2014-09-02 DIAGNOSIS — Z853 Personal history of malignant neoplasm of breast: Secondary | ICD-10-CM | POA: Diagnosis not present

## 2014-09-02 DIAGNOSIS — I509 Heart failure, unspecified: Secondary | ICD-10-CM | POA: Diagnosis not present

## 2014-09-02 DIAGNOSIS — Z9981 Dependence on supplemental oxygen: Secondary | ICD-10-CM | POA: Diagnosis not present

## 2014-09-05 DIAGNOSIS — Z720 Tobacco use: Secondary | ICD-10-CM | POA: Diagnosis not present

## 2014-09-05 DIAGNOSIS — Z8701 Personal history of pneumonia (recurrent): Secondary | ICD-10-CM | POA: Diagnosis not present

## 2014-09-05 DIAGNOSIS — J449 Chronic obstructive pulmonary disease, unspecified: Secondary | ICD-10-CM | POA: Diagnosis not present

## 2014-09-05 DIAGNOSIS — Z853 Personal history of malignant neoplasm of breast: Secondary | ICD-10-CM | POA: Diagnosis not present

## 2014-09-05 DIAGNOSIS — Z9981 Dependence on supplemental oxygen: Secondary | ICD-10-CM | POA: Diagnosis not present

## 2014-09-05 DIAGNOSIS — I509 Heart failure, unspecified: Secondary | ICD-10-CM | POA: Diagnosis not present

## 2014-09-05 DIAGNOSIS — I4891 Unspecified atrial fibrillation: Secondary | ICD-10-CM | POA: Diagnosis not present

## 2014-09-05 DIAGNOSIS — R32 Unspecified urinary incontinence: Secondary | ICD-10-CM | POA: Diagnosis not present

## 2014-09-05 DIAGNOSIS — F17200 Nicotine dependence, unspecified, uncomplicated: Secondary | ICD-10-CM | POA: Diagnosis not present

## 2014-09-07 DIAGNOSIS — Z9981 Dependence on supplemental oxygen: Secondary | ICD-10-CM | POA: Diagnosis not present

## 2014-09-07 DIAGNOSIS — R32 Unspecified urinary incontinence: Secondary | ICD-10-CM | POA: Diagnosis not present

## 2014-09-07 DIAGNOSIS — Z720 Tobacco use: Secondary | ICD-10-CM | POA: Diagnosis not present

## 2014-09-07 DIAGNOSIS — F17200 Nicotine dependence, unspecified, uncomplicated: Secondary | ICD-10-CM | POA: Diagnosis not present

## 2014-09-07 DIAGNOSIS — I4891 Unspecified atrial fibrillation: Secondary | ICD-10-CM | POA: Diagnosis not present

## 2014-09-07 DIAGNOSIS — Z853 Personal history of malignant neoplasm of breast: Secondary | ICD-10-CM | POA: Diagnosis not present

## 2014-09-07 DIAGNOSIS — J449 Chronic obstructive pulmonary disease, unspecified: Secondary | ICD-10-CM | POA: Diagnosis not present

## 2014-09-07 DIAGNOSIS — Z8701 Personal history of pneumonia (recurrent): Secondary | ICD-10-CM | POA: Diagnosis not present

## 2014-09-07 DIAGNOSIS — I509 Heart failure, unspecified: Secondary | ICD-10-CM | POA: Diagnosis not present

## 2014-09-09 ENCOUNTER — Other Ambulatory Visit: Payer: Self-pay | Admitting: Cardiovascular Disease

## 2014-09-09 DIAGNOSIS — L03116 Cellulitis of left lower limb: Secondary | ICD-10-CM | POA: Diagnosis not present

## 2014-09-09 DIAGNOSIS — I10 Essential (primary) hypertension: Secondary | ICD-10-CM | POA: Diagnosis not present

## 2014-09-12 DIAGNOSIS — Z9981 Dependence on supplemental oxygen: Secondary | ICD-10-CM | POA: Diagnosis not present

## 2014-09-12 DIAGNOSIS — Z853 Personal history of malignant neoplasm of breast: Secondary | ICD-10-CM | POA: Diagnosis not present

## 2014-09-12 DIAGNOSIS — Z720 Tobacco use: Secondary | ICD-10-CM | POA: Diagnosis not present

## 2014-09-12 DIAGNOSIS — R32 Unspecified urinary incontinence: Secondary | ICD-10-CM | POA: Diagnosis not present

## 2014-09-12 DIAGNOSIS — I4891 Unspecified atrial fibrillation: Secondary | ICD-10-CM | POA: Diagnosis not present

## 2014-09-12 DIAGNOSIS — J449 Chronic obstructive pulmonary disease, unspecified: Secondary | ICD-10-CM | POA: Diagnosis not present

## 2014-09-12 DIAGNOSIS — F17200 Nicotine dependence, unspecified, uncomplicated: Secondary | ICD-10-CM | POA: Diagnosis not present

## 2014-09-12 DIAGNOSIS — Z8701 Personal history of pneumonia (recurrent): Secondary | ICD-10-CM | POA: Diagnosis not present

## 2014-09-12 DIAGNOSIS — I509 Heart failure, unspecified: Secondary | ICD-10-CM | POA: Diagnosis not present

## 2014-09-14 DIAGNOSIS — Z853 Personal history of malignant neoplasm of breast: Secondary | ICD-10-CM | POA: Diagnosis not present

## 2014-09-14 DIAGNOSIS — J449 Chronic obstructive pulmonary disease, unspecified: Secondary | ICD-10-CM | POA: Diagnosis not present

## 2014-09-14 DIAGNOSIS — I509 Heart failure, unspecified: Secondary | ICD-10-CM | POA: Diagnosis not present

## 2014-09-14 DIAGNOSIS — Z9981 Dependence on supplemental oxygen: Secondary | ICD-10-CM | POA: Diagnosis not present

## 2014-09-14 DIAGNOSIS — F17200 Nicotine dependence, unspecified, uncomplicated: Secondary | ICD-10-CM | POA: Diagnosis not present

## 2014-09-14 DIAGNOSIS — I4891 Unspecified atrial fibrillation: Secondary | ICD-10-CM | POA: Diagnosis not present

## 2014-09-14 DIAGNOSIS — Z720 Tobacco use: Secondary | ICD-10-CM | POA: Diagnosis not present

## 2014-09-14 DIAGNOSIS — R32 Unspecified urinary incontinence: Secondary | ICD-10-CM | POA: Diagnosis not present

## 2014-09-14 DIAGNOSIS — Z8701 Personal history of pneumonia (recurrent): Secondary | ICD-10-CM | POA: Diagnosis not present

## 2014-09-15 ENCOUNTER — Encounter: Payer: Self-pay | Admitting: Cardiovascular Disease

## 2014-09-15 ENCOUNTER — Ambulatory Visit (INDEPENDENT_AMBULATORY_CARE_PROVIDER_SITE_OTHER): Payer: Medicare Other | Admitting: Cardiovascular Disease

## 2014-09-15 VITALS — BP 114/54 | HR 71 | Ht 66.0 in | Wt 159.0 lb

## 2014-09-15 DIAGNOSIS — I10 Essential (primary) hypertension: Secondary | ICD-10-CM

## 2014-09-15 DIAGNOSIS — I482 Chronic atrial fibrillation, unspecified: Secondary | ICD-10-CM

## 2014-09-15 DIAGNOSIS — I5022 Chronic systolic (congestive) heart failure: Secondary | ICD-10-CM | POA: Diagnosis not present

## 2014-09-15 NOTE — Assessment & Plan Note (Signed)
Continue with rate control and long-term anticoagulation. She is tolerating anticoagulation with no bleeding issues.

## 2014-09-15 NOTE — Assessment & Plan Note (Signed)
She reports feeling better when her blood pressure is higher than 750 systolic. I am hesitant to decrease the dose of carvedilol given that might worsen rate control of atrial fibrillation. We might consider switching her to metoprolol in the future.

## 2014-09-15 NOTE — Assessment & Plan Note (Signed)
She appears to be euvolemic on current dose of furosemide. She is not on an ACE inhibitor due to low blood pressure. Labs last visit showed stable renal function.

## 2014-09-15 NOTE — Progress Notes (Signed)
Primary care physician: Dr. Luan Pulling  HPI  This is a pleasant 81 -year-old female who is here today for a followup visit for chronic systolic heart failure due to recurrent stress-induced cardiomyopathy and chronic atrial fibrillation. Previous cardiac catheterization in 2014 showed mild nonobstructive coronary artery disease. She has been doing well overall with stable dyspnea and no chest pain. She does complain of fatigue and the need to take naps during the day.   Allergies  Allergen Reactions  . Penicillins Itching, Swelling and Rash  . Sulfa Antibiotics      Current Outpatient Prescriptions on File Prior to Visit  Medication Sig Dispense Refill  . ADVAIR DISKUS 250-50 MCG/DOSE AEPB 2 puffs daily.     . carvedilol (COREG) 6.25 MG tablet TAKE 1 TABLET (6.25 MG TOTAL) BY MOUTH 2 (TWO) TIMES DAILY WITH A MEAL. 180 tablet 3  . Cranberry-Vitamin C-Probiotic (AZO CRANBERRY PO) Take 2 tablets by mouth daily.    . fluticasone (FLONASE) 50 MCG/ACT nasal spray Place 2 sprays into the nose daily as needed.     . furosemide (LASIX) 40 MG tablet Take 40 mg by mouth 2 (two) times daily.    [name] levalbuterol (XOPENEX HFA) 45 MCG/ACT inhaler Inhale 2 puffs into the lungs every 6 (six) hours as needed for wheezing or shortness of breath.    . loratadine (CLARITIN) 10 MG tablet Take 10 mg by mouth daily.    [name] omeprazole (PRILOSEC) 20 MG capsule Take 20 mg by mouth daily.    [name] PROAIR HFA 108 (90 BASE) MCG/ACT inhaler Inhale 1 puff into the lungs every 4 (four) hours as needed.     . Rivaroxaban (XARELTO) 15 MG TABS tablet TAKE 1 TABLET Y MOUTH EVERY DAY 30 tablet 6  . SALINE NASAL SPRAY NA Place into the nose 2 (two) times daily.    [name] tiotropium (SPIRIVA) 18 MCG inhalation capsule Place 18 mcg into inhaler and inhale daily.    . vitamin B-12 (CYANOCOBALAMIN) 1000 MCG tablet Take 1,000 mcg by mouth daily.     No current facility-administered medications on file prior to visit.     Past Medical  History  Diagnosis Date  . HTN (hypertension)   . COPD (chronic obstructive pulmonary disease)     a. on home O2 nightly.  . Diverticulosis   . Takotsubo cardiomyopathy 10/2012    a. 10/2012: presented w/ cardiogenic shock/systolic CHF/VDRF - EF 55% by cath, 40% by f/u echo - cath with mild nonobstructive CAD. b. Not discharged on ACEI due to hypotension.  . Cardiogenic shock     a. 10/2012 - due to Takotsubo CM.  [name] Respiratory failure     a. On home O2 nightly. b. VDRF 10/2012 - due to CAP/pulm edema in setting of cardiogenic shock/Takotsubo CM.  [name] CAP (community acquired pneumonia)     a. 10/2012 - will need f/u CXR in June 2014 to ensure clearance.  . Esophageal stricture     a. s/p dilitation 10/2012.  [name] Pleural effusion, right     a. s/p thoracentesis 10/28/12.  . Atrial fibrillation     a. Dx 10/2012, rate controlled, started on Xarelto.  . Claudication     a. ABI 10/2012 - R 0.87, L 0.74.  [name] GERD (gastroesophageal reflux disease)   . CKD (chronic kidney disease), stage III   . Breast CA     hx  . Abdominal hernia     chronic  . CAD (coronary artery disease)  a. 10/2012 - presented w/ STEMI felt due to Takotsubo - mild nonobstructive dz by cath.  [name] STEMI (ST elevation myocardial infarction)   . Cellulitis      Past Surgical History  Procedure Laterality Date  . Mastectomy    . Cataract extraction    . Esophagogastroduodenoscopy N/A 11/05/2012    Procedure: ESOPHAGOGASTRODUODENOSCOPY (EGD) with esophageal savary dilatation;  Surgeon: Missy Sabins, MD;  Location: [place];  Service: Endoscopy;  Laterality: N/A;  savary dil...no floro needed  . Bladder surgery    . Left heart catheterization with coronary angiogram N/A 10/24/2012    Procedure: LEFT HEART CATHETERIZATION WITH CORONARY ANGIOGRAM;  Surgeon: Sherren Mocha, MD;  Location: [facility] CATH LAB;  Service: Cardiovascular;  Laterality: N/A;     Family History  Problem Relation Age of Onset  . Family history unknown: Yes      History   Social History  . Marital Status: Widowed    Spouse Name: N/A  . Number of Children: N/A  . Years of Education: N/A   Occupational History  . Not on file.   Social History Main Topics  . Smoking status: Never Smoker   . Smokeless tobacco: Never Used  . Alcohol Use: No  . Drug Use: No  . Sexual Activity: No   Other Topics Concern  . Not on file   Social History Narrative     PHYSICAL EXAM   BP 114/54 mmHg  Pulse 71  Ht 5\' 6"  (1.676 m)  Wt 159 lb (72.122 kg)  BMI 25.68 kg/m2 Constitutional: She is oriented to person, place, and time. She appears well-developed and well-nourished. No distress.  HENT: No nasal discharge.  Head: Normocephalic and atraumatic.  Eyes: Pupils are equal and round.  Neck: Normal range of motion. Neck supple. No JVD present. No thyromegaly present.  Cardiovascular: Normal rate, irregular rhythm, normal heart sounds. Exam reveals no gallop and no friction rub. No murmur heard.  Pulmonary/Chest: Effort normal . Very few crackles at the base. No stridor. No respiratory distress. She has no wheezes. She has no rales. She exhibits no tenderness.  Abdominal: Soft. Bowel sounds are normal. She exhibits no distension. There is no tenderness. There is no rebound and no guarding.  Musculoskeletal: Normal range of motion. She exhibits trace edema and no tenderness.  Neurological: She is alert and oriented to person, place, and time. Coordination normal.  Skin: There is a small wound on the right shin which has healed nicely. No induration or warmth.  Psychiatric: She has a normal mood and affect. Her behavior is normal. Judgment and thought content normal.     EKG:  Atrial fibrillation  ABNORMAL RHYTHM   ASSESSMENT AND PLAN

## 2014-09-15 NOTE — Patient Instructions (Signed)
Continue same medications.   Your physician wants you to follow-up in: 4 months.  You will receive a reminder letter in the mail two months in advance. If you don't receive a letter, please call our office to schedule the follow-up appointment.

## 2014-09-16 DIAGNOSIS — Z720 Tobacco use: Secondary | ICD-10-CM | POA: Diagnosis not present

## 2014-09-16 DIAGNOSIS — Z9981 Dependence on supplemental oxygen: Secondary | ICD-10-CM | POA: Diagnosis not present

## 2014-09-16 DIAGNOSIS — R32 Unspecified urinary incontinence: Secondary | ICD-10-CM | POA: Diagnosis not present

## 2014-09-16 DIAGNOSIS — I4891 Unspecified atrial fibrillation: Secondary | ICD-10-CM | POA: Diagnosis not present

## 2014-09-16 DIAGNOSIS — J449 Chronic obstructive pulmonary disease, unspecified: Secondary | ICD-10-CM | POA: Diagnosis not present

## 2014-09-16 DIAGNOSIS — F17200 Nicotine dependence, unspecified, uncomplicated: Secondary | ICD-10-CM | POA: Diagnosis not present

## 2014-09-16 DIAGNOSIS — I509 Heart failure, unspecified: Secondary | ICD-10-CM | POA: Diagnosis not present

## 2014-09-16 DIAGNOSIS — Z853 Personal history of malignant neoplasm of breast: Secondary | ICD-10-CM | POA: Diagnosis not present

## 2014-09-16 DIAGNOSIS — Z8701 Personal history of pneumonia (recurrent): Secondary | ICD-10-CM | POA: Diagnosis not present

## 2014-09-19 DIAGNOSIS — J449 Chronic obstructive pulmonary disease, unspecified: Secondary | ICD-10-CM | POA: Diagnosis not present

## 2014-09-20 DIAGNOSIS — R32 Unspecified urinary incontinence: Secondary | ICD-10-CM | POA: Diagnosis not present

## 2014-09-20 DIAGNOSIS — F17200 Nicotine dependence, unspecified, uncomplicated: Secondary | ICD-10-CM | POA: Diagnosis not present

## 2014-09-20 DIAGNOSIS — Z853 Personal history of malignant neoplasm of breast: Secondary | ICD-10-CM | POA: Diagnosis not present

## 2014-09-20 DIAGNOSIS — Z720 Tobacco use: Secondary | ICD-10-CM | POA: Diagnosis not present

## 2014-09-20 DIAGNOSIS — Z8701 Personal history of pneumonia (recurrent): Secondary | ICD-10-CM | POA: Diagnosis not present

## 2014-09-20 DIAGNOSIS — J449 Chronic obstructive pulmonary disease, unspecified: Secondary | ICD-10-CM | POA: Diagnosis not present

## 2014-09-20 DIAGNOSIS — I4891 Unspecified atrial fibrillation: Secondary | ICD-10-CM | POA: Diagnosis not present

## 2014-09-20 DIAGNOSIS — I509 Heart failure, unspecified: Secondary | ICD-10-CM | POA: Diagnosis not present

## 2014-09-20 DIAGNOSIS — Z9981 Dependence on supplemental oxygen: Secondary | ICD-10-CM | POA: Diagnosis not present

## 2014-09-22 DIAGNOSIS — S81801A Unspecified open wound, right lower leg, initial encounter: Secondary | ICD-10-CM | POA: Diagnosis not present

## 2014-09-23 DIAGNOSIS — Z8701 Personal history of pneumonia (recurrent): Secondary | ICD-10-CM | POA: Diagnosis not present

## 2014-09-23 DIAGNOSIS — I509 Heart failure, unspecified: Secondary | ICD-10-CM | POA: Diagnosis not present

## 2014-09-23 DIAGNOSIS — Z720 Tobacco use: Secondary | ICD-10-CM | POA: Diagnosis not present

## 2014-09-23 DIAGNOSIS — F17200 Nicotine dependence, unspecified, uncomplicated: Secondary | ICD-10-CM | POA: Diagnosis not present

## 2014-09-23 DIAGNOSIS — J9621 Acute and chronic respiratory failure with hypoxia: Secondary | ICD-10-CM | POA: Diagnosis not present

## 2014-09-23 DIAGNOSIS — J449 Chronic obstructive pulmonary disease, unspecified: Secondary | ICD-10-CM | POA: Diagnosis not present

## 2014-09-23 DIAGNOSIS — R32 Unspecified urinary incontinence: Secondary | ICD-10-CM | POA: Diagnosis not present

## 2014-09-23 DIAGNOSIS — I4891 Unspecified atrial fibrillation: Secondary | ICD-10-CM | POA: Diagnosis not present

## 2014-09-23 DIAGNOSIS — I259 Chronic ischemic heart disease, unspecified: Secondary | ICD-10-CM | POA: Diagnosis not present

## 2014-09-23 DIAGNOSIS — R05 Cough: Secondary | ICD-10-CM | POA: Diagnosis not present

## 2014-09-23 DIAGNOSIS — Z9981 Dependence on supplemental oxygen: Secondary | ICD-10-CM | POA: Diagnosis not present

## 2014-09-23 DIAGNOSIS — Z853 Personal history of malignant neoplasm of breast: Secondary | ICD-10-CM | POA: Diagnosis not present

## 2014-09-26 DIAGNOSIS — F17200 Nicotine dependence, unspecified, uncomplicated: Secondary | ICD-10-CM | POA: Diagnosis not present

## 2014-09-26 DIAGNOSIS — Z8701 Personal history of pneumonia (recurrent): Secondary | ICD-10-CM | POA: Diagnosis not present

## 2014-09-26 DIAGNOSIS — Z720 Tobacco use: Secondary | ICD-10-CM | POA: Diagnosis not present

## 2014-09-26 DIAGNOSIS — I4891 Unspecified atrial fibrillation: Secondary | ICD-10-CM | POA: Diagnosis not present

## 2014-09-26 DIAGNOSIS — Z853 Personal history of malignant neoplasm of breast: Secondary | ICD-10-CM | POA: Diagnosis not present

## 2014-09-26 DIAGNOSIS — I509 Heart failure, unspecified: Secondary | ICD-10-CM | POA: Diagnosis not present

## 2014-09-26 DIAGNOSIS — Z9981 Dependence on supplemental oxygen: Secondary | ICD-10-CM | POA: Diagnosis not present

## 2014-09-26 DIAGNOSIS — R32 Unspecified urinary incontinence: Secondary | ICD-10-CM | POA: Diagnosis not present

## 2014-09-26 DIAGNOSIS — J449 Chronic obstructive pulmonary disease, unspecified: Secondary | ICD-10-CM | POA: Diagnosis not present

## 2014-09-27 NOTE — Discharge Summary (Signed)
PATIENT NAME:  [name] [name], [name] [name] MR#:  [id number] DATE OF BIRTH:  03-21-1923  DATE OF ADMISSION:  03/27/2012 DATE OF DISCHARGE:  03/30/2012  DISCHARGE DIAGNOSES:  1. Acute on chronic respiratory failure secondary to chronic obstructive pulmonary disease exacerbation and bilateral pneumonia.  2. Accelerated hypertension.  3. Deconditioning.   DISCHARGE MEDICATIONS:  1. Omeprazole 20 mg p.o. daily.  2. Vitamin B12 1 tablet daily.  3. Multivitamin 1 tablet daily.  4. Aspirin 81 mg daily.  5. Osteo Bi Flex 250/200 mg p.o. daily.  6. Levaquin 500 mg p.o. daily for five days.  7. Atenolol has been increased from 25 to 50 mg p.o. daily.  8. Enalapril dose has been increased from 10 to 20 mg daily. 9. Prednisone 20 mg taper as prescribed.  10. Spiriva 18 mcg inhalation daily.  11. Combivent Respimat one puff twice a day.  CONSULTANTS: Physical therapy.   LABS/RADIOLOGIC STUDIES: Troponin less than 0.02. White count 6.7, hemoglobin 12.1, hematocrit 35.9, and platelets 387.   Electrolytes: Sodium 136, potassium 4.4, chloride 98, bicarbonate 32, BUN 12, creatinine 0.76, and glucose 152.   CT of the chest showed no evidence of PE but interstitial densities and areas of parenchymal densities in both lungs suspicious for pneumonia. The patient has no congestive heart failure.   BNP was slightly elevated at 980. Free thyroxine 1.29. TSH 0.277. Hemoglobin A1c 6.4.  LDL 32.   Echocardiogram showed ejection fraction more than 55%.   DISCHARGE VITALS: Temperature 98, pulse 85, respirations 20, blood pressure 131/65, and saturation 95% on room air.   ALLERGIES: Penicillin and sulfa.   HOSPITAL COURSE:  1. This is an 81 year old female patient who came in because of shortness of breath. Her primary doctor is in [place]. The patient could not remember the name, but she is seeing him for the first time. The patient came in because of shortness of breath and some cough. She has oxygen 2 liters at home. Her  blood pressure was 210/95 when she came. She was admitted for pneumonia and also accelerated hypertension. The patient's oxygen saturations are 94% on 2 liters. She also had chest pain and is thought to be secondary to accelerated hypertension. Troponins have been negative. Echocardiogram showed more than 55%. The next day she did not have any chest pain. Her pneumonia was treated with Rocephin and Zithromax. The patient's is documented to have allergy to Rocephin and penicillins, but she did tolerate the Rocephin nicely. The patient's oxygen saturation improved and lungs were clear so discharged home with tapering dose of prednisone and she started Levaquin. The patient finished four days of Rocephin and Zithromax.  2. Accelerated hypertension. Her Enalapril is increased to 20 mg daily and Atenolol increased to 50 mg daily which helped her blood pressure. She is discharged home with the same increased dose of Atenolol and Enalapril. 3. Shortness of breath, which is more acute on chronic respiratory failure probably due to chronic obstructive pulmonary disease flare. So improved with nebulizers and steroids. We added Spiriva and Advair and we gave her prescriptions.  4. Elevated TSH with normal free T4. She can followup with her primary doctor regarding repeat TSH levels. 5. Deconditioning. She was seen by a physical therapist who recommended home health. The           patient refused any home health and also home physical therapy. She says her daughter-in-law takes care of her and she has been able to do her work at home. The patient is discharged  home and she said she does not want any physical therapy at this time.   TIME SPENT ON DISCHARGE PREPARATION: More than 30 minutes.  ____________________________ Epifanio Lesches, MD [id number] D: 03/31/2012 08:40:02 ET T: 03/31/2012 11:03:45 ET JOB#: [id number]  cc: Epifanio Lesches, MD, <Dictator> Epifanio Lesches MD ELECTRONICALLY SIGNED  04/11/2012 22:48

## 2014-09-27 NOTE — H&P (Signed)
PATIENT NAME:  DANNIEL, GRENZ MR#:  [id number] DATE OF BIRTH:  May 23, 1923  DATE OF ADMISSION:  03/27/2012  PRIMARY CARE PHYSICIAN: Somewhere in [place]. The patient is not able to provide her name.  ED REFERRING PHYSICIAN: Dr. Renard Hamper  CHIEF COMPLAINT:  1. Progressive shortness of breath.  2. Chest pain.  3. Possible pneumonia.   HISTORY OF PRESENT ILLNESS: The patient is an 81 year old Caucasian female with history of chronic obstructive pulmonary disease who has been using oxygen only at nighttime over the past few weeks because her breathing, shortness of breath has gotten worse. The patient reports that she has been having shortness of breath for a while now and started to have progressive worsening of her symptoms over the past few days. The patient was seen by her primary care provider last week and was started on p.o. Levaquin which she received five days of. She reports that she could not tolerate any further and so she stopped taking it. She reports that she has not had any wheezing or coughing. The patient came to the ED had a CT scan of the chest per PE protocol which shows bilateral opacities suggesting of possible pneumonia. The patient also came to the ED with the major complaint of having chest pain. She reports that she has been having these " cramping type" of chest pains going on for the past few weeks. She is not sure how long they last or how frequently they occur but today this cramping sensation was  worse. Also today she noticed some chest heaviness. To note, when she arrived here, her blood pressure was 210/95. She otherwise denies any fevers or chills. Her cardiac enzymes in the ED were negative. She otherwise complains of a runny nose and also has been very weak and has not been able to stand. She otherwise denies any abdominal pain, nausea, vomiting, or diarrhea. Denies any urinary frequency, urgency, or hesitancy.   PAST MEDICAL HISTORY:  1. History of chronic obstructive pulmonary  disease. Used to use oxygen as needed. Now using oxygen at night time since she  states that she has been sick.  2. Hypertension.  3. History of breast cancer, status post bilateral mastectomy. Also had chemotherapy and radiation.  4. History of diverticulosis.  5. History of bilateral cataract surgery.  6. Chronic abdominal hernia.   PAST SURGICAL HISTORY: Bladder surgery, status post mastectomy.   ALLERGIES: Penicillin.   CURRENT MEDICATIONS: As an outpatient, she is on aspirin 81 mg 1 tab p.o. daily. Atenolol 25 p.o. daily. Enalapril 10, 1 tab p.o. b.i.d. Levaquin 750 p.o. daily. Multivitamin daily. Omeprazole 20 daily. Osteo Bi-Flex 1 tab p.o. daily. Vitamin B12 1 tab p.o. daily.   SOCIAL HISTORY: She used to smoke but quit a few years ago. No alcohol or drug use. She is retired from [employer].   FAMILY HISTORY: Mother died of stroke at 61. Her mother also had heart problems.   REVIEW OF SYSTEMS.   CONSTITUTIONAL: Denies any fevers. Complains of fatigue, weakness. Chest pain as above. No weight loss. No weight gain.   EYES: No blurred or double vision, has history cataracts. No redness. No inflammation.   ENT: No tinnitus. No ear pain. No hearing loss. No seasonal or year-round allergies. No difficulty swallowing.   RESPIRATORY: Denies any coughing, wheezing, or hemoptysis. Has chronic obstructive pulmonary disease, has chronic dyspnea.   CARDIOVASCULAR: Complains of chest pain described as above. Denies any orthopnea or significant edema, denies any arrhythmia. No syncope.  GI: Complains of chronic constipation and some nausea associated with it. Also complains of abdominal distention from time to time, which she attributes to her abdominal hernia. Denies any rectal bleeding or hemorrhoids.   GU: Denies any dysuria, hematuria, renal colic or frequency.   ENDO: Denies any polyuria, nocturia, or thyroid problems.   HEME/LYMPH: Denies anemia, easy bruisability, or  bleeding.   SKIN: No acne. No rash. No changes in mole, hair or skin.   MUSCULOSKELETAL: Has pain related to osteoarthritis. No gout.   NEUROLOGIC: No  cerebrovascular accident. No transient ischemic attack. No seizures.   PSYCHIATRIC: No anxiety. No insomnia. No ADD.   PHYSICAL EXAMINATION:  VITAL SIGNS: Temperature 98.7, pulse 82, respirations were 24, blood pressure 210/95, O2 94% on 2 liters.   GENERAL: The patient is a debilitated appearing elderly female currently not in any acute distress.   HEENT: Head atraumatic, normocephalic. Pupils equally round, reactive to light and accommodation. There is no conjunctival pallor. No scleral icterus. Nasal exam shows no drainage or ulceration.   OROPHARYNX: Clear without any exudate.   NECK: No thyromegaly. No carotid bruits.   CARDIOVASCULAR: Regular rate and rhythm. No murmurs, rubs, clicks, or gallops. PMI is not displaced.   LUNGS: Diminished breath sounds with occasional wheezing. No use of no accessory muscle.   ABDOMEN: Soft, nontender positive bowel sounds x4. She does have abdominal hernia.   EXTREMITIES: She has trace edema.   NEUROLOGICAL: Cranial nerves II through XII grossly intact. No focal deficits.   LYMPHATICS: No lymph nodes palpable.   VASCULAR: Good DP, PT pulses.   PSYCHIATRIC: Not anxious or depressed.   LABORATORY, DIAGNOSTIC AND RADIOLOGICAL DATA: CT scan of the chest done in the Emergency Department shows no evidence of PE. There are patchy areas of interstitial densities as well as area of confluent parenchymal densities in both lung findings are suspicion for pneumonia and also underlying chronic obstructive pulmonary disease is noted. There is few small pleural effusions on the left posteriorly.. CPK 40 CK-MB 1.0 glucose 152, BUN 12, creatinine 0.76, sodium 136, potassium 4.4, chloride 98, CO2 32. LFTs are normal except albumin of 2.4. WBC 6.7, hemoglobin 12.1, platelet count 387. Troponin less than 0.02.    EKG: Normal sinus rhythm with some PVCs. No ST-T wave changes.   ASSESSMENT AND PLAN: The patient is an 81 year old white female who presents with chest pain worsening shortness of breath.  1. Chest pain, possibly related to accelerated hypertension: At this time will try to control her blood pressure, check serial cardiac enzymes. The patient also may have symptoms related to angina. Will get an echocardiogram of the heart probably would treat this conservatively. If her echo is abnormal and her cardiac enzymes are elevated, then we will get cardiology evaluation.  2. Possible pneumonia based on CT scan of the chest. We will treat with IV Rocephin and azithromycin, discontinue Levaquin since she was already on this for five days.  3. Accelerated hypertension. We will increase atenolol to 50 daily. Change at enalapril to 20 daily. We will do p.r.n. hydralazine.  4. Worsening shortness of breath, likely due to chronic obstructive pulmonary disease acute flare. We will treat her with IV steroids nebulizer, Add Spiriva and Advair to her current regimen.  5. Hyperglycemia. Will check a hemoglobin A1c. 6. Miscellaneous: Will place her on Lovenox for deep vein thrombosis prophylaxis. [name]          TIME SPENT: 40 minutes spent.    ____________________________ Lafonda Mosses.  Posey Pronto, MD shp:ljs D: 03/27/2012 18:03:08 ET T: 03/28/2012 07:29:42 ET JOB#: [id number]  cc: Shakara Tweedy H. Posey Pronto, MD, <Dictator> Alric Seton MD ELECTRONICALLY SIGNED 04/05/2012 15:30

## 2014-09-29 DIAGNOSIS — F17200 Nicotine dependence, unspecified, uncomplicated: Secondary | ICD-10-CM | POA: Diagnosis not present

## 2014-09-29 DIAGNOSIS — I4891 Unspecified atrial fibrillation: Secondary | ICD-10-CM | POA: Diagnosis not present

## 2014-09-29 DIAGNOSIS — J449 Chronic obstructive pulmonary disease, unspecified: Secondary | ICD-10-CM | POA: Diagnosis not present

## 2014-09-29 DIAGNOSIS — I509 Heart failure, unspecified: Secondary | ICD-10-CM | POA: Diagnosis not present

## 2014-09-29 DIAGNOSIS — Z853 Personal history of malignant neoplasm of breast: Secondary | ICD-10-CM | POA: Diagnosis not present

## 2014-09-29 DIAGNOSIS — Z720 Tobacco use: Secondary | ICD-10-CM | POA: Diagnosis not present

## 2014-09-29 DIAGNOSIS — R32 Unspecified urinary incontinence: Secondary | ICD-10-CM | POA: Diagnosis not present

## 2014-09-29 DIAGNOSIS — Z9981 Dependence on supplemental oxygen: Secondary | ICD-10-CM | POA: Diagnosis not present

## 2014-09-29 DIAGNOSIS — Z8701 Personal history of pneumonia (recurrent): Secondary | ICD-10-CM | POA: Diagnosis not present

## 2014-09-30 NOTE — Discharge Summary (Signed)
PATIENT NAME:  [name] [name], [name] [name] MR#:  [id number] DATE OF BIRTH:  [birth year]  DATE OF ADMISSION:  10/24/2012 DATE OF DISCHARGE:  10/24/2012.   ADMISSION DIAGNOSES:  1.  Acute respiratory failure secondary to aspiration pneumonia. 2.  Acute myocardial infarction.  DISCHARGE DIAGNOSES:  1.  ST segment elevation myocardial infarction.  2.  Acute respiratory failure, currently on mechanical ventilation for aspiration pneumonia. 3.  Acute aspiration pneumonia.   CONSULTATIONS:  Cardiology.  Serial troponins, last was 1.40.   CT of the head no evidence of acute or ischemic or hemorrhagic infarcts.   Chest x-ray shows right middle lobe pneumonia secondary to aspiration.   HOSPITAL COURSE:  This is an 81 year old female who was admitted from the ER with acute respiratory failure, found to have an aspiration pneumonia and acute MI. For further details refer to the H and P.  1. ST elevation MI:  It was thought initially the patient did have some slight elevations in the lateral leads versus intraveticular delay, therefore Cardiology was called. I spoke with Cardiology personally about the EKG and concern about acute ischemic changes in the lateral leads. Cardiology did feel the patient does have an ST elevation MI. The patient's family decided on transfer to [facility] for immediate cardiac catheterization. The EKG changes were very subtle.  2. acute respiratory failure secondary to aspiration pneumonia. The patient wasintubated in the ED and she was admitted to the CCU. She should be continued on aspiration precautions and antibiotics for aspiration pneumonia.  3. hyperglycemia:no h istory of diabetes per the medical chart. The patient will need was placed on an insulin drip. This should be followed up.   MEDICATIONS:  1.  Aspirin 300 mg rectally. 2.   Doxycycline 100 mg every 12. 3.  Lovenox 75 mg every 12 hours.  4.  Flovent HFA 2 puffs every 12 hours.  5.  Meropenem 500 mg every 12 hours.  6.   Solu-Medrol 80 mg IV q 6 hours 7.  Protnix  40 mg IV q.12 hour.   DISPOSITION:  The patient is stable for transfer to [facility] for cardiac intervention.   TIME SPENT:  Approximately 45 minutes.    ____________________________ Donell Beers. Benjie Karvonen, MD spm:jm D: 10/24/2012 13:56:49 ET T: 10/24/2012 14:30:34 ET JOB#: [id number]  cc: [name] [name] P. Benjie Karvonen, MD, <Dictator> Donell Beers Rain Wilhide MD ELECTRONICALLY SIGNED 10/26/2012 13:42

## 2014-09-30 NOTE — H&P (Signed)
PATIENT NAME:  [name] [name], [name] [name] MR#:  [id number] DATE OF BIRTH:  [birth year]  DATE OF ADMISSION:  10/24/2012  CHIEF COMPLAINT:  Respiratory failure.   HISTORY OF PRESENT ILLNESS:  This is an 81 year old female who was brought in via EMS. Apparently this morning she woke up at 2:00 and she said that her stomach was on fire. She started vomiting. She called EMS. EMS had an O2 sat of 74%. When she arrived here, her O2 sat was 67%. She was awake for about 4 minutes and then all of a sudden she became unresponsive. She was hypoxic, agitated and, therefore, she was intubated because she was still hypoxic on a nonrebreather, unable to protect her airway. Chest x-ray confirms an aspiration pneumonia. She also has an elevated troponin. I am unable to obtain any information as the patient is now intubated. All of this information comes from the ER physician as well as the nurse.   PAST MEDICAL HISTORY:  According to medical chart:  1.  COPD. 2.  Hypertension.  3.  A history of breast cancer, status post bilateral mastectomy with chemotherapy and radiation.  4.  A history of diverticulosis.  5.  Bilateral cataract surgery.  6.  Chronic abdominal hernia.   PAST SURGICAL HISTORY:  According to medical chart: 1.  Bladder surgery.  2.  Mastectomy.   ALLERGIES:  PENICILLIN AND SULFA.  MEDICATIONS:  Unknown at this time.   SOCIAL HISTORY:  The patient apparently lives by herself. She is very ambulatory and was apparently going to go garden today.   FAMILY HISTORY:  According to medical chart, CVA, coronary artery disease.   REVIEW OF SYSTEMS:  Unable to obtain as the patient is intubated, sedated, on the vent.   PHYSICAL EXAMINATION:  VITAL SIGNS:  Temperature 99.4, pulse is 132, respirations 14, blood pressure 140/86, 90% on current setting, tidal volume 500, PEEP of 8, FiO2 100%.  GENERAL:  The patient is sedated on the vent. ET tube is placed.  HEENT:  Head is atraumatic. Pupils are 4 mm bilaterally.   Mucous membranes not inspected.  NECK:  Reveals no JVD or enlarged thyroid.  CARDIOVASCULAR:  Regular rate and rhythm. No murmurs, gallops, or rubs. PMI is not palpable.  LUNGS:  Anteriorly with some decreased breath sounds on the right side and crackles, and diminished breath sounds with some rhonchi. The chest is symmetrical rise and fall. Symmetrical expansion.  GASTROINTESTINAL:  Bowel sounds are positive. Nontender, nondistended. No hepatosplenomegaly. No rebound or guarding is elicited but the patient is sedated. No ecchymosis.  MUSCULOSKELETAL:  The patient is sedated on the vent.  SKIN:  No rash or lesions. Inspection within normal limits.  NEUROLOGIC:  Unable to do a full neurologic examination as the patient is intubated, sedated, on the vent. Babinski sign is downgoing.  EXTREMITIES:  No edema, or clubbing, cyanosis.   RADIOLOGICAL AND DIAGNOSTIC DATA:  pH 7.21, pCO2 52, pO2 70, 100% on mechanical ventilation assist control, tidal volume 500, PEEP of 5, mechanical rate of 14. CK 112, CPK-MB 6.1. Troponin 1.40. Lipase 156. White blood cells 10.3, hemoglobin 14, hematocrit 43, platelets of 238. Sodium 136, potassium 4.0, chloride 100, bicarb 25, BUN 12, creatinine 1.05, glucose 361, calcium 8.6, bilirubin 0.4, alk phos 77, ALT 62, AST 90, total protein 7.2, albumin is 3.4. Chest x-ray shows aspiration pneumonia.   EKG:  The patient has diffuse T-wave abnormalities. it appears that she may have st elevation in lateral leads, however ER physician believes it  may be intraventicular delay  ASSESSMENT AND PLAN:  An 81 year old female who presented initially with nausea and vomiting and found to be hypoxic with acute respiratory failure and is now intubated for acute respiratory failure, inability to protect airway and aspiration pneumonia.   1.  Acute on chronic respiratory failure. The patient is now intubated on the ventilator with sedation. Currently her O2 was still low so we will increase  the PEEP to 10. Also consult Pulmonary. I will obtain a Pulmonary consult. We will continue Versed and monitor her blood pressure. If needed for blood pressure we will consider Levophed.  2.  Acute myocardial infarction. The patient has an elevated troponin. I have spoken with Cardiology, who will be seeing the patient as soon as possible. It looks on the EKG that she may have an intraventricular delay versus elevation in her lateral leads. I have started Lovenox and aspirin rectally and order an echocardiogram.We will continue to cycle her cardiac enzymes.  3.  Aspiration pneumonia. The patient has a clear aspiration pneumonia on her x-ray. Since she is in the Critical Care Unit, I will order meropenem and doxycycline, Pulmonary consult. Blood and sputum cultures have been ordered.  4.  A history of chronic obstructive pulmonary disease with some mild rhonchi. We will start some steroids and inhalers.  5.  Hyperglycemia. I do not see a history of diabetes in the patient's medical records. I will check a hemoglobin A1c and since the patient is on the ventilator, we will continue with insulin drip.   CODE STATUS:  THE PATIENT IS A FULL CODE STATUS. The patient is critically ill with high risk of cardiopulmonary event including cardiac arrest.   CRITICAL CARE TIME SPENT:  65 minutes.   ____________________________ Donell Beers. Benjie Karvonen, MD spm:jm D: 10/24/2012 10:45:02 ET T: 10/24/2012 15:31:50 ET JOB#: [id number]  cc: Lucile Didonato P. Benjie Karvonen, MD, <Dictator> Raye Sorrow, MD Donell Beers Chananya Canizalez MD ELECTRONICALLY SIGNED 10/26/2012 13:38

## 2014-09-30 NOTE — Discharge Summary (Signed)
PATIENT NAME:  [name] [name], [name] [name] MR#:  [id number] DATE OF BIRTH:  03-Jun-1923  DATE OF ADMISSION:  04/18/2013  DATE OF DISCHARGE:  04/24/2013  CONSULTANT:  Seen in consultation by Dr. Fletcher Anon while in the hospital.   FINAL DIAGNOSES:   1.  Acute respiratory failure secondary to pneumonia.  2.  Pneumonia and COPD exacerbation, Haemophilus parainfluenza, heavy growth in the sputum culture, and rare MRSA.   3.  Elevated troponin. This is not a myocardial infarction. This is demand ischemia from acute respiratory failure.  4. Chronic systolic congestive heart failure with cardiomyopathy. This is not an acute exacerbation of CHF.  5.  Atrial fibrillation.  6.  Hypertension.   MEDICATIONS ON DISCHARGE INCLUDE: Coreg 6.25 mg twice a day. Xarelto 15 mg daily. Omeprazole 20 mg daily. Loratadine 10 mg daily. Losartan 25 mg daily. Lasix 40 mg daily. Spiriva 18 mcg per inhalations daily. Advair Diskus 250/50, 1 inhalation twice a day. Xopenex HFA CFC free 45 mcg per inhalation, 2 puffs every 4 hours as needed for shortness of breath. Vitamin C 500 mg daily. Vitamin B 600 mg daily. Vitamin B12, 500 mcg daily. Cranberry oral capsule 2 capsules daily. Tylenol 325 mg at bedtime. Prednisone 5 mg 4 tablets day one, 3 tablets day two, 2 tablets day three, 1 tablet days four and five. Doxycycline 100 mg 1 tablet twice a day until completion.   INSTRUCTIONS:  Home health physical therapy and nurse. Diet:  Regular consistency. Activity as tolerated. Follow up with Dr. Fletcher Anon in 1 to 2 weeks and Dr. Ellene Route in 1 to 2 weeks.   HISTORY OF PRESENT ILLNESS: The patient was admitted on 04/18/2013, discharged 04/24/2013. The patient came in with shortness of breath. Was admitted with COPD exacerbation, started on Levaquin and IV steroids. Also had an elevated troponin, likely from hypoxia and acute respiratory failure. Laboratory and radiological data during the hospital course included an ABG that showed a pH of 7.41, pCO2 of 49, pO2  of 69, bicarb 31.1, O2 saturation 93.4. Chest x-ray showed no acute infiltrate or edema. EKG showed atrial fibrillation. Chemistry showed a glucose of 145, BUN 10, creatinine 0.67, sodium 133, potassium 5.8, chloride 99, CO2 of 31, calcium 9.5. Liver function tests: AST slightly elevated at 88. Troponin borderline at 0.36. Blood cultures negative. White blood cell count 8.9, H and H 12.9 and 37.4, platelet count of 247. Troponin borderline at 0.55. Urinalysis positive, with leukocyte esterase. Troponin borderline at 0.73. Sputum culture grew out heavy growth of Haemophilus parainfluenzae and rare methicillin- resistant staph aureus. Echocardiogram showed an EF of 20% to 25%, severely decreased global left ventricular systolic function, dilated left and right atrium, mitral valve regurgitation, moderate aortic valve sclerosis. Creatinine upon discharge 1.25. White count upon discharge 11.2.   HOSPITAL COURSE PER PROBLEM LIST:  1.  For the patient's acute respiratory failure secondary to pneumonia, the patient's pulse ox on room air was 95%. The patient does wear oxygen at night, and she does have that at home. The patient's respiratory status had improved since admission.   2. For the patient's pneumonia and COPD exacerbation, patient was started on Levaquin and steroids. Sputum culture grew out Haemophilus parainfluenza, heavy growth, and rare MRSA. The patient was very concerned about the MRSA. I think this is less likely a pathogen. I think it is pneumonia secondary to the Haemophilus, but I did switch the antibiotic to doxycycline, which would cover both organisms. The patient was here for a complete course of high-dose  Levaquin, but I did switch the patient over to doxycycline, and will finish up a course on that to treat the MRSA also.   3.  Elevated troponin. This is not a myocardial infarction. I repeat, this is not a myocardial infarction. This is demand ischemia from acute respiratory failure.   4.   Chronic systolic congestive heart failure, with cardiomyopathy. This is not acute. The patient was euvolemic during the entire hospital stay, kept on usual medications including Coreg, losartan and Lasix.   5.  Atrial fibrillation, rate controlled on Coreg, anticoagulated with Xarelto.   6.  Hypertension. Blood pressure is stable.   Time spent on discharge:  40 minutes.    ____________________________ Tana Conch. Leslye Peer, MD rjw:mr D: 04/24/2013 13:53:19 ET T: 04/24/2013 19:14:39 ET JOB#: [id number]  cc: Tana Conch. Leslye Peer, MD, <Dictator> Raye Sorrow, MD [place] MD ELECTRONICALLY SIGNED 04/27/2013 18:52

## 2014-09-30 NOTE — H&P (Signed)
PATIENT NAME:  [name] [name], PELLEGRINI MR#:  [id number] DATE OF BIRTH:  04/22/1923  DATE OF ADMISSION:  04/18/2013  PRIMARY CARE PHYSICIAN: Dr. Raye Sorrow  CHIEF COMPLAINT: Shortness of breath.   HISTORY OF PRESENT ILLNESS: This is an 81 year old female, who comes in to the hospital complaining of shortness of breath, progressively getting worse over the past week. The patient says that she has shortness of breath even on minimal exertion like talking or even walking to the bathroom. The patient went to see her primary care physician, who put her on a Z-Pak, although despite finishing the Z-Pak, she has not felt any better. She, therefore, came to the ER for further evaluation. The patient admits to a cough, which is productive, with yellow/green sputum. She also admits to chills, but no documented fever. No chest pain. No nausea, no vomiting, no diarrhea. No abdominal pain. No other associated symptoms. The patient presented to the hospital, and was noted to be hypoxic, with room air oxygen saturations in the mid-80s. She was noted to be in COPD exacerbation, also noted to have a mildly elevated troponin of 0.36. Hospitalist services were contacted for further treatment and evaluation.   REVIEW OF SYSTEMS:   CONSTITUTIONAL: No documented fever. No weight gain. No weight loss.  EYES: No blurry or double vision.  ENT: No tinnitus. No postnasal drip. No redness of the oropharynx.  RESPIRATORY: Positive cough. Positive wheeze. No hemoptysis. Positive dyspnea on exertion.  CARDIOVASCULAR: No chest pain. No orthopnea. No palpitations. No syncope.  GASTROINTESTINAL: No nausea, no vomiting. No diarrhea, no abdominal pain. No melena, no hematochezia.  GENITOURINARY: No dysuria or hematuria.  ENDOCRINE: No polyuria, nocturia, heat or cold intolerance.  HEMATOLOGIC: No anemia, no bruising, no bleeding.  INTEGUMENT: No rashes. No lesions.  MUSCULOSKELETAL: No arthritis, no swelling, no gout.  NEUROLOGIC: No  numbness or tingling. No ataxia. No seizure activity.  PSYCHIATRIC: No anxiety, no insomnia. No ADD.   PAST MEDICAL HISTORY: Consistent with COPD, oxygen dependent, hypertension, history of CHF, history of chronic atrial fibrillation, GERD.  ALLERGIES:  PENICILLIN and SULFA DRUGS, which cause hives.   SOCIAL HISTORY: Used to be a smoker. Does have a 30 to 40 pack-year smoking history. No alcohol abuse. No illicit drug abuse. Lives at home with her sister.   FAMILY HISTORY: Both mother and father are deceased. Father died from complications of pneumonia. Mother died from a stroke and also heart disease.   CURRENT MEDICATIONS: Vitamin B12, 500 mcg daily. Vitamin C 5 mg daily. Omeprazole 20 mg daily. Coreg 6.25 mg b.i.d. Claritin 10 mg daily. Xarelto 15 mg daily. Lasix 40 mg daily. Losartan 25 mg daily. Xopenex inhaler 2 puffs q. 4 to 6 hours as needed. Spiriva 1 puff daily. Advair 250/50, 1 puff b.i.d.   PHYSICAL EXAMINATION PRESENTLY IS AS FOLLOWS:  VITAL SIGNS ARE NOTED TO BE: Temperature 97.6, pulse 96, respirations 24, blood pressure 98/69, sats 93% on 2 liters nasal cannula.  GENERAL: The patient is a pleasant-appearing female in no apparent distress.  HEAD, EYES, EARS, NOSE, THROAT EXAM: The patient is atraumatic, normocephalic. Extraocular muscles are intact. Pupils are equal and reactive eye to light. Sclerae are anicteric. No conjunctival injection. No pharyngeal erythema.  NECK: Supple. There is no jugular venous distention. No bruits. No lymphadenopathy or thyromegaly.  HEART EXAM: Irregular. No murmurs, no rubs, no clicks.  LUNGS: She has prolonged inspiratory and expiratory phase. Positive use of accessory muscles. No dullness to percussion. Positive end-expiratory wheezing.  ABDOMEN:  Soft, flat, nontender, nondistended. Has good bowel sounds. No hepatosplenomegaly appreciated.  EXTREMITIES: No evidence of any cyanosis, clubbing, or peripheral edema. Has +2 pedal and radial pulses  bilaterally.  NEUROLOGIC: The patient is alert, awake, oriented x 3, with no focal motor or sensory deficits appreciated bilaterally.  SKIN: Moist, warm, with no rashes appreciated.  LYMPHATIC: There is no cervical or axillary lymphadenopathy.   LABORATORY EXAM:  Showed a serum glucose of 145, BUN 10, creatinine 0.6, sodium 133, potassium is currently pending, chloride 99, bicarb 31. The patient's LFTs are within normal limits. Troponin 0.36. White cell count 8.9, hemoglobin 12.9, hematocrit 37.4, platelet count of 247. ABG showed a pH of 7.41, pCO2 of 49, pO2 of 69, sats 93% on 2 liters nasal cannula. The patient did have a chest x-ray done, which showed no evidence of acute cardiopulmonary disease.   ASSESSMENT AND PLAN: This is an 81 year old female with a history of chronic obstructive pulmonary disease, history of chronic atrial fibrillation, gastroesophageal reflux disease, history of breast cancer, status post bilateral mastectomy, who presents to the hospital due to shortness of breath and cough for the past week, progressively getting worse. The patient has failed outpatient therapy with a Z-Pak. Here with acute COPD exacerbation, and also noted to have an elevated troponin.   1.  Chronic obstructive pulmonary disease exacerbation. This is likely the cause of the patient's shortness of breath and cough. The patient has failed outpatient therapy with a Z-Pak, and has progressively gotten worse. Questionable if this is underlying pneumonia versus acute bronchitis, although patient's chest x-ray does not show any evidence of acute pneumonia. I will treat the patient with IV steroids, continue around-the-clock nebulizer treatments, continue Advair and Spiriva. Also give her empiric Levaquin, follow blood and sputum cultures, and follow her clinically. The patient is already on oxygen at home.   2.  Elevated troponin. This is likely in the setting of demand ischemia from hypoxemia from COPD. The  patient has no acute chest pain. EKG does not show any acute ST changes. I will continue her Coreg for now. Continue her Xarelto. I will get a 2-dimensional echocardiogram. Also get a Cardiology consult. The patient is a patient of Dr. Fletcher Anon, and I will go ahead and consult him. I discussed the case with him.   3.  History of chronic atrial fibrillation. The patient is rate controlled. I will continue the patient's Coreg. Continue Xarelto for anticoagulation.   4. Hypertension. The patient is presently hemodynamically stable. I will continue Coreg, continue losartan.   5.  History of congestive heart failure. Clinically, patient does not appear be in congestive heart failure. I will continue her Coreg, losartan, and Lasix.   6.  Gastroesophageal reflux disease. Continue omeprazole.   The patient is a FULL CODE.   Time spent on admission is 50 minutes.     ____________________________ Belia Heman. Verdell Carmine, MD vjs:mr D: 04/18/2013 15:51:51 ET T: 04/18/2013 16:03:49 ET JOB#: [id number]  cc: Belia Heman. Verdell Carmine, MD, <Dictator> Henreitta Leber MD ELECTRONICALLY SIGNED 04/28/2013 13:47

## 2014-09-30 NOTE — Consult Note (Signed)
diverticulosis:    breast cancer:    COPD:    hypertension:    double mastectomy:   Home Medications: Medication Instructions Status  omeprazole 20 mg oral delayed release capsule 1 cap(s) orally once a day Active  Cranberry - oral tablet 1 tab(s) orally once a day Active  Vitamin B6 100 mg oral tablet 1 tab(s) orally once a day Active  One-A-Day Women Multiple Vitamins with Minerals oral tablet 1 tab(s) orally once a day Active  Probiotic Formula - oral capsule 1 cap(s) orally once a day Active  atenolol 25 mg oral tablet 1 tab(s) orally once a day Active  enalapril 20 mg oral tablet 1 tab(s) orally 2 times a day Active  Spiriva 18 mcg inhalation capsule 1 cap(s) via handihaler once a day. Active  aspirin 81 mg oral tablet, chewable 1 tab(s) orally once a day Active  Vitamin B-12 1000 mcg oral tablet 1 tab(s) orally once a day Active  Vitamin C 500 mg oral tablet 1 tab(s) orally once a day Active   Lab Results: Hepatic:  17-May-14 08:43   Bilirubin, Total 0.4  Alkaline Phosphatase 77  SGPT (ALT) 62  SGOT (AST)  98  Total Protein, Serum 7.2  Albumin, Serum 3.4  Routine Chem:  17-May-14 08:43   Hemoglobin A1c ([facility]) 6.2 (The American Diabetes Association recommends that a primary goal of therapy should be <7% and that physicians should reevaluate the treatment regimen in patients with HbA1c values consistently >8%.)  Glucose, Serum  361  BUN 12  Creatinine (comp) 1.05  Sodium, Serum 136  Potassium, Serum 4.0  Chloride, Serum 100  CO2, Serum 25  Calcium (Total), Serum 8.6  Osmolality (calc) 286  eGFR (African American)  55  eGFR (Non-African American)  47 (eGFR values <64m/min/1.73 m2 may be an indication of chronic kidney disease (CKD). Calculated eGFR is useful in patients with stable renal function. The eGFR calculation will not be reliable in acutely ill patients when serum creatinine is changing rapidly. It is not useful in  patients on dialysis. The eGFR  calculation may not be applicable to patients at the low and high extremes of body sizes, pregnant women, and vegetarians.)  Anion Gap 11  Lipase 156 (Result(s) reported on 24 Oct 2012 at 09:18AM.)  Result Comment TROPONIN - RESULTS VERIFIED BY REPEAT TESTING.  - CALLED RESULT TO SMila MerryAT [id number] - 10/24/12-DAC  - READ-BACK PROCESS PERFORMED.  Result(s) reported on 24 Oct 2012 at 09:56AM.  Cardiac:  17-May-14 08:43   CK, Total 112  CPK-MB, Serum  6.1 (Result(s) reported on 24 Oct 2012 at 09:36AM.)  Troponin I  1.40 (0.00-0.05 0.05 ng/mL or less: NEGATIVE  Repeat testing in 3-6 hrs  if clinically indicated. >0.05 ng/mL: POTENTIAL  MYOCARDIAL INJURY. Repeat  testing in 3-6 hrs if  clinically indicated. NOTE: An increase or decrease  of 30% or more on serial  testing suggests a  clinically important change)  Routine UA:  17-May-14 08:43   Color (UA) Yellow  Clarity (UA) Clear  Glucose (UA) 50 mg/dL  Bilirubin (UA) Negative  Ketones (UA) Negative  Specific Gravity (UA) 1.010  Blood (UA) Negative  pH (UA) 7.0  Protein (UA) 30 mg/dL  Nitrite (UA) Negative  Leukocyte Esterase (UA) Negative (Result(s) reported on 24 Oct 2012 at 09:12AM.)  RBC (UA) 8 /HPF  WBC (UA) <1 /HPF  Bacteria (UA) NONE SEEN  Epithelial Cells (UA) <1 /HPF (Result(s) reported on 24 Oct 2012 at  09:12AM.)  Routine Hem:  17-May-14 08:43   WBC (CBC) 10.3  RBC (CBC) 4.32  Hemoglobin (CBC) 13.9  Hematocrit (CBC) 42.3  Platelet Count (CBC) 238 (Result(s) reported on 24 Oct 2012 at 09:07AM.)  MCV 98  MCH 32.2  MCHC 32.9  RDW  15.0   Radiology Results:  Radiology Results: LabUnknown:    17-May-14 10:14, CT Head Without Contrast  PACS Image  CT:  CT Head Without Contrast  REASON FOR EXAM:    change in ms  COMMENTS:       PROCEDURE: CT  - CT HEAD WITHOUT CONTRAST  - Oct 24 2012 10:14AM     RESULT: Axial noncontrast CT scanning was performed through the brain   with reconstructions at 5 mm  intervals and slice thicknesses. Comparison   is made to the study of September 03 2012.    There is mild age-appropriate diffuse cerebral and cerebellar atrophy.   There is very mild compensatory ventriculomegaly. There is decreased   density in the deep white matter of both cerebralhemispheres consistent   with chronic small vessel ischemic type change. There is no evidence of   an acute intracranial hemorrhage nor of an evolving ischemic infarction.   At bone window settings the observed portions of the paranasal sinuses     and mastoid air cells are clear. There is no evidence of an acute skull   fracture.    IMPRESSION:   1. There is no evidence of an acute ischemic or hemorrhagic infarction.  2. There is no evidence of other intracranial hemorrhage.  3. There are age related atrophic changes and there are findings   consistent with chronic small vessel ischemia.     Dictation Site: 5        Verified By: DAVID A. Martinique, M.D., MD    Penicillin: Unknown  Sulfa drugs: Unknown  Nursing Flowsheets: **Vital Signs.:   17-May-14 11:55  Vital Signs Type Admission  Temperature Temperature (F) 98.5  Celsius 36.9  Temperature Source oral  Pulse Pulse 88  Respirations Respirations 16  Systolic BP Systolic BP 811  Diastolic BP (mmHg) Diastolic BP (mmHg) 572  Mean BP 124  Pulse Ox % Pulse Ox % 100  Pulse Ox Activity Level  At rest  Oxygen Delivery Ventilator Assisted  Nurse Fingerstick (mg/dL) FSBS (fasting range 65-99 mg/dL) 190    12:31  Pulse Pulse 95  Respirations Respirations 16  Systolic BP Systolic BP 95  Diastolic BP (mmHg) Diastolic BP (mmHg) 49  Mean BP 64  Pulse Ox % Pulse Ox % 100  Pulse Ox Activity Level  At rest  Oxygen Delivery Ventilator Assisted  Pulse Ox Heart Rate 94    Present Illness Patient is an 81 yo who presents for SOB Per patients relative she was doing OK yesterdayl.  Went to lunch with church, dinner with friend.  Says she called her at 7 AM  complaining that she had been up all night with burning in stomach, vomiting.  No CP  Complained of SOB  Friend/relative called EMS. Per notes On arrival O2 sat on RA was 60%  Patient cyanotic.  BP 90/60.  unresponsive.  Patient put on NRB then intubated,  Now on 85% FiO2 and pressors (levophed) EKG at 9:30 with Sinus tachycardia  ST elevation V4/V5.  EKG now with SR 89  ST ee   Case History and Physical Exam:  Past Medical Health Hypertension, Diabetes Mellitus   Past Surgical History Mastectomy, bilateral cataract surgery  Family History Coronary Artery Disease   HEENT Other  NCAT   Neck/Nodes Other  JVP is increased; no bruits   Chest/Lungs Other  Relatively clear to ausculation anteriorly   Breasts Other  s/p bilateral mastectomy   Cardiovascular Other  RRR.  S1, S2  No S3  Gr I-II/VI systolic murmur LSB   Abdomen Other  Supple  No bowel sounds   Genitalia Not examined   Rectal Not examined   Neurological Other  Intubated and sedated   Skin Dry  Cool    Impression Patient is an 81 yo with no prior history of CAD  Found hypoxic and unresponsive.   Intubated on pressors CXR with pneumonia.  Initial troponin elevated at 1.4. EKG at 7:48 AM with subtle elevation in V3 only.  Repeate at 8:16 with elevation in V4;  Repeatat 9:29 wih ST eleation V3/V4/V5  NOw EKG with SR with ST elevation in V4-V6  Still with R waves in these leads  I have discussed with daughter in law and grand daughter in law.  NOw about 4-5 hours into event.   Family aware that patient's mortality is high with aggressie therapy as well as standard therapy.  Given that she was so independent they wish to proceed with cath/PTCA  [facility] tx to [facility].  Cath lab notified.,   Electronic Signatures: Dorris Carnes (MD)  (Signed 17-May-14 13:55)  Authored: Significant Events - History, Home Medications, Labs, Radiology Results, Allergies, Vital Signs, General Aspect/Present Illness, History and Physical Exam,  Impression/Plan   Last Updated: 17-May-14 13:55 by Dorris Carnes (MD)

## 2014-09-30 NOTE — Consult Note (Signed)
General Aspect [name] [name] is a 81yo female w/ PMHx s/f Takotsubo's cardiomyopathy, nonobstructive CAD, permanent atrial fibrillation, PAD, COPD and GERD/esophageal stricture (s/p dilitation) who was admitted to [facility] yesterday with acute hypoxic respiratory failure.   She had a fairly complicated course in 06/8297. She was admitted to [facility] after experiencing worsening dyspnea. She was cyanotic on EMS arrival. She was intubated and placed on pressors. Serial EKGs indicated anterolateral ST elevations. She was transported to [facility] for emergent cath. This revealed very mild, nonobstructive CAD, EF 20% and WMAs c/w Takotsubo's CM. She was diagnosed with cardiogenic shock. CXR supported CAP and she was placed on empiric abx. She was weaned off pressors and extubated the following day. She was diuresed 15L. She had developed R pleural effusion s/p thoracentesis w/ improvement. Follow-up echo showed EF 40%. She had significant GERD/dysphagia. EGD revealed esophageal stricture s/p dilitation with improvement. She did endorse claudication (ABIs R 0.87, L 0.74). She did have concomitant atrial fibrillation- rate-controlled. She was started on renally-adjusted Xarelto. She was discharged to SNF. She followed up with Dr. Fletcher Anon last month. No further claudication symptoms. No intervention planned. Stable from cardiac standpoint.  She reports experiencing progressively worsening shortness of breath, wheezing and cough productive of yellow/green sputum x 2 weeks. She saw her PCP last week and was started on "allergy medications" and a Z-pack w/o relief. She has felt well since follow-up with Dr. Fletcher Anon otherwise. She has notice chest sorness and feeling "raw" during this time. No relation to exertion. Worse on cough/deep inspiration. No LE edema, weight increase or abdominal distention. She has occasional palpitations. No lightheadedness or syncope. LE discomfort on ambulating has resolved. She does note orthopnea, PND  during this time.   Present Illness In the ED, EKG reveals rate-controlled atrial fibrillation w/o ischemic changes. Initial TnI mildly elevated at 0.36. ABG showed hypercarbia. BMP- K 5.8, Na 133. CXR- no evidence of edema, central mild bronchitic changes. She was admitted by the medicine service for acute hypoxic respirary failure- COPD exacerbation. Started on steroids, nebs, O2. U/a showed 3+ LEs, no nitrates. TnI 0.55, then 0.73. Felt to represent demand ischemia. Cardiology consulted. BMP this AM shows normal K. Na 132. 2D echo ordered and performed this AM.   PAST MEDICAL HISTORY: Consistent with COPD, oxygen dependent, hypertension, history of CHF, history of chronic atrial fibrillation, GERD.  ALLERGIES:  PENICILLIN and SULFA DRUGS, which cause hives.   SOCIAL HISTORY: Used to be a smoker. Does have a 30 to 40 pack-year smoking history. No alcohol abuse. No illicit drug abuse. Lives at home with her sister.   FAMILY HISTORY: Both mother and father are deceased. Father died from complications of pneumonia. Mother died from a stroke and also heart disease.   Physical Exam:  GEN no acute distress   HEENT pink conjunctivae, hearing intact to voice   NECK supple  No masses  trachea midline   RESP normal resp effort  diffuse rhonchi, trace wheezing, no rales   CARD Irregular rate and rhythm  Normal, S1, S2  No murmur   ABD denies tenderness  soft  normal BS   EXTR negative cyanosis/clubbing, negative edema, hyperpirgmention to LEs bilaterally   SKIN normal to palpation, No rashes   NEURO follows commands, motor/sensory function intact   PSYCH alert, A+O to time, place, person   Review of Systems:  Subjective/Chief Complaint shortness of breath   General: Fatigue  Weakness   Respiratory: Wheezing  Sputum   Cardiovascular: Chest pain  or discomfort   Review of Systems: All other systems were reviewed and found to be negative   Home Medications: Medication Instructions  Status  carvedilol 6.25 mg oral tablet 1 tab(s) orally 2 times a day Active  Xarelto 15 mg oral tablet 1 tab(s) orally once a day Active  omeprazole 20 mg oral delayed release capsule 1 cap(s) orally once a day. *do not crush* Active  loratadine 10 mg oral tablet 1 tab(s) orally once a day Active  losartan 25 mg oral tablet 1 tab(s) orally once a day Active  furosemide 40 mg oral tablet 1 tab(s) orally once a day Active  Spiriva 18 mcg inhalation capsule 1 cap(s) via handihaler once a day Active  Advair Diskus 250 mcg-50 mcg inhalation powder 1 puff(s) inhaled 2 times a day Active  Xopenex HFA CFC free 45 mcg/inh inhalation aerosol 2 puff(s) inhaled every 4 hours, As Needed - for Shortness of Breath, for Wheezing  Active  Vitamin C 500 mg oral tablet 1 tab(s) orally once a day (in the morning) Active  Vitamin B6 100 mg oral tablet 1 tab(s) orally once a day Active  Vitamin B12 500 mcg oral tablet 1 tab(s) orally once a day Active  Cranberry - oral capsule 2 cap (8400mg ) orally once a day (in the morning) Active  Tylenol 325 mg oral tablet 1 tab(s) orally once a day (at bedtime) Active   Lab Results:  Routine Chem:  09-Nov-14 14:05   BUN 10  Creatinine (comp) 0.67  Sodium, Serum  133  Potassium, Serum  5.8  10-Nov-14 05:06   BUN 15  Creatinine (comp) 1.06  Sodium, Serum  132  Potassium, Serum 4.2  Cardiac:  09-Nov-14 14:05   Troponin I  0.36 (0.00-0.05 0.05 ng/mL or less: NEGATIVE  Repeat testing in 3-6 hrs  if clinically indicated. >0.05 ng/mL: POTENTIAL  MYOCARDIAL INJURY. Repeat  testing in 3-6 hrs if  clinically indicated. NOTE: An increase or decrease  of 30% or more on serial  testing suggests a  clinically important change)    18:11   Troponin I  0.55 (0.00-0.05 0.05 ng/mL or less: NEGATIVE  Repeat testing in 3-6 hrs  if clinically indicated. >0.05 ng/mL: POTENTIAL  MYOCARDIAL INJURY. Repeat  testing in 3-6 hrs if  clinically indicated. NOTE: An increase or  decrease  of 30% or more on serial  testing suggests a  clinically important change)    22:18   Troponin I  0.73 (0.00-0.05 0.05 ng/mL or less: NEGATIVE  Repeat testing in 3-6 hrs  if clinically indicated. >0.05 ng/mL: POTENTIAL  MYOCARDIAL INJURY. Repeat  testing in 3-6 hrs if  clinically indicated. NOTE: An increase or decrease  of 30% or more on serial  testing suggests a  clinically important change)   EKG:  Interpretation atrial fibrillation, isolated PVC, poor R wave progression, no ST/T changes   Rate 85   EKG Comparision Not changed from  11/2012 tracing   Radiology Results: XRay:    09-Nov-14 13:03, Chest Portable Single View  Chest Portable Single View   REASON FOR EXAM:    difficulty breathing / cogestion/ cough  COMMENTS:       PROCEDURE: DXR - DXR PORTABLE CHEST SINGLE VIEW  - Apr 18 2013  1:03PM     CLINICAL DATA:  Cough, shortness of Breath    EXAM:  PORTABLE CHEST - 1 VIEW    COMPARISON:  10/24/2012    FINDINGS:  Cardiomediastinal silhouette is stable. Central  mild bronchitic  changes. No acute infiltrate or pleural effusion. No pulmonary  edema.     IMPRESSION:  No acute infiltrate or pulmonary edema. Central mild bronchitic  changes.      Electronically Signed    By: Lahoma Crocker M.D.    On: 04/18/2013 13:03         Verified By: Ephraim Hamburger, M.D.,    Penicillin: Unknown  Sulfa drugs: Unknown  Vital Signs/Nurse's Notes: **Vital Signs.:   10-Nov-14 08:00  Vital Signs Type Q 4hr  Temperature Temperature (F) 97.8  Celsius 36.5  Temperature Source oral  Pulse Pulse 90  Pulse source if not from Vital Sign Device per Telemetry Clerk  Respirations Respirations 20  Systolic BP Systolic BP 428  Diastolic BP (mmHg) Diastolic BP (mmHg) 82  Mean BP 97  Pulse Ox % Pulse Ox % 93  Pulse Ox Activity Level  At rest  Oxygen Delivery 2L  Telemetry pattern Cardiac Rhythm Atrial fibrillation; pattern reported by Telemetry Clerk; 54     Impression 81yo female w/ PMHx s/f Takotsubo's cardiomyopathy, nonobstructive CAD, permanent atrial fibrillation, PAD, COPD and GERD/esophageal stricture (s/p dilitation) who was admitted to [facility] yesterday with acute hypoxic respiratory failure.  1. Acute hypoxic respiratory failure Secondary to A/COPD. Fairly euvolemic on exam. On abx, steroids, nebs, O2. Consider mucolytics with cough.  -- Check BNP -- Management per primary team.  2. NSTEMI/nonobstructive CAD Mild troponin elevation likely secondary to hypoxia and COPD exacerbation. Chest discomfort more pleuritic in description. Inflammation on bronchi (worse with cough).  -- Review 2D echo -- Continue cycling troponins (h/o Takotsubo's) -- Start low dose ASA -- Continue BB, statin, NTG SL PRN  3. H/o Takotsubo's cardiomyopathy Denies LE edema. She does note PND, orthopnea. This may overlap with COPD exacerbation. Euvolemic on exam.  -- Review 2D echo -- Check BNP, cycle troponins -- Continue home Lasix, Coreg, ARB  4. Permanent atrial fibrillation Rate-controlled on carvedilol. Maintained on Xarelto. -- Continue Xarelto 15 mg (CrCl 66 yesterday, 41 today) -- Continue carvedilol  5. PAD Stable. Denies claudication. No plan for intervention.  -- Start low-dose ASA   Electronic Signatures for Addendum Section:  Kathlyn Sacramento (MD) (Signed Addendum [phone] 14:00)  The patient was seen and examined. Agree with the above. She is well known to me as outlined above. Similar presentation to the one in May. There is mild JVD. TnI is mildly elevated likely due to supply demand ischemia. Echo showed a drop in EF back to 20-25% with WMAs. She is likely having a recurrent stress induced cardiomyopathy. Treat underlying lung condition. Continue Xarelto for A-fib.   Electronic Signatures: Meriel Pica (PA-C)  (Signed [phone] 10:33)  Authored: General Aspect/Present Illness, History and Physical Exam, Review of System, Home  Medications, Labs, EKG , Radiology, Allergies, Vital Signs/Nurse's Notes, Impression/Plan Kathlyn Sacramento (MD)  (Signed 10-Nov-14 14:00)  Co-Signer: General Aspect/Present Illness, History and Physical Exam, Review of System, Home Medications, Labs, EKG , Radiology, Allergies, Vital Signs/Nurse's Notes, Impression/Plan   Last Updated: 10-Nov-14 14:00 by Kathlyn Sacramento (MD)

## 2014-10-01 NOTE — H&P (Signed)
PATIENT NAME:  [name] [name], AINLEY MR#:  [id number] DATE OF BIRTH:  Feb 11, 1923  DATE OF ADMISSION:  03/10/2014  PRIMARY CARE PHYSICIAN: Dr. Luan Pulling  REFERRING EMERGENCY ROOM PHYSICIAN: Dr. Kerman Passey  CHIEF COMPLAINT: Cough and shortness of breath.  HISTORY OF PRESENT ILLNESS: The patient is an 81 year old pleasant female who is presenting to the ED with the chief complaint of cough and shortness of breath for the past 1 to 2 weeks. The patient has a history of COPD and lives on 2 liters of oxygen. Her cough and shortness of breath are progressively getting worse and the patient was seen by her primary care physician 2 days ago and as she was started on a Z-Pak. The patient took the Z-Pak for 2 days, but still she is not feeling better and came into the ED for further evaluation. Here in the ED, the patient was satting 87% on 2 liters of oxygen. On 4 liters she went up to mid 90s. A chest x-ray has revealed right middle lobe interstitial pneumonia COPD. The patient was initially given IV levofloxacin, but subsequently she developed reaction to the levofloxacin regarding which she was discontinued on levofloxacin and the patient was given IV Rocephin. The patient has allergy to penicillin, but she states that it was several years ago, approximately 40 years ago, and she developed a rash at that time. The patient tolerated the first dose of IV Rocephin in the ED with no complications.   PAST MEDICAL HISTORY: COPD, chronic respiratory failure and lives on 2 liters of oxygen, hypertension, history of CHF, chronic atrial fibrillation on Xarelto, GERD, multi-drug resistant organisms.   PAST SURGICAL HISTORY: Bladder surgery and double mastectomy.  ALLERGIES: PENICILLIN AND SULFA. SULFA gives her a rash.  PSYCHOSOCIAL HISTORY: Lives at home, lives alone. Quit smoking several years ago. Denies alcohol or illicit drug usage.   FAMILY HISTORY: Both mother and father are deceased. Father died from complications of  pneumonia and mother deceased from stroke and also heart disease.   REVIEW OF SYSTEMS: CONSTITUTIONAL: Complaining of chills, fatigue and weakness.  EYES: Denies blurry vision, double vision, glaucoma.  ENT: Denies epistaxis or discharge. RESPIRATORY: Complaining of cough. Has chronic history of COPD. Lives on 2 liters of oxygen.  CARDIOVASCULAR: No chest pain, palpitations, or syncope.  GASTROINTESTINAL: Denies nausea, vomiting, diarrhea, abdominal pain.  GENITOURINARY: No dysuria, hematuria.  GYN AND BREASTS: Denies breast mass. Had double mastectomy in the past. No vaginal discharge.  ENDOCRINE: Denies polyuria, nocturia, thyroid problems. HEMATOLOGIC AND LYMPHATIC: No anemia, easy bruising or bleeding. INTEGUMENTARY: No acne, rash, lesions.  MUSCULOSKELETAL: No joint pain in the neck and back. Denies any gout.  NEUROLOGIC: Denies vertigo, ataxia.  PSYCHIATRIC: No ADD, OCD.   PHYSICAL EXAMINATION: VITAL SIGNS: Temperature 97.7, pulse 90 to 98, respirations 20 to 24, blood pressure 97/67, pulse ox 96% on 4 liters of oxygen.  GENERAL APPEARANCE: The patient is a thin-looking female in no apparent distress.  HEENT: Normocephalic, atraumatic. Pupils are equally reacting to light and accommodation. No scleral icterus. No conjunctival injection. No sinus tenderness. Moist mucous membranes. NECK: Supple. No JVD. No thyromegaly. Range of motion is intact.  LUNGS: Right-sided crackles. No wheezing.  HEART: Irregularly irregular. No murmurs.  ABDOMEN: Soft. Bowel sounds are positive in all 4 quadrants. Nontender, nondistended. No hepatosplenomegaly. No masses. NEUROLOGIC: Awake, alert and oriented x3. Cranial nerves II through XII are grossly intact. Motor and sensory are intact. Reflexes are 2+.  EXTREMITIES: No edema, cyanosis, clubbing.  SKIN: Warm  to touch. Normal turgor. No rashes. No lesions.  MUSCULOSKELETAL: No joint effusion, tenderness, or edema.  PSYCHIATRIC: Flat mood and affect.    DIAGNOSTIC DATA: Chest x-ray, 2 views: COPD with superimposed interstitial pneumonia in the right middle lobe. No evidence of pulmonary edema or pleural effusion.  Other significant labs: CBC is normal. WBC 9.2. Urinalysis: Yellow and cloudy appearance, nitrites and leukocyte esterase are negative. CPK-MB 1.6. Troponin 0.36. Glucose 184, BUN, creatinine, sodium and potassium are normal. Chloride 96. CO2 33. Anion gap 8. EGFR greater than 60. Serum osmolality 80. Calcium 8.7. The patient seemed to be having chronically elevated troponin on 17th of May. Troponin was at 1.40 on 17th of May 2014. In November 2014 it was 0.36, 0.55 and 0.73.   ASSESSMENT AND PLAN: An 81 year old female with 1 to 2 week history of cough and worsening of shortness of breath, was seen by her primary care physician, started on Zithromax and she has been using it for the past 2 days, but still feeling not better and feeling feverish and came into the ED. She has chronic respiratory failure, lives on 2 liters of oxygen, and in the ED the patient was found to be hypoxic. Chest x-ray reveals pneumonia.  1.  Acute on chronic hypoxic respiratory failure secondary to right middle lobe interstitial pneumonia with history of chronic obstructive pulmonary disease. Will admit her to telemetry. We will continue her on IV Rocephin and Zithromax. The patient had an allergic reaction to levofloxacin in the ED. That was stopped. We will continue oxygen, 4 liters via nasal cannula, and wean it off once the patient is clinically stable. We will get sputum culture and sensitivity.  2.  Chronic hypoxic respiratory failure secondary to chronic obstructive pulmonary disease. Will provide her with nebulizer treatments.  3.  Chronic atrial fibrillation, rate controlled. Resume her home medications and continue anticoagulant, Xarelto. 4.  Coronary artery disease. The patient denies any chest pain. Troponin is at 0.36, but the patient's troponin seems to  be chronically elevated since 2014. We will trend her cardiac enzymes. The patient is currently asymptomatic.  5.  We will provide her gastrointestinal prophylaxis and deep vein thrombosis prophylaxis is not needed as the patient is on Xarelto.   Plan of care was discussed in detail with the patient and her daughter-in-law at bedside. She is also power of attorney.   TOTAL TIME SPENT: 50 minutes.   ____________________________ Nicholes Mango, MD ag:sb D: 03/10/2014 16:19:47 ET T: 03/10/2014 16:50:29 ET JOB#: [id number]  cc: Nicholes Mango, MD, <Dictator> Arlis Porta., MD Nicholes Mango MD ELECTRONICALLY SIGNED 03/11/2014 13:53

## 2014-10-01 NOTE — Consult Note (Signed)
General Aspect [name] [name] is a 81yo female w/ PMHx s/f Takotsubo's cardiomyopathy, nonobstructive CAD, permanent atrial fibrillation, PAD, COPD lives on 2 liters of oxygen.and GERD/esophageal stricture (s/p dilitation) who was admitted to [facility] yesterday with acute hypoxic respiratory failure.   She reports cough and shortness of breath for the past 1 to 2 weeks.   Her cough and shortness of breath are progressively getting worse and the patient was seen by her primary care physician and started on a Z-Pak. The patient took the Z-Pak for 2 days, but still she is not feeling better and came into the ED for further evaluation.   in the ED, the patient was sating 87% on 2 liters of oxygen. On 4 liters she went up to mid 90s. A chest x-ray has revealed right middle lobe interstitial pneumonia COPD. The patient was initially given IV levofloxacin, but subsequently she developed reaction to the levofloxacin and changed to IV Rocephin.   She had a fairly complicated course in 12/5100. She was admitted to [facility] after experiencing worsening dyspnea. She was cyanotic on EMS arrival. She was intubated and placed on pressors. Serial EKGs indicated anterolateral ST elevations. She was transported to [facility] for emergent cath. This revealed very mild, nonobstructive CAD, EF 20% and WMAs c/w Takotsubo's CM. She was diagnosed with cardiogenic shock. CXR supported CAP and she was placed on empiric abx. She was weaned off pressors and extubated the following day. She was diuresed 15L. She had developed R pleural effusion s/p thoracentesis w/ improvement. Follow-up echo showed EF 40%. She had significant GERD/dysphagia. EGD revealed esophageal stricture s/p dilitation with improvement. She did endorse claudication (ABIs R 0.87, L 0.74). She did have concomitant atrial fibrillation- rate-controlled. She was started on renally-adjusted Xarelto.   She was hospitalized again on November 9th with acute respiratory failure due to  pneumonia and COPD exacerbation. Troponin was mildly elevated. Repeat echocardiogram showed an ejection fraction of 20-25% with wall motion abnormalities suggestive of stress-induced cardiomyopathy. She gradually improved with antibiotics and breathing treatments.      PAST MEDICAL HISTORY:  COPD, chronic respiratory failure and lives on 2 liters of oxygen, hypertension, history of CHF, chronic atrial fibrillation on Xarelto, GERD, multi-drug resistant organisms.   PAST SURGICAL HISTORY:  Bladder surgery and double mastectomy.  ALLERGIES:  PENICILLIN AND SULFA. SULFA gives her a rash.  PSYCHOSOCIAL HISTORY:  Lives at home, lives alone. Quit smoking several years ago. Denies alcohol or illicit drug usage.   FAMILY HISTORY:  Both mother and father are deceased. Father died from complications of pneumonia and mother deceased from stroke and also heart disease.     Multi-drug Resistant Organism (MDRO): Positive culture for MRSA., 19-Apr-2013   diverticulosis:    breast cancer:    COPD:    hypertension:    Bladder Surgery:    double mastectomy:        Admit Diagnosis:   RESPIRATORY DISTRESS COPD.: Onset Date: 12-Mar-2014, Status: Active, Description: RESPIRATORY DISTRESS COPD.  Home Medications: Medication Instructions Status  Azithromycin 5 Day Dose Pack 250 mg oral tablet 2 tab(s) orally once a day on day 1, then 1 tablet daily for 4 days Active  predniSONE 10 mg oral tablet 3 tab(s) orally once a day for 3 days, then 2 tablets daily for 3 days, then 1 tablet daily for 3 days, then stop Active  carvedilol 6.25 mg oral tablet 1 tab(s) orally 2 times a day Active  omeprazole 20 mg oral delayed release capsule  1 cap(s) orally once a day Active  loratadine 10 mg oral tablet 1 tab(s) orally once a day Active  Advair Diskus 250 mcg-50 mcg inhalation powder 1 puff(s) inhaled 2 times a day Active  Vitamin B6 100 mg oral tablet 1 tab(s) orally once a day Active  furosemide 40 mg  oral tablet 1 tab(s) orally once a day (in the morning) Active  furosemide 20 mg oral tablet 1 tab(s) orally once a day (at bedtime) Active  losartan 50 mg oral tablet 1 tab(s) orally once a day Active  Spiriva 18 mcg inhalation capsule 1 each inhaled once a day Active  Vitamin B-12 500 mcg oral tablet 1 tab(s) orally once a day Active  Xarelto 15 mg oral tablet 1 tab(s) orally once a day (at bedtime) Active  ProAir HFA CFC free 90 mcg/inh inhalation aerosol 2 puff(s) inhaled 4 times a day, As Needed - for Shortness of Breath Active   Lab Results:  Routine Chem:  02-Oct-15 04:48   Glucose, Serum  129  BUN 15  Creatinine (comp) 1.03  Sodium, Serum 136  Potassium, Serum 4.2  Chloride, Serum  96  CO2, Serum  38  Calcium (Total), Serum 8.8  Anion Gap  2  Osmolality (calc) 274  eGFR (African American) >60  eGFR (Non-African American)  54 (eGFR values <45m/min/1.73 m2 may be an indication of chronic kidney disease (CKD). Calculated eGFR, using the MRDR Study equation, is useful in  patients with stable renal function. The eGFR calculation will not be reliable in acutely ill patients when serum creatinine is changing rapidly. It is not useful in patients on dialysis. The eGFR calculation may not be applicable to patients at the low and high extremes of body sizes, pregnant women, and vetetarians.)  Routine Hem:  02-Oct-15 04:48   WBC (CBC) 9.3  RBC (CBC) 3.91  Hemoglobin (CBC) 12.6  Hematocrit (CBC) 37.6  Platelet Count (CBC) 215  MCV 96  MCH 32.1  MCHC 33.5  RDW 13.6  Neutrophil % 78.2  Lymphocyte % 14.7  Monocyte % 6.9  Eosinophil % 0.0  Basophil % 0.2  Neutrophil #  7.3  Lymphocyte # 1.4  Monocyte # 0.6  Eosinophil # 0.0  Basophil # 0.0 (Result(s) reported on 11 Mar 2014 at 05:55AM.)   EKG:  Interpretation EKG shows atrial fibrillation rate 86 bpm   Radiology Results: XRay:    01-Oct-15 13:37, Chest PA and Lateral  Chest PA and Lateral   REASON FOR EXAM:     sob, prod cough  COMMENTS:       PROCEDURE: DXR - DXR CHEST PA (OR AP) AND LATERAL  - Mar 10 2014  1:37PM     CLINICAL DATA:  One week history of cough and shortness of breath. ;  recently placed on antibiotics ; on home oxygen; history of CHF,  COPD, and bilateral mastectomy.    EXAM:  CHEST  2 VIEW    COMPARISON:  PA and lateral chest x-ray of March 08, 2014.    FINDINGS:  The lungs are hyperinflated with hemidiaphragm flattening. There are  mildly increased interstitialmarkings at the right lung base  anteriorly new since the previous study. Increased interstitial  markings in the suprahilar regions bilaterally are stable. There is  stable retraction of the left hilar structures superiorly. The  cardiopericardial silhouette is mildly enlarged. The pulmonary  vascularity is not engorged. There are surgical clips in the left  axillary region. The bony thorax exhibits no acute  abnormality.  There is stable wedge compression of approximately L1.     IMPRESSION:  COPD with superimposed interstitial pneumonia in the right middle  lobe. There is no evidence of pulmonary edema nor pleural effusion.    Electronically Signed    By: David  Martinique    On: 03/10/2014 13:44         Verified By: DAVID A. Martinique, M.D., MD    Levaquin: Hives, Itching  Penicillin: Unknown  Sulfa drugs: Unknown  Vital Signs/Nurse's Notes: **Vital Signs.:   03-Oct-15 11:49  Vital Signs Type Q 8hr  Temperature Temperature (F) 97.6  Celsius 36.4  Temperature Source oral  Pulse Pulse 69  Respirations Respirations 20  Systolic BP Systolic BP 798  Diastolic BP (mmHg) Diastolic BP (mmHg) 61  Mean BP 75  Pulse Ox % Pulse Ox % 90  Pulse Ox Activity Level  At rest  Oxygen Delivery 3L    Impression [name] [name] is a 81yo female w/ PMHx s/f Takotsubo's cardiomyopathy, nonobstructive CAD, permanent atrial fibrillation, PAD, COPD lives on 2 liters of oxygen.and GERD/esophageal stricture (s/p dilitation)  who was admitted to [facility] yesterday with acute hypoxic respiratory failure.   1)troponin elevation She has a hx of nonobstructive CAD, cathiac cath in  2014 elevated troponin from hypoxia on arrival, now doing better after nebs, ABX --echo pending No further testing needed (no cath or stress) Continue xarelto and coreg  2) COPD exacerbation/PNA no relief with Zpak (same as 5/14). Seems to have COPD exacerbation 2 to 3 x per year on ABX, nebs, ooxygen, feeling a little better  3) Cardiomyopathy, nonischemia EF down to 20% in 2014, up to 40% in follow up, down again to 20% in late 2014. Euvolemic on arrival, no edema, CXR with no pulm edema, no pleural effusion echo pending  4) Permanent atrial fibrillation Rate-controlled on carvedilol. on Xarelto. -- Continue Xarelto 15 mg  -- Continue carvedilol  5. PAD Stable. Denies claudication. No plan for intervention.  on xarelto   Electronic Signatures: Ida Rogue (MD)  (Signed 03-Oct-15 12:28)  Authored: General Aspect/Present Illness, Past Medical History, Health Issues, Home Medications, Labs, EKG , Radiology, Allergies, Vital Signs/Nurse's Notes, Impression/Plan   Last Updated: 03-Oct-15 12:28 by Ida Rogue (MD)

## 2014-10-01 NOTE — Consult Note (Signed)
Chief Complaint:  Subjective/Chief Complaint Pt with continued dysphagia.  Ready to eat.   Brief Assessment:  GEN well developed, well nourished, no acute distress, A/Ox3   Cardiac Regular   Respiratory normal resp effort   Gastrointestinal Normal   Gastrointestinal details normal Soft  Nontender  Nondistended  No masses palpable  Bowel sounds normal   EXTR negative cyanosis/clubbing, negative edema   Additional Physical Exam Skin: warm, dry, intact, pale   Radiology Results: XRay:    05-Oct-15 15:28, Upper GI  Upper GI   REASON FOR EXAM:    WITH BARIUM PILL re: dysphagia  COMMENTS:       PROCEDURE: FL  - FL UPPER GI  - Mar 14 2014  3:28PM     CLINICAL DATA:  Chronic dysphagia. Prior history of esophageal  dilatation. Shortness of breath. Initial encounter.    EXAM:  UPPER GI SERIES WITHOUT KUB    TECHNIQUE:  Routine upper GI series was performed with thin barium.    FLUOROSCOPY TIME:  3 min 36 seconds.  COMPARISON:  None.    FINDINGS:  This exam was limited due to the patient's shortness of breath. No  focal esophageal mass lesion noted. Tertiary esophageal contractions  scratch with presbyesophagus. A sliding hiatal hernia with a  prominent B ring noted. Endoscopic evaluation is suggested to  exclude other etiologies of esophageal stricture including malignant  etiology. This prominent B ring resulted in significant obstruction  of passage of a standardized 13 mm barium tablet. The patient was  observed until the barium tab dissolved and passed.     IMPRESSION:  Prominence sliding hiatal hernia with prominent B ring. This  resulted in obstruction of passage of a 13 mm standard barium  tablet. Endoscopic evaluation is suggested.      Electronically Signed    By: Marcello Moores  Register    On: 03/14/2014 15:46         Verified By: Osa Craver, M.D., MD   Assessment/Plan:  Assessment/Plan:  Assessment Recurrent esophageal stricture:  Will need EGD  with esophageal dilation with Dr Allen Norris outpatient once respiratory issues resolved   Plan We will arrange Outpatient EGD with dilation with Dr Allen Norris May resume mechanically soft/Dysphagia II diet until dilation Will sign off, Please call if you have any questions or concerns   Electronic Signatures: Andria Meuse (NP)  (Signed 06-Oct-15 12:37)  Authored: Chief Complaint, Brief Assessment, Radiology Results, Assessment/Plan   Last Updated: 06-Oct-15 12:37 by Andria Meuse (NP)

## 2014-10-01 NOTE — Consult Note (Signed)
PATIENT NAME:  VERGINIA, TOOHEY MR#:  [id number] DATE OF BIRTH:  May 25, 1923  DATE OF CONSULTATION:  03/13/2014  CONSULTING PHYSICIAN:  Lucilla Lame, MD, Gastroenterology  REASON FOR CONSULTATION: Dysphagia.   HISTORY OF PRESENT ILLNESS: This patient is an 81 year old woman who was admitted with what appears to be a cough and shortness of breath and patient was diagnosed with pneumonia. The patient also reports that she has had dysphagia for the last few weeks. The patient had an EGD by me approximately a year ago which showed her to have a mild stricture that was dilated up to 18 mm. The patient states that her dysphagia was fine until recently, which has been to both liquids and solids. The patient also has a history of COPD and has been requiring oxygen since admission. I am now asked to see the patient for her dysphagia.   PAST MEDICAL HISTORY: COPD, chronic respiratory failure, hypertension, CHF, chronic atrial fibrillation, GERD.  ALLERGIES: PENICILLIN AND SULFA.   FAMILY HISTORY: Noncontributory.   REVIEW OF SYSTEMS: Shortness of breath, dysphagia, otherwise negative except what was stated in the HPI.   PHYSICAL EXAMINATION: GENERAL: Patient sitting up in bed in no apparent distress, slightly tachypneic.  VITAL SIGNS: Temperature 97.5, pulse 70, respirations 20, blood pressure 126/60, pulse oximetry 94% on 3 liters.  HEENT: Normocephalic, atraumatic. Extraocular motor intact. Pupils equally round and reactive to light and accommodation without JVD, without lymphadenopathy.  LUNGS: With diffuse crackles and rhonchi.  HEART: Regular rate and rhythm without murmurs, rubs, or gallops.  ABDOMEN: Soft, nontender, nondistended, positive bowel sounds without hepatosplenomegaly, without guarding, without rebound.  NEUROLOGICAL: Grossly intact.  EXTREMITIES: Without cyanosis, clubbing, or edema.  SKIN: Warm without any rashes or lesions.   ANCILLARY SERVICES: Hemoglobin 12.2.  ASSESSMENT AND  PLAN: This patient is an 81 year old woman with a history of esophageal strictures who now reports dysphagia. The patient had a mild stricture in the past, and it is unlikely that the patient aspirated due to the mild stricture she had previously, even if it recurred, it did not obstructive the flow of food. The patient will be set up for an esophagogastroduodenoscopy for tomorrow. The patient has been explained the plan and agrees with it.    ____________________________ Lucilla Lame, MD dw:LT D: 03/13/2014 12:15:09 ET T: 03/13/2014 17:10:00 ET JOB#: [id number]  cc: Lucilla Lame, MD, <Dictator> Lucilla Lame MD ELECTRONICALLY SIGNED 03/15/2014 13:42

## 2014-10-01 NOTE — Discharge Summary (Signed)
PATIENT NAME:  [name] [name], [name] [name] MR#:  [id number] DATE OF BIRTH:  April 05, 1923  DATE OF ADMISSION:  03/10/2014 DATE OF DISCHARGE:  03/20/2014  ADDENDUM  Please refer to the interim discharge summary dictated by Dr. Ether Griffins 4 days ago.   DISCHARGE DIAGNOSES: Acute on chronic hypoxic respiratory failure, right middle lobe pneumonia, chronic obstructive pulmonary disease exacerbation, acute on chronic diastolic congestive heart failure, chronic atrial fibrillation, sliding hiatal hernia, coronary artery disease.   CONDITION: Stable.   CODE STATUS: Full code.   HOME MEDICATIONS: Please refer to the medication reconciliation list.   The patient needs home health and physical therapy with nurse. Needs home oxygen 3 liters by nasal cannula.   DIET: Low-sodium, low-fat, low-cholesterol diet.   ACTIVITY: As tolerated.   The patient also needs strict reflux and aspiration precautions.   FOLLOWUP: With Dr. Rockey Situ and PCP within 1-2 weeks. Followup with Dr. Allen Norris as soon as possible in 1 week. In addition, the patient needs followup CAT scan of the chest within 4-6 weeks, may need a bronchoscopy as outpatient.   HOSPITAL COURSE: As mentioned in Dr. Keenan Bachelor interim discharge summary, the patient has acute on chronic hypoxic respiratory failure secondary to right middle lobe pneumonia and also COPD exacerbation, acute on chronic diastolic CHF. The patient has been treated with Zithromax and meropenem for 11 days. The patient's symptoms have much improved. No fever or chills. White count is normal. I will discontinue antibiotics and continue prednisone taper. In addition, the patient needs home oxygen 3 liters by nasal cannula. According to Dr. Mortimer Fries, the patient needs followup CAT scan of the chest within 4-6 weeks after discharge. In addition, the patient may need a bronchoscopy as outpatient.   Acute on chronic diastolic CHF. The patient was treated with Lasix p.o., however, the patient's BUN increased to  50 yesterday. I held Lasix, BUN decreased to 40s, so I will hold Lasix today and the patient may resume Lasix tomorrow after discharge and followup BMP with the PCP or Dr. Rockey Situ.   For COPD, the patient needs to continue patient's home nebulizer medications.   For chronic atrial fibrillation, continue Xarelto.   I discussed the patient's discharge plan with the patient, nurse and case manager. The patient needs home health and PT.   TIME SPENT: About 38 minutes.    ____________________________ Demetrios Loll, MD qc:TT D: 03/20/2014 10:31:53 ET T: 03/20/2014 15:29:57 ET JOB#: [id number]  cc: Demetrios Loll, MD, <Dictator> Demetrios Loll MD ELECTRONICALLY SIGNED 03/21/2014 11:41

## 2014-10-03 DIAGNOSIS — R32 Unspecified urinary incontinence: Secondary | ICD-10-CM | POA: Diagnosis not present

## 2014-10-03 DIAGNOSIS — Z853 Personal history of malignant neoplasm of breast: Secondary | ICD-10-CM | POA: Diagnosis not present

## 2014-10-03 DIAGNOSIS — J449 Chronic obstructive pulmonary disease, unspecified: Secondary | ICD-10-CM | POA: Diagnosis not present

## 2014-10-03 DIAGNOSIS — Z8701 Personal history of pneumonia (recurrent): Secondary | ICD-10-CM | POA: Diagnosis not present

## 2014-10-03 DIAGNOSIS — Z9981 Dependence on supplemental oxygen: Secondary | ICD-10-CM | POA: Diagnosis not present

## 2014-10-03 DIAGNOSIS — I509 Heart failure, unspecified: Secondary | ICD-10-CM | POA: Diagnosis not present

## 2014-10-03 DIAGNOSIS — Z720 Tobacco use: Secondary | ICD-10-CM | POA: Diagnosis not present

## 2014-10-03 DIAGNOSIS — F17200 Nicotine dependence, unspecified, uncomplicated: Secondary | ICD-10-CM | POA: Diagnosis not present

## 2014-10-03 DIAGNOSIS — I4891 Unspecified atrial fibrillation: Secondary | ICD-10-CM | POA: Diagnosis not present

## 2014-10-11 DIAGNOSIS — Z720 Tobacco use: Secondary | ICD-10-CM | POA: Diagnosis not present

## 2014-10-11 DIAGNOSIS — R32 Unspecified urinary incontinence: Secondary | ICD-10-CM | POA: Diagnosis not present

## 2014-10-11 DIAGNOSIS — J449 Chronic obstructive pulmonary disease, unspecified: Secondary | ICD-10-CM | POA: Diagnosis not present

## 2014-10-11 DIAGNOSIS — Z8701 Personal history of pneumonia (recurrent): Secondary | ICD-10-CM | POA: Diagnosis not present

## 2014-10-11 DIAGNOSIS — Z9981 Dependence on supplemental oxygen: Secondary | ICD-10-CM | POA: Diagnosis not present

## 2014-10-11 DIAGNOSIS — F17200 Nicotine dependence, unspecified, uncomplicated: Secondary | ICD-10-CM | POA: Diagnosis not present

## 2014-10-11 DIAGNOSIS — Z853 Personal history of malignant neoplasm of breast: Secondary | ICD-10-CM | POA: Diagnosis not present

## 2014-10-11 DIAGNOSIS — I4891 Unspecified atrial fibrillation: Secondary | ICD-10-CM | POA: Diagnosis not present

## 2014-10-11 DIAGNOSIS — I509 Heart failure, unspecified: Secondary | ICD-10-CM | POA: Diagnosis not present

## 2014-10-13 DIAGNOSIS — S81801A Unspecified open wound, right lower leg, initial encounter: Secondary | ICD-10-CM | POA: Diagnosis not present

## 2014-10-14 DIAGNOSIS — J449 Chronic obstructive pulmonary disease, unspecified: Secondary | ICD-10-CM | POA: Diagnosis not present

## 2014-10-19 DIAGNOSIS — J449 Chronic obstructive pulmonary disease, unspecified: Secondary | ICD-10-CM | POA: Diagnosis not present

## 2014-10-23 DIAGNOSIS — J9621 Acute and chronic respiratory failure with hypoxia: Secondary | ICD-10-CM | POA: Diagnosis not present

## 2014-10-23 DIAGNOSIS — R05 Cough: Secondary | ICD-10-CM | POA: Diagnosis not present

## 2014-10-23 DIAGNOSIS — J449 Chronic obstructive pulmonary disease, unspecified: Secondary | ICD-10-CM | POA: Diagnosis not present

## 2014-10-23 DIAGNOSIS — I259 Chronic ischemic heart disease, unspecified: Secondary | ICD-10-CM | POA: Diagnosis not present

## 2014-10-24 DIAGNOSIS — I4891 Unspecified atrial fibrillation: Secondary | ICD-10-CM | POA: Diagnosis not present

## 2014-10-24 DIAGNOSIS — I509 Heart failure, unspecified: Secondary | ICD-10-CM | POA: Diagnosis not present

## 2014-10-24 DIAGNOSIS — J449 Chronic obstructive pulmonary disease, unspecified: Secondary | ICD-10-CM | POA: Diagnosis not present

## 2014-10-24 DIAGNOSIS — F17200 Nicotine dependence, unspecified, uncomplicated: Secondary | ICD-10-CM | POA: Diagnosis not present

## 2014-10-24 DIAGNOSIS — R32 Unspecified urinary incontinence: Secondary | ICD-10-CM | POA: Diagnosis not present

## 2014-10-24 DIAGNOSIS — Z8701 Personal history of pneumonia (recurrent): Secondary | ICD-10-CM | POA: Diagnosis not present

## 2014-10-24 DIAGNOSIS — Z720 Tobacco use: Secondary | ICD-10-CM | POA: Diagnosis not present

## 2014-10-24 DIAGNOSIS — Z9981 Dependence on supplemental oxygen: Secondary | ICD-10-CM | POA: Diagnosis not present

## 2014-10-24 DIAGNOSIS — Z853 Personal history of malignant neoplasm of breast: Secondary | ICD-10-CM | POA: Diagnosis not present

## 2014-11-10 DIAGNOSIS — I509 Heart failure, unspecified: Secondary | ICD-10-CM | POA: Diagnosis not present

## 2014-11-10 DIAGNOSIS — I4891 Unspecified atrial fibrillation: Secondary | ICD-10-CM | POA: Diagnosis not present

## 2014-11-10 DIAGNOSIS — F17200 Nicotine dependence, unspecified, uncomplicated: Secondary | ICD-10-CM | POA: Diagnosis not present

## 2014-11-10 DIAGNOSIS — Z853 Personal history of malignant neoplasm of breast: Secondary | ICD-10-CM | POA: Diagnosis not present

## 2014-11-10 DIAGNOSIS — Z9981 Dependence on supplemental oxygen: Secondary | ICD-10-CM | POA: Diagnosis not present

## 2014-11-10 DIAGNOSIS — R32 Unspecified urinary incontinence: Secondary | ICD-10-CM | POA: Diagnosis not present

## 2014-11-10 DIAGNOSIS — Z8701 Personal history of pneumonia (recurrent): Secondary | ICD-10-CM | POA: Diagnosis not present

## 2014-11-10 DIAGNOSIS — Z720 Tobacco use: Secondary | ICD-10-CM | POA: Diagnosis not present

## 2014-11-10 DIAGNOSIS — J449 Chronic obstructive pulmonary disease, unspecified: Secondary | ICD-10-CM | POA: Diagnosis not present

## 2014-11-14 DIAGNOSIS — J449 Chronic obstructive pulmonary disease, unspecified: Secondary | ICD-10-CM | POA: Diagnosis not present

## 2014-11-19 DIAGNOSIS — J449 Chronic obstructive pulmonary disease, unspecified: Secondary | ICD-10-CM | POA: Diagnosis not present

## 2014-11-22 ENCOUNTER — Other Ambulatory Visit: Payer: Self-pay | Admitting: Family Medicine

## 2014-11-23 DIAGNOSIS — J9621 Acute and chronic respiratory failure with hypoxia: Secondary | ICD-10-CM | POA: Diagnosis not present

## 2014-11-23 DIAGNOSIS — I259 Chronic ischemic heart disease, unspecified: Secondary | ICD-10-CM | POA: Diagnosis not present

## 2014-11-23 DIAGNOSIS — J449 Chronic obstructive pulmonary disease, unspecified: Secondary | ICD-10-CM | POA: Diagnosis not present

## 2014-11-23 DIAGNOSIS — R05 Cough: Secondary | ICD-10-CM | POA: Diagnosis not present

## 2014-11-29 ENCOUNTER — Other Ambulatory Visit: Payer: Self-pay | Admitting: Family Medicine

## 2014-12-14 DIAGNOSIS — J449 Chronic obstructive pulmonary disease, unspecified: Secondary | ICD-10-CM | POA: Diagnosis not present

## 2014-12-15 ENCOUNTER — Other Ambulatory Visit: Payer: Self-pay | Admitting: Family Medicine

## 2014-12-19 ENCOUNTER — Ambulatory Visit: Payer: Self-pay | Admitting: Family Medicine

## 2014-12-19 DIAGNOSIS — J449 Chronic obstructive pulmonary disease, unspecified: Secondary | ICD-10-CM | POA: Diagnosis not present

## 2014-12-21 ENCOUNTER — Other Ambulatory Visit: Payer: Self-pay

## 2014-12-21 MED ORDER — OMEPRAZOLE 20 MG PO CPDR: 20.0000 mg | DELAYED_RELEASE_CAPSULE | Freq: Every day | ORAL | Status: AC

## 2014-12-21 NOTE — Telephone Encounter (Signed)
Refilled Omeprazole x 12 months after recvd a paper fax. [facility]

## 2014-12-23 ENCOUNTER — Ambulatory Visit (INDEPENDENT_AMBULATORY_CARE_PROVIDER_SITE_OTHER): Payer: Medicare Other | Admitting: Family Medicine

## 2014-12-23 ENCOUNTER — Other Ambulatory Visit: Payer: Self-pay | Admitting: Family Medicine

## 2014-12-23 ENCOUNTER — Encounter: Payer: Self-pay | Admitting: Family Medicine

## 2014-12-23 ENCOUNTER — Ambulatory Visit: Admission: RE | Admit: 2014-12-23 | Payer: Medicare Other | Source: Ambulatory Visit | Admitting: *Deleted

## 2014-12-23 ENCOUNTER — Ambulatory Visit (HOSPITAL_COMMUNITY): Admission: RE | Admit: 2014-12-23 | Payer: Medicare Other | Source: Ambulatory Visit | Admitting: *Deleted

## 2014-12-23 ENCOUNTER — Ambulatory Visit
Admission: RE | Admit: 2014-12-23 | Discharge: 2014-12-23 | Disposition: A | Discharge status: Home / Self Care | Payer: Medicare Other | Source: Ambulatory Visit | Attending: Family Medicine | Admitting: Family Medicine

## 2014-12-23 VITALS — BP 140/60 | HR 70 | Temp 98.1°F | Resp 16 | Wt 158.8 lb

## 2014-12-23 DIAGNOSIS — R600 Localized edema: Secondary | ICD-10-CM | POA: Diagnosis not present

## 2014-12-23 DIAGNOSIS — M79672 Pain in left foot: Secondary | ICD-10-CM

## 2014-12-23 DIAGNOSIS — M85872 Other specified disorders of bone density and structure, left ankle and foot: Secondary | ICD-10-CM | POA: Diagnosis not present

## 2014-12-23 DIAGNOSIS — J9621 Acute and chronic respiratory failure with hypoxia: Secondary | ICD-10-CM | POA: Diagnosis not present

## 2014-12-23 DIAGNOSIS — R609 Edema, unspecified: Secondary | ICD-10-CM | POA: Diagnosis present

## 2014-12-23 DIAGNOSIS — R05 Cough: Secondary | ICD-10-CM | POA: Diagnosis not present

## 2014-12-23 DIAGNOSIS — J449 Chronic obstructive pulmonary disease, unspecified: Secondary | ICD-10-CM | POA: Diagnosis not present

## 2014-12-23 DIAGNOSIS — I10 Essential (primary) hypertension: Secondary | ICD-10-CM

## 2014-12-23 DIAGNOSIS — M19072 Primary osteoarthritis, left ankle and foot: Secondary | ICD-10-CM | POA: Diagnosis not present

## 2014-12-23 DIAGNOSIS — M898X7 Other specified disorders of bone, ankle and foot: Secondary | ICD-10-CM | POA: Diagnosis not present

## 2014-12-23 DIAGNOSIS — I5022 Chronic systolic (congestive) heart failure: Secondary | ICD-10-CM | POA: Diagnosis not present

## 2014-12-23 DIAGNOSIS — I482 Chronic atrial fibrillation, unspecified: Secondary | ICD-10-CM

## 2014-12-23 DIAGNOSIS — I259 Chronic ischemic heart disease, unspecified: Secondary | ICD-10-CM | POA: Diagnosis not present

## 2014-12-23 MED ORDER — FUROSEMIDE 40 MG PO TABS: ORAL_TABLET | ORAL | Status: DC

## 2014-12-23 MED ORDER — CEPHALEXIN 500 MG PO CAPS: 500.0000 mg | ORAL_CAPSULE | Freq: Three times a day (TID) | ORAL | Status: AC

## 2014-12-23 NOTE — Progress Notes (Signed)
Name: [name] [name]   MRN: [id number]    DOB: February 27, 1923   Date:12/23/2014       Progress Note  Subjective  Chief Complaint  Chief Complaint  Patient presents with  . Leg Pain    Patient states that her little dog jumped up on her leg and scratched a placed on her left leg and she states that area is very painful. She states that she has a history of Cellulitis.   . Edema    She states that she has been dealing with this off and on, occurs bilaterally and worse in left than in right. She states that left foot is painful as well.     HPI  L. leg swelling for 1 week.  Started as foot swelling with pain.  Also dog scratched L. Lower leg 2 days ago.  L. Foot red swollen since swelling started.  Skin broken down where scratch is.  Some increased SOB.   Past Medical History  Diagnosis Date  . HTN (hypertension)   . COPD (chronic obstructive pulmonary disease)     a. on home O2 nightly.  . Diverticulosis   . Takotsubo cardiomyopathy 10/2012    a. 10/2012: presented w/ cardiogenic shock/systolic CHF/VDRF - EF 40% by cath, 40% by f/u echo - cath with mild nonobstructive CAD. b. Not discharged on ACEI due to hypotension.  . Cardiogenic shock     a. 10/2012 - due to Takotsubo CM.  [name] Respiratory failure     a. On home O2 nightly. b. VDRF 10/2012 - due to CAP/pulm edema in setting of cardiogenic shock/Takotsubo CM.  [name] CAP (community acquired pneumonia)     a. 10/2012 - will need f/u CXR in June 2014 to ensure clearance.  . Esophageal stricture     a. s/p dilitation 10/2012.  [name] Pleural effusion, right     a. s/p thoracentesis 10/28/12.  . Atrial fibrillation     a. Dx 10/2012, rate controlled, started on Xarelto.  . Claudication     a. ABI 10/2012 - R 0.87, L 0.74.  [name] GERD (gastroesophageal reflux disease)   . CKD (chronic kidney disease), stage III   . Breast CA     hx  . Abdominal hernia     chronic  . CAD (coronary artery disease)     a. 10/2012 - presented w/ STEMI felt due to Takotsubo -  mild nonobstructive dz by cath.  [name] STEMI (ST elevation myocardial infarction)   . Cellulitis     History  Substance Use Topics  . Smoking status: Never Smoker   . Smokeless tobacco: Never Used  . Alcohol Use: No     Current outpatient prescriptions:  .  ADVAIR DISKUS 250-50 MCG/DOSE AEPB, 2 puffs daily. , Disp: , Rfl:  .  albuterol (PROVENTIL) (2.5 MG/3ML) 0.083% nebulizer solution, INHALE THE CONTENTS OF 1 VIAL IN NEBULIZER 4 TIMES A DAY UNTIL FEELING BETTER THEN AS NEEDED, Disp: 75 mL, Rfl: 11 .  carvedilol (COREG) 6.25 MG tablet, TAKE 1 TABLET (6.25 MG TOTAL) BY MOUTH 2 (TWO) TIMES DAILY WITH A MEAL., Disp: 180 tablet, Rfl: 3 .  Cranberry-Vitamin C-Probiotic (AZO CRANBERRY PO), Take 2 tablets by mouth daily., Disp: , Rfl:  .  fluticasone (FLONASE) 50 MCG/ACT nasal spray, Place 2 sprays into the nose daily as needed. , Disp: , Rfl:  .  furosemide (LASIX) 40 MG tablet, Take 40 mg by mouth 2 (two) times daily., Disp: , Rfl:  .  levalbuterol (XOPENEX  HFA) 45 MCG/ACT inhaler, Inhale 2 puffs into the lungs every 6 (six) hours as needed for wheezing or shortness of breath., Disp: , Rfl:  .  loratadine (CLARITIN) 10 MG tablet, Take 10 mg by mouth daily., Disp: , Rfl:  .  Multiple Vitamin (MULTIVITAMIN) tablet, Take 1 tablet by mouth daily., Disp: , Rfl:  .  omeprazole (PRILOSEC) 20 MG capsule, Take 1 capsule (20 mg total) by mouth daily., Disp: 30 capsule, Rfl: 11 .  PROAIR HFA 108 (90 BASE) MCG/ACT inhaler, Inhale 1 puff into the lungs every 4 (four) hours as needed. , Disp: , Rfl:  .  ranitidine (ZANTAC) 150 MG capsule, TAKE 1 CAPSULE BY MOUTH EVERY NIGHT, Disp: 30 capsule, Rfl: 6 .  Rivaroxaban (XARELTO) 15 MG TABS tablet, TAKE 1 TABLET Y MOUTH EVERY DAY, Disp: 30 tablet, Rfl: 6 .  SALINE NASAL SPRAY NA, Place into the nose 2 (two) times daily., Disp: , Rfl:  .  SPIRIVA HANDIHALER 18 MCG inhalation capsule, INHALE 1 INHILATION EVERYDAY, Disp: 30 capsule, Rfl: 12 .  vitamin B-12  (CYANOCOBALAMIN) 1000 MCG tablet, Take 1,000 mcg by mouth daily., Disp: , Rfl:   Allergies  Allergen Reactions  . Penicillins Itching, Swelling and Rash  . Sulfa Antibiotics     Review of Systems  Constitutional: Negative for fever and chills.  Eyes: Negative for blurred vision and double vision.  Respiratory: Positive for shortness of breath and wheezing. Cough: occ.   Cardiovascular: Positive for orthopnea and leg swelling. Negative for chest pain and palpitations.  Gastrointestinal: Negative for abdominal pain and blood in stool.  Genitourinary: Negative for dysuria, urgency and frequency.  Skin: Negative for rash.  Neurological: Negative for dizziness and headaches.      Objective  Filed Vitals:   12/23/14 0818  BP: 157/79  Pulse: 70  Temp: 98.1 F (36.7 C)  Resp: 16  Weight: 158 lb 12.8 oz (72.031 kg)     Physical Exam  Constitutional: She is well-developed, well-nourished, and in no distress.  HENT:  Head: Normocephalic and atraumatic.  Neck: Normal range of motion. Neck supple. No thyromegaly present.  Cardiovascular: Normal rate and normal heart sounds.  An irregularly irregular rhythm present. Exam reveals no gallop and no friction rub.   No murmur heard. Pulmonary/Chest: No respiratory distress.  Decreased breath sounds throughout.  No rales or wheezes today.  Musculoskeletal: She exhibits edema (Trace dedma L. foot.).  L. Foot swolllen with tenderness across maettarsal heads and at 5th digit and L. 5th metatarsal.  Lymphadenopathy:    She has no cervical adenopathy.  Skin:  Small flap abrasion at ant. Mid. Lower leg without signs of cellulitis  Vitals reviewed.     No results found for this or any previous visit (from the past 2160 hour(s)).   Assessment & Plan  1. Pedal edema  - furosemide (LASIX) 40 MG tablet; Take 2 tablets in the AM and 1 tablet in the late afternoon.  Dispense: 90 tablet; Refill: 6  2. Chronic systolic heart failure  -  Basic Metabolic Panel (BMET) - furosemide (LASIX) 40 MG tablet; Take 2 tablets in the AM and 1 tablet in the late afternoon.  Dispense: 90 tablet; Refill: 6  3. Essential hypertension   4. Left foot pain  - DG Foot Complete Left; Future - cephALEXin (KEFLEX) 500 MG capsule; Take 1 capsule (500 mg total) by mouth 3 (three) times daily.  Dispense: 30 capsule; Refill: 0  5. Chronic atrial fibrillation

## 2014-12-24 LAB — BASIC METABOLIC PANEL
BUN/Creatinine Ratio: 16 (ref 11–26)
BUN: 17 mg/dL (ref 10–36)
CALCIUM: 10 mg/dL (ref 8.7–10.3)
CHLORIDE: 93 mmol/L — AB (ref 97–108)
CO2: 30 mmol/L — ABNORMAL HIGH (ref 18–29)
Creatinine, Ser: 1.08 mg/dL — ABNORMAL HIGH (ref 0.57–1.00)
GFR calc Af Amer: 52 mL/min/{1.73_m2} — ABNORMAL LOW (ref >59)
GFR calc non Af Amer: 45 mL/min/{1.73_m2} — ABNORMAL LOW (ref >59)
Glucose: 111 mg/dL — ABNORMAL HIGH (ref 65–99)
Potassium: 4 mmol/L (ref 3.5–5.2)
SODIUM: 140 mmol/L (ref 134–144)

## 2014-12-28 ENCOUNTER — Ambulatory Visit (INDEPENDENT_AMBULATORY_CARE_PROVIDER_SITE_OTHER): Payer: Medicare Other | Admitting: Family Medicine

## 2014-12-28 ENCOUNTER — Encounter: Payer: Self-pay | Admitting: Family Medicine

## 2014-12-28 VITALS — BP 115/57 | HR 76 | Temp 97.6°F | Resp 16 | Ht 63.0 in | Wt 159.6 lb

## 2014-12-28 DIAGNOSIS — M79672 Pain in left foot: Secondary | ICD-10-CM | POA: Diagnosis not present

## 2014-12-28 MED ORDER — GABAPENTIN 100 MG PO CAPS: 100.0000 mg | ORAL_CAPSULE | Freq: Three times a day (TID) | ORAL | Status: DC

## 2014-12-28 NOTE — Progress Notes (Signed)
Name: [name] [name]   MRN: [id number]    DOB: Nov 23, 1922   Date:12/28/2014       Progress Note  Subjective  Chief Complaint  Chief Complaint  Patient presents with  . Foot Injury    Follow Up -Laceration Left leg after dog scrached/ seen 12/23/2014     HPI  For f/u of L. Foot pain.  Still with pain in dorsal L. Foot from metatarsals to lat. Foot.   Pain is sharp and burning and stinging.  Pain has been prfesent for about 1 week.   Past Medical History  Diagnosis Date  . HTN (hypertension)   . COPD (chronic obstructive pulmonary disease)     a. on home O2 nightly.  . Diverticulosis   . Takotsubo cardiomyopathy 10/2012    a. 10/2012: presented w/ cardiogenic shock/systolic CHF/VDRF - EF 38% by cath, 40% by f/u echo - cath with mild nonobstructive CAD. b. Not discharged on ACEI due to hypotension.  . Cardiogenic shock     a. 10/2012 - due to Takotsubo CM.  [name] Respiratory failure     a. On home O2 nightly. b. VDRF 10/2012 - due to CAP/pulm edema in setting of cardiogenic shock/Takotsubo CM.  [name] CAP (community acquired pneumonia)     a. 10/2012 - will need f/u CXR in June 2014 to ensure clearance.  . Esophageal stricture     a. s/p dilitation 10/2012.  [name] Pleural effusion, right     a. s/p thoracentesis 10/28/12.  . Atrial fibrillation     a. Dx 10/2012, rate controlled, started on Xarelto.  . Claudication     a. ABI 10/2012 - R 0.87, L 0.74.  [name] GERD (gastroesophageal reflux disease)   . CKD (chronic kidney disease), stage III   . Breast CA     hx  . Abdominal hernia     chronic  . CAD (coronary artery disease)     a. 10/2012 - presented w/ STEMI felt due to Takotsubo - mild nonobstructive dz by cath.  [name] STEMI (ST elevation myocardial infarction)   . Cellulitis     History  Substance Use Topics  . Smoking status: Never Smoker   . Smokeless tobacco: Never Used  . Alcohol Use: No     Current outpatient prescriptions:  .  ADVAIR DISKUS 250-50 MCG/DOSE AEPB, 2 puffs daily. , Disp: ,  Rfl:  .  albuterol (PROVENTIL) (2.5 MG/3ML) 0.083% nebulizer solution, INHALE THE CONTENTS OF 1 VIAL IN NEBULIZER 4 TIMES A DAY UNTIL FEELING BETTER THEN AS NEEDED, Disp: 75 mL, Rfl: 11 .  carvedilol (COREG) 6.25 MG tablet, TAKE 1 TABLET (6.25 MG TOTAL) BY MOUTH 2 (TWO) TIMES DAILY WITH A MEAL., Disp: 180 tablet, Rfl: 3 .  cephALEXin (KEFLEX) 500 MG capsule, Take 1 capsule (500 mg total) by mouth 3 (three) times daily., Disp: 30 capsule, Rfl: 0 .  Cranberry-Vitamin C-Probiotic (AZO CRANBERRY PO), Take 2 tablets by mouth daily., Disp: , Rfl:  .  fluticasone (FLONASE) 50 MCG/ACT nasal spray, Place 2 sprays into the nose daily as needed. , Disp: , Rfl:  .  furosemide (LASIX) 40 MG tablet, Take 2 tablets in the AM and 1 tablet in the late afternoon., Disp: 90 tablet, Rfl: 6 .  levalbuterol (XOPENEX HFA) 45 MCG/ACT inhaler, Inhale 2 puffs into the lungs every 6 (six) hours as needed for wheezing or shortness of breath., Disp: , Rfl:  .  loratadine (CLARITIN) 10 MG tablet, Take 10 mg by mouth daily.,  Disp: , Rfl:  .  Multiple Vitamin (MULTIVITAMIN) tablet, Take 1 tablet by mouth daily., Disp: , Rfl:  .  omeprazole (PRILOSEC) 20 MG capsule, Take 1 capsule (20 mg total) by mouth daily., Disp: 30 capsule, Rfl: 11 .  PROAIR HFA 108 (90 BASE) MCG/ACT inhaler, Inhale 1 puff into the lungs every 4 (four) hours as needed. , Disp: , Rfl:  .  ranitidine (ZANTAC) 150 MG capsule, TAKE 1 CAPSULE BY MOUTH EVERY NIGHT, Disp: 30 capsule, Rfl: 6 .  Rivaroxaban (XARELTO) 15 MG TABS tablet, TAKE 1 TABLET Y MOUTH EVERY DAY, Disp: 30 tablet, Rfl: 6 .  SALINE NASAL SPRAY NA, Place into the nose 2 (two) times daily., Disp: , Rfl:  .  SPIRIVA HANDIHALER 18 MCG inhalation capsule, INHALE 1 INHILATION EVERYDAY, Disp: 30 capsule, Rfl: 12 .  vitamin B-12 (CYANOCOBALAMIN) 1000 MCG tablet, Take 1,000 mcg by mouth daily., Disp: , Rfl:   Allergies  Allergen Reactions  . Penicillins Itching, Swelling and Rash  . Sulfa Antibiotics      Review of Systems  Constitutional: Negative for fever, chills and malaise/fatigue.  Eyes: Negative for blurred vision and double vision.  Respiratory: Negative for cough, hemoptysis, sputum production, shortness of breath and wheezing.   Cardiovascular: Positive for leg swelling. Negative for chest pain, palpitations and orthopnea.  Gastrointestinal: Negative for abdominal pain and blood in stool.  Genitourinary: Negative for dysuria, urgency and frequency.  Neurological: Negative for weakness and headaches.      Objective  Filed Vitals:   12/28/14 1043  BP: 115/57  Pulse: 76  Temp: 97.6 F (36.4 C)  Resp: 16  Height: 5\' 3"  (1.6 m)  Weight: 159 lb 9.6 oz (72.394 kg)     Physical Exam  Constitutional: She is well-developed, well-nourished, and in no distress. No distress.  Cardiovascular: Normal rate.  An irregularly irregular rhythm present. Exam reveals no gallop and no friction rub.   No murmur heard. Pulses:      Dorsalis pedis pulses are 0 on the left side.       Posterior tibial pulses are 0 on the left side.  Pulmonary/Chest: Effort normal and breath sounds normal. No respiratory distress. She has no wheezes. She has no rales.  Neurological:  Neuropathic pain of L. Foot dorsally.  Pain to palp and dysesthesia.  No bony abnormalities  Skin:  Cellulitis of L. Lower leg has resolved.      Recent Results (from the past 2160 hour(s))  Basic Metabolic Panel (BMET)     Status: Abnormal   Collection Time: 12/23/14 10:42 AM  Result Value Ref Range   Glucose 111 (H) 65 - 99 mg/dL   BUN 17 10 - 36 mg/dL   Creatinine, Ser 1.08 (H) 0.57 - 1.00 mg/dL   GFR calc non Af Amer 45 (L) >59 mL/min/1.73   GFR calc Af Amer 52 (L) >59 mL/min/1.73   BUN/Creatinine Ratio 16 11 - 26   Sodium 140 134 - 144 mmol/L   Potassium 4.0 3.5 - 5.2 mmol/L   Chloride 93 (L) 97 - 108 mmol/L   CO2 30 (H) 18 - 29 mmol/L   Calcium 10.0 8.7 - 10.3 mg/dL     Assessment & Plan  1. Foot  pain, left  - Ambulatory referral to Vascular Surgery - gabapentin (NEURONTIN) 100 MG capsule; Take 1 capsule (100 mg total) by mouth 3 (three) times daily.  Dispense: 90 capsule; Refill: 3

## 2014-12-30 ENCOUNTER — Telehealth: Payer: Self-pay | Admitting: Family Medicine

## 2014-12-30 NOTE — Telephone Encounter (Signed)
Hold the medicine for a day or 2 and then try it immediately after eating supper and always take with food.-jh

## 2014-12-30 NOTE — Telephone Encounter (Signed)
Pt called states that her meds was making her sick (gabapentin). Pt call back # is  [phone].

## 2015-01-03 NOTE — Telephone Encounter (Signed)
Advised and she will start back tonight as she stopped these meds for 1 week. She also asked about Vein and Vasc and I told her we are working on referrals today but to call back if she has not heard by Thurs.[facility]

## 2015-01-14 DIAGNOSIS — J449 Chronic obstructive pulmonary disease, unspecified: Secondary | ICD-10-CM | POA: Diagnosis not present

## 2015-01-18 ENCOUNTER — Other Ambulatory Visit: Payer: Self-pay

## 2015-01-18 MED ORDER — RIVAROXABAN 15 MG PO TABS: ORAL_TABLET | ORAL | Status: DC

## 2015-01-18 NOTE — Telephone Encounter (Signed)
Refill sent for xarelto.

## 2015-01-19 ENCOUNTER — Other Ambulatory Visit: Payer: Self-pay

## 2015-01-19 DIAGNOSIS — J449 Chronic obstructive pulmonary disease, unspecified: Secondary | ICD-10-CM | POA: Diagnosis not present

## 2015-01-19 DIAGNOSIS — M79609 Pain in unspecified limb: Secondary | ICD-10-CM | POA: Diagnosis not present

## 2015-01-19 DIAGNOSIS — I890 Lymphedema, not elsewhere classified: Secondary | ICD-10-CM | POA: Diagnosis not present

## 2015-01-19 DIAGNOSIS — I872 Venous insufficiency (chronic) (peripheral): Secondary | ICD-10-CM | POA: Diagnosis not present

## 2015-01-19 DIAGNOSIS — I8310 Varicose veins of unspecified lower extremity with inflammation: Secondary | ICD-10-CM | POA: Diagnosis not present

## 2015-01-19 DIAGNOSIS — M7989 Other specified soft tissue disorders: Secondary | ICD-10-CM | POA: Diagnosis not present

## 2015-01-19 MED ORDER — RIVAROXABAN 15 MG PO TABS: ORAL_TABLET | ORAL | Status: DC

## 2015-01-19 NOTE — Telephone Encounter (Signed)
Refill xarelto 15 mg

## 2015-01-27 ENCOUNTER — Ambulatory Visit: Payer: Medicare Other | Admitting: Cardiovascular Disease

## 2015-02-02 DIAGNOSIS — M79609 Pain in unspecified limb: Secondary | ICD-10-CM | POA: Diagnosis not present

## 2015-02-02 DIAGNOSIS — I8310 Varicose veins of unspecified lower extremity with inflammation: Secondary | ICD-10-CM | POA: Diagnosis not present

## 2015-02-02 DIAGNOSIS — I70219 Atherosclerosis of native arteries of extremities with intermittent claudication, unspecified extremity: Secondary | ICD-10-CM | POA: Diagnosis not present

## 2015-02-06 ENCOUNTER — Ambulatory Visit: Payer: Medicare Other | Admitting: Family Medicine

## 2015-02-07 ENCOUNTER — Ambulatory Visit
Admission: RE | Admit: 2015-02-07 | Discharge: 2015-02-07 | Disposition: A | Discharge status: Home / Self Care | Payer: Medicare Other | Source: Ambulatory Visit | Attending: Vascular Surgery | Admitting: Vascular Surgery

## 2015-02-07 ENCOUNTER — Encounter
Admission: RE | Disposition: A | Discharge status: Home / Self Care | Payer: Self-pay | Source: Ambulatory Visit | Attending: Vascular Surgery

## 2015-02-07 ENCOUNTER — Encounter: Payer: Self-pay | Admitting: *Deleted

## 2015-02-07 DIAGNOSIS — J449 Chronic obstructive pulmonary disease, unspecified: Secondary | ICD-10-CM | POA: Insufficient documentation

## 2015-02-07 DIAGNOSIS — Z79899 Other long term (current) drug therapy: Secondary | ICD-10-CM | POA: Insufficient documentation

## 2015-02-07 DIAGNOSIS — Z87891 Personal history of nicotine dependence: Secondary | ICD-10-CM | POA: Insufficient documentation

## 2015-02-07 DIAGNOSIS — I509 Heart failure, unspecified: Secondary | ICD-10-CM | POA: Diagnosis not present

## 2015-02-07 DIAGNOSIS — I252 Old myocardial infarction: Secondary | ICD-10-CM | POA: Diagnosis not present

## 2015-02-07 DIAGNOSIS — I70223 Atherosclerosis of native arteries of extremities with rest pain, bilateral legs: Secondary | ICD-10-CM | POA: Insufficient documentation

## 2015-02-07 HISTORY — PX: PERIPHERAL VASCULAR CATHETERIZATION: SHX172C

## 2015-02-07 LAB — CREATININE, SERUM
Creatinine, Ser: 1.25 mg/dL — ABNORMAL HIGH (ref 0.44–1.00)
GFR, EST AFRICAN AMERICAN: 43 mL/min — AB (ref >60)
GFR, EST NON AFRICAN AMERICAN: 37 mL/min — AB (ref >60)

## 2015-02-07 LAB — BUN: BUN: 21 mg/dL — ABNORMAL HIGH (ref 6–20)

## 2015-02-07 LAB — PROTIME-INR
INR: 0.97
PROTHROMBIN TIME: 13.1 s (ref 11.4–15.0)

## 2015-02-07 SURGERY — ABDOMINAL AORTOGRAM W/LOWER EXTREMITY
Wound class: Clean

## 2015-02-07 MED ORDER — FENTANYL CITRATE (PF) 100 MCG/2ML IJ SOLN
INTRAMUSCULAR | Status: AC
  Filled 2015-02-07: qty 2

## 2015-02-07 MED ORDER — SODIUM BICARBONATE BOLUS VIA INFUSION
INTRAVENOUS | Status: AC
  Administered 2015-02-07: 09:00:00 via INTRAVENOUS
  Filled 2015-02-07: qty 1

## 2015-02-07 MED ORDER — MIDAZOLAM HCL 5 MG/5ML IJ SOLN
INTRAMUSCULAR | Status: AC
  Filled 2015-02-07: qty 5

## 2015-02-07 MED ORDER — SODIUM CHLORIDE 0.9 % IJ SOLN
INTRAMUSCULAR | Status: AC
  Filled 2015-02-07: qty 15

## 2015-02-07 MED ORDER — LIDOCAINE-EPINEPHRINE (PF) 1 %-1:200000 IJ SOLN
INTRAMUSCULAR | Status: AC
  Filled 2015-02-07: qty 30

## 2015-02-07 MED ORDER — OXYCODONE HCL 5 MG PO TABS: 5.0000 mg | ORAL_TABLET | ORAL | Status: DC | PRN

## 2015-02-07 MED ORDER — FENTANYL CITRATE (PF) 100 MCG/2ML IJ SOLN
INTRAMUSCULAR | Status: DC | PRN
  Administered 2015-02-07 (×2): 50 ug via INTRAVENOUS

## 2015-02-07 MED ORDER — CLOPIDOGREL BISULFATE 75 MG PO TABS: 300.0000 mg | ORAL_TABLET | Freq: Once | ORAL | Status: DC

## 2015-02-07 MED ORDER — IOHEXOL 300 MG/ML  SOLN
INTRAMUSCULAR | Status: DC | PRN
  Administered 2015-02-07: 80 mL via INTRA_ARTERIAL

## 2015-02-07 MED ORDER — HEPARIN SODIUM (PORCINE) 1000 UNIT/ML IJ SOLN
INTRAMUSCULAR | Status: DC | PRN
  Administered 2015-02-07: 4000 [IU] via INTRAVENOUS

## 2015-02-07 MED ORDER — DEXTROSE 5 % IV SOLN
INTRAVENOUS | Status: AC
  Administered 2015-02-07: 10:00:00 via INTRAVENOUS
  Filled 2015-02-07: qty 1000

## 2015-02-07 MED ORDER — LIDOCAINE HCL (PF) 1 % IJ SOLN
INTRAMUSCULAR | Status: DC | PRN
  Administered 2015-02-07: 5 mL via INTRADERMAL

## 2015-02-07 MED ORDER — LIDOCAINE HCL (PF) 1 % IJ SOLN
INTRAMUSCULAR | Status: AC
  Filled 2015-02-07: qty 10

## 2015-02-07 MED ORDER — SODIUM CHLORIDE 0.9 % IV SOLN
INTRAVENOUS | Status: DC
  Administered 2015-02-07 (×2): via INTRAVENOUS

## 2015-02-07 MED ORDER — PANTOPRAZOLE SODIUM 40 MG IV SOLR: 40.0000 mg | Freq: Every day | INTRAVENOUS | Status: DC

## 2015-02-07 MED ORDER — ONDANSETRON HCL 4 MG/2ML IJ SOLN: 4.0000 mg | Freq: Four times a day (QID) | INTRAMUSCULAR | Status: DC | PRN

## 2015-02-07 MED ORDER — CLINDAMYCIN PHOSPHATE 300 MG/50ML IV SOLN
INTRAVENOUS | Status: AC
  Filled 2015-02-07: qty 50

## 2015-02-07 MED ORDER — HEPARIN (PORCINE) IN NACL 2-0.9 UNIT/ML-% IJ SOLN
INTRAMUSCULAR | Status: AC
  Filled 2015-02-07: qty 1000

## 2015-02-07 MED ORDER — MORPHINE SULFATE (PF) 4 MG/ML IV SOLN: 2.0000 mg | INTRAVENOUS | Status: DC | PRN

## 2015-02-07 MED ORDER — CLINDAMYCIN PHOSPHATE 300 MG/50ML IV SOLN
300.0000 mg | Freq: Once | INTRAVENOUS | Status: AC
  Administered 2015-02-07: 300 mg via INTRAVENOUS

## 2015-02-07 MED ORDER — ACETAMINOPHEN 325 MG PO TABS: 325.0000 mg | ORAL_TABLET | ORAL | Status: DC | PRN

## 2015-02-07 MED ORDER — ASPIRIN 81 MG PO CHEW
81.0000 mg | CHEWABLE_TABLET | Freq: Once | ORAL | Status: AC
  Administered 2015-02-07: 81 mg via ORAL

## 2015-02-07 MED ORDER — MIDAZOLAM HCL 2 MG/2ML IJ SOLN
INTRAMUSCULAR | Status: DC | PRN
  Administered 2015-02-07: 1 mg via INTRAVENOUS
  Administered 2015-02-07: 2 mg via INTRAVENOUS

## 2015-02-07 MED ORDER — CEFAZOLIN SODIUM 1-5 GM-% IV SOLN
INTRAVENOUS | Status: AC
  Filled 2015-02-07: qty 50

## 2015-02-07 MED ORDER — HEPARIN SODIUM (PORCINE) 1000 UNIT/ML IJ SOLN
INTRAMUSCULAR | Status: AC
Start: 2015-02-07 — End: 2015-02-07
  Filled 2015-02-07: qty 1

## 2015-02-07 MED ORDER — ASPIRIN 81 MG PO CHEW
CHEWABLE_TABLET | ORAL | Status: AC
  Filled 2015-02-07: qty 1

## 2015-02-07 MED ORDER — ACETAMINOPHEN 325 MG RE SUPP: 325.0000 mg | RECTAL | Status: DC | PRN

## 2015-02-07 MED ORDER — ALUM & MAG HYDROXIDE-SIMETH 200-200-20 MG/5ML PO SUSP: 15.0000 mL | ORAL | Status: DC | PRN

## 2015-02-07 MED ORDER — CLOPIDOGREL BISULFATE 75 MG PO TABS: 75.0000 mg | ORAL_TABLET | Freq: Every day | ORAL | Status: DC

## 2015-02-07 SURGICAL SUPPLY — 24 items
BALLN LUTONIX DCB 4X100X130 (BALLOONS) ×8
BALLN ULTRVRSE 3X300X150 (BALLOONS) ×1
BALLN ULTRVRSE 3X300X150 OTW (BALLOONS) ×3
BALLN ULTRVRSE 5X10XX130C (BALLOONS) ×4
BALLOON LUTONIX DCB 4X100X130 (BALLOONS) ×6 IMPLANT
BALLOON ULTRVRSE 3X300X150 OTW (BALLOONS) ×3 IMPLANT
BALLOON ULTRVRSE 5X10XX130C (BALLOONS) ×3 IMPLANT
CATH CROSSER 14S OTW 146CM (CATHETERS) ×4 IMPLANT
CATH PIG 70CM (CATHETERS) ×4 IMPLANT
CATH RIM 65CM (CATHETERS) ×4 IMPLANT
CATH SIDEKICK XL ST 110CM (SHEATH) ×4 IMPLANT
DEVICE PRESTO INFLATION (MISCELLANEOUS) ×4 IMPLANT
GLIDEWIRE ANGLED SS 035X260CM (WIRE) ×4 IMPLANT
KIT FLOWMATE PROCEDURAL (MISCELLANEOUS) ×4 IMPLANT
LIFESTENT 6X100X130 (Permanent Stent) ×4 IMPLANT
PACK ANGIOGRAPHY (CUSTOM PROCEDURE TRAY) ×4 IMPLANT
SET INTRO CAPELLA COAXIAL (SET/KITS/TRAYS/PACK) ×4 IMPLANT
SHEATH BRITE TIP 5FRX11 (SHEATH) ×4 IMPLANT
SHEATH BRITE TIP 6FRX11 (SHEATH) ×4 IMPLANT
SHEATH HIGHFLEX ANSEL 6FRX55 (SHEATH) ×4 IMPLANT
SYR MEDRAD MARK V 150ML (SYRINGE) ×4 IMPLANT
TUBING CONTRAST HIGH PRESS 72 (TUBING) ×4 IMPLANT
WIRE J 3MM .035X145CM (WIRE) ×4 IMPLANT
WIRE SPARTACORE .014X300CM (WIRE) ×4 IMPLANT

## 2015-02-07 NOTE — Discharge Instructions (Signed)

## 2015-02-07 NOTE — H&P (Signed)
[facility] History & Physical Update  The patient was interviewed and re-examined.  The patient's previous History and Physical has been reviewed and is unchanged.  There is no change in the plan of care. We plan to proceed with the scheduled procedure.  Schnier, Dolores Lory, MD  02/07/2015, 10:10 AM

## 2015-02-07 NOTE — Op Note (Signed)
[facility]  Percutaneous Study/Intervention Procedural Note   Date of Surgery: 02/07/2015  Surgeon:Shakisha Abend, Dolores Lory   Pre-operative Diagnosis: Atherosclerotic occlusive disease bilateral lower extremities with rest pain left leg worse than right Post-operative diagnosis:  Same  Procedure(s) Performed:  1.  Abdominal aortogram  2.  Left lower extremity angiography third order catheter placement  3.  Crosser atherectomy left superficial femoral artery  4.  Percutaneous transluminal and plasty and stent placement left superficial femoral artery  5.  Percutaneous transluminal angioplasty left popliteal artery   Anesthesia: Conscious sedation  Sheath: 6 French sheath right common femoral artery  Contrast: 80 cc  Fluoroscopy Time: 12.1 minutes  Indications:  Patient presented to the office with increasing pain in her lower extremities noninvasive studies as well as physical examination demonstrated severe atherosclerotic occlusive disease. The risks and benefits for angiography with the hope for intervention is reviewed all questions are answered alternative therapies were also reviewed. Patient has agreed to proceed with angiography and intervention if feasible.  Procedure:  [name] Denman Smithis a 81 y.o. female who was identified and appropriate procedural time out was performed.  The patient was then placed supine on the table and prepped and draped in the usual sterile fashion.  Ultrasound was used to evaluate the right common femoral artery.  It was patent .  A digital ultrasound image was acquired.  A micro- needle was used to access the right common femoral artery under direct ultrasound guidance and a permanent image was performed.  Micro-sheath was then advanced without difficulty area A 0.035 J wire was advanced without resistance and a 5Fr sheath was placed.    Pigtail catheter was then positioned to the level of T12 and AP projection of the aorta was obtained.  The catheter was repositioned to above the bifurcation and an RAO view of the pelvis was obtained. After review these images a stiff Glidewire and rim catheter was used to cross the aortic bifurcation LAO projection of the left common femoral was then obtained and subsequently the catheter was negotiated into the SFA and distal runoff was obtained.  4000 units of heparin was given stiff angle glide wires reintroduced and the 5 [place] sheath was exchanged for a [place]. Under fluoroscopy the [place] sheath was advanced up and over the bifurcation and positioned with its tip in the SFA. Sidekick catheter was then advanced over the wire down to the level of the occlusion at Hunter's canal. A 14 S Crosser atherectomy catheter was then prepped on the field and advanced through the Sidekick catheter. The occlusion was crossed without difficulty. Hand injection contrast through the Crosser catheter demonstrated intraluminal positioning in the distal popliteal. 014 [place] core wire was then introduced.  A 3 x 30 balloon was then advanced over the wire and used to angioplasty the popliteal artery from its distal one third proximally through the SFA occlusion. Follow-up imaging now demonstrated there is poor flow the SFA is still undersized as expected the area of stenosis within the popliteal appears to be well treated without evidence of dissection. A 4 x 100 Lutonix balloon is then advanced across Hunter's canal and inflated to 14 atm for approximately 2 minutes. A second 4 x 100 Lutonix balloon is used to complete this and plasty. Follow-up imaging demonstrates a flow limiting dissection with undersizing of the lumen at Hunter's canal and a 6 x 100 like stent is deployed across this area and postdilated with a 5 mm balloon to  10 atm. Follow-up imaging demonstrates an excellent result no evidence of hemodynamically significant residual stenosis and the dissection has been completely treated.  Distal runoff is  then obtained demonstrating 2 vessel runoff down to the foot via the anterior tibial and peroneal. A sterile runoff is preserved when compared to the preintervention imaging.  The sheath is then pulled back to the right external iliac oblique views obtained unfortunately there is severe disease located within the common femoral and I am unable to ascertain whether the actual puncture site is located above this disease and therefore ACT is performed which is 200 and the patient is taken to the recovery area where the sheath will be pulled and manual pressure will be held  Findings:   Aortogram:  The aorta demonstrates diffuse disease however there are no hemodynamically significant lesions within the aorta itself. Bilateral nephrograms are noted there is an accessory renal noted on the right which is widely patent the main renal artery demonstrates a 40% narrowing. Left renal arteries widely patent. Bilateral common and external iliac arteries are diffusely diseased but there are no hemodynamically significant lesions noted. Internal iliac arteries are patent bilaterally  Right Lower Extremity:  The right common femoral artery demonstrates diffuse very bulky calcific disease severe in nature and this essentially did preclude using a closure device. Profunda femoris is patent as is the proximal portion of the SFA  Left Lower Extremity:  The common femoral demonstrates several areas of disease but they do not appear to be flow limiting. The origins of the SFA and profunda are widely patent profunda femoris artery is diffusely diseased but patent throughout SFA occludes at Hunter's canal there is reconstitution of the at knee popliteal and then a secondary string sign within the popliteal located at the level of the tibial plateau. Trifurcation is notable for patency of the anterior tibial and tibioperoneal trunk which fills a peroneal posterior tibial is occluded throughout its entire course. Anterior tibial  posterior tibial are patent down to the foot other diffusely diseased. There do not appear to be any focal hemodynamically significant lesions.  Following Crosser atherectomy there is successful crossing of the occlusion following angioplasty there is a significant residual stenosis and a dissection both of which resolved with stenting as described above. Distal popliteal as well treated with and plasty alone.    Disposition: Patient was taken to the recovery room in stable condition having tolerated the procedure well.  Dama Hedgepeth, Dolores Lory 02/07/2015,10:11 AM

## 2015-02-08 ENCOUNTER — Encounter: Payer: Self-pay | Admitting: Vascular Surgery

## 2015-02-14 DIAGNOSIS — J449 Chronic obstructive pulmonary disease, unspecified: Secondary | ICD-10-CM | POA: Diagnosis not present

## 2015-02-16 ENCOUNTER — Telehealth: Payer: Self-pay

## 2015-02-16 ENCOUNTER — Ambulatory Visit
Admission: RE | Admit: 2015-02-16 | Discharge: 2015-02-16 | Disposition: A | Discharge status: Home / Self Care | Payer: Medicare Other | Source: Ambulatory Visit | Attending: Family Medicine | Admitting: Family Medicine

## 2015-02-16 ENCOUNTER — Ambulatory Visit (INDEPENDENT_AMBULATORY_CARE_PROVIDER_SITE_OTHER): Payer: Medicare Other | Admitting: Family Medicine

## 2015-02-16 ENCOUNTER — Encounter: Payer: Self-pay | Admitting: Family Medicine

## 2015-02-16 VITALS — BP 120/55 | HR 62 | Temp 98.5°F | Resp 16 | Ht 63.0 in | Wt 152.0 lb

## 2015-02-16 DIAGNOSIS — R5383 Other fatigue: Secondary | ICD-10-CM

## 2015-02-16 DIAGNOSIS — R0602 Shortness of breath: Secondary | ICD-10-CM | POA: Diagnosis not present

## 2015-02-16 DIAGNOSIS — I5021 Acute systolic (congestive) heart failure: Secondary | ICD-10-CM

## 2015-02-16 DIAGNOSIS — R079 Chest pain, unspecified: Secondary | ICD-10-CM | POA: Insufficient documentation

## 2015-02-16 DIAGNOSIS — I4819 Other persistent atrial fibrillation: Secondary | ICD-10-CM

## 2015-02-16 DIAGNOSIS — R600 Localized edema: Secondary | ICD-10-CM

## 2015-02-16 DIAGNOSIS — I481 Persistent atrial fibrillation: Secondary | ICD-10-CM | POA: Diagnosis not present

## 2015-02-16 DIAGNOSIS — N390 Urinary tract infection, site not specified: Secondary | ICD-10-CM | POA: Diagnosis not present

## 2015-02-16 DIAGNOSIS — I5022 Chronic systolic (congestive) heart failure: Secondary | ICD-10-CM | POA: Diagnosis not present

## 2015-02-16 DIAGNOSIS — Z23 Encounter for immunization: Secondary | ICD-10-CM | POA: Diagnosis not present

## 2015-02-16 LAB — POCT URINALYSIS DIPSTICK
Bilirubin, UA: NEGATIVE
GLUCOSE UA: NEGATIVE
Ketones, UA: NEGATIVE
Leukocytes, UA: NEGATIVE
Nitrite, UA: NEGATIVE
PROTEIN UA: NEGATIVE
SPEC GRAV UA: 1.01
UROBILINOGEN UA: NEGATIVE
pH, UA: 5

## 2015-02-16 MED ORDER — FUROSEMIDE 40 MG PO TABS: ORAL_TABLET | ORAL | Status: DC

## 2015-02-16 NOTE — Progress Notes (Signed)
Name: [name] [name]   MRN: [id number]    DOB: 24-May-1923   Date:02/16/2015       Progress Note  Subjective  Chief Complaint  Chief Complaint  Patient presents with  . Shortness of Breath    feeling weak pt had chest pain on L side shooting pain and had history and had steint put on Last Tuesday onset week and half.  . Urinary Tract Infection    Also feels burning while urinate    HPI C/o SOB and some chewst pain eve since she was in hosp. For femoral stent 9 days ago.  L. Foot and leg doing better, but SOB and some shooting pain in chest in L. L;at. Chest that is on and off. Walking may or may not bring on chest pain.  SOB with almost any activity.  Also c/o some burning with urination x 1 week.  She was not catherized in hosp. As far as she knows.  No problem-specific assessment & plan notes found for this encounter.   Past Medical History  Diagnosis Date  . HTN (hypertension)   . COPD (chronic obstructive pulmonary disease)     a. on home O2 nightly.  . Diverticulosis   . Takotsubo cardiomyopathy 10/2012    a. 10/2012: presented w/ cardiogenic shock/systolic CHF/VDRF - EF 35% by cath, 40% by f/u echo - cath with mild nonobstructive CAD. b. Not discharged on ACEI due to hypotension.  . Cardiogenic shock     a. 10/2012 - due to Takotsubo CM.  [name] Respiratory failure     a. On home O2 nightly. b. VDRF 10/2012 - due to CAP/pulm edema in setting of cardiogenic shock/Takotsubo CM.  [name] CAP (community acquired pneumonia)     a. 10/2012 - will need f/u CXR in June 2014 to ensure clearance.  . Esophageal stricture     a. s/p dilitation 10/2012.  [name] Pleural effusion, right     a. s/p thoracentesis 10/28/12.  . Atrial fibrillation     a. Dx 10/2012, rate controlled, started on Xarelto.  . Claudication     a. ABI 10/2012 - R 0.87, L 0.74.  [name] GERD (gastroesophageal reflux disease)   . CKD (chronic kidney disease), stage III   . Breast CA     hx  . Abdominal hernia     chronic  . CAD (coronary  artery disease)     a. 10/2012 - presented w/ STEMI felt due to Takotsubo - mild nonobstructive dz by cath.  [name] STEMI (ST elevation myocardial infarction)   . Cellulitis     Social History  Substance Use Topics  . Smoking status: Former Smoker -- 1.00 packs/day for 55 years    Types: Cigarettes  . Smokeless tobacco: Former Systems developer  . Alcohol Use: No     Current outpatient prescriptions:  .  ADVAIR DISKUS 250-50 MCG/DOSE AEPB, 2 puffs daily. , Disp: , Rfl:  .  albuterol (PROVENTIL) (2.5 MG/3ML) 0.083% nebulizer solution, INHALE THE CONTENTS OF 1 VIAL IN NEBULIZER 4 TIMES A DAY UNTIL FEELING BETTER THEN AS NEEDED, Disp: 75 mL, Rfl: 11 .  carvedilol (COREG) 6.25 MG tablet, TAKE 1 TABLET (6.25 MG TOTAL) BY MOUTH 2 (TWO) TIMES DAILY WITH A MEAL., Disp: 180 tablet, Rfl: 3 .  Cranberry-Vitamin C-Probiotic (AZO CRANBERRY PO), Take 2 tablets by mouth daily., Disp: , Rfl:  .  fluticasone (FLONASE) 50 MCG/ACT nasal spray, Place 2 sprays into the nose daily as needed. , Disp: , Rfl:  .  furosemide (LASIX) 40 MG tablet, Take 2 tablets in the AM and 1 tablet in the late afternoon., Disp: 90 tablet, Rfl: 6 .  gabapentin (NEURONTIN) 100 MG capsule, Take 1 capsule (100 mg total) by mouth 3 (three) times daily., Disp: 90 capsule, Rfl: 3 .  levalbuterol (XOPENEX HFA) 45 MCG/ACT inhaler, Inhale 2 puffs into the lungs every 6 (six) hours as needed for wheezing or shortness of breath., Disp: , Rfl:  .  loratadine (CLARITIN) 10 MG tablet, Take 10 mg by mouth daily., Disp: , Rfl:  .  Multiple Vitamin (MULTIVITAMIN) tablet, Take 1 tablet by mouth daily., Disp: , Rfl:  .  omeprazole (PRILOSEC) 20 MG capsule, Take 1 capsule (20 mg total) by mouth daily., Disp: 30 capsule, Rfl: 11 .  PROAIR HFA 108 (90 BASE) MCG/ACT inhaler, Inhale 1 puff into the lungs every 4 (four) hours as needed. , Disp: , Rfl:  .  ranitidine (ZANTAC) 150 MG capsule, TAKE 1 CAPSULE BY MOUTH EVERY NIGHT, Disp: 30 capsule, Rfl: 6 .  Rivaroxaban  (XARELTO) 15 MG TABS tablet, TAKE 1 TABLET Y MOUTH EVERY DAY, Disp: 30 tablet, Rfl: 6 .  SALINE NASAL SPRAY NA, Place into the nose 2 (two) times daily., Disp: , Rfl:  .  SPIRIVA HANDIHALER 18 MCG inhalation capsule, INHALE 1 INHILATION EVERYDAY, Disp: 30 capsule, Rfl: 12 .  vitamin B-12 (CYANOCOBALAMIN) 1000 MCG tablet, Take 1,000 mcg by mouth daily., Disp: , Rfl:   Allergies  Allergen Reactions  . Penicillins Itching, Swelling and Rash  . Sulfa Antibiotics     Review of Systems  Constitutional: Positive for malaise/fatigue. Negative for fever and chills.  HENT: Negative for hearing loss.   Eyes: Negative for blurred vision and double vision.  Respiratory: Positive for shortness of breath. Negative for sputum production and wheezing.   Cardiovascular: Positive for chest pain, palpitations and leg swelling. Negative for orthopnea.  Gastrointestinal: Negative for heartburn, nausea, vomiting, abdominal pain, diarrhea and blood in stool.  Genitourinary: Positive for dysuria and frequency. Negative for urgency.  Skin: Positive for rash.  Neurological: Negative for headaches.      Objective  Filed Vitals:   02/16/15 0937  BP: 122/57  Pulse: 62  Temp: 98.5 F (36.9 C)  TempSrc: Oral  Resp: 16  Height: 5' 3"  (1.6 m)  Weight: 152 lb (68.947 kg)  SpO2: 97%     Physical Exam  Constitutional: She is well-developed, well-nourished, and in no distress. No distress.  HENT:  Head: Normocephalic and atraumatic.  Eyes: Conjunctivae and EOM are normal. Pupils are equal, round, and reactive to light. No scleral icterus.  Neck: Normal range of motion. Neck supple. Carotid bruit is not present. No thyromegaly present.  Cardiovascular: Normal rate and intact distal pulses.  An irregularly irregular rhythm present. Exam reveals no gallop and no friction rub.   Murmur heard.  Systolic murmur is present with a grade of 2/6  throughout  Pulmonary/Chest: No respiratory distress. She has no  wheezes. She has no rales.  Abdominal: Soft. Bowel sounds are normal. She exhibits no distension and no mass. There is no tenderness.  Musculoskeletal: She exhibits edema (trace edema of L ankle/foot.).  Lymphadenopathy:    She has no cervical adenopathy.  Vitals reviewed.     Recent Results (from the past 2160 hour(s))  Basic Metabolic Panel (BMET)     Status: Abnormal   Collection Time: 12/23/14 10:42 AM  Result Value Ref Range   Glucose 111 (H) 65 - 99  mg/dL   BUN 17 10 - 36 mg/dL   Creatinine, Ser 1.08 (H) 0.57 - 1.00 mg/dL   GFR calc non Af Amer 45 (L) >59 mL/min/1.73   GFR calc Af Amer 52 (L) >59 mL/min/1.73   BUN/Creatinine Ratio 16 11 - 26   Sodium 140 134 - 144 mmol/L   Potassium 4.0 3.5 - 5.2 mmol/L   Chloride 93 (L) 97 - 108 mmol/L   CO2 30 (H) 18 - 29 mmol/L   Calcium 10.0 8.7 - 10.3 mg/dL  BUN     Status: Abnormal   Collection Time: 02/07/15  7:59 AM  Result Value Ref Range   BUN 21 (H) 6 - 20 mg/dL  Creatinine, serum     Status: Abnormal   Collection Time: 02/07/15  7:59 AM  Result Value Ref Range   Creatinine, Ser 1.25 (H) 0.44 - 1.00 mg/dL   GFR calc non Af Amer 37 (L) >60 mL/min   GFR calc Af Amer 43 (L) >60 mL/min    Comment: (NOTE) The eGFR has been calculated using the CKD EPI equation. This calculation has not been validated in all clinical situations. eGFR's persistently <60 mL/min signify possible Chronic Kidney Disease.   Protime-INR     Status: None   Collection Time: 02/07/15  7:59 AM  Result Value Ref Range   Prothrombin Time 13.1 11.4 - 15.0 seconds   INR 0.97   POCT Urinalysis Dipstick     Status: Abnormal   Collection Time: 02/16/15  9:59 AM  Result Value Ref Range   Color, UA yellow    Clarity, UA clear    Glucose, UA negative    Bilirubin, UA negative    Ketones, UA negative    Spec Grav, UA 1.010    Blood, UA trace    pH, UA 5.0    Protein, UA negative    Urobilinogen, UA negative    Nitrite, UA negative    Leukocytes, UA  Negative Negative     Assessment & Plan  1. UTI (lower urinary tract infection)  - POCT Urinalysis Dipstick - Urine Culture  2. SOB (shortness of breath)  - DG Chest 2 View; Future- no acute cardiopulmonary changes  3. Chest pain, unspecified chest pain type  - EKG 12-Lead-atrial fib with controlled response and old septal MI.  Unchanged. - DG Chest 2 View; Future  4. Acute systolic CHF (congestive heart failure)  - furosemide (LASIX) 40 MG tablet; Take 2 tablets twice a day for fluid  Dispense: 120 tablet; Refill: 6  5. Persistent atrial fibrillation   6. Need for influenza vaccination  - Flu vaccine HIGH DOSE PF (Fluzone High dose)  7. Pedal edema   8. Chronic systolic heart failure  - furosemide (LASIX) 40 MG tablet; Take 2 tablets twice a day for fluid  Dispense: 120 tablet; Refill: 6

## 2015-02-16 NOTE — Telephone Encounter (Signed)
Pt seen today at [facility] EKG sent for Dr. Tyrell Antonio review Per verbal from Dr. Fletcher Anon, EKG is no different and can f/u as scheduled.  S/w Sonya at [facility] and advised of Arida's recommendations.

## 2015-02-16 NOTE — Patient Instructions (Signed)
Spent >1 hr evaluating comlalints and talking with patient.

## 2015-02-17 ENCOUNTER — Telehealth: Payer: Self-pay | Admitting: *Deleted

## 2015-02-17 NOTE — Telephone Encounter (Signed)
Dr. Luan Pulling would like for Ms.Kauer to have some bloodwork done next week. Orders have been put in.

## 2015-02-17 NOTE — Addendum Note (Signed)
Addended by: Devona Konig on: 02/17/2015 02:44 PM   Modules accepted: Orders

## 2015-02-17 NOTE — Addendum Note (Signed)
Addended by: Frederich Cha D on: 02/17/2015 09:42 AM   Modules accepted: Miquel Dunn

## 2015-02-18 LAB — URINE CULTURE

## 2015-02-19 DIAGNOSIS — J449 Chronic obstructive pulmonary disease, unspecified: Secondary | ICD-10-CM | POA: Diagnosis not present

## 2015-02-20 ENCOUNTER — Telehealth: Payer: Self-pay | Admitting: *Deleted

## 2015-02-20 NOTE — Telephone Encounter (Signed)
Pt aware of urine cx results.

## 2015-02-23 ENCOUNTER — Other Ambulatory Visit: Payer: Self-pay | Admitting: *Deleted

## 2015-02-23 DIAGNOSIS — R5383 Other fatigue: Secondary | ICD-10-CM

## 2015-02-23 DIAGNOSIS — R0602 Shortness of breath: Secondary | ICD-10-CM

## 2015-03-01 ENCOUNTER — Other Ambulatory Visit: Payer: Self-pay | Admitting: Family Medicine

## 2015-03-01 MED ORDER — BENZONATATE 100 MG PO CAPS: 100.0000 mg | ORAL_CAPSULE | Freq: Two times a day (BID) | ORAL | Status: DC | PRN

## 2015-03-01 NOTE — Telephone Encounter (Signed)
If she needs an antibiotic, she needs to be seen.  Sounds that she just needs something for cough since she is really not sick.  Send in Benzonatate, 100 mg., 1 perle up to 3 times a day as needed for cough.  Send in #30/1 refill, to her pharmacy.  If she gets worse, would need to be seen in office or at urgent care.-jh

## 2015-03-01 NOTE — Telephone Encounter (Signed)
Pt  Called requesting a  z-pac to be called in   No fever  Dry cough  Pt  Call back # is  [phone]

## 2015-03-01 NOTE — Telephone Encounter (Signed)
Busy x 2 [facility]

## 2015-03-01 NOTE — Telephone Encounter (Signed)
Tried calling patient to let her know rx Benzonatate sent. Pt line busy x 3.[place]

## 2015-03-03 ENCOUNTER — Encounter: Payer: Self-pay | Admitting: Emergency Medicine

## 2015-03-03 ENCOUNTER — Emergency Department: Payer: Medicare Other

## 2015-03-03 ENCOUNTER — Observation Stay
Admission: EM | Admit: 2015-03-03 | Discharge: 2015-03-08 | Disposition: A | Discharge status: Home / Self Care | Payer: Medicare Other | Attending: Specialist | Admitting: Specialist

## 2015-03-03 DIAGNOSIS — Z853 Personal history of malignant neoplasm of breast: Secondary | ICD-10-CM | POA: Insufficient documentation

## 2015-03-03 DIAGNOSIS — J90 Pleural effusion, not elsewhere classified: Secondary | ICD-10-CM | POA: Insufficient documentation

## 2015-03-03 DIAGNOSIS — I5181 Takotsubo syndrome: Secondary | ICD-10-CM | POA: Insufficient documentation

## 2015-03-03 DIAGNOSIS — R1310 Dysphagia, unspecified: Secondary | ICD-10-CM | POA: Insufficient documentation

## 2015-03-03 DIAGNOSIS — J189 Pneumonia, unspecified organism: Secondary | ICD-10-CM | POA: Diagnosis not present

## 2015-03-03 DIAGNOSIS — I5022 Chronic systolic (congestive) heart failure: Secondary | ICD-10-CM | POA: Diagnosis not present

## 2015-03-03 DIAGNOSIS — Z7951 Long term (current) use of inhaled steroids: Secondary | ICD-10-CM | POA: Insufficient documentation

## 2015-03-03 DIAGNOSIS — I4891 Unspecified atrial fibrillation: Secondary | ICD-10-CM | POA: Diagnosis not present

## 2015-03-03 DIAGNOSIS — Z87891 Personal history of nicotine dependence: Secondary | ICD-10-CM | POA: Diagnosis not present

## 2015-03-03 DIAGNOSIS — I429 Cardiomyopathy, unspecified: Secondary | ICD-10-CM | POA: Diagnosis not present

## 2015-03-03 DIAGNOSIS — I252 Old myocardial infarction: Secondary | ICD-10-CM | POA: Insufficient documentation

## 2015-03-03 DIAGNOSIS — R0682 Tachypnea, not elsewhere classified: Secondary | ICD-10-CM | POA: Diagnosis not present

## 2015-03-03 DIAGNOSIS — J441 Chronic obstructive pulmonary disease with (acute) exacerbation: Secondary | ICD-10-CM | POA: Insufficient documentation

## 2015-03-03 DIAGNOSIS — Z9013 Acquired absence of bilateral breasts and nipples: Secondary | ICD-10-CM | POA: Diagnosis not present

## 2015-03-03 DIAGNOSIS — R748 Abnormal levels of other serum enzymes: Secondary | ICD-10-CM | POA: Insufficient documentation

## 2015-03-03 DIAGNOSIS — Z23 Encounter for immunization: Secondary | ICD-10-CM | POA: Diagnosis not present

## 2015-03-03 DIAGNOSIS — R918 Other nonspecific abnormal finding of lung field: Secondary | ICD-10-CM | POA: Diagnosis not present

## 2015-03-03 DIAGNOSIS — J449 Chronic obstructive pulmonary disease, unspecified: Secondary | ICD-10-CM | POA: Insufficient documentation

## 2015-03-03 DIAGNOSIS — Z79899 Other long term (current) drug therapy: Secondary | ICD-10-CM | POA: Insufficient documentation

## 2015-03-03 DIAGNOSIS — R740 Nonspecific elevation of levels of transaminase and lactic acid dehydrogenase [LDH]: Secondary | ICD-10-CM | POA: Diagnosis not present

## 2015-03-03 DIAGNOSIS — I130 Hypertensive heart and chronic kidney disease with heart failure and stage 1 through stage 4 chronic kidney disease, or unspecified chronic kidney disease: Secondary | ICD-10-CM | POA: Diagnosis not present

## 2015-03-03 DIAGNOSIS — Z882 Allergy status to sulfonamides status: Secondary | ICD-10-CM | POA: Diagnosis not present

## 2015-03-03 DIAGNOSIS — Z8249 Family history of ischemic heart disease and other diseases of the circulatory system: Secondary | ICD-10-CM | POA: Diagnosis not present

## 2015-03-03 DIAGNOSIS — I2510 Atherosclerotic heart disease of native coronary artery without angina pectoris: Secondary | ICD-10-CM | POA: Insufficient documentation

## 2015-03-03 DIAGNOSIS — I10 Essential (primary) hypertension: Secondary | ICD-10-CM | POA: Diagnosis not present

## 2015-03-03 DIAGNOSIS — M8580 Other specified disorders of bone density and structure, unspecified site: Secondary | ICD-10-CM | POA: Insufficient documentation

## 2015-03-03 DIAGNOSIS — Z7901 Long term (current) use of anticoagulants: Secondary | ICD-10-CM | POA: Diagnosis not present

## 2015-03-03 DIAGNOSIS — N183 Chronic kidney disease, stage 3 (moderate): Secondary | ICD-10-CM | POA: Insufficient documentation

## 2015-03-03 DIAGNOSIS — R7989 Other specified abnormal findings of blood chemistry: Secondary | ICD-10-CM

## 2015-03-03 DIAGNOSIS — Z880 Allergy status to penicillin: Secondary | ICD-10-CM | POA: Diagnosis not present

## 2015-03-03 DIAGNOSIS — I739 Peripheral vascular disease, unspecified: Secondary | ICD-10-CM | POA: Diagnosis not present

## 2015-03-03 DIAGNOSIS — K5730 Diverticulosis of large intestine without perforation or abscess without bleeding: Secondary | ICD-10-CM | POA: Insufficient documentation

## 2015-03-03 DIAGNOSIS — R0602 Shortness of breath: Secondary | ICD-10-CM

## 2015-03-03 DIAGNOSIS — E873 Alkalosis: Secondary | ICD-10-CM | POA: Diagnosis not present

## 2015-03-03 DIAGNOSIS — R778 Other specified abnormalities of plasma proteins: Secondary | ICD-10-CM

## 2015-03-03 DIAGNOSIS — R0902 Hypoxemia: Secondary | ICD-10-CM | POA: Diagnosis not present

## 2015-03-03 DIAGNOSIS — K219 Gastro-esophageal reflux disease without esophagitis: Secondary | ICD-10-CM | POA: Diagnosis not present

## 2015-03-03 DIAGNOSIS — J40 Bronchitis, not specified as acute or chronic: Secondary | ICD-10-CM | POA: Diagnosis not present

## 2015-03-03 DIAGNOSIS — Z9981 Dependence on supplemental oxygen: Secondary | ICD-10-CM | POA: Insufficient documentation

## 2015-03-03 DIAGNOSIS — J439 Emphysema, unspecified: Secondary | ICD-10-CM | POA: Diagnosis not present

## 2015-03-03 DIAGNOSIS — J9621 Acute and chronic respiratory failure with hypoxia: Secondary | ICD-10-CM | POA: Diagnosis not present

## 2015-03-03 DIAGNOSIS — R05 Cough: Secondary | ICD-10-CM | POA: Diagnosis not present

## 2015-03-03 DIAGNOSIS — J479 Bronchiectasis, uncomplicated: Secondary | ICD-10-CM | POA: Insufficient documentation

## 2015-03-03 HISTORY — DX: Heart failure, unspecified: I50.9

## 2015-03-03 LAB — COMPREHENSIVE METABOLIC PANEL
ALBUMIN: 3.6 g/dL (ref 3.5–5.0)
ALT: 13 U/L — ABNORMAL LOW (ref 14–54)
ANION GAP: 13 (ref 5–15)
AST: 22 U/L (ref 15–41)
Alkaline Phosphatase: 61 U/L (ref 38–126)
BILIRUBIN TOTAL: 1.1 mg/dL (ref 0.3–1.2)
BUN: 18 mg/dL (ref 6–20)
CHLORIDE: 87 mmol/L — AB (ref 101–111)
CO2: 33 mmol/L — AB (ref 22–32)
Calcium: 8.7 mg/dL — ABNORMAL LOW (ref 8.9–10.3)
Creatinine, Ser: 1.17 mg/dL — ABNORMAL HIGH (ref 0.44–1.00)
GFR calc Af Amer: 46 mL/min — ABNORMAL LOW (ref >60)
GFR calc non Af Amer: 40 mL/min — ABNORMAL LOW (ref >60)
GLUCOSE: 159 mg/dL — AB (ref 65–99)
POTASSIUM: 3.4 mmol/L — AB (ref 3.5–5.1)
SODIUM: 133 mmol/L — AB (ref 135–145)
Total Protein: 7.8 g/dL (ref 6.5–8.1)

## 2015-03-03 LAB — BLOOD GAS, ARTERIAL
Acid-Base Excess: 18 mmol/L — ABNORMAL HIGH (ref 0.0–3.0)
BICARBONATE: 44.2 meq/L — AB (ref 21.0–28.0)
FIO2: 0.28
O2 Saturation: 96.1 %
PATIENT TEMPERATURE: 37
PH ART: 7.49 — AB (ref 7.350–7.450)
PO2 ART: 76 mmHg — AB (ref 83.0–108.0)
pCO2 arterial: 58 mmHg — ABNORMAL HIGH (ref 32.0–48.0)

## 2015-03-03 LAB — CBC
HEMATOCRIT: 34.7 % — AB (ref 35.0–47.0)
HEMOGLOBIN: 11.7 g/dL — AB (ref 12.0–16.0)
MCH: 31.5 pg (ref 26.0–34.0)
MCHC: 33.6 g/dL (ref 32.0–36.0)
MCV: 93.8 fL (ref 80.0–100.0)
Platelets: 185 10*3/uL (ref 150–440)
RBC: 3.7 MIL/uL — ABNORMAL LOW (ref 3.80–5.20)
RDW: 14 % (ref 11.5–14.5)
WBC: 11.5 10*3/uL — ABNORMAL HIGH (ref 3.6–11.0)

## 2015-03-03 LAB — BRAIN NATRIURETIC PEPTIDE: B Natriuretic Peptide: 241 pg/mL — ABNORMAL HIGH (ref 0.0–100.0)

## 2015-03-03 LAB — TROPONIN I: TROPONIN I: 0.05 ng/mL — AB (ref <0.031)

## 2015-03-03 MED ORDER — IPRATROPIUM-ALBUTEROL 0.5-2.5 (3) MG/3ML IN SOLN
3.0000 mL | Freq: Once | RESPIRATORY_TRACT | Status: AC
  Administered 2015-03-03: 3 mL via RESPIRATORY_TRACT
  Filled 2015-03-03: qty 3

## 2015-03-03 MED ORDER — ONDANSETRON HCL 4 MG PO TABS: 4.0000 mg | ORAL_TABLET | Freq: Four times a day (QID) | ORAL | Status: DC | PRN

## 2015-03-03 MED ORDER — ONDANSETRON HCL 4 MG/2ML IJ SOLN: 4.0000 mg | Freq: Four times a day (QID) | INTRAMUSCULAR | Status: DC | PRN

## 2015-03-03 MED ORDER — ALBUTEROL SULFATE (2.5 MG/3ML) 0.083% IN NEBU: 2.5000 mg | INHALATION_SOLUTION | Freq: Four times a day (QID) | RESPIRATORY_TRACT | Status: DC | PRN

## 2015-03-03 MED ORDER — PANTOPRAZOLE SODIUM 40 MG PO TBEC
40.0000 mg | DELAYED_RELEASE_TABLET | Freq: Every day | ORAL | Status: DC
  Administered 2015-03-03 – 2015-03-08 (×6): 40 mg via ORAL
  Filled 2015-03-03 (×6): qty 1

## 2015-03-03 MED ORDER — METHYLPREDNISOLONE SODIUM SUCC 125 MG IJ SOLR
125.0000 mg | Freq: Once | INTRAMUSCULAR | Status: AC
  Administered 2015-03-03: 125 mg via INTRAVENOUS
  Filled 2015-03-03: qty 2

## 2015-03-03 MED ORDER — ACETAMINOPHEN 325 MG PO TABS
650.0000 mg | ORAL_TABLET | Freq: Four times a day (QID) | ORAL | Status: DC | PRN
  Administered 2015-03-05: 650 mg via ORAL

## 2015-03-03 MED ORDER — CARVEDILOL 6.25 MG PO TABS
6.2500 mg | ORAL_TABLET | Freq: Two times a day (BID) | ORAL | Status: DC
  Administered 2015-03-04 – 2015-03-08 (×7): 6.25 mg via ORAL
  Filled 2015-03-03 (×8): qty 1

## 2015-03-03 MED ORDER — LORATADINE 10 MG PO TABS
10.0000 mg | ORAL_TABLET | Freq: Every day | ORAL | Status: DC
  Administered 2015-03-04 – 2015-03-08 (×5): 10 mg via ORAL
  Filled 2015-03-03 (×5): qty 1

## 2015-03-03 MED ORDER — SENNOSIDES-DOCUSATE SODIUM 8.6-50 MG PO TABS: 1.0000 | ORAL_TABLET | Freq: Every evening | ORAL | Status: DC | PRN

## 2015-03-03 MED ORDER — BENZONATATE 100 MG PO CAPS
100.0000 mg | ORAL_CAPSULE | Freq: Two times a day (BID) | ORAL | Status: DC | PRN
  Filled 2015-03-03: qty 1

## 2015-03-03 MED ORDER — FAMOTIDINE 20 MG PO TABS
20.0000 mg | ORAL_TABLET | Freq: Every day | ORAL | Status: DC
  Administered 2015-03-03 – 2015-03-08 (×6): 20 mg via ORAL
  Filled 2015-03-03 (×6): qty 1

## 2015-03-03 MED ORDER — VITAMIN B-6 100 MG PO TABS
100.0000 mg | ORAL_TABLET | Freq: Every day | ORAL | Status: DC
  Administered 2015-03-04 – 2015-03-08 (×5): 100 mg via ORAL
  Filled 2015-03-03 (×5): qty 1

## 2015-03-03 MED ORDER — TIOTROPIUM BROMIDE MONOHYDRATE 18 MCG IN CAPS
18.0000 ug | ORAL_CAPSULE | Freq: Every day | RESPIRATORY_TRACT | Status: DC
  Administered 2015-03-04 – 2015-03-08 (×5): 18 ug via RESPIRATORY_TRACT
  Filled 2015-03-03 (×2): qty 5

## 2015-03-03 MED ORDER — ASPIRIN 325 MG PO TABS
325.0000 mg | ORAL_TABLET | Freq: Once | ORAL | Status: AC
  Administered 2015-03-03: 325 mg via ORAL
  Filled 2015-03-03: qty 1

## 2015-03-03 MED ORDER — LEVOFLOXACIN 250 MG PO TABS
250.0000 mg | ORAL_TABLET | Freq: Every day | ORAL | Status: DC
  Administered 2015-03-04 – 2015-03-08 (×5): 250 mg via ORAL
  Filled 2015-03-03 (×5): qty 1

## 2015-03-03 MED ORDER — MOMETASONE FURO-FORMOTEROL FUM 100-5 MCG/ACT IN AERO
2.0000 | INHALATION_SPRAY | Freq: Two times a day (BID) | RESPIRATORY_TRACT | Status: DC
  Administered 2015-03-03 – 2015-03-08 (×10): 2 via RESPIRATORY_TRACT
  Filled 2015-03-03: qty 8.8

## 2015-03-03 MED ORDER — VITAMIN B-12 1000 MCG PO TABS
1000.0000 ug | ORAL_TABLET | Freq: Every day | ORAL | Status: DC
  Administered 2015-03-04 – 2015-03-08 (×5): 1000 ug via ORAL
  Filled 2015-03-03 (×5): qty 1

## 2015-03-03 MED ORDER — HYDROCODONE-ACETAMINOPHEN 5-325 MG PO TABS: 1.0000 | ORAL_TABLET | ORAL | Status: DC | PRN

## 2015-03-03 MED ORDER — IPRATROPIUM BROMIDE 0.02 % IN SOLN
0.5000 mg | Freq: Once | RESPIRATORY_TRACT | Status: DC
  Filled 2015-03-03: qty 2.5

## 2015-03-03 MED ORDER — PNEUMOCOCCAL VAC POLYVALENT 25 MCG/0.5ML IJ INJ
0.5000 mL | INJECTION | INTRAMUSCULAR | Status: AC
  Administered 2015-03-04: 0.5 mL via INTRAMUSCULAR
  Filled 2015-03-03: qty 0.5

## 2015-03-03 MED ORDER — FUROSEMIDE 80 MG PO TABS
80.0000 mg | ORAL_TABLET | Freq: Two times a day (BID) | ORAL | Status: DC
  Administered 2015-03-04 – 2015-03-08 (×9): 80 mg via ORAL
  Filled 2015-03-03 (×9): qty 1

## 2015-03-03 MED ORDER — RIVAROXABAN 15 MG PO TABS
15.0000 mg | ORAL_TABLET | Freq: Every day | ORAL | Status: DC
  Administered 2015-03-03 – 2015-03-07 (×5): 15 mg via ORAL
  Filled 2015-03-03 (×6): qty 1

## 2015-03-03 MED ORDER — ACETAMINOPHEN 650 MG RE SUPP
650.0000 mg | Freq: Four times a day (QID) | RECTAL | Status: DC | PRN
Start: 2015-03-03 — End: 2015-03-08

## 2015-03-03 MED ORDER — ALBUTEROL (5 MG/ML) CONTINUOUS INHALATION SOLN
10.0000 mg/h | INHALATION_SOLUTION | Freq: Once | RESPIRATORY_TRACT | Status: DC
  Filled 2015-03-03: qty 20

## 2015-03-03 MED ORDER — ENOXAPARIN SODIUM 40 MG/0.4ML ~~LOC~~ SOLN: 40.0000 mg | SUBCUTANEOUS | Status: DC

## 2015-03-03 MED ORDER — METHYLPREDNISOLONE SODIUM SUCC 40 MG IJ SOLR
40.0000 mg | Freq: Three times a day (TID) | INTRAMUSCULAR | Status: DC
  Administered 2015-03-03 – 2015-03-06 (×8): 40 mg via INTRAVENOUS
  Filled 2015-03-03 (×7): qty 1

## 2015-03-03 MED ORDER — LEVOFLOXACIN 500 MG PO TABS
500.0000 mg | ORAL_TABLET | Freq: Once | ORAL | Status: AC
Start: 2015-03-03 — End: 2015-03-03
  Administered 2015-03-03: 500 mg via ORAL
  Filled 2015-03-03: qty 1

## 2015-03-03 MED ORDER — ALBUTEROL SULFATE HFA 108 (90 BASE) MCG/ACT IN AERS: 2.0000 | INHALATION_SPRAY | RESPIRATORY_TRACT | Status: DC | PRN

## 2015-03-03 NOTE — Progress Notes (Signed)
ANTIBIOTIC CONSULT NOTE - INITIAL  Pharmacy Consult for Levaquin Indication: pneumonia  Allergies  Allergen Reactions  . Penicillins Itching, Swelling, Rash and Other (See Comments)    Pt is unable to answer additional questions about this medication because it happened so long ago.  . Sulfa Antibiotics Other (See Comments)    Reaction:  Unknown     Patient Measurements: Height: 5\' 6"  (167.6 cm) Weight: 148 lb (67.132 kg) IBW/kg (Calculated) : 59.3  Vital Signs: Temp: 98.6 F (37 C) (09/23 1441) BP: 139/61 mmHg (09/23 1730) Pulse Rate: 65 (09/23 1745)  Labs:  Recent Labs  03/03/15 1501  WBC 11.5*  HGB 11.7*  PLT 185  CREATININE 1.17*   Estimated Creatinine Clearance: 29.9 mL/min (by C-G formula based on Cr of 1.17).  Microbiology: Recent Results (from the past 720 hour(s))  Urine Culture     Status: None   Collection Time: 02/16/15 12:00 AM  Result Value Ref Range Status   Urine Culture, Routine Final report  Final   Urine Culture result 1 Comment  Final    Comment: Mixed urogenital flora 10,000-25,000 colony forming units per mL     Medical History: Past Medical History  Diagnosis Date  . HTN (hypertension)   . COPD (chronic obstructive pulmonary disease)     a. on home O2 nightly.  . Diverticulosis   . Takotsubo cardiomyopathy 10/2012    a. 10/2012: presented w/ cardiogenic shock/systolic CHF/VDRF - EF 40% by cath, 40% by f/u echo - cath with mild nonobstructive CAD. b. Not discharged on ACEI due to hypotension.  . Cardiogenic shock     a. 10/2012 - due to Takotsubo CM.  [name] Respiratory failure     a. On home O2 nightly. b. VDRF 10/2012 - due to CAP/pulm edema in setting of cardiogenic shock/Takotsubo CM.  [name] CAP (community acquired pneumonia)     a. 10/2012 - will need f/u CXR in June 2014 to ensure clearance.  . Esophageal stricture     a. s/p dilitation 10/2012.  [name] Pleural effusion, right     a. s/p thoracentesis 10/28/12.  . Atrial fibrillation     a.  Dx 10/2012, rate controlled, started on Xarelto.  . Claudication     a. ABI 10/2012 - R 0.87, L 0.74.  [name] GERD (gastroesophageal reflux disease)   . CKD (chronic kidney disease), stage III   . Breast CA     hx  . Abdominal hernia     chronic  . CAD (coronary artery disease)     a. 10/2012 - presented w/ STEMI felt due to Takotsubo - mild nonobstructive dz by cath.  [name] STEMI (ST elevation myocardial infarction)   . Cellulitis     Medications:  Scheduled:  . aspirin  325 mg Oral Once  . levofloxacin  500 mg Oral Once   Assessment: [name] [name] is a 81 yo female being treated for CAP (CXR negative but pt is symptomatic).   Plan:  Follow up culture results Pt was initiated on levaquin 500mg  PO x1 in ED. Due to CrCl 29.9 ml/min, initiate levaquin 250mg  PO q24hrs.    Pharmacy will continue to monitor for changes in renal function and adjust dose accordingly.  Vena Rua 03/03/2015,7:10 PM

## 2015-03-03 NOTE — ED Provider Notes (Signed)
CSN: [id number]     Arrival date & time 03/03/15  1405 History   First MD Initiated Contact with Patient 03/03/15 1624     Chief Complaint  Patient presents with  . Shortness of Breath  . Cough     (Consider location/radiation/quality/duration/timing/severity/associated sxs/prior Treatment) The history is provided by the patient.  [name] [name] is a 81 y.o. female hx of HTN, COPD, Takosubo cardiomyopathy in 2014, afib on xarelto here with shortness of breath, cough. Symptoms going on for about 2 weeks. Patient has been having productive cough with sputum for the last week or so. Patient has subjective fevers and just feels diffusely weak. She is on 2 L nasal cannula at baseline but that has not been helping. She has chronic left swelling that is not getting worse. Denies any chest pain. She saw her doctor week ago and had normal chest x-ray. Of note, patient was admitted 2 years ago for takasubo cardiomyopathy and had an elevated troponin and normal cath and was intubated.   Past Medical History  Diagnosis Date  . HTN (hypertension)   . COPD (chronic obstructive pulmonary disease)     a. on home O2 nightly.  . Diverticulosis   . Takotsubo cardiomyopathy 10/2012    a. 10/2012: presented w/ cardiogenic shock/systolic CHF/VDRF - EF 57% by cath, 40% by f/u echo - cath with mild nonobstructive CAD. b. Not discharged on ACEI due to hypotension.  . Cardiogenic shock     a. 10/2012 - due to Takotsubo CM.  [name] Respiratory failure     a. On home O2 nightly. b. VDRF 10/2012 - due to CAP/pulm edema in setting of cardiogenic shock/Takotsubo CM.  [name] CAP (community acquired pneumonia)     a. 10/2012 - will need f/u CXR in June 2014 to ensure clearance.  . Esophageal stricture     a. s/p dilitation 10/2012.  [name] Pleural effusion, right     a. s/p thoracentesis 10/28/12.  . Atrial fibrillation     a. Dx 10/2012, rate controlled, started on Xarelto.  . Claudication     a. ABI 10/2012 - R 0.87, L 0.74.  [name] GERD  (gastroesophageal reflux disease)   . CKD (chronic kidney disease), stage III   . Breast CA     hx  . Abdominal hernia     chronic  . CAD (coronary artery disease)     a. 10/2012 - presented w/ STEMI felt due to Takotsubo - mild nonobstructive dz by cath.  [name] STEMI (ST elevation myocardial infarction)   . Cellulitis    Past Surgical History  Procedure Laterality Date  . Mastectomy    . Cataract extraction    . Esophagogastroduodenoscopy N/A 11/05/2012    Procedure: ESOPHAGOGASTRODUODENOSCOPY (EGD) with esophageal savary dilatation;  Surgeon: Missy Sabins, MD;  Location: [place];  Service: Endoscopy;  Laterality: N/A;  savary dil...no floro needed  . Bladder surgery    . Left heart catheterization with coronary angiogram N/A 10/24/2012    Procedure: LEFT HEART CATHETERIZATION WITH CORONARY ANGIOGRAM;  Surgeon: Sherren Mocha, MD;  Location: [facility] CATH LAB;  Service: Cardiovascular;  Laterality: N/A;  . Coronary angioplasty    . Peripheral vascular catheterization N/A 02/07/2015    Procedure: Abdominal Aortogram w/Lower Extremity;  Surgeon: Katha Cabal, MD;  Location: [place] CV LAB;  Service: Cardiovascular;  Laterality: N/A;  . Peripheral vascular catheterization  02/07/2015    Procedure: Lower Extremity Intervention;  Surgeon: Katha Cabal, MD;  Location: [facility]  INVASIVE CV LAB;  Service: Cardiovascular;;   Family History  Problem Relation Age of Onset  . Heart attack Mother    Social History  Substance Use Topics  . Smoking status: Former Smoker -- 1.00 packs/day for 55 years    Types: Cigarettes  . Smokeless tobacco: Former Systems developer  . Alcohol Use: No   OB History    No data available     Review of Systems  Respiratory: Positive for cough and shortness of breath.   All other systems reviewed and are negative.     Allergies  Penicillins and Sulfa antibiotics  Home Medications   Prior to Admission medications   Medication Sig Start Date End Date Taking?  Authorizing Provider  ADVAIR DISKUS 250-50 MCG/DOSE AEPB 2 puffs daily.     Historical Provider, MD  albuterol (PROVENTIL) (2.5 MG/3ML) 0.083% nebulizer solution INHALE THE CONTENTS OF 1 VIAL IN NEBULIZER 4 TIMES A DAY UNTIL FEELING BETTER THEN AS NEEDED 11/22/14   Arlis Porta., MD  benzonatate (TESSALON) 100 MG capsule Take 1 capsule (100 mg total) by mouth 2 (two) times daily as needed for cough. 03/01/15   Arlis Porta., MD  carvedilol (COREG) 6.25 MG tablet TAKE 1 TABLET (6.25 MG TOTAL) BY MOUTH 2 (TWO) TIMES DAILY WITH A MEAL. 09/09/14   Wellington Hampshire, MD  Cranberry-Vitamin C-Probiotic (AZO CRANBERRY PO) Take 2 tablets by mouth daily.    Historical Provider, MD  fluticasone (FLONASE) 50 MCG/ACT nasal spray Place 2 sprays into the nose daily as needed.     Historical Provider, MD  furosemide (LASIX) 40 MG tablet Take 2 tablets twice a day for fluid 02/16/15   Arlis Porta., MD  gabapentin (NEURONTIN) 100 MG capsule Take 1 capsule (100 mg total) by mouth 3 (three) times daily. 12/28/14   Arlis Porta., MD  levalbuterol [facility] HFA) 45 MCG/ACT inhaler Inhale 2 puffs into the lungs every 6 (six) hours as needed for wheezing or shortness of breath. 11/06/12   Dayna N Dunn, PA-C  loratadine (CLARITIN) 10 MG tablet Take 10 mg by mouth daily.    Historical Provider, MD  Multiple Vitamin (MULTIVITAMIN) tablet Take 1 tablet by mouth daily.    Historical Provider, MD  omeprazole (PRILOSEC) 20 MG capsule Take 1 capsule (20 mg total) by mouth daily. 12/21/14   Arlis Porta., MD  PROAIR HFA 108 (90 BASE) MCG/ACT inhaler Inhale 1 puff into the lungs every 4 (four) hours as needed.  05/13/13   Historical Provider, MD  ranitidine (ZANTAC) 150 MG capsule TAKE 1 CAPSULE BY MOUTH EVERY NIGHT 12/15/14   Arlis Porta., MD  Rivaroxaban (XARELTO) 15 MG TABS tablet TAKE 1 TABLET Y MOUTH EVERY DAY 01/19/15   Wellington Hampshire, MD  SALINE NASAL SPRAY NA Place into the nose 2 (two) times daily.     Historical Provider, MD  [facility] HANDIHALER 18 MCG inhalation capsule INHALE 1 INHILATION EVERYDAY 11/29/14   Arlis Porta., MD  vitamin B-12 (CYANOCOBALAMIN) 1000 MCG tablet Take 1,000 mcg by mouth daily.    Historical Provider, MD   BP 139/61 mmHg  Pulse 65  Temp(Src) 98.6 F (37 C)  Resp 24  Ht 5\' 6"  (1.676 m)  Wt 148 lb (67.132 kg)  BMI 23.90 kg/m2  SpO2 97% Physical Exam  Constitutional: She is oriented to person, place, and time.  Uncomfortable, tachypneic   HENT:  Head: Normocephalic.  Mouth/Throat: Oropharynx is clear  and moist.  Eyes: Conjunctivae are normal. Pupils are equal, round, and reactive to light.  Neck: Normal range of motion. Neck supple.  Cardiovascular: Normal rate, regular rhythm and normal heart sounds.   Pulmonary/Chest:  Tachypneic, diminished throughout, retractions.   Abdominal: Soft. Bowel sounds are normal. She exhibits no distension. There is no tenderness. There is no rebound.  Musculoskeletal: Normal range of motion.  1+ edema bilaterally   Neurological: She is alert and oriented to person, place, and time. No cranial nerve deficit. Coordination normal.  Skin: Skin is warm and dry.  Psychiatric: She has a normal mood and affect. Her behavior is normal. Judgment and thought content normal.  Nursing note and vitals reviewed.   ED Course  Procedures (including critical care time) Labs Review Labs Reviewed  TROPONIN I - Abnormal; Notable for the following:    Troponin I 0.05 (*)    All other components within normal limits  COMPREHENSIVE METABOLIC PANEL - Abnormal; Notable for the following:    Sodium 133 (*)    Potassium 3.4 (*)    Chloride 87 (*)    CO2 33 (*)    Glucose, Bld 159 (*)    Creatinine, Ser 1.17 (*)    Calcium 8.7 (*)    ALT 13 (*)    GFR calc non Af Amer 40 (*)    GFR calc Af Amer 46 (*)    All other components within normal limits  CBC - Abnormal; Notable for the following:    WBC 11.5 (*)    RBC 3.70 (*)     Hemoglobin 11.7 (*)    HCT 34.7 (*)    All other components within normal limits  BRAIN NATRIURETIC PEPTIDE - Abnormal; Notable for the following:    B Natriuretic Peptide 241.0 (*)    All other components within normal limits  BLOOD GAS, ARTERIAL - Abnormal; Notable for the following:    pH, Arterial 7.49 (*)    pCO2 arterial 58 (*)    pO2, Arterial 76 (*)    Bicarbonate 44.2 (*)    Acid-Base Excess 18.0 (*)    Allens test (pass/fail) YES (*)    All other components within normal limits    Imaging Review Dg Chest 2 View  03/03/2015   CLINICAL DATA:  Pneumonia and shortness of breath. Former smoker. History of breast cancer.  EXAM: CHEST  2 VIEW  COMPARISON:  PA and lateral chest 02/16/2015 and 05/20/2014. CT chest and PA and lateral chest 03/15/2014.  FINDINGS: The lungs are emphysematous. The patient is status post bilateral mastectomy. Surgical clips left axilla are noted. The lungs are clear. Heart size is normal. No pneumothorax or pleural effusion. Remote lower thoracic spine compression fractures are unchanged.  IMPRESSION: Emphysema without acute disease.   Electronically Signed   By: Inge Rise M.D.   On: 03/03/2015 15:24   I have personally reviewed and evaluated these images and lab results as part of my medical decision-making.   EKG Interpretation None       ED ECG REPORT I, YAO, DAVID, the attending physician, personally viewed and interpreted this ECG.   Date: 03/03/2015  EKG Time: 14:49  Rate: 83  Rhythm: atrial fibrillation, rate 83  Axis: normal  Intervals:none  ST&T Change: nonspecific  ED ECG REPORT I, YAO, DAVID, the attending physician, personally viewed and interpreted this ECG.   Date: 03/03/2015  EKG Time: 16:44  Rate: 73  Rhythm: atrial fibrillation, rate 73  Axis: normal  Intervals:none  ST&T Change: nonspecific      MDM   Final diagnoses:  None   [name] [name] is a 81 y.o. female here with shortness of breath. Likely COPD vs  CHF vs pneumonia vs ACS. Will get labs, Trop, CXR, BNP.   6:57 PM Labs showed metabolic alkalosis likely from chronic respiratory acidosis. ABG reassuring. CXR showed no pneumonia. Given 2 duonebs, solumedrol. Tried to have patient ambulate but was only able to walk 4-5 steps and became tachypneic to 30s. Lives by herself, full code. Will admit for COPD. Troponin positive, likely demand ischemia from hypoxia. Given ASA.     Wandra Arthurs, MD 03/03/15 [id number]

## 2015-03-03 NOTE — H&P (Signed)
[place] [facility] [place] NAME: [name] [name]    MR#:  [id number]  DATE OF BIRTH:  Dec 17, 1922  DATE OF ADMISSION:  03/03/2015  PRIMARY CARE PHYSICIAN: [name], MD   REQUESTING/REFERRING PHYSICIAN: Dr Darl Householder  CHIEF COMPLAINT:  Shortness of breath and chest tightness and productive cough for several days  HISTORY OF PRESENT ILLNESS:  [name] [name]  is a 81 y.o. female with a known history of chronic respiratory failure secondary to COPD on 2-1/2 L of oxygen at home, hypertension, cardiomyopathy , history of esophageal stricture status post dilatation comes to the emergency room with increasing shortness of breath chest tightness on and off going for last several days. Patient received some Tessalon Perles from her PCP. She started having productive greenish phlegm. Started having shortness of breath to the point became hypoxic with sats down in the 80s despite being on oxygen. Came to the emergency room received nebulizer continued on oxygen and received some IV Solu-Medrol. Patient is being admitted for acute on chronic hypoxic respiratory failure with underlying mild acute bronchitis.  PAST MEDICAL HISTORY:   Past Medical History  Diagnosis Date  . HTN (hypertension)   . COPD (chronic obstructive pulmonary disease)     a. on home O2 nightly.  . Diverticulosis   . Takotsubo cardiomyopathy 10/2012    a. 10/2012: presented w/ cardiogenic shock/systolic CHF/VDRF - EF 16% by cath, 40% by f/u echo - cath with mild nonobstructive CAD. b. Not discharged on ACEI due to hypotension.  . Cardiogenic shock     a. 10/2012 - due to Takotsubo CM.  [name] Respiratory failure     a. On home O2 nightly. b. VDRF 10/2012 - due to CAP/pulm edema in setting of cardiogenic shock/Takotsubo CM.  [name] CAP (community acquired pneumonia)     a. 10/2012 - will need f/u CXR in June 2014 to ensure clearance.  . Esophageal stricture     a. s/p dilitation 10/2012.  [name] Pleural effusion,  right     a. s/p thoracentesis 10/28/12.  . Atrial fibrillation     a. Dx 10/2012, rate controlled, started on Xarelto.  . Claudication     a. ABI 10/2012 - R 0.87, L 0.74.  [name] GERD (gastroesophageal reflux disease)   . CKD (chronic kidney disease), stage III   . Breast CA     hx  . Abdominal hernia     chronic  . CAD (coronary artery disease)     a. 10/2012 - presented w/ STEMI felt due to Takotsubo - mild nonobstructive dz by cath.  [name] STEMI (ST elevation myocardial infarction)   . Cellulitis     PAST SURGICAL HISTOIRY:   Past Surgical History  Procedure Laterality Date  . Mastectomy    . Cataract extraction    . Esophagogastroduodenoscopy N/A 11/05/2012    Procedure: ESOPHAGOGASTRODUODENOSCOPY (EGD) with esophageal savary dilatation;  Surgeon: Missy Sabins, MD;  Location: [facility];  Service: Endoscopy;  Laterality: N/A;  savary dil...no floro needed  . Bladder surgery    . Left heart catheterization with coronary angiogram N/A 10/24/2012    Procedure: LEFT HEART CATHETERIZATION WITH CORONARY ANGIOGRAM;  Surgeon: Sherren Mocha, MD;  Location: [facility];  Service: Cardiovascular;  Laterality: N/A;  . Coronary angioplasty    . Peripheral vascular catheterization N/A 02/07/2015    Procedure: Abdominal Aortogram w/Lower Extremity;  Surgeon: Katha Cabal, MD;  Location: [facility];  Service: Cardiovascular;  Laterality: N/A;  . Peripheral vascular catheterization  02/07/2015    Procedure: Lower Extremity Intervention;  Surgeon: Katha Cabal, MD;  Location: [facility];  Service: Cardiovascular;;    SOCIAL HISTORY:   Social History  Substance Use Topics  . Smoking status: Former Smoker -- 1.00 packs/day for 55 years    Types: Cigarettes  . Smokeless tobacco: Former Systems developer  . Alcohol Use: No    FAMILY HISTORY:   Family History  Problem Relation Age of Onset  . Heart attack Mother     DRUG ALLERGIES:   Allergies  Allergen Reactions  .  Penicillins Itching, Swelling, Rash and Other (See Comments)    Pt is unable to answer additional questions about this medication because it happened so long ago.  . Sulfa Antibiotics Other (See Comments)    Reaction:  Unknown     REVIEW OF SYSTEMS:  Review of Systems  Constitutional: Negative for fever, chills and weight loss.  HENT: Negative for ear discharge, ear pain and nosebleeds.   Eyes: Negative for blurred vision, pain and discharge.  Respiratory: Positive for cough, sputum production and shortness of breath. Negative for wheezing and stridor.   Cardiovascular: Negative for chest pain, palpitations, orthopnea and PND.  Gastrointestinal: Negative for nausea, vomiting, abdominal pain and diarrhea.  Genitourinary: Negative for urgency and frequency.  Musculoskeletal: Negative for back pain and joint pain.  Neurological: Positive for weakness. Negative for sensory change, speech change and focal weakness.  Psychiatric/Behavioral: Negative for depression. The patient is not nervous/anxious.   All other systems reviewed and are negative.    MEDICATIONS AT HOME:   Prior to Admission medications   Medication Sig Start Date End Date Taking? Authorizing Provider  albuterol (PROVENTIL HFA;VENTOLIN HFA) 108 (90 BASE) MCG/ACT inhaler Inhale 2 puffs into the lungs every 4 (four) hours as needed for wheezing or shortness of breath.   Yes Historical Provider, MD  albuterol (PROVENTIL) (2.5 MG/3ML) 0.083% nebulizer solution Take 2.5 mg by nebulization 4 (four) times daily as needed for wheezing or shortness of breath.   Yes Historical Provider, MD  benzonatate (TESSALON) 100 MG capsule Take 1 capsule (100 mg total) by mouth 2 (two) times daily as needed for cough. 03/01/15  Yes Arlis Porta., MD  carvedilol (COREG) 6.25 MG tablet Take 6.25 mg by mouth 2 (two) times daily with a meal.   Yes Historical Provider, MD  Fluticasone-Salmeterol (ADVAIR) 250-50 MCG/DOSE AEPB Inhale 1 puff into the  lungs 2 (two) times daily.   Yes Historical Provider, MD  furosemide (LASIX) 40 MG tablet Take 80 mg by mouth 2 (two) times daily.   Yes Historical Provider, MD  loratadine (CLARITIN) 10 MG tablet Take 10 mg by mouth daily.   Yes Historical Provider, MD  omeprazole (PRILOSEC) 20 MG capsule Take 1 capsule (20 mg total) by mouth daily. 12/21/14  Yes Arlis Porta., MD  pyridOXINE (VITAMIN B-6) 100 MG tablet Take 100 mg by mouth daily.   Yes Historical Provider, MD  ranitidine (ZANTAC) 150 MG tablet Take 150 mg by mouth at bedtime.   Yes Historical Provider, MD  Rivaroxaban (XARELTO) 15 MG TABS tablet Take 15 mg by mouth at bedtime.   Yes Historical Provider, MD  tiotropium (SPIRIVA) 18 MCG inhalation capsule Place 18 mcg into inhaler and inhale daily.   Yes Historical Provider, MD  vitamin B-12 (CYANOCOBALAMIN) 1000 MCG tablet Take 1,000 mcg by mouth daily.   Yes Historical Provider, MD  gabapentin (  NEURONTIN) 100 MG capsule Take 1 capsule (100 mg total) by mouth 3 (three) times daily. Patient not taking: Reported on 03/03/2015 12/28/14   Arlis Porta., MD      VITAL SIGNS:  Blood pressure 139/61, pulse 65, temperature 98.6 F (37 C), resp. rate 24, height 5\' 6"  (1.676 m), weight 67.132 kg (148 lb), SpO2 97 %.  PHYSICAL EXAMINATION:  GENERAL:  81 y.o.-year-old patient lying in the bed with no acute distress.  EYES: Pupils equal, round, reactive to light and accommodation. No scleral icterus. Extraocular muscles intact.  HEENT: Head atraumatic, normocephalic. Oropharynx and nasopharynx clear.  NECK:  Supple, no jugular venous distention. No thyroid enlargement, no tenderness.  LUNGS: Distant breath sounds bilaterally, no wheezing, rales,rhonchi or crepitation. No use of accessory muscles of respiration.  CARDIOVASCULAR: S1, S2 normal. No murmurs, rubs, or gallops.  ABDOMEN: Soft, nontender, nondistended. Bowel sounds present. No organomegaly or mass.  EXTREMITIES: Chronic venous stasis  changes no pedal edema  NEUROLOGIC: Cranial nerves II through XII are intact. Muscle strength 5/5 in all extremities. Sensation intact. Gait not checked.  PSYCHIATRIC: The patient is alert and oriented x 3.  SKIN: No obvious rash, lesion, or ulcer.   LABORATORY PANEL:   CBC  Recent Labs Lab 03/03/15 1501  WBC 11.5*  HGB 11.7*  HCT 34.7*  PLT 185   ------------------------------------------------------------------------------------------------------------------  Chemistries   Recent Labs Lab 03/03/15 1501  NA 133*  K 3.4*  CL 87*  CO2 33*  GLUCOSE 159*  BUN 18  CREATININE 1.17*  CALCIUM 8.7*  AST 22  ALT 13*  ALKPHOS 61  BILITOT 1.1   ------------------------------------------------------------------------------------------------------------------  Cardiac Enzymes  Recent Labs Lab 03/03/15 1501  TROPONINI 0.05*   ------------------------------------------------------------------------------------------------------------------  RADIOLOGY:  Dg Chest 2 View  03/03/2015   CLINICAL DATA:  Pneumonia and shortness of breath. Former smoker. History of breast cancer.  EXAM: CHEST  2 VIEW  COMPARISON:  PA and lateral chest 02/16/2015 and 05/20/2014. CT chest and PA and lateral chest 03/15/2014.  FINDINGS: The lungs are emphysematous. The patient is status post bilateral mastectomy. Surgical clips left axilla are noted. The lungs are clear. Heart size is normal. No pneumothorax or pleural effusion. Remote lower thoracic spine compression fractures are unchanged.  IMPRESSION: Emphysema without acute disease.   Electronically Signed   By: Inge Rise M.D.   On: 03/03/2015 15:24    EKG:   A fib IMPRESSION AND PLAN:   81 year old Mrs. Trudel with past medical history of chronic respiratory failure secondary to COPD on home oxygen, history of cardiac myopathy, esophageal stricture status post", DJD, comes to the emergency room with increasing shortness of breath for last  several days worsened today with productive phlegm. She was found to be hypoxic sats in the upper 80s. She is being admitted with  #1 acute on chronic hypoxic respiratory failure secondary to COPD exacerbation with acute bronchitis Admit to MedSurg floor IV Solu-Medrol 40 mg every 8 hourly Nebulizer when necessary, inhalers Empiric by mouth Levaquin Tessalon Perles when necessary  #2 history of dysphagia with history of a sebaceous stricture in the past. Patient reports experiencing some difficulty swallowing. She is a recommended to take soft diet. She is asked to follow-up with Dr. Allen Norris as outpatient.  #3 history of chronic A. fib on Xarelto  #4 peripheral vascular disease follows with vascular as outpatient  #5 DVT prophylaxis on Xarelto  #6 generalized weakness deconditioning will have physical therapy see patient.  Care management for  discharge planning   All the records are reviewed and case discussed with ED provider. Management plans discussed with the patient, family and they are in agreement.  CODE STATUS: Full  TOTAL TIME TAKING CARE OF THIS PATIENT: 50 minutes.    Naftali Carchi M.D on 03/03/2015 at 7:32 PM  Between 7am to 6pm - Pager - [phone]  After 6pm go to [email or web address] - password [email or web address] [facility]  Office  [phone]  CC: Primary care physician; [name], MD

## 2015-03-03 NOTE — ED Notes (Signed)
Pt to ED with c/o sob and productive cough x 1 week, states she has hx of COPD and gets pneumonia often, pt is on home o2 at home, 02 sat on RA in triage is 87%, pt placed on 2 liters [place]

## 2015-03-04 DIAGNOSIS — J40 Bronchitis, not specified as acute or chronic: Secondary | ICD-10-CM | POA: Diagnosis not present

## 2015-03-04 DIAGNOSIS — R0902 Hypoxemia: Secondary | ICD-10-CM | POA: Diagnosis not present

## 2015-03-04 DIAGNOSIS — J441 Chronic obstructive pulmonary disease with (acute) exacerbation: Secondary | ICD-10-CM | POA: Diagnosis not present

## 2015-03-04 DIAGNOSIS — I10 Essential (primary) hypertension: Secondary | ICD-10-CM | POA: Diagnosis not present

## 2015-03-04 LAB — MRSA PCR SCREENING: MRSA BY PCR: NEGATIVE

## 2015-03-04 NOTE — Progress Notes (Signed)
Physical Therapy Evaluation Patient Details Name: ALEXIAH KOROMA MRN: [id number] DOB: Nov 04, 1922 Today's Date: 03/04/2015   History of Present Illness  Patient is a 81 y.o. female admitted on 23 September for SOB. PMH includes HTN, cardiomyopathy.  Clinical Impression  Patient is a pleasant 81 y.o. female who appears to be at her baseline level of functioning. Patient was able to pace herself during ambulation, keeping oxygen saturations >92%. Patient did have tendency to use walls/furniture to maintain balance when available. Patient has [place] available at home for use. PT educated patient about proper DME use and encouraged her to use it to prevent LOB/falls in the future.    Follow Up Recommendations No PT follow up    Equipment Recommendations  None recommended by PT;Other (comment) (Patient has SPC)    Recommendations for Other Services       Precautions / Restrictions Precautions Precautions: Fall Restrictions Weight Bearing Restrictions: No      Mobility  Bed Mobility                  Transfers Overall transfer level: Independent Equipment used: None                Ambulation/Gait Ambulation/Gait assistance: Min guard Ambulation Distance (Feet): 130 Feet Assistive device: None Gait Pattern/deviations: Decreased stride length     General Gait Details: Patient ambulates at decreased cadence with 3L O2, taking standing rest breaks as necessary. O2 saturations >92% pre and post ambulation. Patient had tendency to use walls/furniture when available. Improved steadiness with HHA.  Stairs            Wheelchair Mobility    Modified Rankin (Stroke Patients Only)       Balance Overall balance assessment: Independent                                           Pertinent Vitals/Pain Pain Assessment: No/denies pain    Home Living Family/patient expects to be discharged to:: Private residence Living Arrangements: Alone Available  Help at Discharge: Friend(s) Type of Home: House Home Access: Level entry     Home Layout: One level Home Equipment: [place] - single point Additional Comments: Patient has [place] but has not used it in the recent past    Prior Function Level of Independence: Independent;Needs assistance   Gait / Transfers Assistance Needed: Independent  ADL's / Homemaking Assistance Needed: Friend performs grocery shopping/takes to appointments        Hand Dominance        Extremity/Trunk Assessment   Upper Extremity Assessment: Overall WFL for tasks assessed           Lower Extremity Assessment: Overall WFL for tasks assessed      Cervical / Trunk Assessment: Kyphotic  Communication   Communication: No difficulties  Cognition Arousal/Alertness: Awake/alert Behavior During Therapy: WFL for tasks assessed/performed Overall Cognitive Status: Within Functional Limits for tasks assessed                      General Comments      Exercises        Assessment/Plan    PT Assessment Patent does not need any further PT services  PT Diagnosis Difficulty walking   PT Problem List    PT Treatment Interventions     PT Goals (Current goals can be found in the Care Plan  section) Acute Rehab PT Goals Patient Stated Goal: "To feel myself again" PT Goal Formulation: With patient Time For Goal Achievement: 03/18/15 Potential to Achieve Goals: Good    Frequency     Barriers to discharge        Co-evaluation               End of Session Equipment Utilized During Treatment: Gait belt;Oxygen Activity Tolerance: Patient tolerated treatment well Patient left: in chair;with call bell/phone within reach;with chair alarm set Nurse Communication: Mobility status         Time: 1510-1530 PT Time Calculation (min) (ACUTE ONLY): 20 min   Charges:   PT Evaluation $Initial PT Evaluation Tier I: 1 Procedure     PT G Codes:        Dorice Lamas, PT, DPT 03/04/2015,  4:03 PM

## 2015-03-04 NOTE — Consult Note (Signed)
GI Inpatient Consult Note [name] MD  Reason for Consult: Dysphagia   Attending Requesting Consult: Dustin Flock, MD    Outpatient Primary Physician:[name] Philbert Riser, MD    History of Present Illness: [name] [name] is a 81 y.o. female with a previous history of dysphagia. He was admitted to the hospital with bronchitis and exacerbation of chronic respiratory failure in the setting of  COPD. Came to the emergency room yesterday stating that she "couldn't breathe". He was admitted last night. Has a history of dysphagia in the past.  He states that she occasionally gets some epigastric discomfort and some nausea but no vomiting. She has had to be more careful with her eating over the past couple of months. She has dysphagia both to solid foods as well as water. She has had at least 2 dilatations 1 on 11/05/2012 showing a ring about 2 cm above the gastroesophageal junction a second done on 10/12/2013 showing a mild stenosis in the lower third of the esophagus. A barium esophagram done 10/26/2012 showed evidence of silent laryngeal penetration no gross aspiration in the tracheal bronchial tree, and a dedicated speech therapy evaluation was advised.  She does have problems with heartburn and takes a proton pump inhibitor in the morning and H2 receptor antagonist in the evening. Take medications immediately before going to bed. Bowel habits are variable but there is a fair amount of problems with constipation. She has seen no blood in the stool black stools or slimy stools.   Past Medical History:  Past Medical History  Diagnosis Date  . HTN (hypertension)   . COPD (chronic obstructive pulmonary disease)     a. on home O2 nightly.  . Diverticulosis   . Takotsubo cardiomyopathy 10/2012    a. 10/2012: presented w/ cardiogenic shock/systolic CHF/VDRF - EF 65% by cath, 40% by f/u echo - cath with mild nonobstructive CAD. b. Not discharged on ACEI due to hypotension.  . Cardiogenic shock     a. 10/2012 - due to Takotsubo CM.  [name] Respiratory failure     a. On home O2 nightly. b. VDRF 10/2012 - due to CAP/pulm edema in setting of cardiogenic shock/Takotsubo CM.  [name] CAP (community acquired pneumonia)     a. 10/2012 - will need f/u CXR in June 2014 to ensure clearance.  . Esophageal stricture     a. s/p dilitation 10/2012.  [name] Pleural effusion, right     a. s/p thoracentesis 10/28/12.  . Atrial fibrillation     a. Dx 10/2012, rate controlled, started on Xarelto.  . Claudication     a. ABI 10/2012 - R 0.87, L 0.74.  [name] GERD (gastroesophageal reflux disease)   . CKD (chronic kidney disease), stage III   . Breast CA     hx  . Abdominal hernia     chronic  . CAD (coronary artery disease)     a. 10/2012 - presented w/ STEMI felt due to Takotsubo - mild nonobstructive dz by cath.  [name] STEMI (ST elevation myocardial infarction)   . Cellulitis     Problem List: Patient Active Problem List   Diagnosis Date Noted  . Acute on chronic respiratory failure with hypoxemia 03/03/2015  . Essential hypertension 04/08/2014  . Chronic systolic heart failure 78/46/9629  . PAD (peripheral artery disease) 12/07/2012  . Atrial fibrillation 10/31/2012  . Pleural effusion 10/29/2012  . Acute systolic CHF (congestive heart failure) 10/28/2012  . Acute respiratory failure 10/28/2012  . Hypokalemia 10/28/2012  .  Cardiogenic shock 10/28/2012  . GERD (gastroesophageal reflux disease) 10/28/2012  . Esophageal stricture 10/28/2012  . Community acquired pneumonia 10/26/2012  . Takotsubo syndrome 10/25/2012    Past Surgical History: Past Surgical History  Procedure Laterality Date  . Mastectomy    . Cataract extraction    . Esophagogastroduodenoscopy N/A 11/05/2012    Procedure: ESOPHAGOGASTRODUODENOSCOPY (EGD) with esophageal savary dilatation;  Surgeon: Missy Sabins, MD;  Location: [facility];  Service: Endoscopy;  Laterality: N/A;  savary dil...no floro needed  . Bladder surgery    . Left heart  catheterization with coronary angiogram N/A 10/24/2012    Procedure: LEFT HEART CATHETERIZATION WITH CORONARY ANGIOGRAM;  Surgeon: Sherren Mocha, MD;  Location: [facility] C[facility] LAB;  Service: Cardiovascular;  Laterality: N/A;  . Coronary angioplasty    . Peripheral vascular catheterization N/A 02/07/2015    Procedure: Abdominal Aortogram w/Lower Extremity;  Surgeon: Katha Cabal, MD;  Location: [facility] CV LAB;  Service: Cardiovascular;  Laterality: N/A;  . Peripheral vascular catheterization  02/07/2015    Procedure: Lower Extremity Intervention;  Surgeon: Katha Cabal, MD;  Location: [place] CV LAB;  Service: Cardiovascular;;    Allergies: Allergies  Allergen Reactions  . Penicillins Itching, Swelling, Rash and Other (See Comments)    Pt is unable to answer additional questions about this medication because it happened so long ago.  . Sulfa Antibiotics Other (See Comments)    Reaction:  Unknown     Home Medications: Prescriptions prior to admission  Medication Sig Dispense Refill Last Dose  . albuterol (PROVENTIL HFA;VENTOLIN HFA) 108 (90 BASE) MCG/ACT inhaler Inhale 2 puffs into the lungs every 4 (four) hours as needed for wheezing or shortness of breath.   03/03/2015 at Unknown time  . albuterol (PROVENTIL) (2.5 MG/3ML) 0.083% nebulizer solution Take 2.5 mg by nebulization 4 (four) times daily as needed for wheezing or shortness of breath.   03/03/2015 at Unknown time  . benzonatate (TESSALON) 100 MG capsule Take 1 capsule (100 mg total) by mouth 2 (two) times daily as needed for cough. 30 capsule 1 03/02/2015 at Unknown time  . carvedilol (COREG) 6.25 MG tablet Take 6.25 mg by mouth 2 (two) times daily with a meal.   03/03/2015 at 0900  . Fluticasone-Salmeterol (ADVAIR) 250-50 MCG/DOSE AEPB Inhale 1 puff into the lungs 2 (two) times daily.   03/03/2015 at Unknown time  . furosemide (LASIX) 40 MG tablet Take 80 mg by mouth 2 (two) times daily.   03/03/2015 at Unknown time  .  loratadine (CLARITIN) 10 MG tablet Take 10 mg by mouth daily.   03/03/2015 at Unknown time  . omeprazole (PRILOSEC) 20 MG capsule Take 1 capsule (20 mg total) by mouth daily. 30 capsule 11 03/03/2015 at Unknown time  . pyridOXINE (VITAMIN B-6) 100 MG tablet Take 100 mg by mouth daily.   03/03/2015 at Unknown time  . ranitidine (ZANTAC) 150 MG tablet Take 150 mg by mouth at bedtime.   03/02/2015 at Unknown time  . Rivaroxaban (XARELTO) 15 MG TABS tablet Take 15 mg by mouth at bedtime.   03/02/2015 at 1900  . tiotropium (SPIRIVA) 18 MCG inhalation capsule Place 18 mcg into inhaler and inhale daily.   03/03/2015 at Unknown time  . vitamin B-12 (CYANOCOBALAMIN) 1000 MCG tablet Take 1,000 mcg by mouth daily.   03/03/2015 at Unknown time  . gabapentin (NEURONTIN) 100 MG capsule Take 1 capsule (100 mg total) by mouth 3 (three) times daily. (Patient not taking: Reported on 03/03/2015)  90 capsule 3 Taking   Home medication reconciliation was completed with the patient.   Scheduled Inpatient Medications:   . carvedilol  6.25 mg Oral BID WC  . famotidine  20 mg Oral Daily  . furosemide  80 mg Oral BID  . levofloxacin  250 mg Oral Daily  . loratadine  10 mg Oral Daily  . methylPREDNISolone (SOLU-MEDROL) injection  40 mg Intravenous Q8H  . mometasone-formoterol  2 puff Inhalation BID  . pantoprazole  40 mg Oral Daily  . pyridOXINE  100 mg Oral Daily  . Rivaroxaban  15 mg Oral QHS  . tiotropium  18 mcg Inhalation Daily  . vitamin B-12  1,000 mcg Oral Daily    Continuous Inpatient Infusions:     PRN Inpatient Medications:  acetaminophen **OR** acetaminophen, albuterol, benzonatate, HYDROcodone-acetaminophen, ondansetron **OR** ondansetron (ZOFRAN) IV, senna-docusate  Family History: family history includes Heart attack in her mother.   GI Family History: Grandfather with peptic ulcer disease  Social History:   reports that she has quit smoking. Her smoking use included Cigarettes. She has a 55  pack-year smoking history. She has quit using smokeless tobacco. She reports that she does not drink alcohol or use illicit drugs. ROS  Review of Systems: 10 systems reviewed per admission history and physical agree with same  Physical Examination: BP 120/61 mmHg  Pulse 62  Temp(Src) 98.4 F (36.9 C) (Oral)  Resp 18  Ht 5\' 6"  (1.676 m)  Wt 67.132 kg (148 lb)  BMI 23.90 kg/m2  SpO2 44% Gen: 50 -year-old female no acute distress HEENT: Normocephalic atraumatic eyes are anicteric Neck: No JVD Chest: Coarse occasional wheeze and rhonchi CV: Regular rate and rhythm Abd: Soft tenderness to palpation the epigastric region bowel sounds are positive and normoactive Ext: Clubbing or cyanosis Skin: Dry Other:  Data: Lab Results  Component Value Date   WBC 11.5* 03/03/2015   HGB 11.7* 03/03/2015   HCT 34.7* 03/03/2015   MCV 93.8 03/03/2015   PLT 185 03/03/2015    Recent Labs Lab 03/03/15 1501  HGB 11.7*   Lab Results  Component Value Date   NA 133* 03/03/2015   K 3.4* 03/03/2015   CL 87* 03/03/2015   CO2 33* 03/03/2015   BUN 18 03/03/2015   CREATININE 1.17* 03/03/2015   Lab Results  Component Value Date   ALT 13* 03/03/2015   AST 22 03/03/2015   ALKPHOS 61 03/03/2015   BILITOT 1.1 03/03/2015   No results for input(s): APTT, INR, PTT in the last 168 hours. CBC Latest Ref Rng 03/03/2015 05/26/2014 03/19/2014  WBC 3.6 - 11.0 K/uL 11.5(H) 5.9 -  Hemoglobin 12.0 - 16.0 g/dL 11.7(L) 12.3 12.2  Hematocrit 35.0 - 47.0 % 34.7(L) 36.4 -  Platelets 150 - 440 K/uL 185 218 -    STUDIES: Dg Chest 2 View  03/03/2015   CLINICAL DATA:  Pneumonia and shortness of breath. Former smoker. History of breast cancer.  EXAM: CHEST  2 VIEW  COMPARISON:  PA and lateral chest 02/16/2015 and 05/20/2014. CT chest and PA and lateral chest 03/15/2014.  FINDINGS: The lungs are emphysematous. The patient is status post bilateral mastectomy. Surgical clips left axilla are noted. The lungs are  clear. Heart size is normal. No pneumothorax or pleural effusion. Remote lower thoracic spine compression fractures are unchanged.  IMPRESSION: Emphysema without acute disease.   Electronically Signed   By: Inge Rise M.D.   On: 03/03/2015 15:24   @IMAGES @  Assessment: 1. Dysphagia. This is  a recurrent problem for her. 2. Barium esophagram as noted above has findings of possible silent laryngeal penetration inferring high risk for aspiration. Recommendations: 1. She should have another EGD however with the current clinical situation of bronchitis and respiratory distress should have 2 weeks to recover from this and finished her treatment before attempting a sedated procedure. I would continue a daily proton pump inhibitor. It is of note the patient takes a relatively low and she will need to be off of that agent for at least 48-72 hours in regards to her history of renal insufficiency prior to inserting dilatation. He has seen Dr.Wohl in the past for her last dilatation. I would be happy to see her as an outpatient as well. 2. Consider SLP evaluation while she is in the hospital.   Thank you for the consult. Please call with questions or concerns.  [name], MD  03/04/2015 6:44 PM

## 2015-03-04 NOTE — Progress Notes (Signed)
[facility]                                                                                                                                                                                            Patient Demographics   [name] [name], is a 81 y.o. female, DOB - June 17, 1922, RXV:[id number]  Admit date - 03/03/2015   Admitting Physician Fritzi Mandes, MD  Outpatient Primary MD for the patient is [name], MD   LOS - 1  Subjective: Patient is breathing is improved. She complains of severe dysphagia she reports that she has a history of having a stricture and has been told by her GI doctor that she will need this stretched. This morning she states that she was trying to eat and could not swallow at all. It took her longtime to swallow her food.     Review of Systems:   CONSTITUTIONAL: No documented fever. No fatigue, weakness. No weight gain, no weight loss.  EYES: No blurry or double vision.  ENT: No tinnitus. No postnasal drip. No redness of the oropharynx.  RESPIRATORY: No cough, no wheeze, no hemoptysis. Positive dyspnea.  CARDIOVASCULAR: No chest pain. No orthopnea. No palpitations. No syncope.  GASTROINTESTINAL: No nausea, no vomiting or diarrhea. No abdominal pain. No melena or hematochezia. Dysphagia GENITOURINARY: No dysuria or hematuria.  ENDOCRINE: No polyuria or nocturia. No heat or cold intolerance.  HEMATOLOGY: No anemia. No bruising. No bleeding.  INTEGUMENTARY: No rashes. No lesions.  MUSCULOSKELETAL: No arthritis. No swelling. No gout.  NEUROLOGIC: No numbness, tingling, or ataxia. No seizure-type activity.  PSYCHIATRIC: No anxiety. No insomnia. No ADD.    Vitals:   Filed Vitals:   03/03/15 2100 03/04/15 0449 03/04/15 0451 03/04/15 0844  BP: 112/45  107/50 114/58  Pulse: 76 60 65 64  Temp: 98.1 F (36.7 C) 98 F (36.7 C)  97.6 F (36.4 C)  TempSrc: Oral Oral  Oral  Resp: 23 22  18   Height:      Weight:       SpO2: 96% 94% 93% 96%    Wt Readings from Last 3 Encounters:  03/03/15 67.132 kg (148 lb)  02/16/15 68.947 kg (152 lb)  02/07/15 71.668 kg (158 lb)     Intake/Output Summary (Last 24 hours) at 03/04/15 1213 Last data filed at 03/04/15 1103  Gross per 24 hour  Intake      0 ml  Output    700 ml  Net   -700 ml    Physical Exam:   GENERAL: Pleasant-appearing in no apparent distress.  HEAD, EYES, EARS,  NOSE AND THROAT: Atraumatic, normocephalic. Extraocular muscles are intact. Pupils equal and reactive to light. Sclerae anicteric. No conjunctival injection. No oro-pharyngeal erythema.  NECK: Supple. There is no jugular venous distention. No bruits, no lymphadenopathy, no thyromegaly.  HEART: Regular rate and rhythm,. No murmurs, no rubs, no clicks.  LUNGS: Clear to auscultation bilaterally. No rales or rhonchi. No wheezes.  ABDOMEN: Soft, flat, nontender, nondistended. Has good bowel sounds. No hepatosplenomegaly appreciated.  EXTREMITIES: No evidence of any cyanosis, clubbing, or peripheral edema.  +2 pedal and radial pulses bilaterally.  NEUROLOGIC: The patient is alert, awake, and oriented x3 with no focal motor or sensory deficits appreciated bilaterally.  SKIN: Moist and warm with no rashes appreciated.  Psych: Not anxious, depressed LN: No inguinal LN enlargement    Antibiotics   Anti-infectives    Start     Dose/Rate Route Frequency Ordered Stop   03/04/15 1000  levofloxacin (LEVAQUIN) tablet 250 mg     250 mg Oral Daily 03/03/15 1917     03/03/15 1915  levofloxacin (LEVAQUIN) tablet 500 mg     500 mg Oral  Once 03/03/15 1903 03/03/15 2012      Medications   Scheduled Meds: . carvedilol  6.25 mg Oral BID WC  . famotidine  20 mg Oral Daily  . furosemide  80 mg Oral BID  . levofloxacin  250 mg Oral Daily  . loratadine  10 mg Oral Daily  . methylPREDNISolone (SOLU-MEDROL) injection  40 mg Intravenous Q8H  . mometasone-formoterol  2 puff Inhalation BID  .  pantoprazole  40 mg Oral Daily  . pneumococcal 23 valent vaccine  0.5 mL Intramuscular Tomorrow-1000  . pyridOXINE  100 mg Oral Daily  . Rivaroxaban  15 mg Oral QHS  . tiotropium  18 mcg Inhalation Daily  . vitamin B-12  1,000 mcg Oral Daily   Continuous Infusions:  PRN Meds:.acetaminophen **OR** acetaminophen, albuterol, benzonatate, HYDROcodone-acetaminophen, ondansetron **OR** ondansetron (ZOFRAN) IV, senna-docusate   Data Review:   Micro Results Recent Results (from the past 240 hour(s))  MRSA PCR Screening     Status: None   Collection Time: 03/04/15  8:30 AM  Result Value Ref Range Status   MRSA by PCR NEGATIVE NEGATIVE Final    Comment:        The GeneXpert MRSA Assay (FDA approved for NASAL specimens only), is one component of a comprehensive MRSA colonization surveillance program. It is not intended to diagnose MRSA infection nor to guide or monitor treatment for MRSA infections.     Radiology Reports Dg Chest 2 View  03/03/2015   CLINICAL DATA:  Pneumonia and shortness of breath. Former smoker. History of breast cancer.  EXAM: CHEST  2 VIEW  COMPARISON:  PA and lateral chest 02/16/2015 and 05/20/2014. CT chest and PA and lateral chest 03/15/2014.  FINDINGS: The lungs are emphysematous. The patient is status post bilateral mastectomy. Surgical clips left axilla are noted. The lungs are clear. Heart size is normal. No pneumothorax or pleural effusion. Remote lower thoracic spine compression fractures are unchanged.  IMPRESSION: Emphysema without acute disease.   Electronically Signed   By: Inge Rise M.D.   On: 03/03/2015 15:24   Dg Chest 2 View  02/16/2015   CLINICAL DATA:  81 year old female with pain radiating under the left breast. Intermittent episodes. Initial encounter.  EXAM: CHEST  2 VIEW  COMPARISON:  05/20/2014 and earlier.  FINDINGS: Stable postoperative changes to the left axilla and chest wall. Chronic calcified atherosclerosis of the aorta  and  subclavian/ axillary artery. Chronic confluent biapical pulmonary scarring greater on the left. Stable cardiomegaly. Other mediastinal contours are stable. Stable large lung volumes. No pneumothorax, pulmonary edema or acute pulmonary opacity. No definite pleural effusion. Osteopenia. No acute osseous abnormality identified.  IMPRESSION: Chronic findings.  No acute cardiopulmonary abnormality.   Electronically Signed   By: Genevie Ann M.D.   On: 02/16/2015 12:01     CBC  Recent Labs Lab 03/03/15 1501  WBC 11.5*  HGB 11.7*  HCT 34.7*  PLT 185  MCV 93.8  MCH 31.5  MCHC 33.6  RDW 14.0    Chemistries   Recent Labs Lab 03/03/15 1501  NA 133*  K 3.4*  CL 87*  CO2 33*  GLUCOSE 159*  BUN 18  CREATININE 1.17*  CALCIUM 8.7*  AST 22  ALT 13*  ALKPHOS 61  BILITOT 1.1   ------------------------------------------------------------------------------------------------------------------ estimated creatinine clearance is 29.9 mL/min (by C-G formula based on Cr of 1.17). ------------------------------------------------------------------------------------------------------------------ No results for input(s): HGBA1C in the last 72 hours. ------------------------------------------------------------------------------------------------------------------ No results for input(s): CHOL, HDL, LDLCALC, TRIG, CHOLHDL, LDLDIRECT in the last 72 hours. ------------------------------------------------------------------------------------------------------------------ No results for input(s): TSH, T4TOTAL, T3FREE, THYROIDAB in the last 72 hours.  Invalid input(s): FREET3 ------------------------------------------------------------------------------------------------------------------ No results for input(s): VITAMINB12, FOLATE, FERRITIN, TIBC, IRON, RETICCTPCT in the last 72 hours.  Coagulation profile No results for input(s): INR, PROTIME in the last 168 hours.  No results for input(s): DDIMER in the  last 72 hours.  Cardiac Enzymes  Recent Labs Lab 03/03/15 1501  TROPONINI 0.05*   ------------------------------------------------------------------------------------------------------------------ Invalid input(s): POCBNP    Assessment & Plan   81 year old Mrs. Sayed with past medical history of chronic respiratory failure secondary to COPD on home oxygen, history of cardiac myopathy, esophageal stricture status post", DJD, comes to the emergency room with increasing shortness of breath for last several days worsened today with productive phlegm. She was found to be hypoxic sats in the upper 80s. She is being admitted with  #1 acute on chronic hypoxic respiratory failure secondary to COPD exacerbation with acute bronchitis Continue IV Solu-Medrol 40 mg every 8 hourly Nebulizer when necessary, inhalers Empiric by mouth Levaquin Tessalon Perles when necessary  #2 history of dysphagia with history of a esophageal stricture in the past. Worsening symptoms GI evaluate  #3 history of chronic A. fib on Xarelto  #4 peripheral vascular disease follows with vascular as outpatient  #5 DVT prophylaxis on Xarelto  #6 generalized weakness deconditioning PT evaluate     Code Status Orders        Start     Ordered   03/03/15 2006  Full code   Continuous     03/03/15 2005           Consults  GI   DVT Prophylaxis  Xarelto  Lab Results  Component Value Date   PLT 185 03/03/2015     Time Spent in minutes   35 minutes   Dustin Flock M.D on 03/04/2015 at 12:13 PM  Between 7am to 6pm - Pager - [phone]  After 6pm go to [email or web address] - password [email or web address] [facility]   Office  [phone]

## 2015-03-04 NOTE — Progress Notes (Signed)
Pneumonia vaccine given to pt however computer was not allowing me to log in and syringe thrown in sharps box. Pharmacy contacted but unable to assist in a resolution.

## 2015-03-04 NOTE — Progress Notes (Signed)
   03/04/15 1000  Clinical Encounter Type  Visited With Patient  Visit Type Initial  Referral From Nurse  Consult/Referral To Chaplain  Spiritual Encounters  Spiritual Needs Literature;Prayer  Stress Factors  Patient Stress Factors Exhausted;Health changes;Major life changes  Advance Directives (For Healthcare)  Does patient have an advance directive? No  Would patient like information on creating an advanced directive? Yes - Scientist, clinical (histocompatibility and immunogenetics) given  Met w/patient requesting AD education & materials. Explained the details of [facility] [place]. Provided documents for review. Patient plans to review with family and contact chaplains when she is ready to pursue completion.  [place]

## 2015-03-05 DIAGNOSIS — R1310 Dysphagia, unspecified: Secondary | ICD-10-CM | POA: Diagnosis not present

## 2015-03-05 DIAGNOSIS — R10816 Epigastric abdominal tenderness: Secondary | ICD-10-CM | POA: Diagnosis not present

## 2015-03-05 DIAGNOSIS — I10 Essential (primary) hypertension: Secondary | ICD-10-CM | POA: Diagnosis not present

## 2015-03-05 DIAGNOSIS — J40 Bronchitis, not specified as acute or chronic: Secondary | ICD-10-CM | POA: Diagnosis not present

## 2015-03-05 DIAGNOSIS — R12 Heartburn: Secondary | ICD-10-CM | POA: Diagnosis not present

## 2015-03-05 DIAGNOSIS — J441 Chronic obstructive pulmonary disease with (acute) exacerbation: Secondary | ICD-10-CM | POA: Diagnosis not present

## 2015-03-05 DIAGNOSIS — R110 Nausea: Secondary | ICD-10-CM | POA: Diagnosis not present

## 2015-03-05 DIAGNOSIS — R0902 Hypoxemia: Secondary | ICD-10-CM | POA: Diagnosis not present

## 2015-03-05 MED ORDER — GUAIFENESIN ER 600 MG PO TB12
600.0000 mg | ORAL_TABLET | Freq: Two times a day (BID) | ORAL | Status: DC
  Administered 2015-03-05 – 2015-03-08 (×7): 600 mg via ORAL
  Filled 2015-03-05 (×7): qty 1

## 2015-03-05 MED ORDER — ZOLPIDEM TARTRATE 5 MG PO TABS
5.0000 mg | ORAL_TABLET | Freq: Every evening | ORAL | Status: DC | PRN
  Administered 2015-03-05 – 2015-03-07 (×3): 5 mg via ORAL
  Filled 2015-03-05 (×3): qty 1

## 2015-03-05 NOTE — Progress Notes (Signed)
RN ambulated with patient 1x around nurses station with portable o2. Pt. Tolerated well, no c/o SOB or dizziness.

## 2015-03-05 NOTE — Consult Note (Signed)
Subjective: Patient seen for dysphagia. She states that he is currently tolerating her diet by mashing things up a bit cutting meat into pieces. She denies any nausea vomiting or abdominal pain.  Objective: Vital signs in last 24 hours: Temp:  [97.5 F (36.4 C)-98 F (36.7 C)] 98 F (36.7 C) (09/25 1153) Pulse Rate:  [58] 58 (09/25 1153) Resp:  [18-20] 18 (09/25 1153) BP: (116-117)/(54-56) 117/54 mmHg (09/25 1153) SpO2:  [95 %-98 %] 98 % (09/25 1153) Blood pressure 117/54, pulse 58, temperature 98 F (36.7 C), temperature source Oral, resp. rate 18, height 5\' 6"  (1.676 m), weight 67.132 kg (148 lb), SpO2 98 %.   Intake/Output from previous day: 09/24 0701 - 09/25 0700 In: 240 [P.O.:240] Out: 1750 [Urine:1750]  Intake/Output this shift:     General appearance:  81 year old female no acute distress Resp:  Course wheezes and rhonchi intermittent Cardio:  Regular rate and rhythm GI:  Soft nontender nondistended bowel sounds positive and normoactive Extremities:     Lab Results: No results found for this or any previous visit (from the past 24 hour(s)).    Recent Labs  03/03/15 1501  WBC 11.5*  HGB 11.7*  HCT 34.7*  PLT 185   BMET  Recent Labs  03/03/15 1501  NA 133*  K 3.4*  CL 87*  CO2 33*  GLUCOSE 159*  BUN 18  CREATININE 1.17*  CALCIUM 8.7*   LFT  Recent Labs  03/03/15 1501  PROT 7.8  ALBUMIN 3.6  AST 22  ALT 13*  ALKPHOS 61  BILITOT 1.1   PT/INR No results for input(s): LABPROT, INR in the last 72 hours. Hepatitis Panel No results for input(s): HEPBSAG, HCVAB, HEPAIGM, HEPBIGM in the last 72 hours. C-Diff No results for input(s): CDIFFTOX in the last 72 hours. No results for input(s): CDIFFPCR in the last 72 hours.   Studies/Results: No results found.  Scheduled Inpatient Medications:   . carvedilol  6.25 mg Oral BID WC  . famotidine  20 mg Oral Daily  . furosemide  80 mg Oral BID  . guaiFENesin  600 mg Oral BID  . levofloxacin   250 mg Oral Daily  . loratadine  10 mg Oral Daily  . methylPREDNISolone (SOLU-MEDROL) injection  40 mg Intravenous Q8H  . mometasone-formoterol  2 puff Inhalation BID  . pantoprazole  40 mg Oral Daily  . pyridOXINE  100 mg Oral Daily  . Rivaroxaban  15 mg Oral QHS  . tiotropium  18 mcg Inhalation Daily  . vitamin B-12  1,000 mcg Oral Daily    Continuous Inpatient Infusions:     PRN Inpatient Medications:  acetaminophen **OR** acetaminophen, albuterol, benzonatate, HYDROcodone-acetaminophen, ondansetron **OR** ondansetron (ZOFRAN) IV, senna-docusate, zolpidem  Miscellaneous:   Assessment:  1. Dysphagia in the setting of known history of same. He has had distal esophageal dilation on several occasions. Of interest there is likely esophageal motility disorder noted on barium study done in 2014 as well as some amount of silent aspiration.  Plan:  1. Would do a barium swallow if tolerated tomorrow. 2. She is currently not a good candidate for sedated EGD. I would recommend seeing her current treatment for her bronchitis. Would likely much better tolerate sedation for a EGD and dilatation in about 2 weeks. She can follow-up with either me or Dr. Allen Norris who she has seen in the past. 3. Following  [name] MD 03/05/2015, 8:22 PM

## 2015-03-05 NOTE — Progress Notes (Signed)
Patient requesting something to help her sleep tonight. Dr. Posey Pronto paged and RN received verbal order for PRN ambien.

## 2015-03-05 NOTE — Progress Notes (Signed)
While napping, patients HR dipped down into the mid-30s. Pt. Asleep in recliner and in no apparent distress. HR increased to 50s-60s after 30-60 seconds.

## 2015-03-05 NOTE — Progress Notes (Signed)
Patient had 2 sec. pause on telemetry. Dr. Posey Pronto paged and made aware. No new orders. Patient is sleeping in recliner chair in room, no distress noted.

## 2015-03-05 NOTE — Progress Notes (Signed)
[place] at [facility]                                                                                                                                                                                            Patient Demographics   [name] [name], is a 81 y.o. female, DOB - [birth year], [id number]  Admit date - 03/03/2015   Admitting Physician Fritzi Mandes, MD  Outpatient Primary MD for the patient is [name], MD   LOS - 2  Subjective: Patient continues to complain of some shortness of breath but improved seen by GI for dysphagia    Review of Systems:   CONSTITUTIONAL: No documented fever. No fatigue, weakness. No weight gain, no weight loss.  EYES: No blurry or double vision.  ENT: No tinnitus. No postnasal drip. No redness of the oropharynx.  RESPIRATORY: No cough, no wheeze, no hemoptysis. Positive dyspnea.  CARDIOVASCULAR: No chest pain. No orthopnea. No palpitations. No syncope.  GASTROINTESTINAL: No nausea, no vomiting or diarrhea. No abdominal pain. No melena or hematochezia. Dysphagia GENITOURINARY: No dysuria or hematuria.  ENDOCRINE: No polyuria or nocturia. No heat or cold intolerance.  HEMATOLOGY: No anemia. No bruising. No bleeding.  INTEGUMENTARY: No rashes. No lesions.  MUSCULOSKELETAL: No arthritis. No swelling. No gout.  NEUROLOGIC: No numbness, tingling, or ataxia. No seizure-type activity.  PSYCHIATRIC: No anxiety. No insomnia. No ADD.    Vitals:   Filed Vitals:   03/04/15 1227 03/04/15 1641 03/04/15 1953 03/05/15 0421  BP: 108/43 120/61 123/47 116/56  Pulse: 72 62 64 58  Temp:  98.4 F (36.9 C) 97.8 F (36.6 C) 97.5 F (36.4 C)  TempSrc:  Oral Oral Oral  Resp:  18 18 20   Height:      Weight:      SpO2: 100% 98% 91% 95%    Wt Readings from Last 3 Encounters:  03/03/15 67.132 kg (148 lb)  02/16/15 68.947 kg (152 lb)  02/07/15 71.668 kg (158 lb)     Intake/Output Summary (Last 24 hours) at 03/05/15  1117 Last data filed at 03/05/15 0950  Gross per 24 hour  Intake    720 ml  Output   1350 ml  Net   -630 ml    Physical Exam:   GENERAL: Pleasant-appearing in no apparent distress.  HEAD, EYES, EARS, NOSE AND THROAT: Atraumatic, normocephalic. Extraocular muscles are intact. Pupils equal and reactive to light. Sclerae anicteric. No conjunctival injection. No oro-pharyngeal erythema.  NECK: Supple. There is no jugular venous distention. No bruits, no lymphadenopathy, no thyromegaly.  HEART: Regular rate and rhythm,. No murmurs, no  rubs, no clicks.  LUNGS: Clear to auscultation bilaterally. No rales or rhonchi. No wheezes.  ABDOMEN: Soft, flat, nontender, nondistended. Has good bowel sounds. No hepatosplenomegaly appreciated.  EXTREMITIES: No evidence of any cyanosis, clubbing, or peripheral edema.  +2 pedal and radial pulses bilaterally.  NEUROLOGIC: The patient is alert, awake, and oriented x3 with no focal motor or sensory deficits appreciated bilaterally.  SKIN: Moist and warm with no rashes appreciated.  Psych: Not anxious, depressed LN: No inguinal LN enlargement    Antibiotics   Anti-infectives    Start     Dose/Rate Route Frequency Ordered Stop   03/04/15 1000  levofloxacin (LEVAQUIN) tablet 250 mg     250 mg Oral Daily 03/03/15 1917     03/03/15 1915  levofloxacin (LEVAQUIN) tablet 500 mg     500 mg Oral  Once 03/03/15 1903 03/03/15 2012      Medications   Scheduled Meds: . carvedilol  6.25 mg Oral BID WC  . famotidine  20 mg Oral Daily  . furosemide  80 mg Oral BID  . levofloxacin  250 mg Oral Daily  . loratadine  10 mg Oral Daily  . methylPREDNISolone (SOLU-MEDROL) injection  40 mg Intravenous Q8H  . mometasone-formoterol  2 puff Inhalation BID  . pantoprazole  40 mg Oral Daily  . pyridOXINE  100 mg Oral Daily  . Rivaroxaban  15 mg Oral QHS  . tiotropium  18 mcg Inhalation Daily  . vitamin B-12  1,000 mcg Oral Daily   Continuous Infusions:  PRN  Meds:.acetaminophen **OR** acetaminophen, albuterol, benzonatate, HYDROcodone-acetaminophen, ondansetron **OR** ondansetron (ZOFRAN) IV, senna-docusate   Data Review:   Micro Results Recent Results (from the past 240 hour(s))  MRSA PCR Screening     Status: None   Collection Time: 03/04/15  8:30 AM  Result Value Ref Range Status   MRSA by PCR NEGATIVE NEGATIVE Final    Comment:        The GeneXpert MRSA Assay (FDA approved for NASAL specimens only), is one component of a comprehensive MRSA colonization surveillance program. It is not intended to diagnose MRSA infection nor to guide or monitor treatment for MRSA infections.     Radiology Reports Dg Chest 2 View  03/03/2015   CLINICAL DATA:  Pneumonia and shortness of breath. Former smoker. History of breast cancer.  EXAM: CHEST  2 VIEW  COMPARISON:  PA and lateral chest 02/16/2015 and 05/20/2014. CT chest and PA and lateral chest 03/15/2014.  FINDINGS: The lungs are emphysematous. The patient is status post bilateral mastectomy. Surgical clips left axilla are noted. The lungs are clear. Heart size is normal. No pneumothorax or pleural effusion. Remote lower thoracic spine compression fractures are unchanged.  IMPRESSION: Emphysema without acute disease.   Electronically Signed   By: Inge Rise M.D.   On: 03/03/2015 15:24   Dg Chest 2 View  02/16/2015   CLINICAL DATA:  81 year old female with pain radiating under the left breast. Intermittent episodes. Initial encounter.  EXAM: CHEST  2 VIEW  COMPARISON:  05/20/2014 and earlier.  FINDINGS: Stable postoperative changes to the left axilla and chest wall. Chronic calcified atherosclerosis of the aorta and subclavian/ axillary artery. Chronic confluent biapical pulmonary scarring greater on the left. Stable cardiomegaly. Other mediastinal contours are stable. Stable large lung volumes. No pneumothorax, pulmonary edema or acute pulmonary opacity. No definite pleural effusion. Osteopenia.  No acute osseous abnormality identified.  IMPRESSION: Chronic findings.  No acute cardiopulmonary abnormality.   Electronically Signed  By: Genevie Ann M.D.   On: 02/16/2015 12:01     CBC  Recent Labs Lab 03/03/15 1501  WBC 11.5*  HGB 11.7*  HCT 34.7*  PLT 185  MCV 93.8  MCH 31.5  MCHC 33.6  RDW 14.0    Chemistries   Recent Labs Lab 03/03/15 1501  NA 133*  K 3.4*  CL 87*  CO2 33*  GLUCOSE 159*  BUN 18  CREATININE 1.17*  CALCIUM 8.7*  AST 22  ALT 13*  ALKPHOS 61  BILITOT 1.1   ------------------------------------------------------------------------------------------------------------------ estimated creatinine clearance is 29.9 mL/min (by C-G formula based on Cr of 1.17). ------------------------------------------------------------------------------------------------------------------ No results for input(s): HGBA1C in the last 72 hours. ------------------------------------------------------------------------------------------------------------------ No results for input(s): CHOL, HDL, LDLCALC, TRIG, CHOLHDL, LDLDIRECT in the last 72 hours. ------------------------------------------------------------------------------------------------------------------ No results for input(s): TSH, T4TOTAL, T3FREE, THYROIDAB in the last 72 hours.  Invalid input(s): FREET3 ------------------------------------------------------------------------------------------------------------------ No results for input(s): VITAMINB12, FOLATE, FERRITIN, TIBC, IRON, RETICCTPCT in the last 72 hours.  Coagulation profile No results for input(s): INR, PROTIME in the last 168 hours.  No results for input(s): DDIMER in the last 72 hours.  Cardiac Enzymes  Recent Labs Lab 03/03/15 1501  TROPONINI 0.05*   ------------------------------------------------------------------------------------------------------------------ Invalid input(s): POCBNP    Assessment & Plan   81 year old Mrs. Ellis  with past medical history of chronic respiratory failure secondary to COPD on home oxygen, history of cardiac myopathy, esophageal stricture status post", DJD, comes to the emergency room with increasing shortness of breath for last several days worsened today with productive phlegm. She was found to be hypoxic sats in the upper 80s. She is being admitted with  #1 acute on chronic hypoxic respiratory failure secondary to COPD exacerbation with acute bronchitis Change to by mouth prednisone Nebulizer when necessary, inhalers Empiric by mouth Levaquin Tessalon Perles when necessary  #2 history of dysphagia with history of a esophageal stricture in the past. Worsening symptoms GI evaluated the patient recommends a barium swallow  #3 history of chronic A. fib on Xarelto  #4 peripheral vascular disease follows with vascular as outpatient  #5 DVT prophylaxis on Xarelto  #6 generalized weakness deconditioning PT evaluate     Code Status Orders        Start     Ordered   03/03/15 2006  Full code   Continuous     03/03/15 2005           Consults  GI   DVT Prophylaxis  Xarelto  Lab Results  Component Value Date   PLT 185 03/03/2015     Time Spent in minutes   35 minutes   Dustin Flock M.D on 03/05/2015 at 11:17 AM  Between 7am to 6pm - Pager - [phone]  After 6pm go to [email or web address] - password [email or web address] [place] [facility]   Office  [phone]

## 2015-03-06 ENCOUNTER — Inpatient Hospital Stay: Payer: Medicare Other

## 2015-03-06 DIAGNOSIS — J441 Chronic obstructive pulmonary disease with (acute) exacerbation: Secondary | ICD-10-CM | POA: Diagnosis not present

## 2015-03-06 DIAGNOSIS — R10816 Epigastric abdominal tenderness: Secondary | ICD-10-CM | POA: Diagnosis not present

## 2015-03-06 DIAGNOSIS — K228 Other specified diseases of esophagus: Secondary | ICD-10-CM | POA: Diagnosis not present

## 2015-03-06 DIAGNOSIS — R12 Heartburn: Secondary | ICD-10-CM | POA: Diagnosis not present

## 2015-03-06 DIAGNOSIS — J40 Bronchitis, not specified as acute or chronic: Secondary | ICD-10-CM | POA: Diagnosis not present

## 2015-03-06 DIAGNOSIS — I10 Essential (primary) hypertension: Secondary | ICD-10-CM | POA: Diagnosis not present

## 2015-03-06 DIAGNOSIS — R0902 Hypoxemia: Secondary | ICD-10-CM | POA: Diagnosis not present

## 2015-03-06 DIAGNOSIS — R1310 Dysphagia, unspecified: Secondary | ICD-10-CM | POA: Diagnosis not present

## 2015-03-06 DIAGNOSIS — R110 Nausea: Secondary | ICD-10-CM | POA: Diagnosis not present

## 2015-03-06 DIAGNOSIS — R0602 Shortness of breath: Secondary | ICD-10-CM | POA: Diagnosis not present

## 2015-03-06 LAB — CREATININE, SERUM
CREATININE: 1.26 mg/dL — AB (ref 0.44–1.00)
GFR, EST AFRICAN AMERICAN: 42 mL/min — AB (ref >60)
GFR, EST NON AFRICAN AMERICAN: 36 mL/min — AB (ref >60)

## 2015-03-06 LAB — TROPONIN I
Troponin I: <0.03 ng/mL (ref <0.031)
Troponin I: <0.03 ng/mL (ref <0.031)

## 2015-03-06 MED ORDER — PREDNISONE 50 MG PO TABS
50.0000 mg | ORAL_TABLET | Freq: Every day | ORAL | Status: DC
  Administered 2015-03-07: 50 mg via ORAL
  Filled 2015-03-06: qty 1

## 2015-03-06 MED ORDER — CETYLPYRIDINIUM CHLORIDE 0.05 % MT LIQD
7.0000 mL | Freq: Two times a day (BID) | OROMUCOSAL | Status: DC
  Administered 2015-03-06 – 2015-03-07 (×2): 7 mL via OROMUCOSAL

## 2015-03-06 NOTE — Care Management Note (Addendum)
Case Management Note  Patient Details  Name: [name] [name] MRN: [id number] Date of Birth: 1922/07/14  Subjective/Objective:  81yo Ms [name] was admitted 03/03/15 with shortness of breath per COPD exacerbation with acute bronchitis. Widowed and lives alone. PCP=Dr Larene Beach. Sees Dr Allen Norris in [facility] for her hx of dysphagis/esophageal stricture. Pharmacy=[facility] in [place]. Transportation to appointments is provided by her daughter-in-law, Camrie Stock, or her friend Janeice Robinson. On 2 1/2 L N/C at home supplied by [facility]. Has used [facility] in the past and requests [facility] again if home health is ordered for her after this hospitalization. Home assistive equipment is a cane. On Xaralto at home for chronic A-Fib. PT evaluated [name] on 03/04/15 and recommended no follow-up.  Case Management will follow for discharge planning.                  Action/Plan:   Expected Discharge Date:                  Expected Discharge Plan:     In-House Referral:     Discharge planning Services     Post Acute Care Choice:    Choice offered to:     DME Arranged:    DME Agency:     HH Arranged:    [place] Agency:     Status of Service:     Medicare Important Message Given:  Yes-second notification given Date Medicare IM Given:    Medicare IM give by:    Date Additional Medicare IM Given:    Additional Medicare Important Message give by:     If discussed at [place] of Stay Meetings, dates discussed:    Additional Comments:  Rockett,Marilyn A, RN 03/06/2015, 11:39 AM

## 2015-03-06 NOTE — Progress Notes (Signed)
[place] at [facility]                                                                                                                                                                                            Patient Demographics   [name] [name], is a 81 y.o. female, DOB - [birth year], KGM:[id number]  Admit date - 03/03/2015   Admitting Physician Fritzi Mandes, MD  Outpatient Primary MD for the patient is [name], MD   LOS - 3  Subjective: Continues to complain of feeling weak, intermittent chest pain, intermittent dysphagia   Review of Systems:   CONSTITUTIONAL: No documented fever. No fatigue, weakness. No weight gain, no weight loss.  EYES: No blurry or double vision.  ENT: No tinnitus. No postnasal drip. No redness of the oropharynx.  RESPIRATORY: No cough, no wheeze, no hemoptysis. Positive dyspnea.  CARDIOVASCULAR: Intermittent chest pain. No orthopnea. No palpitations. No syncope.  GASTROINTESTINAL: No nausea, no vomiting or diarrhea. No abdominal pain. No melena or hematochezia. Dysphagia GENITOURINARY: No dysuria or hematuria.  ENDOCRINE: No polyuria or nocturia. No heat or cold intolerance.  HEMATOLOGY: No anemia. No bruising. No bleeding.  INTEGUMENTARY: No rashes. No lesions.  MUSCULOSKELETAL: No arthritis. No swelling. No gout.  NEUROLOGIC: No numbness, tingling, or ataxia. No seizure-type activity.  PSYCHIATRIC: No anxiety. No insomnia. No ADD.    Vitals:   Filed Vitals:   03/05/15 2029 03/06/15 0334 03/06/15 0947 03/06/15 0950  BP: 113/63 114/51 134/54   Pulse: 63 60 51 70  Temp: 97.5 F (36.4 C) 98 F (36.7 C)    TempSrc: Oral Oral    Resp: 18 16    Height:      Weight:      SpO2: 95% 97%  97%    Wt Readings from Last 3 Encounters:  03/03/15 67.132 kg (148 lb)  02/16/15 68.947 kg (152 lb)  02/07/15 71.668 kg (158 lb)     Intake/Output Summary (Last 24 hours) at 03/06/15 1102 Last data filed at 03/06/15  0022  Gross per 24 hour  Intake    360 ml  Output      0 ml  Net    360 ml    Physical Exam:   GENERAL: Pleasant-appearing in no apparent distress.  HEAD, EYES, EARS, NOSE AND THROAT: Atraumatic, normocephalic. Extraocular muscles are intact. Pupils equal and reactive to light. Sclerae anicteric. No conjunctival injection. No oro-pharyngeal erythema.  NECK: Supple. There is no jugular venous distention. No bruits, no lymphadenopathy, no thyromegaly.  HEART: Regular rate and rhythm,. No murmurs, no rubs, no clicks.  LUNGS:  Clear to auscultation bilaterally. No rales or rhonchi. No wheezes.  ABDOMEN: Soft, flat, nontender, nondistended. Has good bowel sounds. No hepatosplenomegaly appreciated.  EXTREMITIES: No evidence of any cyanosis, clubbing, or peripheral edema.  +2 pedal and radial pulses bilaterally.  NEUROLOGIC: The patient is alert, awake, and oriented x3 with no focal motor or sensory deficits appreciated bilaterally.  SKIN: Moist and warm with no rashes appreciated.  Psych: Not anxious, depressed LN: No inguinal LN enlargement    Antibiotics   Anti-infectives    Start     Dose/Rate Route Frequency Ordered Stop   03/04/15 1000  levofloxacin (LEVAQUIN) tablet 250 mg     250 mg Oral Daily 03/03/15 1917     03/03/15 1915  levofloxacin (LEVAQUIN) tablet 500 mg     500 mg Oral  Once 03/03/15 1903 03/03/15 2012      Medications   Scheduled Meds: . antiseptic oral rinse  7 mL Mouth Rinse BID  . carvedilol  6.25 mg Oral BID WC  . famotidine  20 mg Oral Daily  . furosemide  80 mg Oral BID  . guaiFENesin  600 mg Oral BID  . levofloxacin  250 mg Oral Daily  . loratadine  10 mg Oral Daily  . methylPREDNISolone (SOLU-MEDROL) injection  40 mg Intravenous Q8H  . mometasone-formoterol  2 puff Inhalation BID  . pantoprazole  40 mg Oral Daily  . pyridOXINE  100 mg Oral Daily  . Rivaroxaban  15 mg Oral QHS  . tiotropium  18 mcg Inhalation Daily  . vitamin B-12  1,000 mcg Oral  Daily   Continuous Infusions:  PRN Meds:.acetaminophen **OR** acetaminophen, albuterol, benzonatate, HYDROcodone-acetaminophen, ondansetron **OR** ondansetron (ZOFRAN) IV, senna-docusate, zolpidem   Data Review:   Micro Results Recent Results (from the past 240 hour(s))  MRSA PCR Screening     Status: None   Collection Time: 03/04/15  8:30 AM  Result Value Ref Range Status   MRSA by PCR NEGATIVE NEGATIVE Final    Comment:        The GeneXpert MRSA Assay (FDA approved for NASAL specimens only), is one component of a comprehensive MRSA colonization surveillance program. It is not intended to diagnose MRSA infection nor to guide or monitor treatment for MRSA infections.     Radiology Reports Dg Chest 2 View  03/03/2015   CLINICAL DATA:  Pneumonia and shortness of breath. Former smoker. History of breast cancer.  EXAM: CHEST  2 VIEW  COMPARISON:  PA and lateral chest 02/16/2015 and 05/20/2014. CT chest and PA and lateral chest 03/15/2014.  FINDINGS: The lungs are emphysematous. The patient is status post bilateral mastectomy. Surgical clips left axilla are noted. The lungs are clear. Heart size is normal. No pneumothorax or pleural effusion. Remote lower thoracic spine compression fractures are unchanged.  IMPRESSION: Emphysema without acute disease.   Electronically Signed   By: Inge Rise M.D.   On: 03/03/2015 15:24   Dg Chest 2 View  02/16/2015   CLINICAL DATA:  81 year old female with pain radiating under the left breast. Intermittent episodes. Initial encounter.  EXAM: CHEST  2 VIEW  COMPARISON:  05/20/2014 and earlier.  FINDINGS: Stable postoperative changes to the left axilla and chest wall. Chronic calcified atherosclerosis of the aorta and subclavian/ axillary artery. Chronic confluent biapical pulmonary scarring greater on the left. Stable cardiomegaly. Other mediastinal contours are stable. Stable large lung volumes. No pneumothorax, pulmonary edema or acute pulmonary  opacity. No definite pleural effusion. Osteopenia. No acute osseous abnormality identified.  IMPRESSION: Chronic findings.  No acute cardiopulmonary abnormality.   Electronically Signed   By: Genevie Ann M.D.   On: 02/16/2015 12:01     CBC  Recent Labs Lab 03/03/15 1501  WBC 11.5*  HGB 11.7*  HCT 34.7*  PLT 185  MCV 93.8  MCH 31.5  MCHC 33.6  RDW 14.0    Chemistries   Recent Labs Lab 03/03/15 1501  NA 133*  K 3.4*  CL 87*  CO2 33*  GLUCOSE 159*  BUN 18  CREATININE 1.17*  CALCIUM 8.7*  AST 22  ALT 13*  ALKPHOS 61  BILITOT 1.1   ------------------------------------------------------------------------------------------------------------------ estimated creatinine clearance is 29.9 mL/min (by C-G formula based on Cr of 1.17). ------------------------------------------------------------------------------------------------------------------ No results for input(s): HGBA1C in the last 72 hours. ------------------------------------------------------------------------------------------------------------------ No results for input(s): CHOL, HDL, LDLCALC, TRIG, CHOLHDL, LDLDIRECT in the last 72 hours. ------------------------------------------------------------------------------------------------------------------ No results for input(s): TSH, T4TOTAL, T3FREE, THYROIDAB in the last 72 hours.  Invalid input(s): FREET3 ------------------------------------------------------------------------------------------------------------------ No results for input(s): VITAMINB12, FOLATE, FERRITIN, TIBC, IRON, RETICCTPCT in the last 72 hours.  Coagulation profile No results for input(s): INR, PROTIME in the last 168 hours.  No results for input(s): DDIMER in the last 72 hours.  Cardiac Enzymes  Recent Labs Lab 03/03/15 1501  TROPONINI 0.05*   ------------------------------------------------------------------------------------------------------------------ Invalid input(s):  POCBNP    Assessment & Plan   81 year old Mrs. Schroeter with past medical history of chronic respiratory failure secondary to COPD on home oxygen, history of cardiac myopathy, esophageal stricture status post", DJD, comes to the emergency room with increasing shortness of breath for last several days worsened today with productive phlegm. She was found to be hypoxic sats in the upper 80s. She is being admitted with  #1 acute on chronic hypoxic respiratory failure secondary to COPD exacerbation with acute bronchitis Slow to improve. Continue current therapy out of bed to chair   #2 history of dysphagia with history of a esophageal stricture in the past. Barium swallow  #3 history of chronic A. fib on Xarelto  #4 peripheral vascular disease follows with vascular as outpatient  #5 DVT prophylaxis on Xarelto  #6 generalized weakness deconditioning PT evaluated patient and they recommended home  #7 chest pain check enzymes     Code Status Orders        Start     Ordered   03/03/15 2006  Full code   Continuous     03/03/15 2005           Consults  GI   DVT Prophylaxis  Xarelto  Lab Results  Component Value Date   PLT 185 03/03/2015     Time Spent in minutes   25 minutes   Dustin Flock M.D on 03/06/2015 at 11:02 AM  Between 7am to 6pm - Pager - [phone]  After 6pm go to [email or web address] - password [email or web address] [place] [facility] [facility]   Office  [phone]

## 2015-03-06 NOTE — Care Management Important Message (Signed)
Important Message  Patient Details  Name: ALIENA GHRIST MRN: [id number] Date of Birth: 1923-05-29   Medicare Important Message Given:  Yes-second notification given    Darius Bump Allmond 03/06/2015, 9:33 AM

## 2015-03-07 ENCOUNTER — Ambulatory Visit: Payer: Medicare Other | Admitting: Cardiovascular Disease

## 2015-03-07 ENCOUNTER — Inpatient Hospital Stay: Payer: Medicare Other

## 2015-03-07 ENCOUNTER — Encounter: Payer: Self-pay | Admitting: Radiology

## 2015-03-07 DIAGNOSIS — R0602 Shortness of breath: Secondary | ICD-10-CM | POA: Diagnosis not present

## 2015-03-07 DIAGNOSIS — K228 Other specified diseases of esophagus: Secondary | ICD-10-CM | POA: Diagnosis not present

## 2015-03-07 DIAGNOSIS — R0902 Hypoxemia: Secondary | ICD-10-CM | POA: Diagnosis not present

## 2015-03-07 DIAGNOSIS — J40 Bronchitis, not specified as acute or chronic: Secondary | ICD-10-CM | POA: Diagnosis not present

## 2015-03-07 DIAGNOSIS — J441 Chronic obstructive pulmonary disease with (acute) exacerbation: Secondary | ICD-10-CM | POA: Diagnosis not present

## 2015-03-07 DIAGNOSIS — I10 Essential (primary) hypertension: Secondary | ICD-10-CM | POA: Diagnosis not present

## 2015-03-07 LAB — CBC
HEMATOCRIT: 38.4 % (ref 35.0–47.0)
HEMOGLOBIN: 12.5 g/dL (ref 12.0–16.0)
MCH: 30.7 pg (ref 26.0–34.0)
MCHC: 32.6 g/dL (ref 32.0–36.0)
MCV: 94.2 fL (ref 80.0–100.0)
Platelets: 237 10*3/uL (ref 150–440)
RBC: 4.08 MIL/uL (ref 3.80–5.20)
RDW: 13.8 % (ref 11.5–14.5)
WBC: 9.9 10*3/uL (ref 3.6–11.0)

## 2015-03-07 MED ORDER — PREDNISONE 20 MG PO TABS
40.0000 mg | ORAL_TABLET | Freq: Every day | ORAL | Status: AC
Start: 2015-03-08 — End: 2015-03-08
  Administered 2015-03-08: 40 mg via ORAL
  Filled 2015-03-07: qty 2

## 2015-03-07 MED ORDER — PREDNISONE 20 MG PO TABS: 20.0000 mg | ORAL_TABLET | Freq: Every day | ORAL | Status: DC

## 2015-03-07 MED ORDER — PREDNISONE 20 MG PO TABS: 30.0000 mg | ORAL_TABLET | Freq: Every day | ORAL | Status: DC

## 2015-03-07 MED ORDER — IOHEXOL 350 MG/ML SOLN
75.0000 mL | Freq: Once | INTRAVENOUS | Status: AC | PRN
  Administered 2015-03-07: 75 mL via INTRAVENOUS

## 2015-03-07 MED ORDER — PREDNISONE 10 MG PO TABS: 10.0000 mg | ORAL_TABLET | Freq: Every day | ORAL | Status: DC

## 2015-03-07 NOTE — Progress Notes (Signed)
Patient refused bed alarm. Educated patient on falls.

## 2015-03-07 NOTE — Progress Notes (Signed)
[name] [name]                                                                                                                                                                                            Patient Demographics   [name] [name], is a 81 y.o. female, DOB - Oct 17, 1922, IRS:[id number]  Admit date - 03/03/2015   Admitting Physician Fritzi Mandes, MD  Outpatient Primary MD for the patient is [name], MD   LOS - 4  Subjective: Patient continues to complain of feeling weak and tired some dizziness persistent dysphagia   Review of Systems:   CONSTITUTIONAL: No documented fever. No fatigue, weakness. No weight gain, no weight loss.  EYES: No blurry or double vision.  ENT: No tinnitus. No postnasal drip. No redness of the oropharynx.  RESPIRATORY: No cough, no wheeze, no hemoptysis. Positive dyspnea.  CARDIOVASCULAR: Intermittent chest pain. No orthopnea. No palpitations. No syncope.  GASTROINTESTINAL: No nausea, no vomiting or diarrhea. No abdominal pain. No melena or hematochezia. Dysphagia GENITOURINARY: No dysuria or hematuria.  ENDOCRINE: No polyuria or nocturia. No heat or cold intolerance.  HEMATOLOGY: No anemia. No bruising. No bleeding.  INTEGUMENTARY: No rashes. No lesions.  MUSCULOSKELETAL: No arthritis. No swelling. No gout.  NEUROLOGIC: No numbness, tingling, or ataxia. No seizure-type activity.  PSYCHIATRIC: No anxiety. No insomnia. No ADD.    Vitals:   Filed Vitals:   03/06/15 1215 03/06/15 2022 03/07/15 0423 03/07/15 0800  BP: 112/50 134/66 113/44 114/50  Pulse: 67 55 53   Temp: 98 F (36.7 C)  97.5 F (36.4 C)   TempSrc: Oral  Oral   Resp: 16 18 20    Height:      Weight:      SpO2: 99% 100% 100%     Wt Readings from Last 3 Encounters:  03/03/15 67.132 kg (148 lb)  02/16/15 68.947 kg (152 lb)  02/07/15 71.668 kg (158 lb)     Intake/Output Summary (Last 24 hours) at 03/07/15 1000 Last data filed at  03/07/15 0800  Gross per 24 hour  Intake    360 ml  Output   2800 ml  Net  -2440 ml    Physical Exam:   GENERAL: Pleasant-appearing in no apparent distress.  HEAD, EYES, EARS, NOSE AND THROAT: Atraumatic, normocephalic. Extraocular muscles are intact. Pupils equal and reactive to light. Sclerae anicteric. No conjunctival injection. No oro-pharyngeal erythema.  NECK: Supple. There is no jugular venous distention. No bruits, no lymphadenopathy, no thyromegaly.  HEART: Regular rate and rhythm,. No murmurs, no rubs, no clicks.  LUNGS: Clear to auscultation  bilaterally. No rales or rhonchi. No wheezes.  ABDOMEN: Soft, flat, nontender, nondistended. Has good bowel sounds. No hepatosplenomegaly appreciated.  EXTREMITIES: No evidence of any cyanosis, clubbing, or peripheral edema.  +2 pedal and radial pulses bilaterally.  NEUROLOGIC: The patient is alert, awake, and oriented x3 with no focal motor or sensory deficits appreciated bilaterally.  SKIN: Moist and warm with no rashes appreciated.  Psych: Not anxious, depressed LN: No inguinal LN enlargement    Antibiotics   Anti-infectives    Start     Dose/Rate Route Frequency Ordered Stop   03/04/15 1000  levofloxacin (LEVAQUIN) tablet 250 mg     250 mg Oral Daily 03/03/15 1917     03/03/15 1915  levofloxacin (LEVAQUIN) tablet 500 mg     500 mg Oral  Once 03/03/15 1903 03/03/15 2012      Medications   Scheduled Meds: . antiseptic oral rinse  7 mL Mouth Rinse BID  . carvedilol  6.25 mg Oral BID WC  . famotidine  20 mg Oral Daily  . furosemide  80 mg Oral BID  . guaiFENesin  600 mg Oral BID  . levofloxacin  250 mg Oral Daily  . loratadine  10 mg Oral Daily  . mometasone-formoterol  2 puff Inhalation BID  . pantoprazole  40 mg Oral Daily  . predniSONE  50 mg Oral Q breakfast  . pyridOXINE  100 mg Oral Daily  . Rivaroxaban  15 mg Oral QHS  . tiotropium  18 mcg Inhalation Daily  . vitamin B-12  1,000 mcg Oral Daily   Continuous  Infusions:  PRN Meds:.acetaminophen **OR** acetaminophen, albuterol, benzonatate, HYDROcodone-acetaminophen, ondansetron **OR** ondansetron (ZOFRAN) IV, senna-docusate, zolpidem   Data Review:   Micro Results Recent Results (from the past 240 hour(s))  MRSA PCR Screening     Status: None   Collection Time: 03/04/15  8:30 AM  Result Value Ref Range Status   MRSA by PCR NEGATIVE NEGATIVE Final    Comment:        The GeneXpert MRSA Assay (FDA approved for NASAL specimens only), is one component of a comprehensive MRSA colonization surveillance program. It is not intended to diagnose MRSA infection nor to guide or monitor treatment for MRSA infections.     Radiology Reports Dg Chest 2 View  03/07/2015   CLINICAL DATA:  81 year old female with increasing shortness breath. Subsequent encounter.  EXAM: CHEST  2 VIEW  COMPARISON:  03/03/2015 and 03/08/2014.  FINDINGS: Majority of chronic lung changes including biapical pleural thickening and superior displacement of left hilar structures are unchanged.  When compared to the most recent examination, left costophrenic angle appears slightly different which may represent presence of tiny amount of pleural fluid or atelectasis.  No segmental consolidation or pneumothorax.  Central pulmonary vascular prominence without frank pulmonary edema.  Vascular calcifications.  Heart size top-normal.  IMPRESSION: When compared to the most recent examination, left costophrenic angle appears slightly different which may represent presence of tiny amount of pleural fluid or atelectasis. Otherwise chronic lung changes are stable.   Electronically Signed   By: Genia Del M.D.   On: 03/07/2015 08:03   Dg Chest 2 View  03/03/2015   CLINICAL DATA:  Pneumonia and shortness of breath. Former smoker. History of breast cancer.  EXAM: CHEST  2 VIEW  COMPARISON:  PA and lateral chest 02/16/2015 and 05/20/2014. CT chest and PA and lateral chest 03/15/2014.  FINDINGS: The  lungs are emphysematous. The patient is status post bilateral mastectomy. Surgical  clips left axilla are noted. The lungs are clear. Heart size is normal. No pneumothorax or pleural effusion. Remote lower thoracic spine compression fractures are unchanged.  IMPRESSION: Emphysema without acute disease.   Electronically Signed   By: Inge Rise M.D.   On: 03/03/2015 15:24   Dg Chest 2 View  02/16/2015   CLINICAL DATA:  81 year old female with pain radiating under the left breast. Intermittent episodes. Initial encounter.  EXAM: CHEST  2 VIEW  COMPARISON:  05/20/2014 and earlier.  FINDINGS: Stable postoperative changes to the left axilla and chest wall. Chronic calcified atherosclerosis of the aorta and subclavian/ axillary artery. Chronic confluent biapical pulmonary scarring greater on the left. Stable cardiomegaly. Other mediastinal contours are stable. Stable large lung volumes. No pneumothorax, pulmonary edema or acute pulmonary opacity. No definite pleural effusion. Osteopenia. No acute osseous abnormality identified.  IMPRESSION: Chronic findings.  No acute cardiopulmonary abnormality.   Electronically Signed   By: Genevie Ann M.D.   On: 02/16/2015 12:01   Dg Esophagus  03/06/2015   CLINICAL DATA:  81 year old female with dysphagia. Subsequent encounter.  EXAM: ESOPHOGRAM/BARIUM SWALLOW  TECHNIQUE: Single contrast examination was performed using  thin barium.  FLUOROSCOPY TIME:  Radiation Exposure Index (as provided by the fluoroscopic device): 1205.22 micro Gy cm2  COMPARISON:  10/26/2012.  FINDINGS: No laryngeal penetration or aspiration.  Prominent tertiary wave contractions noted throughout the examination (most prominent upper to mid thoracic esophagus). This limits evaluation for detection of subtle mucosal abnormality.  Moderate smooth narrowing distal esophagus just above a small sliding-type hilar hernia. A 13 mm barium tablet becomes lodged at this level and does not clear until partially  dissolved.  No reflux demonstrated.  IMPRESSION: Prominent tertiary wave contractions (presbyesophagus) noted throughout the examination limits evaluation for detection of subtle mucosal abnormality.  Moderate smooth narrowing distal esophagus just above a small sliding-type hilar hernia. A 13 mm barium tablet becomes lodged at this level and does not clear until partially dissolved.   Electronically Signed   By: Genia Del M.D.   On: 03/06/2015 13:01     CBC  Recent Labs Lab 03/03/15 1501  WBC 11.5*  HGB 11.7*  HCT 34.7*  PLT 185  MCV 93.8  MCH 31.5  MCHC 33.6  RDW 14.0    Chemistries   Recent Labs Lab 03/03/15 1501 03/06/15 1153  NA 133*  --   K 3.4*  --   CL 87*  --   CO2 33*  --   GLUCOSE 159*  --   BUN 18  --   CREATININE 1.17* 1.26*  CALCIUM 8.7*  --   AST 22  --   ALT 13*  --   ALKPHOS 61  --   BILITOT 1.1  --    ------------------------------------------------------------------------------------------------------------------ estimated creatinine clearance is 27.8 mL/min (by C-G formula based on Cr of 1.26). ------------------------------------------------------------------------------------------------------------------ No results for input(s): HGBA1C in the last 72 hours. ------------------------------------------------------------------------------------------------------------------ No results for input(s): CHOL, HDL, LDLCALC, TRIG, CHOLHDL, LDLDIRECT in the last 72 hours. ------------------------------------------------------------------------------------------------------------------ No results for input(s): TSH, T4TOTAL, T3FREE, THYROIDAB in the last 72 hours.  Invalid input(s): FREET3 ------------------------------------------------------------------------------------------------------------------ No results for input(s): VITAMINB12, FOLATE, FERRITIN, TIBC, IRON, RETICCTPCT in the last 72 hours.  Coagulation profile No results for input(s): INR,  PROTIME in the last 168 hours.  No results for input(s): DDIMER in the last 72 hours.  Cardiac Enzymes  Recent Labs Lab 03/06/15 1153 03/06/15 1505 03/06/15 1915  TROPONINI <0.03 <0.03 <0.03   ------------------------------------------------------------------------------------------------------------------ [name] [name]  input(s): POCBNP    Assessment & Plan   81 year old Mrs. Barg with past medical history of chronic respiratory failure secondary to COPD on home oxygen, history of cardiac myopathy, esophageal stricture status post", DJD, comes to the emergency room with increasing shortness of breath for last several days worsened today with productive phlegm. She was found to be hypoxic sats in the upper 80s. She is being admitted with  #1 acute on chronic hypoxic respiratory failure secondary to COPD exacerbation with acute bronchitis Check a CT per PE make sure that she has mild pitting underlying   #2 history of dysphagia with history of a esophageal stricture in the past. Barium swallow shows prebyeous esopagus, as well as proximal stricture in the lower esophagus. I have discussed the case with patient's primary GI doctor Dr. Allen Norris who states that once patient is discharged she will be happy to see her in the office and do an EGD. To contact [phone] [place]  #3 history of chronic A. fib on Xarelto which which will need to be held 2 days prior to procedure  #4 peripheral vascular disease follows with vascular as outpatient  #5 DVT prophylaxis on Xarelto  #6 generalized weakness deconditioning PT evaluated patient and they recommended home  #7 chest pain check enzymes     Code Status Orders        Start     Ordered   03/03/15 2006  Full code   Continuous     03/03/15 2005           Consults  GI   DVT Prophylaxis  Xarelto  Lab Results  Component Value Date   PLT 185 03/03/2015     Time Spent in minutes   25 minutes   Dustin Flock M.D on  03/07/2015 at 10:00 AM  Between 7am to 6pm - Pager - [phone]  After 6pm go to [email or web address] - password [email or web address] [facility]   Office  [phone]

## 2015-03-07 NOTE — Care Management (Signed)
Heads up referral to [facility] for nursing and physical therapy .

## 2015-03-08 ENCOUNTER — Other Ambulatory Visit: Payer: Self-pay

## 2015-03-08 DIAGNOSIS — I739 Peripheral vascular disease, unspecified: Secondary | ICD-10-CM | POA: Diagnosis not present

## 2015-03-08 DIAGNOSIS — J9621 Acute and chronic respiratory failure with hypoxia: Secondary | ICD-10-CM | POA: Diagnosis not present

## 2015-03-08 DIAGNOSIS — R531 Weakness: Secondary | ICD-10-CM | POA: Diagnosis not present

## 2015-03-08 DIAGNOSIS — J441 Chronic obstructive pulmonary disease with (acute) exacerbation: Secondary | ICD-10-CM | POA: Diagnosis not present

## 2015-03-08 MED ORDER — PREDNISONE 10 MG PO TABS: ORAL_TABLET | ORAL | Status: DC

## 2015-03-08 MED ORDER — LEVOFLOXACIN 250 MG PO TABS: 250.0000 mg | ORAL_TABLET | Freq: Every day | ORAL | Status: DC

## 2015-03-08 NOTE — Progress Notes (Signed)
ANTIBIOTIC CONSULT NOTE - FOLLOW UP  Pharmacy Consult for Levaquin Indication: pneumonia  Allergies  Allergen Reactions  . Penicillins Itching, Swelling, Rash and Other (See Comments)    Pt is unable to answer additional questions about this medication because it happened so long ago.  . Sulfa Antibiotics Other (See Comments)    Reaction:  Unknown     Patient Measurements: Height: 5\' 6"  (167.6 cm) Weight: 148 lb (67.132 kg) IBW/kg (Calculated) : 59.3  Vital Signs: Temp: 97.9 F (36.6 C) (09/28 0527) Temp Source: Oral (09/28 0527) BP: 128/60 mmHg (09/28 0853) Pulse Rate: 79 (09/28 0853)  Labs:  Recent Labs  03/06/15 1153 03/07/15 0952  WBC  --  9.9  HGB  --  12.5  PLT  --  237  CREATININE 1.26*  --    Estimated Creatinine Clearance: 27.8 mL/min (by C-G formula based on Cr of 1.26).  Microbiology: Recent Results (from the past 720 hour(s))  Urine Culture     Status: None   Collection Time: 02/16/15 12:00 AM  Result Value Ref Range Status   Urine Culture, Routine Final report  Final   Urine Culture result 1 Comment  Final    Comment: Mixed urogenital flora 10,000-25,000 colony forming units per mL   MRSA PCR Screening     Status: None   Collection Time: 03/04/15  8:30 AM  Result Value Ref Range Status   MRSA by PCR NEGATIVE NEGATIVE Final    Comment:        The GeneXpert MRSA Assay (FDA approved for NASAL specimens only), is one component of a comprehensive MRSA colonization surveillance program. It is not intended to diagnose MRSA infection nor to guide or monitor treatment for MRSA infections.     Medical History: Past Medical History  Diagnosis Date  . HTN (hypertension)   . COPD (chronic obstructive pulmonary disease)     a. on home O2 nightly.  . Diverticulosis   . Takotsubo cardiomyopathy 10/2012    a. 10/2012: presented w/ cardiogenic shock/systolic CHF/VDRF - EF 27% by cath, 40% by f/u echo - cath with mild nonobstructive CAD. b. Not  discharged on ACEI due to hypotension.  . Cardiogenic shock     a. 10/2012 - due to Takotsubo CM.  [name] Respiratory failure     a. On home O2 nightly. b. VDRF 10/2012 - due to CAP/pulm edema in setting of cardiogenic shock/Takotsubo CM.  [name] CAP (community acquired pneumonia)     a. 10/2012 - will need f/u CXR in June 2014 to ensure clearance.  . Esophageal stricture     a. s/p dilitation 10/2012.  [name] Pleural effusion, right     a. s/p thoracentesis 10/28/12.  . Atrial fibrillation     a. Dx 10/2012, rate controlled, started on Xarelto.  . Claudication     a. ABI 10/2012 - R 0.87, L 0.74.  [name] GERD (gastroesophageal reflux disease)   . CKD (chronic kidney disease), stage III   . Breast CA     hx  . Abdominal hernia     chronic  . CAD (coronary artery disease)     a. 10/2012 - presented w/ STEMI felt due to Takotsubo - mild nonobstructive dz by cath.  [name] STEMI (ST elevation myocardial infarction)   . Cellulitis   . CHF (congestive heart failure)     Medications:  Scheduled:  . antiseptic oral rinse  7 mL Mouth Rinse BID  . carvedilol  6.25 mg Oral BID WC  .  famotidine  20 mg Oral Daily  . furosemide  80 mg Oral BID  . guaiFENesin  600 mg Oral BID  . levofloxacin  250 mg Oral Daily  . loratadine  10 mg Oral Daily  . mometasone-formoterol  2 puff Inhalation BID  . pantoprazole  40 mg Oral Daily  . [START ON 03/09/2015] predniSONE  30 mg Oral Q breakfast   Followed by  . [START ON 03/10/2015] predniSONE  20 mg Oral Q breakfast   Followed by  . [START ON 03/11/2015] predniSONE  10 mg Oral Q breakfast  . pyridOXINE  100 mg Oral Daily  . Rivaroxaban  15 mg Oral QHS  . tiotropium  18 mcg Inhalation Daily  . vitamin B-12  1,000 mcg Oral Daily   Assessment: LS is a 81 yo female being treated for CAP (CXR negative but pt is symptomatic). Pharmacy consulted to dose levofloxacin. Patient was initiated on levofloxacin 500mg  x1 dose then 250mg  PO q24 hours based on CrCl. CrCl 27.8 ml/min today.  Today  is day 6 of Abx treatment.  Plan:  Will continue 250mg  dose based on CrCl. Pharmacy will continue to monitor for changes in renal function and adjust dose accordingly. Will recommend discontinuing Abx during rounds.   Nancy Fetter, PharmD Pharmacy Resident

## 2015-03-08 NOTE — Progress Notes (Signed)
A&O. Refused bed alarm and has been independent. O2 at 3L. No complaints. Afib on tele.

## 2015-03-08 NOTE — Discharge Instructions (Signed)
Acute Respiratory Failure °Respiratory failure is when your lungs are not working well and your breathing (respiratory) system fails. When respiratory failure occurs, it is difficult for your lungs to get enough oxygen, get rid of carbon dioxide, or both. Respiratory failure can be life threatening.  °Respiratory failure can be acute or chronic. Acute respiratory failure is sudden, severe, and requires emergency medical treatment. Chronic respiratory failure is less severe, happens over time, and requires ongoing treatment.  °WHAT ARE THE CAUSES OF ACUTE RESPIRATORY FAILURE?  °Any problem affecting the heart or lungs can cause acute respiratory failure. Some of these causes include the following: °· Chronic bronchitis and emphysema (COPD).   °· Blood clot going to a lung (pulmonary embolism).   °· Having water in the lungs caused by heart failure, lung injury, or infection (pulmonary edema).   °· Collapsed lung (pneumothorax).   °· Pneumonia.   °· Pulmonary fibrosis.   °· Obesity.   °· Asthma.   °· Heart failure.   °· Any type of trauma to the chest that can make breathing difficult.   °· Nerve or muscle diseases making chest movements difficult. °WHAT SYMPTOMS SHOULD YOU WATCH FOR?  °If you have any of these signs or symptoms, you should seek immediate medical care:  °· You have shortness of breath (dyspnea) with or without activity.   °· You have rapid, fast breathing (tachypnea).   °· You are wheezing. °· You are unable to say more than a few words without having to catch your breath. °· You find it very difficult to function normally. °· You have a fast heart rate.   °· You have a bluish color to your finger or toe nail beds.   °· You have confusion or drowsiness or both.   °HOW WILL MY ACUTE RESPIRATORY FAILURE BE TREATED?  °Treatment of acute respiratory failure depends on the cause of the respiratory failure. Usually, you will stay in the intensive care unit so your breathing can be watched closely. Treatment  can include the following: °· Oxygen. Oxygen can be delivered through the following: °¨ Nasal cannula. This is small tubing that goes in your nose to give you oxygen. °¨ Face mask. A face mask covers your nose and mouth to give you oxygen. °· Medicine. Different medicines can be given to help with breathing. These can include: °¨ Nebulizers. Nebulizers deliver medicines to open the air passages (bronchodilators). These medicines help to open or relax the airways in the lungs so you can breathe better. They can also help loosen mucus from your lungs. °¨ Diuretics. Diuretic medicines can help you breathe better by getting rid of extra water in your body. °¨ Steroids. Steroid medicines can help decrease swelling (inflammation) in your lungs. °¨ Antibiotics. °· Chest tube. If you have a collapsed lung (pneumothorax), a chest tube is placed to help reinflate the lung. °· Non-invasive positive pressure ventilation (NPPV). This is a tight-fitting mask that goes over your nose and mouth. The mask has tubing that is attached to a machine. The machine blows air into the tubing, which helps to keep the tiny air sacs (alveoli) in your lungs open. This machine allows you to breathe on your own. °· Ventilator. A ventilator is a breathing machine. When on a ventilator, a breathing tube is put into the lungs. A ventilator is used when you can no longer breathe well enough on your own. You may have low oxygen levels or high carbon dioxide (CO2) levels in your blood. When you are on a ventilator, sedation and pain medicines are given to make you sleep   so your lungs can heal. °Document Released: 06/01/2013 Document Revised: 10/11/2013 Document Reviewed: 06/01/2013 °[employer]® Patient Information ©2015 [employer]. This information is not intended to replace advice given to you by your health care provider. Make sure you discuss any questions you have with your health care provider. ° °

## 2015-03-08 NOTE — Discharge Summary (Signed)
[facility] NAME: [name] [name]    MR#:  [id number]  DATE OF BIRTH:  28-Sep-1922  DATE OF ADMISSION:  03/03/2015 ADMITTING PHYSICIAN: Fritzi Mandes, MD  DATE OF DISCHARGE: 03/08/2015  3:00 PM  PRIMARY CARE PHYSICIAN: [name], MD    ADMISSION DIAGNOSIS:  Elevated troponin [R79.89] COPD exacerbation [J44.1]  DISCHARGE DIAGNOSIS:  Active Problems:   Acute on chronic respiratory failure with hypoxemia   SECONDARY DIAGNOSIS:   Past Medical History  Diagnosis Date  . HTN (hypertension)   . COPD (chronic obstructive pulmonary disease)     a. on home O2 nightly.  . Diverticulosis   . Takotsubo cardiomyopathy 10/2012    a. 10/2012: presented w/ cardiogenic shock/systolic CHF/VDRF - EF 68% by cath, 40% by f/u echo - cath with mild nonobstructive CAD. b. Not discharged on ACEI due to hypotension.  . Cardiogenic shock     a. 10/2012 - due to Takotsubo CM.  [name] Respiratory failure     a. On home O2 nightly. b. VDRF 10/2012 - due to CAP/pulm edema in setting of cardiogenic shock/Takotsubo CM.  [name] CAP (community acquired pneumonia)     a. 10/2012 - will need f/u CXR in June 2014 to ensure clearance.  . Esophageal stricture     a. s/p dilitation 10/2012.  [name] Pleural effusion, right     a. s/p thoracentesis 10/28/12.  . Atrial fibrillation     a. Dx 10/2012, rate controlled, started on Xarelto.  . Claudication     a. ABI 10/2012 - R 0.87, L 0.74.  [name] GERD (gastroesophageal reflux disease)   . CKD (chronic kidney disease), stage III   . Breast CA     hx  . Abdominal hernia     chronic  . CAD (coronary artery disease)     a. 10/2012 - presented w/ STEMI felt due to Takotsubo - mild nonobstructive dz by cath.  [name] STEMI (ST elevation myocardial infarction)   . Cellulitis   . CHF (congestive heart failure)     HOSPITAL COURSE:   81 year old Mrs. Righi with past medical history of chronic respiratory failure secondary to COPD on home  oxygen, history of cardiac myopathy, esophageal stricture status post dilatation, DJD, comes to the emergency room with increasing shortness of breath for last several days worsened today with productive phlegm. She was found to be hypoxic sats in the upper 80s.  #1 acute on chronic respiratory failure with hypoxia-this was likely secondary to COPD exacerbation. -Patient was treated just aggressively with IV steroids, eventually weaned down to oral prednisone taper and also given around the clock nebulizer treatments and empiric antibiotics. -After aggressive therapy patient's clinical symptoms have improved. And at presently patient is being discharged home on oral prednisone taper along with empiric Levaquin. -She is already on oxygen at home and will continue using that at 2 L to 3 L continuously. -Patient will resume her Advair, Spiriva upon discharge.  #2 history of dysphagia with history of a esophageal stricture in the past -this was thought to be what seemed to the patient's pneumonia and respiratory distress. A gastroenterology consult was obtained on the hospital but given her tenuous respiratory status did not want to attempt any aggressive intervention while in the hospital. -Now that her respiratory status has improved she is being discharged home and will have close follow-up with her gastroenterologist Dr. Allen Norris for intervention of her esophageal stricture and dysphagia. She has been  tolerating a soft diet without any evidence of worsening respiratory distress while in the hospital.  #3 history of chronic A. Fib-patient remained rate controlled on her Coreg which she will continue. -She will continue Xarelto for long-term anticoagulation.  #4 peripheral vascular disease-continue Xarelto  #5 generalized weakness deconditioning PT evaluated patient and they recommended home with home health and she was discharged with the services.  #6 GERD-she will continue her ranitidine and  omeprazole.  Patient was discharged with home health nursing and physical therapy and aide services.  DISCHARGE CONDITIONS:   Stable  CONSULTS OBTAINED:  Treatment Team:  [name], MD  DRUG ALLERGIES:   Allergies  Allergen Reactions  . Penicillins Itching, Swelling, Rash and Other (See Comments)    Pt is unable to answer additional questions about this medication because it happened so long ago.  . Sulfa Antibiotics Other (See Comments)    Reaction:  Unknown     DISCHARGE MEDICATIONS:   Discharge Medication List as of 03/08/2015 11:55 AM    START taking these medications   Details  levofloxacin (LEVAQUIN) 250 MG tablet Take 1 tablet (250 mg total) by mouth daily., Starting 03/08/2015, Until Discontinued, Print    predniSONE (DELTASONE) 10 MG tablet Label  & dispense according to the schedule below. 4 Pills PO for 1 day, 3 Pills PO for 1 day, 2 Pills PO for 1 day, 1 Pill PO for 1 days then STOP., Print      CONTINUE these medications which have NOT CHANGED   Details  albuterol (PROVENTIL HFA;VENTOLIN HFA) 108 (90 BASE) MCG/ACT inhaler Inhale 2 puffs into the lungs every 4 (four) hours as needed for wheezing or shortness of breath., Until Discontinued, Historical Med    albuterol (PROVENTIL) (2.5 MG/3ML) 0.083% nebulizer solution Take 2.5 mg by nebulization 4 (four) times daily as needed for wheezing or shortness of breath., Until Discontinued, Historical Med    benzonatate (TESSALON) 100 MG capsule Take 1 capsule (100 mg total) by mouth 2 (two) times daily as needed for cough., Starting 03/01/2015, Until Discontinued, Normal    carvedilol (COREG) 6.25 MG tablet Take 6.25 mg by mouth 2 (two) times daily with a meal., Until Discontinued, Historical Med    Fluticasone-Salmeterol (ADVAIR) 250-50 MCG/DOSE AEPB Inhale 1 puff into the lungs 2 (two) times daily., Until Discontinued, Historical Med    furosemide (LASIX) 40 MG tablet Take 80 mg by mouth 2 (two) times daily.,  Until Discontinued, Historical Med    loratadine (CLARITIN) 10 MG tablet Take 10 mg by mouth daily., Until Discontinued, Historical Med    omeprazole (PRILOSEC) 20 MG capsule Take 1 capsule (20 mg total) by mouth daily., Starting 12/21/2014, Until Discontinued, Normal    pyridOXINE (VITAMIN B-6) 100 MG tablet Take 100 mg by mouth daily., Until Discontinued, Historical Med    ranitidine (ZANTAC) 150 MG tablet Take 150 mg by mouth at bedtime., Until Discontinued, Historical Med    Rivaroxaban (XARELTO) 15 MG TABS tablet Take 15 mg by mouth at bedtime., Until Discontinued, Historical Med    tiotropium (SPIRIVA) 18 MCG inhalation capsule Place 18 mcg into inhaler and inhale daily., Until Discontinued, Historical Med    vitamin B-12 (CYANOCOBALAMIN) 1000 MCG tablet Take 1,000 mcg by mouth daily., Until Discontinued, Historical Med      STOP taking these medications     gabapentin (NEURONTIN) 100 MG capsule          DISCHARGE INSTRUCTIONS:   DIET:  Cardiac diet and soft diet  DISCHARGE CONDITION:  Stable  ACTIVITY:  Activity as tolerated  OXYGEN:  Home Oxygen: Yes.     Oxygen Delivery: 2 liters/min via Patient connected to nasal cannula oxygen  DISCHARGE LOCATION:  Home with home health nursing, physical therapy, Aide   If you experience worsening of your admission symptoms, develop shortness of breath, life threatening emergency, suicidal or homicidal thoughts you must seek medical attention immediately by calling 911 or calling your MD immediately  if symptoms less severe.  You Must read complete instructions/literature along with all the possible adverse reactions/side effects for all the Medicines you take and that have been prescribed to you. Take any new Medicines after you have completely understood and accpet all the possible adverse reactions/side effects.   Please note  You were cared for by a hospitalist during your hospital stay. If you have any questions about  your discharge medications or the care you received while you were in the hospital after you are discharged, you can call the unit and asked to speak with the hospitalist on call if the hospitalist that took care of you is not available. Once you are discharged, your primary care physician will handle any further medical issues. Please note that NO REFILLS for any discharge medications will be authorized once you are discharged, as it is imperative that you return to your primary care physician (or establish a relationship with a primary care physician if you do not have one) for your aftercare needs so that they can reassess your need for medications and monitor your lab values.     Today   Patient sitting up on the side of the bed in no apparent distress. Family at bedside. Still feels a little bit dizzy and weak but improved since admission. Tolerating soft diet well  VITAL SIGNS:  Blood pressure 118/48, pulse 76, temperature 97.5 F (36.4 C), temperature source Oral, resp. rate 24, height 5\' 6"  (1.676 m), weight 67.132 kg (148 lb), SpO2 97 %.  I/O:   Intake/Output Summary (Last 24 hours) at 03/08/15 1621 Last data filed at 03/08/15 1300  Gross per 24 hour  Intake    720 ml  Output   1800 ml  Net  -1080 ml    PHYSICAL EXAMINATION:  GENERAL:  81 y.o.-year-old patient sitting up in the side of the bed in no acute distress.  EYES: Pupils equal, round, reactive to light and accommodation. No scleral icterus. Extraocular muscles intact.  HEENT: Head atraumatic, normocephalic. Oropharynx and nasopharynx clear.  NECK:  Supple, no jugular venous distention. No thyroid enlargement, no tenderness.  LUNGS: Normal breath sounds bilaterally, no wheezing, rales,rhonchi. No use of accessory muscles of respiration.  CARDIOVASCULAR: S1, S2 normal. No murmurs, rubs, or gallops.  ABDOMEN: Soft, non-tender, non-distended. Bowel sounds present. No organomegaly or mass.  EXTREMITIES: No pedal edema,  cyanosis, or clubbing.  NEUROLOGIC: Cranial nerves II through XII are intact. No focal motor or sensory defecits b/l.  PSYCHIATRIC: The patient is alert and oriented x 3. Good affect.  SKIN: No obvious rash, lesion, or ulcer.   DATA REVIEW:   CBC  Recent Labs Lab 03/07/15 0952  WBC 9.9  HGB 12.5  HCT 38.4  PLT 237    Chemistries   Recent Labs Lab 03/03/15 1501 03/06/15 1153  NA 133*  --   K 3.4*  --   CL 87*  --   CO2 33*  --   GLUCOSE 159*  --   BUN 18  --  CREATININE 1.17* 1.26*  CALCIUM 8.7*  --   AST 22  --   ALT 13*  --   ALKPHOS 61  --   BILITOT 1.1  --     Cardiac Enzymes  Recent Labs Lab 03/06/15 1915  TROPONINI <0.03    Microbiology Results  Results for orders placed or performed during the hospital encounter of 03/03/15  MRSA PCR Screening     Status: None   Collection Time: 03/04/15  8:30 AM  Result Value Ref Range Status   MRSA by PCR NEGATIVE NEGATIVE Final    Comment:        The GeneXpert MRSA Assay (FDA approved for NASAL specimens only), is one component of a comprehensive MRSA colonization surveillance program. It is not intended to diagnose MRSA infection nor to guide or monitor treatment for MRSA infections.     RADIOLOGY:  Dg Chest 2 View  03/07/2015   CLINICAL DATA:  81 year old female with increasing shortness breath. Subsequent encounter.  EXAM: CHEST  2 VIEW  COMPARISON:  03/03/2015 and 03/08/2014.  FINDINGS: Majority of chronic lung changes including biapical pleural thickening and superior displacement of left hilar structures are unchanged.  When compared to the most recent examination, left costophrenic angle appears slightly different which may represent presence of tiny amount of pleural fluid or atelectasis.  No segmental consolidation or pneumothorax.  Central pulmonary vascular prominence without frank pulmonary edema.  Vascular calcifications.  Heart size top-normal.  IMPRESSION: When compared to the most recent  examination, left costophrenic angle appears slightly different which may represent presence of tiny amount of pleural fluid or atelectasis. Otherwise chronic lung changes are stable.   Electronically Signed   By: Genia Del M.D.   On: 03/07/2015 08:03   Ct Angio Chest Pe W/cm &/or Wo Cm  03/07/2015   CLINICAL DATA:  81 year old female with shortness of breath. COPD exacerbation. Initial encounter.  EXAM: CT ANGIOGRAPHY CHEST WITH CONTRAST  TECHNIQUE: Multidetector CT imaging of the chest was performed using the standard protocol during bolus administration of intravenous contrast. Multiplanar CT image reconstructions and MIPs were obtained to evaluate the vascular anatomy.  CONTRAST:  56mL OMNIPAQUE IOHEXOL 350 MG/ML SOLN  COMPARISON:  Chest radiographs 0746 hours today. Chest CTA 03/15/2014.  FINDINGS: Good contrast bolus timing in the pulmonary arterial tree.  No focal filling defect identified in the pulmonary arterial tree to suggest the presence of acute pulmonary embolism.  Chronic combined centrilobular and subpleural emphysema and pulmonary hyperinflation. Chronic apical consolidation/scarring with bronchiectasis and air bronchograms. Regressed superior segment right lower lobe nodularity since the 2015 CTA, postinflammatory appearing. Chronic lingula and medial segment right middle lobe scarring is stable. Regressed and nearly resolved bilateral pleural effusions compared to the prior CTA. Mild medial lower lobe nodularity bilaterally today (series 7, image 71 and 72). Mild ground-glass opacity also in the posterior lateral segment of the right middle lobe (image 77). No consolidation. Synechiae in the trachea just above the carina. Major airways are patent with chronic perihilar peribronchial thickening.  No pericardial effusion. Stable cardiomegaly. Extensive soft and calcified atherosclerosis of the aorta re- demonstrated. Stable mediastinal lymph nodes. Extensive calcified coronary artery  atherosclerosis re- demonstrated.  Stable and negative visualized liver, spleen, adrenal glands, stomach, and left renal upper pole.  Advanced osteopenia. Flowing endplate osteophytes or syndesmophytes throughout the thoracic spine. Chronic L1 and L2 compression fractures appear stable. No acute osseous abnormality identified.  Review of the MIP images confirms the above findings.  IMPRESSION: 1.  No evidence of acute pulmonary embolus. 2. Chronic lung disease with hyperinflation, emphysema, peribronchial thickening, and apical architectural distortion. Mild distal airway infection in both lower lobes and the lateral segment right middle lobe today. Nearly resolved pleural effusions seen in 2015. 3. Advanced calcified aortic and coronary artery atherosclerosis.   Electronically Signed   By: Genevie Ann M.D.   On: 03/07/2015 10:55      Management plans discussed with the patient, family and they are in agreement.  CODE STATUS:     Code Status Orders        Start     Ordered   03/03/15 2006  Full code   Continuous     03/03/15 2005      TOTAL TIME TAKING CARE OF THIS PATIENT: 40 minutes.    Henreitta Leber M.D on 03/08/2015 at 4:21 PM  Between 7am to 6pm - Pager - [phone]  After 6pm go to [email or web address] - password [email or web address]  Office  [phone]  CC: Primary care physician; [name], MD

## 2015-03-08 NOTE — Care Management Important Message (Signed)
Important Message  Patient Details  Name: [name] [name] MRN: [id number] Date of Birth: 1923-05-31   Medicare Important Message Given:  Yes-third notification given    Juliann Pulse A Allmond 03/08/2015, 11:24 AM

## 2015-03-08 NOTE — Care Management (Signed)
Patient for discharge home today with orders for home health nursing, physical therapy and aide with [facility].  Patient in agreement. Notified [facility].   Orders and face to face present in Epic

## 2015-03-08 NOTE — Progress Notes (Signed)
Patient A&O, VSS.  No complaints of pain.  Ambulating in room; tolerating diet well.  Up to BR to void; reports BM this a.m.  Discharge instructions reviewed with patient.  All questions were answered and understanding was verbalized.  Patient discharged home via wheelchair in stable condition escorted by nursing staff.

## 2015-03-09 DIAGNOSIS — Z9981 Dependence on supplemental oxygen: Secondary | ICD-10-CM | POA: Diagnosis not present

## 2015-03-09 DIAGNOSIS — Z79891 Long term (current) use of opiate analgesic: Secondary | ICD-10-CM | POA: Diagnosis not present

## 2015-03-09 DIAGNOSIS — I4891 Unspecified atrial fibrillation: Secondary | ICD-10-CM | POA: Diagnosis not present

## 2015-03-09 DIAGNOSIS — J440 Chronic obstructive pulmonary disease with acute lower respiratory infection: Secondary | ICD-10-CM | POA: Diagnosis not present

## 2015-03-09 DIAGNOSIS — Z7901 Long term (current) use of anticoagulants: Secondary | ICD-10-CM | POA: Diagnosis not present

## 2015-03-09 DIAGNOSIS — J441 Chronic obstructive pulmonary disease with (acute) exacerbation: Secondary | ICD-10-CM | POA: Diagnosis not present

## 2015-03-09 DIAGNOSIS — I739 Peripheral vascular disease, unspecified: Secondary | ICD-10-CM | POA: Diagnosis not present

## 2015-03-10 DIAGNOSIS — I4891 Unspecified atrial fibrillation: Secondary | ICD-10-CM | POA: Diagnosis not present

## 2015-03-10 DIAGNOSIS — Z9981 Dependence on supplemental oxygen: Secondary | ICD-10-CM | POA: Diagnosis not present

## 2015-03-10 DIAGNOSIS — J441 Chronic obstructive pulmonary disease with (acute) exacerbation: Secondary | ICD-10-CM | POA: Diagnosis not present

## 2015-03-10 DIAGNOSIS — J440 Chronic obstructive pulmonary disease with acute lower respiratory infection: Secondary | ICD-10-CM | POA: Diagnosis not present

## 2015-03-10 DIAGNOSIS — Z7901 Long term (current) use of anticoagulants: Secondary | ICD-10-CM | POA: Diagnosis not present

## 2015-03-10 DIAGNOSIS — I739 Peripheral vascular disease, unspecified: Secondary | ICD-10-CM | POA: Diagnosis not present

## 2015-03-10 DIAGNOSIS — Z79891 Long term (current) use of opiate analgesic: Secondary | ICD-10-CM | POA: Diagnosis not present

## 2015-03-13 DIAGNOSIS — J449 Chronic obstructive pulmonary disease, unspecified: Secondary | ICD-10-CM | POA: Diagnosis not present

## 2015-03-13 DIAGNOSIS — M7989 Other specified soft tissue disorders: Secondary | ICD-10-CM | POA: Diagnosis not present

## 2015-03-13 DIAGNOSIS — I739 Peripheral vascular disease, unspecified: Secondary | ICD-10-CM | POA: Diagnosis not present

## 2015-03-13 DIAGNOSIS — I872 Venous insufficiency (chronic) (peripheral): Secondary | ICD-10-CM | POA: Diagnosis not present

## 2015-03-13 DIAGNOSIS — M79609 Pain in unspecified limb: Secondary | ICD-10-CM | POA: Diagnosis not present

## 2015-03-14 ENCOUNTER — Telehealth: Payer: Self-pay | Admitting: *Deleted

## 2015-03-14 DIAGNOSIS — J441 Chronic obstructive pulmonary disease with (acute) exacerbation: Secondary | ICD-10-CM | POA: Diagnosis not present

## 2015-03-14 DIAGNOSIS — Z7901 Long term (current) use of anticoagulants: Secondary | ICD-10-CM | POA: Diagnosis not present

## 2015-03-14 DIAGNOSIS — Z79891 Long term (current) use of opiate analgesic: Secondary | ICD-10-CM | POA: Diagnosis not present

## 2015-03-14 DIAGNOSIS — I4891 Unspecified atrial fibrillation: Secondary | ICD-10-CM | POA: Diagnosis not present

## 2015-03-14 DIAGNOSIS — Z9981 Dependence on supplemental oxygen: Secondary | ICD-10-CM | POA: Diagnosis not present

## 2015-03-14 DIAGNOSIS — J440 Chronic obstructive pulmonary disease with acute lower respiratory infection: Secondary | ICD-10-CM | POA: Diagnosis not present

## 2015-03-14 DIAGNOSIS — I739 Peripheral vascular disease, unspecified: Secondary | ICD-10-CM | POA: Diagnosis not present

## 2015-03-14 NOTE — Telephone Encounter (Signed)
"  Blood thinner information request" received. Pt scheduled for EGD 04/11/15. Signed today by Dr. Fletcher Anon, placed in medical records nurse fax bin to be faxed.

## 2015-03-15 ENCOUNTER — Encounter: Payer: Self-pay | Admitting: Family Medicine

## 2015-03-15 ENCOUNTER — Ambulatory Visit (INDEPENDENT_AMBULATORY_CARE_PROVIDER_SITE_OTHER): Payer: Medicare Other | Admitting: Family Medicine

## 2015-03-15 VITALS — BP 131/74 | HR 43 | Temp 97.6°F | Resp 16 | Ht 66.0 in | Wt 153.4 lb

## 2015-03-15 DIAGNOSIS — I5021 Acute systolic (congestive) heart failure: Secondary | ICD-10-CM | POA: Diagnosis not present

## 2015-03-15 DIAGNOSIS — J9601 Acute respiratory failure with hypoxia: Secondary | ICD-10-CM

## 2015-03-15 DIAGNOSIS — K219 Gastro-esophageal reflux disease without esophagitis: Secondary | ICD-10-CM | POA: Diagnosis not present

## 2015-03-15 DIAGNOSIS — I951 Orthostatic hypotension: Secondary | ICD-10-CM

## 2015-03-15 DIAGNOSIS — I10 Essential (primary) hypertension: Secondary | ICD-10-CM

## 2015-03-15 MED ORDER — CARVEDILOL 3.125 MG PO TABS: 3.1250 mg | ORAL_TABLET | Freq: Two times a day (BID) | ORAL | Status: DC

## 2015-03-15 NOTE — Progress Notes (Signed)
Name: [name] [name]   MRN: [id number]    DOB: [birth year]   Date:03/15/2015       Progress Note  Subjective  Chief Complaint  Chief Complaint  Patient presents with  . URI    Hospital f/u- 03/03/2015  03/08/2015.....finished steroid and levaquin.   . Dizziness    BP drops low when standing.    HPI  Here for f/u of hosp. For acute exacerbation of COPD and bronchitis.  Off antibiotics and prednisone.  Breathing is better.    C/o dizziness when she stands up.  BP drops when she stands.  She gets orthostatic with standing.   No problem-specific assessment & plan notes found for this encounter.   Past Medical History  Diagnosis Date  . HTN (hypertension)   . COPD (chronic obstructive pulmonary disease) (HCC)     a. on home O2 nightly.  . Diverticulosis   . Takotsubo cardiomyopathy 10/2012    a. 10/2012: presented w/ cardiogenic shock/systolic CHF/VDRF - EF 18% by cath, 40% by f/u echo - cath with mild nonobstructive CAD. b. Not discharged on ACEI due to hypotension.  . Cardiogenic shock ([place])     a. 10/2012 - due to Takotsubo CM.  [name] Respiratory failure ([place])     a. On home O2 nightly. b. VDRF 10/2012 - due to CAP/pulm edema in setting of cardiogenic shock/Takotsubo CM.  [name] CAP (community acquired pneumonia)     a. 10/2012 - will need f/u CXR in June 2014 to ensure clearance.  . Esophageal stricture     a. s/p dilitation 10/2012.  [name] Pleural effusion, right     a. s/p thoracentesis 10/28/12.  . Atrial fibrillation ([place])     a. Dx 10/2012, rate controlled, started on Xarelto.  . Claudication ([place])     a. ABI 10/2012 - R 0.87, L 0.74.  [name] GERD (gastroesophageal reflux disease)   . CKD (chronic kidney disease), stage III   . Breast CA (HCC)     hx  . Abdominal hernia     chronic  . CAD (coronary artery disease)     a. 10/2012 - presented w/ STEMI felt due to Takotsubo - mild nonobstructive dz by cath.  [name] STEMI (ST elevation myocardial infarction) ([place])   . Cellulitis   . CHF (congestive  heart failure) [facility])     Past Surgical History  Procedure Laterality Date  . Mastectomy    . Cataract extraction    . Esophagogastroduodenoscopy N/A 11/05/2012    Procedure: ESOPHAGOGASTRODUODENOSCOPY (EGD) with esophageal savary dilatation;  Surgeon: Missy Sabins, MD;  Location: [place];  Service: Endoscopy;  Laterality: N/A;  savary dil...no floro needed  . Bladder surgery    . Left heart catheterization with coronary angiogram N/A 10/24/2012    Procedure: LEFT HEART CATHETERIZATION WITH CORONARY ANGIOGRAM;  Surgeon: Sherren Mocha, MD;  Location: [facility] CATH LAB;  Service: Cardiovascular;  Laterality: N/A;  . Coronary angioplasty    . Peripheral vascular catheterization N/A 02/07/2015    Procedure: Abdominal Aortogram w/Lower Extremity;  Surgeon: Katha Cabal, MD;  Location: [place] CV LAB;  Service: Cardiovascular;  Laterality: N/A;  . Peripheral vascular catheterization  02/07/2015    Procedure: Lower Extremity Intervention;  Surgeon: Katha Cabal, MD;  Location: [place] CV LAB;  Service: Cardiovascular;;    Family History  Problem Relation Age of Onset  . Heart attack Mother     Social History   Social History  . Marital Status: Widowed  Spouse Name: N/A  . Number of Children: 1  . Years of Education: N/A   Occupational History  . Not on file.   Social History Main Topics  . Smoking status: Former Smoker -- 1.00 packs/day for 55 years    Types: Cigarettes  . Smokeless tobacco: Former Systems developer  . Alcohol Use: No  . Drug Use: No  . Sexual Activity: No   Other Topics Concern  . Not on file   Social History Narrative     Current outpatient prescriptions:  .  albuterol (PROVENTIL HFA;VENTOLIN HFA) 108 (90 BASE) MCG/ACT inhaler, Inhale 2 puffs into the lungs every 4 (four) hours as needed for wheezing or shortness of breath., Disp: , Rfl:  .  albuterol (PROVENTIL) (2.5 MG/3ML) 0.083% nebulizer solution, Take 2.5 mg by nebulization 4 (four) times  daily as needed for wheezing or shortness of breath., Disp: , Rfl:  .  Fluticasone-Salmeterol (ADVAIR) 250-50 MCG/DOSE AEPB, Inhale 1 puff into the lungs 2 (two) times daily., Disp: , Rfl:  .  furosemide (LASIX) 40 MG tablet, Take 80 mg by mouth 2 (two) times daily., Disp: , Rfl:  .  gabapentin (NEURONTIN) 100 MG capsule, TAKE 1 CAPSULE (100 MG TOTAL) BY MOUTH 3 (THREE) TIMES DAILY., Disp: , Rfl: 3 .  loratadine (CLARITIN) 10 MG tablet, Take 10 mg by mouth daily., Disp: , Rfl:  .  omeprazole (PRILOSEC) 20 MG capsule, Take 1 capsule (20 mg total) by mouth daily., Disp: 30 capsule, Rfl: 11 .  pyridOXINE (VITAMIN B-6) 100 MG tablet, Take 100 mg by mouth daily., Disp: , Rfl:  .  ranitidine (ZANTAC) 150 MG capsule, TAKE 1 CAPSULE BY MOUTH EVERY NIGHT, Disp: , Rfl: 6 .  Rivaroxaban (XARELTO) 15 MG TABS tablet, Take 15 mg by mouth at bedtime., Disp: , Rfl:  .  tiotropium (SPIRIVA) 18 MCG inhalation capsule, Place 18 mcg into inhaler and inhale daily., Disp: , Rfl:  .  vitamin B-12 (CYANOCOBALAMIN) 1000 MCG tablet, Take 1,000 mcg by mouth daily., Disp: , Rfl:  .  carvedilol (COREG) 3.125 MG tablet, Take 1 tablet (3.125 mg total) by mouth 2 (two) times daily with a meal., Disp: 60 tablet, Rfl: 6  Allergies  Allergen Reactions  . Penicillins Itching, Swelling, Rash and Other (See Comments)    Pt is unable to answer additional questions about this medication because it happened so long ago.  . Sulfa Antibiotics Other (See Comments)    Reaction:  Unknown      Review of Systems  Constitutional: Positive for malaise/fatigue. Negative for fever and chills.  Eyes: Negative for blurred vision and double vision.  Respiratory: Negative for cough, sputum production, shortness of breath and wheezing.   Cardiovascular: Negative for chest pain, palpitations and leg swelling.  Gastrointestinal: Negative for heartburn, abdominal pain and blood in stool.  Genitourinary: Negative for dysuria, urgency and frequency.   Neurological: Positive for dizziness (on standing.). Negative for tremors, sensory change, focal weakness, weakness and headaches.      Objective  Filed Vitals:   03/15/15 1113 03/15/15 1115 03/15/15 1116  BP: 115/67 94/58 131/74  Pulse: 86 93 43  Temp: 97.6 F (36.4 C)    Resp: 16    Height: 5' 6"  (1.676 m)    Weight: 153 lb 6.4 oz (69.582 kg)      Physical Exam  Constitutional: She is well-developed, well-nourished, and in no distress. No distress.  HENT:  Head: Normocephalic and atraumatic.  Neck: Normal range of motion. Neck  supple. Carotid bruit is not present. No thyromegaly present.  Cardiovascular: Normal rate and normal heart sounds.  An irregularly irregular rhythm present. Exam reveals no gallop and no friction rub.   No murmur heard. BP dropped from 120 sys. To 90 sys. With standing.  Pulmonary/Chest: Effort normal and breath sounds normal. No respiratory distress. She has no wheezes. She has no rales.  Musculoskeletal: She exhibits no edema.  Lymphadenopathy:    She has no cervical adenopathy.  Vitals reviewed.      Recent Results (from the past 2160 hour(s))  Basic Metabolic Panel (BMET)     Status: Abnormal   Collection Time: 12/23/14 10:42 AM  Result Value Ref Range   Glucose 111 (H) 65 - 99 mg/dL   BUN 17 10 - 36 mg/dL   Creatinine, Ser 1.08 (H) 0.57 - 1.00 mg/dL   GFR calc non Af Amer 45 (L) >59 mL/min/1.73   GFR calc Af Amer 52 (L) >59 mL/min/1.73   BUN/Creatinine Ratio 16 11 - 26   Sodium 140 134 - 144 mmol/L   Potassium 4.0 3.5 - 5.2 mmol/L   Chloride 93 (L) 97 - 108 mmol/L   CO2 30 (H) 18 - 29 mmol/L   Calcium 10.0 8.7 - 10.3 mg/dL  BUN     Status: Abnormal   Collection Time: 02/07/15  7:59 AM  Result Value Ref Range   BUN 21 (H) 6 - 20 mg/dL  Creatinine, serum     Status: Abnormal   Collection Time: 02/07/15  7:59 AM  Result Value Ref Range   Creatinine, Ser 1.25 (H) 0.44 - 1.00 mg/dL   GFR calc non Af Amer 37 (L) >60 mL/min   GFR  calc Af Amer 43 (L) >60 mL/min    Comment: (NOTE) The eGFR has been calculated using the CKD EPI equation. This calculation has not been validated in all clinical situations. eGFR's persistently <60 mL/min signify possible Chronic Kidney Disease.   Protime-INR     Status: None   Collection Time: 02/07/15  7:59 AM  Result Value Ref Range   Prothrombin Time 13.1 11.4 - 15.0 seconds   INR 0.97   Urine Culture     Status: None   Collection Time: 02/16/15 12:00 AM  Result Value Ref Range   Urine Culture, Routine Final report    Urine Culture result 1 Comment     Comment: Mixed urogenital flora 10,000-25,000 colony forming units per mL   POCT Urinalysis Dipstick     Status: Abnormal   Collection Time: 02/16/15  9:59 AM  Result Value Ref Range   Color, UA yellow    Clarity, UA clear    Glucose, UA negative    Bilirubin, UA negative    Ketones, UA negative    Spec Grav, UA 1.010    Blood, UA trace    pH, UA 5.0    Protein, UA negative    Urobilinogen, UA negative    Nitrite, UA negative    Leukocytes, UA Negative Negative  Troponin I     Status: Abnormal   Collection Time: 03/03/15  3:01 PM  Result Value Ref Range   Troponin I 0.05 (H) <0.031 ng/mL    Comment: READ BACK AND VERIFIED WITH CASEY PIERCE AT 1555 03/03/15 BY [facility]        PERSISTENTLY INCREASED TROPONIN VALUES IN THE RANGE OF 0.04-0.49 ng/mL CAN BE SEEN IN:       -UNSTABLE ANGINA       -CONGESTIVE  HEART FAILURE       -MYOCARDITIS       -CHEST TRAUMA       -ARRYHTHMIAS       -LATE PRESENTING MYOCARDIAL INFARCTION       -COPD   CLINICAL FOLLOW-UP RECOMMENDED.   Comprehensive metabolic panel     Status: Abnormal   Collection Time: 03/03/15  3:01 PM  Result Value Ref Range   Sodium 133 (L) 135 - 145 mmol/L   Potassium 3.4 (L) 3.5 - 5.1 mmol/L   Chloride 87 (L) 101 - 111 mmol/L   CO2 33 (H) 22 - 32 mmol/L   Glucose, Bld 159 (H) 65 - 99 mg/dL   BUN 18 6 - 20 mg/dL   Creatinine, Ser 1.17 (H) 0.44 - 1.00 mg/dL    Calcium 8.7 (L) 8.9 - 10.3 mg/dL   Total Protein 7.8 6.5 - 8.1 g/dL   Albumin 3.6 3.5 - 5.0 g/dL   AST 22 15 - 41 U/L   ALT 13 (L) 14 - 54 U/L   Alkaline Phosphatase 61 38 - 126 U/L   Total Bilirubin 1.1 0.3 - 1.2 mg/dL   GFR calc non Af Amer 40 (L) >60 mL/min   GFR calc Af Amer 46 (L) >60 mL/min    Comment: (NOTE) The eGFR has been calculated using the CKD EPI equation. This calculation has not been validated in all clinical situations. eGFR's persistently <60 mL/min signify possible Chronic Kidney Disease.    Anion gap 13 5 - 15  CBC     Status: Abnormal   Collection Time: 03/03/15  3:01 PM  Result Value Ref Range   WBC 11.5 (H) 3.6 - 11.0 K/uL   RBC 3.70 (L) 3.80 - 5.20 MIL/uL   Hemoglobin 11.7 (L) 12.0 - 16.0 g/dL   HCT 34.7 (L) 35.0 - 47.0 %   MCV 93.8 80.0 - 100.0 fL   MCH 31.5 26.0 - 34.0 pg   MCHC 33.6 32.0 - 36.0 g/dL   RDW 14.0 11.5 - 14.5 %   Platelets 185 150 - 440 K/uL  Brain natriuretic peptide     Status: Abnormal   Collection Time: 03/03/15  5:15 PM  Result Value Ref Range   B Natriuretic Peptide 241.0 (H) 0.0 - 100.0 pg/mL  Blood gas, arterial     Status: Abnormal   Collection Time: 03/03/15  5:30 PM  Result Value Ref Range   FIO2 0.28    Delivery systems NASAL CANNULA    pH, Arterial 7.49 (H) 7.350 - 7.450   pCO2 arterial 58 (H) 32.0 - 48.0 mmHg   pO2, Arterial 76 (L) 83.0 - 108.0 mmHg   Bicarbonate 44.2 (H) 21.0 - 28.0 mEq/L   Acid-Base Excess 18.0 (H) 0.0 - 3.0 mmol/L   O2 Saturation 96.1 %   Patient temperature 37.0    Collection site RIGHT RADIAL    Sample type ARTERIAL DRAW    Allens test (pass/fail) YES (A) PASS  MRSA PCR Screening     Status: None   Collection Time: 03/04/15  8:30 AM  Result Value Ref Range   MRSA by PCR NEGATIVE NEGATIVE    Comment:        The GeneXpert MRSA Assay (FDA approved for NASAL specimens only), is one component of a comprehensive MRSA colonization surveillance program. It is not intended to diagnose  MRSA infection nor to guide or monitor treatment for MRSA infections.   Troponin I     Status: None   Collection  Time: 03/06/15 11:53 AM  Result Value Ref Range   Troponin I <0.03 <0.031 ng/mL    Comment:        NO INDICATION OF MYOCARDIAL INJURY.   Creatinine, serum     Status: Abnormal   Collection Time: 03/06/15 11:53 AM  Result Value Ref Range   Creatinine, Ser 1.26 (H) 0.44 - 1.00 mg/dL   GFR calc non Af Amer 36 (L) >60 mL/min   GFR calc Af Amer 42 (L) >60 mL/min    Comment: (NOTE) The eGFR has been calculated using the CKD EPI equation. This calculation has not been validated in all clinical situations. eGFR's persistently <60 mL/min signify possible Chronic Kidney Disease.   Troponin I     Status: None   Collection Time: 03/06/15  3:05 PM  Result Value Ref Range   Troponin I <0.03 <0.031 ng/mL    Comment:        NO INDICATION OF MYOCARDIAL INJURY.   Troponin I     Status: None   Collection Time: 03/06/15  7:15 PM  Result Value Ref Range   Troponin I <0.03 <0.031 ng/mL    Comment:        NO INDICATION OF MYOCARDIAL INJURY.   CBC     Status: None   Collection Time: 03/07/15  9:52 AM  Result Value Ref Range   WBC 9.9 3.6 - 11.0 K/uL   RBC 4.08 3.80 - 5.20 MIL/uL   Hemoglobin 12.5 12.0 - 16.0 g/dL   HCT 38.4 35.0 - 47.0 %   MCV 94.2 80.0 - 100.0 fL   MCH 30.7 26.0 - 34.0 pg   MCHC 32.6 32.0 - 36.0 g/dL   RDW 13.8 11.5 - 14.5 %   Platelets 237 150 - 440 K/uL     Assessment & Plan  Problem List Items Addressed This Visit      Cardiovascular and Mediastinum   Acute systolic CHF (congestive heart failure) (HCC) - Primary   Relevant Medications   carvedilol (COREG) 3.125 MG tablet   Essential hypertension   Relevant Medications   carvedilol (COREG) 3.125 MG tablet     Respiratory   Acute respiratory failure (HCC)     Digestive   GERD (gastroesophageal reflux disease)   Relevant Medications   ranitidine (ZANTAC) 150 MG capsule    Other Visit  Diagnoses    Orthostatic hypotension        Relevant Medications    carvedilol (COREG) 3.125 MG tablet       Meds ordered this encounter  Medications  . gabapentin (NEURONTIN) 100 MG capsule    Sig: TAKE 1 CAPSULE (100 MG TOTAL) BY MOUTH 3 (THREE) TIMES DAILY.    Refill:  3  . ranitidine (ZANTAC) 150 MG capsule    Sig: TAKE 1 CAPSULE BY MOUTH EVERY NIGHT    Refill:  6  . carvedilol (COREG) 3.125 MG tablet    Sig: Take 1 tablet (3.125 mg total) by mouth 2 (two) times daily with a meal.    Dispense:  60 tablet    Refill:  6    1. Acute systolic CHF (congestive heart failure) (HCC)  - carvedilol (COREG) 3.125 MG tablet; Take 1 tablet (3.125 mg total) by mouth 2 (two) times daily with a meal.  Dispense: 60 tablet; Refill: 6  2. Essential hypertension   3. Acute respiratory failure with hypoxia (HCC)   4. Gastroesophageal reflux disease without esophagitis   5. Orthostatic hypotension

## 2015-03-15 NOTE — Patient Instructions (Signed)
Keep appts. With Dr. Allen Norris and Dr. Fletcher Anon.  Cont. Other meds

## 2015-03-16 DIAGNOSIS — Z9981 Dependence on supplemental oxygen: Secondary | ICD-10-CM | POA: Diagnosis not present

## 2015-03-16 DIAGNOSIS — J440 Chronic obstructive pulmonary disease with acute lower respiratory infection: Secondary | ICD-10-CM | POA: Diagnosis not present

## 2015-03-16 DIAGNOSIS — J441 Chronic obstructive pulmonary disease with (acute) exacerbation: Secondary | ICD-10-CM | POA: Diagnosis not present

## 2015-03-16 DIAGNOSIS — I4891 Unspecified atrial fibrillation: Secondary | ICD-10-CM | POA: Diagnosis not present

## 2015-03-16 DIAGNOSIS — I739 Peripheral vascular disease, unspecified: Secondary | ICD-10-CM | POA: Diagnosis not present

## 2015-03-16 DIAGNOSIS — J449 Chronic obstructive pulmonary disease, unspecified: Secondary | ICD-10-CM | POA: Diagnosis not present

## 2015-03-16 DIAGNOSIS — Z79891 Long term (current) use of opiate analgesic: Secondary | ICD-10-CM | POA: Diagnosis not present

## 2015-03-16 DIAGNOSIS — Z7901 Long term (current) use of anticoagulants: Secondary | ICD-10-CM | POA: Diagnosis not present

## 2015-03-20 DIAGNOSIS — I739 Peripheral vascular disease, unspecified: Secondary | ICD-10-CM | POA: Diagnosis not present

## 2015-03-20 DIAGNOSIS — J441 Chronic obstructive pulmonary disease with (acute) exacerbation: Secondary | ICD-10-CM | POA: Diagnosis not present

## 2015-03-20 DIAGNOSIS — Z79891 Long term (current) use of opiate analgesic: Secondary | ICD-10-CM | POA: Diagnosis not present

## 2015-03-20 DIAGNOSIS — I4891 Unspecified atrial fibrillation: Secondary | ICD-10-CM | POA: Diagnosis not present

## 2015-03-20 DIAGNOSIS — Z7901 Long term (current) use of anticoagulants: Secondary | ICD-10-CM | POA: Diagnosis not present

## 2015-03-20 DIAGNOSIS — Z9981 Dependence on supplemental oxygen: Secondary | ICD-10-CM | POA: Diagnosis not present

## 2015-03-20 DIAGNOSIS — J440 Chronic obstructive pulmonary disease with acute lower respiratory infection: Secondary | ICD-10-CM | POA: Diagnosis not present

## 2015-03-21 DIAGNOSIS — J449 Chronic obstructive pulmonary disease, unspecified: Secondary | ICD-10-CM | POA: Diagnosis not present

## 2015-03-22 DIAGNOSIS — Z7901 Long term (current) use of anticoagulants: Secondary | ICD-10-CM | POA: Diagnosis not present

## 2015-03-22 DIAGNOSIS — I739 Peripheral vascular disease, unspecified: Secondary | ICD-10-CM | POA: Diagnosis not present

## 2015-03-22 DIAGNOSIS — Z79891 Long term (current) use of opiate analgesic: Secondary | ICD-10-CM | POA: Diagnosis not present

## 2015-03-22 DIAGNOSIS — I4891 Unspecified atrial fibrillation: Secondary | ICD-10-CM | POA: Diagnosis not present

## 2015-03-22 DIAGNOSIS — J440 Chronic obstructive pulmonary disease with acute lower respiratory infection: Secondary | ICD-10-CM | POA: Diagnosis not present

## 2015-03-22 DIAGNOSIS — Z9981 Dependence on supplemental oxygen: Secondary | ICD-10-CM | POA: Diagnosis not present

## 2015-03-22 DIAGNOSIS — J441 Chronic obstructive pulmonary disease with (acute) exacerbation: Secondary | ICD-10-CM | POA: Diagnosis not present

## 2015-03-24 ENCOUNTER — Telehealth: Payer: Self-pay | Admitting: Family Medicine

## 2015-03-24 DIAGNOSIS — J441 Chronic obstructive pulmonary disease with (acute) exacerbation: Secondary | ICD-10-CM | POA: Diagnosis not present

## 2015-03-24 DIAGNOSIS — Z79891 Long term (current) use of opiate analgesic: Secondary | ICD-10-CM | POA: Diagnosis not present

## 2015-03-24 DIAGNOSIS — Z7901 Long term (current) use of anticoagulants: Secondary | ICD-10-CM | POA: Diagnosis not present

## 2015-03-24 DIAGNOSIS — I4891 Unspecified atrial fibrillation: Secondary | ICD-10-CM | POA: Diagnosis not present

## 2015-03-24 DIAGNOSIS — J440 Chronic obstructive pulmonary disease with acute lower respiratory infection: Secondary | ICD-10-CM | POA: Diagnosis not present

## 2015-03-24 DIAGNOSIS — Z9981 Dependence on supplemental oxygen: Secondary | ICD-10-CM | POA: Diagnosis not present

## 2015-03-24 DIAGNOSIS — I739 Peripheral vascular disease, unspecified: Secondary | ICD-10-CM | POA: Diagnosis not present

## 2015-03-24 NOTE — Telephone Encounter (Signed)
Informed [name] [name] [facility]) of 2 week extension per Dr. Luan Pulling.[place]

## 2015-03-24 NOTE — Telephone Encounter (Signed)
[name] [name] from [facility] said today was her last visit with pt.  She would like to extend the visits for at least 2 more weeks.  The pt does have a little wheezing in chest and burning during urination.  Please call [phone] with verbal.

## 2015-03-24 NOTE — Progress Notes (Signed)
   03/04/15 1500  PT Visit Information  Last PT Received On 03/04/15  Assistance Needed +1  History of Present Illness Patient is a 81 y.o. female admitted on 23 September for SOB. PMH includes HTN, cardiomyopathy.  Precautions  Precautions Fall  Restrictions  Weight Bearing Restrictions No  Home Living  Family/patient expects to be discharged to: Private residence  Living Arrangements Alone  Available Help at Discharge Friend(s)  Type of [place] Access Level entry  [place] One level  Bathroom Shower/Tub Tub/shower unit  Research officer, trade union - single point  Additional Comments Patient has [facility] but has not used it in the recent past  Prior Function  Level of [place] Independent;Needs assistance  Gait / Transfers Assistance Needed Independent  ADL's / [place] performs grocery shopping/takes to appointments  Communication  Communication No difficulties  Pain Assessment  Pain Assessment No/denies pain  Cognition  Arousal/Alertness Awake/alert  Behavior During Therapy WFL for tasks assessed/performed  Overall Cognitive Status Within Functional Limits for tasks assessed  Upper Extremity Assessment  Upper Extremity Assessment Overall WFL for tasks assessed  Lower Extremity Assessment  Lower Extremity Assessment Overall WFL for tasks assessed  Cervical / Trunk Assessment  Cervical / Trunk Assessment Kyphotic  Transfers  Overall transfer level Independent  Equipment used None  Ambulation/Gait  Ambulation/Gait assistance Min guard  Ambulation Distance (Feet) 130 Feet  Assistive device None  Gait Pattern/deviations Decreased stride length  General Gait Details Patient ambulates at decreased cadence with 3L O2, taking standing rest breaks as necessary. O2 saturations >92% pre and post ambulation. Patient had tendency to use walls/furniture when available. Improved steadiness with HHA.  Balance  Overall balance  assessment Independent  PT - End of Session  Equipment Utilized During Treatment Gait belt;Oxygen  Activity Tolerance Patient tolerated treatment well  Patient left in chair;with call bell/phone within reach;with chair alarm set  Nurse Communication Mobility status  PT Assessment  PT Therapy Diagnosis  Difficulty walking  PT Recommendation/Assessment [place] does not need any further PT services  No Skilled PT All education completed;Patient at baseline level of functioning;Patient is modified independent with all activity/mobility  PT Recommendation  Follow Up Recommendations No PT follow up  PT equipment None recommended by PT;Other (comment) (Patient has [place])  Acute Rehab PT Goals  Patient Stated Goal "To feel myself again"  PT Goal Formulation With patient  Time For Goal Achievement 03/18/15  Potential to Achieve Goals Good  PT Time Calculation  PT Start Time (ACUTE ONLY) 1510  PT Stop Time (ACUTE ONLY) 1530  PT Time Calculation (min) (ACUTE ONLY) 20 min  PT G-Codes **NOT FOR INPATIENT CLASS**  Functional Assessment Tool Used clinical judgment  Functional Limitation Mobility: Walking and moving around  Mobility: Walking and Moving Around Current Status (I5038) CJ  Mobility: Walking and Moving Around Goal Status (U8280) CJ  Mobility: Walking and Moving Around Discharge Status (K3491) CJ  PT General Charges  $$ ACUTE PT VISIT 1 Procedure  PT Evaluation  $Initial PT Evaluation Tier I 1 Procedure   Late entry for missed G-code. Based on review of the evaluation and goals by Cindie Laroche PT, DPT.  Lady Deutscher PT, DPT 03/24/2015 1:55 PM

## 2015-03-24 NOTE — Telephone Encounter (Signed)
Please advise 

## 2015-03-24 NOTE — Telephone Encounter (Signed)
Yes, please give verbal to extend home visits for another 2 weeks.-jh

## 2015-03-27 ENCOUNTER — Ambulatory Visit: Payer: Medicare Other | Admitting: Family Medicine

## 2015-03-27 DIAGNOSIS — J441 Chronic obstructive pulmonary disease with (acute) exacerbation: Secondary | ICD-10-CM | POA: Diagnosis not present

## 2015-03-27 DIAGNOSIS — Z9981 Dependence on supplemental oxygen: Secondary | ICD-10-CM | POA: Diagnosis not present

## 2015-03-27 DIAGNOSIS — Z7901 Long term (current) use of anticoagulants: Secondary | ICD-10-CM | POA: Diagnosis not present

## 2015-03-27 DIAGNOSIS — J440 Chronic obstructive pulmonary disease with acute lower respiratory infection: Secondary | ICD-10-CM | POA: Diagnosis not present

## 2015-03-27 DIAGNOSIS — I739 Peripheral vascular disease, unspecified: Secondary | ICD-10-CM | POA: Diagnosis not present

## 2015-03-27 DIAGNOSIS — I4891 Unspecified atrial fibrillation: Secondary | ICD-10-CM | POA: Diagnosis not present

## 2015-03-27 DIAGNOSIS — Z79891 Long term (current) use of opiate analgesic: Secondary | ICD-10-CM | POA: Diagnosis not present

## 2015-03-28 ENCOUNTER — Encounter: Payer: Self-pay | Admitting: Family Medicine

## 2015-03-28 ENCOUNTER — Other Ambulatory Visit: Payer: Self-pay | Admitting: Family Medicine

## 2015-03-28 ENCOUNTER — Ambulatory Visit (INDEPENDENT_AMBULATORY_CARE_PROVIDER_SITE_OTHER): Payer: Medicare Other | Admitting: Family Medicine

## 2015-03-28 ENCOUNTER — Encounter (INDEPENDENT_AMBULATORY_CARE_PROVIDER_SITE_OTHER): Payer: Self-pay

## 2015-03-28 VITALS — BP 127/67 | HR 78 | Temp 97.5°F | Resp 16 | Ht 66.0 in | Wt 155.6 lb

## 2015-03-28 DIAGNOSIS — K5732 Diverticulitis of large intestine without perforation or abscess without bleeding: Secondary | ICD-10-CM | POA: Insufficient documentation

## 2015-03-28 DIAGNOSIS — R319 Hematuria, unspecified: Secondary | ICD-10-CM | POA: Diagnosis not present

## 2015-03-28 DIAGNOSIS — N390 Urinary tract infection, site not specified: Secondary | ICD-10-CM | POA: Diagnosis not present

## 2015-03-28 DIAGNOSIS — I481 Persistent atrial fibrillation: Secondary | ICD-10-CM | POA: Diagnosis not present

## 2015-03-28 DIAGNOSIS — J9621 Acute and chronic respiratory failure with hypoxia: Secondary | ICD-10-CM | POA: Diagnosis not present

## 2015-03-28 DIAGNOSIS — I4819 Other persistent atrial fibrillation: Secondary | ICD-10-CM

## 2015-03-28 LAB — POCT URINALYSIS DIPSTICK
BILIRUBIN UA: NEGATIVE
Glucose, UA: NEGATIVE
Ketones, UA: NEGATIVE
NITRITE UA: NEGATIVE
PH UA: 5
PROTEIN UA: NEGATIVE
Spec Grav, UA: 1.01
UROBILINOGEN UA: NEGATIVE

## 2015-03-28 MED ORDER — CIPROFLOXACIN HCL 500 MG PO TABS: 500.0000 mg | ORAL_TABLET | Freq: Two times a day (BID) | ORAL | Status: DC

## 2015-03-28 MED ORDER — METRONIDAZOLE 500 MG PO TABS: 500.0000 mg | ORAL_TABLET | Freq: Three times a day (TID) | ORAL | Status: DC

## 2015-03-28 NOTE — Patient Instructions (Signed)
Go top [facility] if any worsening of abdominal pain of rectal bleeding.

## 2015-03-28 NOTE — Progress Notes (Signed)
Name: [name] [name]   MRN: [id number]    DOB: 1922/11/03   Date:03/28/2015       Progress Note  Subjective  Chief Complaint  Chief Complaint  Patient presents with  . Shortness of Breath    past 2 days improved  . Diverticulitis    onset last week  . Urinary Tract Infection    obtw uti Sx from last 2 days    HPI Here for f/u of asthma.  Has had some breathing problems over past 2 weeks, but feeling breathing much better past 2 days. Also with dysuria and some frequency.    States that she feels that diverticulitis is flairting up again.  Stools smell strong.  No blood.  Some pain in  L. Mid abd..  No problem-specific assessment & plan notes found for this encounter.   Past Medical History  Diagnosis Date  . HTN (hypertension)   . COPD (chronic obstructive pulmonary disease) (HCC)     a. on home O2 nightly.  . Diverticulosis   . Takotsubo cardiomyopathy 10/2012    a. 10/2012: presented w/ cardiogenic shock/systolic CHF/VDRF - EF 16% by cath, 40% by f/u echo - cath with mild nonobstructive CAD. b. Not discharged on ACEI due to hypotension.  . Cardiogenic shock ([place])     a. 10/2012 - due to Takotsubo CM.  [name] Respiratory failure ([place])     a. On home O2 nightly. b. VDRF 10/2012 - due to CAP/pulm edema in setting of cardiogenic shock/Takotsubo CM.  [name] CAP (community acquired pneumonia)     a. 10/2012 - will need f/u CXR in June 2014 to ensure clearance.  . Esophageal stricture     a. s/p dilitation 10/2012.  [name] Pleural effusion, right     a. s/p thoracentesis 10/28/12.  . Atrial fibrillation ([place])     a. Dx 10/2012, rate controlled, started on Xarelto.  . Claudication ([place])     a. ABI 10/2012 - R 0.87, L 0.74.  [name] GERD (gastroesophageal reflux disease)   . CKD (chronic kidney disease), stage III   . Breast CA (HCC)     hx  . Abdominal hernia     chronic  . CAD (coronary artery disease)     a. 10/2012 - presented w/ STEMI felt due to Takotsubo - mild nonobstructive dz by cath.  [name] STEMI  (ST elevation myocardial infarction) ([place])   . Cellulitis   . CHF (congestive heart failure) ([place])     Social History  Substance Use Topics  . Smoking status: Former Smoker -- 1.00 packs/day for 55 years    Types: Cigarettes  . Smokeless tobacco: Former Systems developer  . Alcohol Use: No     Current outpatient prescriptions:  .  albuterol (PROVENTIL HFA;VENTOLIN HFA) 108 (90 BASE) MCG/ACT inhaler, Inhale 2 puffs into the lungs every 4 (four) hours as needed for wheezing or shortness of breath., Disp: , Rfl:  .  albuterol (PROVENTIL) (2.5 MG/3ML) 0.083% nebulizer solution, Take 2.5 mg by nebulization 4 (four) times daily as needed for wheezing or shortness of breath., Disp: , Rfl:  .  carvedilol (COREG) 3.125 MG tablet, Take 1 tablet (3.125 mg total) by mouth 2 (two) times daily with a meal., Disp: 60 tablet, Rfl: 6 .  Fluticasone-Salmeterol (ADVAIR) 250-50 MCG/DOSE AEPB, Inhale 1 puff into the lungs 2 (two) times daily., Disp: , Rfl:  .  furosemide (LASIX) 40 MG tablet, Take 80 mg by mouth 2 (two) times daily., Disp: , Rfl:  .  gabapentin (NEURONTIN) 100 MG capsule, TAKE 1 CAPSULE (100 MG TOTAL) BY MOUTH 3 (THREE) TIMES DAILY., Disp: , Rfl: 3 .  loratadine (CLARITIN) 10 MG tablet, Take 10 mg by mouth daily., Disp: , Rfl:  .  omeprazole (PRILOSEC) 20 MG capsule, Take 1 capsule (20 mg total) by mouth daily., Disp: 30 capsule, Rfl: 11 .  pyridOXINE (VITAMIN B-6) 100 MG tablet, Take 100 mg by mouth daily., Disp: , Rfl:  .  ranitidine (ZANTAC) 150 MG capsule, TAKE 1 CAPSULE BY MOUTH EVERY NIGHT, Disp: , Rfl: 6 .  Rivaroxaban (XARELTO) 15 MG TABS tablet, Take 15 mg by mouth at bedtime., Disp: , Rfl:  .  tiotropium (SPIRIVA) 18 MCG inhalation capsule, Place 18 mcg into inhaler and inhale daily., Disp: , Rfl:  .  vitamin B-12 (CYANOCOBALAMIN) 1000 MCG tablet, Take 1,000 mcg by mouth daily., Disp: , Rfl:   Allergies  Allergen Reactions  . Penicillins Itching, Swelling, Rash and Other (See Comments)    Pt  is unable to answer additional questions about this medication because it happened so long ago.  . Sulfa Antibiotics Other (See Comments)    Reaction:  Unknown     Review of Systems  Constitutional: Positive for malaise/fatigue. Negative for fever and chills.  HENT: Negative for hearing loss.   Eyes: Negative for blurred vision and double vision.  Respiratory: Positive for shortness of breath and wheezing. Negative for cough.   Cardiovascular: Negative for chest pain, palpitations and leg swelling.  Gastrointestinal: Positive for nausea, abdominal pain and constipation.  Genitourinary: Positive for dysuria and frequency. Negative for urgency.  Skin: Negative for rash.  Neurological: Negative for headaches.      Objective  Filed Vitals:   03/28/15 0944  BP: 127/67  Pulse: 78  Temp: 97.5 F (36.4 C)  TempSrc: Oral  Resp: 16  Height: 5' 6"  (1.676 m)  Weight: 155 lb 9.6 oz (70.58 kg)  SpO2: 90%     Physical Exam  Constitutional: She is oriented to person, place, and time and well-developed, well-nourished, and in no distress. No distress.  HENT:  Head: Normocephalic and atraumatic.  Eyes: Conjunctivae and EOM are normal. Pupils are equal, round, and reactive to light. No scleral icterus.  Neck: Normal range of motion. Neck supple. Carotid bruit is not present. No thyromegaly present.  Cardiovascular: Normal rate and normal heart sounds.  An irregularly irregular rhythm present. Exam reveals no gallop and no friction rub.   No murmur heard. Pulmonary/Chest: Effort normal and breath sounds normal. No respiratory distress. She has no wheezes. She has no rales.  Abdominal: Soft. Bowel sounds are normal. She exhibits no distension and no abdominal bruit. There is no tenderness. There is no rebound.    Musculoskeletal: She exhibits no edema.  Lymphadenopathy:    She has no cervical adenopathy.  Neurological: She is alert and oriented to person, place, and time.  Vitals  reviewed.     Recent Results (from the past 2160 hour(s))  BUN     Status: Abnormal   Collection Time: 02/07/15  7:59 AM  Result Value Ref Range   BUN 21 (H) 6 - 20 mg/dL  Creatinine, serum     Status: Abnormal   Collection Time: 02/07/15  7:59 AM  Result Value Ref Range   Creatinine, Ser 1.25 (H) 0.44 - 1.00 mg/dL   GFR calc non Af Amer 37 (L) >60 mL/min   GFR calc Af Amer 43 (L) >60 mL/min    Comment: (NOTE)  The eGFR has been calculated using the CKD EPI equation. This calculation has not been validated in all clinical situations. eGFR's persistently <60 mL/min signify possible Chronic Kidney Disease.   Protime-INR     Status: None   Collection Time: 02/07/15  7:59 AM  Result Value Ref Range   Prothrombin Time 13.1 11.4 - 15.0 seconds   INR 0.97   Urine Culture     Status: None   Collection Time: 02/16/15 12:00 AM  Result Value Ref Range   Urine Culture, Routine Final report    Urine Culture result 1 Comment     Comment: Mixed urogenital flora 10,000-25,000 colony forming units per mL   POCT Urinalysis Dipstick     Status: Abnormal   Collection Time: 02/16/15  9:59 AM  Result Value Ref Range   Color, UA yellow    Clarity, UA clear    Glucose, UA negative    Bilirubin, UA negative    Ketones, UA negative    Spec Grav, UA 1.010    Blood, UA trace    pH, UA 5.0    Protein, UA negative    Urobilinogen, UA negative    Nitrite, UA negative    Leukocytes, UA Negative Negative  Troponin I     Status: Abnormal   Collection Time: 03/03/15  3:01 PM  Result Value Ref Range   Troponin I 0.05 (H) <0.031 ng/mL    Comment: READ BACK AND VERIFIED WITH CASEY PIERCE AT 1555 03/03/15 BY [facility]        PERSISTENTLY INCREASED TROPONIN VALUES IN THE RANGE OF 0.04-0.49 ng/mL CAN BE SEEN IN:       -UNSTABLE ANGINA       -CONGESTIVE HEART FAILURE       -MYOCARDITIS       -CHEST TRAUMA       -ARRYHTHMIAS       -LATE PRESENTING MYOCARDIAL INFARCTION       -COPD   CLINICAL  FOLLOW-UP RECOMMENDED.   Comprehensive metabolic panel     Status: Abnormal   Collection Time: 03/03/15  3:01 PM  Result Value Ref Range   Sodium 133 (L) 135 - 145 mmol/L   Potassium 3.4 (L) 3.5 - 5.1 mmol/L   Chloride 87 (L) 101 - 111 mmol/L   CO2 33 (H) 22 - 32 mmol/L   Glucose, Bld 159 (H) 65 - 99 mg/dL   BUN 18 6 - 20 mg/dL   Creatinine, Ser 1.17 (H) 0.44 - 1.00 mg/dL   Calcium 8.7 (L) 8.9 - 10.3 mg/dL   Total Protein 7.8 6.5 - 8.1 g/dL   Albumin 3.6 3.5 - 5.0 g/dL   AST 22 15 - 41 U/L   ALT 13 (L) 14 - 54 U/L   Alkaline Phosphatase 61 38 - 126 U/L   Total Bilirubin 1.1 0.3 - 1.2 mg/dL   GFR calc non Af Amer 40 (L) >60 mL/min   GFR calc Af Amer 46 (L) >60 mL/min    Comment: (NOTE) The eGFR has been calculated using the CKD EPI equation. This calculation has not been validated in all clinical situations. eGFR's persistently <60 mL/min signify possible Chronic Kidney Disease.    Anion gap 13 5 - 15  CBC     Status: Abnormal   Collection Time: 03/03/15  3:01 PM  Result Value Ref Range   WBC 11.5 (H) 3.6 - 11.0 K/uL   RBC 3.70 (L) 3.80 - 5.20 MIL/uL   Hemoglobin 11.7 (L) 12.0 -  16.0 g/dL   HCT 34.7 (L) 35.0 - 47.0 %   MCV 93.8 80.0 - 100.0 fL   MCH 31.5 26.0 - 34.0 pg   MCHC 33.6 32.0 - 36.0 g/dL   RDW 14.0 11.5 - 14.5 %   Platelets 185 150 - 440 K/uL  Brain natriuretic peptide     Status: Abnormal   Collection Time: 03/03/15  5:15 PM  Result Value Ref Range   B Natriuretic Peptide 241.0 (H) 0.0 - 100.0 pg/mL  Blood gas, arterial     Status: Abnormal   Collection Time: 03/03/15  5:30 PM  Result Value Ref Range   FIO2 0.28    Delivery systems NASAL CANNULA    pH, Arterial 7.49 (H) 7.350 - 7.450   pCO2 arterial 58 (H) 32.0 - 48.0 mmHg   pO2, Arterial 76 (L) 83.0 - 108.0 mmHg   Bicarbonate 44.2 (H) 21.0 - 28.0 mEq/L   Acid-Base Excess 18.0 (H) 0.0 - 3.0 mmol/L   O2 Saturation 96.1 %   Patient temperature 37.0    Collection site RIGHT RADIAL    Sample type  ARTERIAL DRAW    Allens test (pass/fail) YES (A) PASS  MRSA PCR Screening     Status: None   Collection Time: 03/04/15  8:30 AM  Result Value Ref Range   MRSA by PCR NEGATIVE NEGATIVE    Comment:        The GeneXpert MRSA Assay (FDA approved for NASAL specimens only), is one component of a comprehensive MRSA colonization surveillance program. It is not intended to diagnose MRSA infection nor to guide or monitor treatment for MRSA infections.   Troponin I     Status: None   Collection Time: 03/06/15 11:53 AM  Result Value Ref Range   Troponin I <0.03 <0.031 ng/mL    Comment:        NO INDICATION OF MYOCARDIAL INJURY.   Creatinine, serum     Status: Abnormal   Collection Time: 03/06/15 11:53 AM  Result Value Ref Range   Creatinine, Ser 1.26 (H) 0.44 - 1.00 mg/dL   GFR calc non Af Amer 36 (L) >60 mL/min   GFR calc Af Amer 42 (L) >60 mL/min    Comment: (NOTE) The eGFR has been calculated using the CKD EPI equation. This calculation has not been validated in all clinical situations. eGFR's persistently <60 mL/min signify possible Chronic Kidney Disease.   Troponin I     Status: None   Collection Time: 03/06/15  3:05 PM  Result Value Ref Range   Troponin I <0.03 <0.031 ng/mL    Comment:        NO INDICATION OF MYOCARDIAL INJURY.   Troponin I     Status: None   Collection Time: 03/06/15  7:15 PM  Result Value Ref Range   Troponin I <0.03 <0.031 ng/mL    Comment:        NO INDICATION OF MYOCARDIAL INJURY.   CBC     Status: None   Collection Time: 03/07/15  9:52 AM  Result Value Ref Range   WBC 9.9 3.6 - 11.0 K/uL   RBC 4.08 3.80 - 5.20 MIL/uL   Hemoglobin 12.5 12.0 - 16.0 g/dL   HCT 38.4 35.0 - 47.0 %   MCV 94.2 80.0 - 100.0 fL   MCH 30.7 26.0 - 34.0 pg   MCHC 32.6 32.0 - 36.0 g/dL   RDW 13.8 11.5 - 14.5 %   Platelets 237 150 - 440 K/uL  POCT Urinalysis Dipstick     Status: Abnormal   Collection Time: 03/28/15 10:10 AM  Result Value Ref Range   Color, UA  yellow    Clarity, UA clear    Glucose, UA negative    Bilirubin, UA negative    Ketones, UA negative    Spec Grav, UA 1.010    Blood, UA trace    pH, UA 5.0    Protein, UA negative    Urobilinogen, UA negative    Nitrite, UA negative    Leukocytes, UA Trace (A) Negative     Assessment & Plan  1. Urinary tract infection with hematuria, site unspecified  - POCT Urinalysis Dipstick - Urine Culture  2. Acute on chronic respiratory failure with hypoxemia (HCC)   3. Persistent atrial fibrillation ([place])   4. Diverticulitis of large intestine without perforation or abscess without bleeding  - ciprofloxacin (CIPRO) 500 MG tablet; Take 1 tablet (500 mg total) by mouth 2 (two) times daily.  Dispense: 20 tablet; Refill: 0 - metroNIDAZOLE (FLAGYL) 500 MG tablet; Take 1 tablet (500 mg total) by mouth 3 (three) times daily.  Dispense: 21 tablet; Refill: 0

## 2015-03-29 ENCOUNTER — Telehealth: Payer: Self-pay | Admitting: *Deleted

## 2015-03-29 DIAGNOSIS — Z79891 Long term (current) use of opiate analgesic: Secondary | ICD-10-CM | POA: Diagnosis not present

## 2015-03-29 DIAGNOSIS — Z9981 Dependence on supplemental oxygen: Secondary | ICD-10-CM | POA: Diagnosis not present

## 2015-03-29 DIAGNOSIS — J441 Chronic obstructive pulmonary disease with (acute) exacerbation: Secondary | ICD-10-CM | POA: Diagnosis not present

## 2015-03-29 DIAGNOSIS — Z7901 Long term (current) use of anticoagulants: Secondary | ICD-10-CM | POA: Diagnosis not present

## 2015-03-29 DIAGNOSIS — J440 Chronic obstructive pulmonary disease with acute lower respiratory infection: Secondary | ICD-10-CM | POA: Diagnosis not present

## 2015-03-29 DIAGNOSIS — I4891 Unspecified atrial fibrillation: Secondary | ICD-10-CM | POA: Diagnosis not present

## 2015-03-29 DIAGNOSIS — I739 Peripheral vascular disease, unspecified: Secondary | ICD-10-CM | POA: Diagnosis not present

## 2015-03-29 NOTE — Telephone Encounter (Signed)
Be sure that she is eating with each dose of Flagyl.  Hopefully the SOB will improvde over time.  Let me know if not better or if leg swelling worse with it.-jh

## 2015-03-29 NOTE — Telephone Encounter (Signed)
Patient c/o increased sob after taking Flagyl. She is take Cipro bid and Flagyl tid. She feels more sob after lunch time dose of Flagyl.

## 2015-03-30 LAB — URINE CULTURE

## 2015-03-30 NOTE — Telephone Encounter (Signed)
Patient aware of recommendations.  

## 2015-03-31 ENCOUNTER — Telehealth: Payer: Self-pay | Admitting: Family Medicine

## 2015-03-31 ENCOUNTER — Encounter: Payer: Self-pay | Admitting: Cardiovascular Disease

## 2015-03-31 ENCOUNTER — Ambulatory Visit (INDEPENDENT_AMBULATORY_CARE_PROVIDER_SITE_OTHER): Payer: Medicare Other | Admitting: Cardiovascular Disease

## 2015-03-31 VITALS — BP 122/62 | HR 74 | Ht 66.0 in | Wt 156.5 lb

## 2015-03-31 DIAGNOSIS — J440 Chronic obstructive pulmonary disease with acute lower respiratory infection: Secondary | ICD-10-CM | POA: Diagnosis not present

## 2015-03-31 DIAGNOSIS — J441 Chronic obstructive pulmonary disease with (acute) exacerbation: Secondary | ICD-10-CM | POA: Diagnosis not present

## 2015-03-31 DIAGNOSIS — I739 Peripheral vascular disease, unspecified: Secondary | ICD-10-CM

## 2015-03-31 DIAGNOSIS — I10 Essential (primary) hypertension: Secondary | ICD-10-CM | POA: Diagnosis not present

## 2015-03-31 DIAGNOSIS — I5022 Chronic systolic (congestive) heart failure: Secondary | ICD-10-CM | POA: Diagnosis not present

## 2015-03-31 DIAGNOSIS — Z7901 Long term (current) use of anticoagulants: Secondary | ICD-10-CM | POA: Diagnosis not present

## 2015-03-31 DIAGNOSIS — Z9981 Dependence on supplemental oxygen: Secondary | ICD-10-CM | POA: Diagnosis not present

## 2015-03-31 DIAGNOSIS — I4819 Other persistent atrial fibrillation: Secondary | ICD-10-CM

## 2015-03-31 DIAGNOSIS — Z79891 Long term (current) use of opiate analgesic: Secondary | ICD-10-CM | POA: Diagnosis not present

## 2015-03-31 DIAGNOSIS — I481 Persistent atrial fibrillation: Secondary | ICD-10-CM | POA: Diagnosis not present

## 2015-03-31 DIAGNOSIS — I4891 Unspecified atrial fibrillation: Secondary | ICD-10-CM | POA: Diagnosis not present

## 2015-03-31 NOTE — Telephone Encounter (Signed)
Agree to extension from [facility] for this patient as requested.  Place appropriate order please.-jh

## 2015-03-31 NOTE — Telephone Encounter (Signed)
Agreed [employer]

## 2015-03-31 NOTE — Assessment & Plan Note (Signed)
She has chronic atrial fibrillation treated with rate control. She is tolerating anticoagulation with no side effects. She is going to undergo endoscopy with possible esophageal dilatation. I asked her to hold Xarelto 2 days before the procedure.

## 2015-03-31 NOTE — Assessment & Plan Note (Signed)
Followed by vascular surgery. 

## 2015-03-31 NOTE — Assessment & Plan Note (Signed)
Continue current dose of carvedilol 6.25 mg twice daily. She appears to be euvolemic on current dose of Lasix 40 mg twice daily.

## 2015-03-31 NOTE — Patient Instructions (Signed)
Medication Instructions: Continue same medications.   Labwork: None.   Procedures/Testing: None.   Follow-Up:  6 months with Dr. Fletcher Anon.   Any Additional Special Instructions Will Be Listed Below (If Applicable).  Hold Xarelto 2 days before endoscopy and resume 1 day after .

## 2015-03-31 NOTE — Telephone Encounter (Signed)
Raquel Sarna with [facility] would like an extension on visits for pt.  She would like 2 visits for 2 weeks and then 1 visit for 1 week.  Please call [phone]

## 2015-03-31 NOTE — Assessment & Plan Note (Signed)
Blood pressure is controlled on current dose of carvedilol.

## 2015-03-31 NOTE — Progress Notes (Signed)
Primary care physician: Dr. Luan Pulling  HPI  This is a pleasant 81 -year-old female who is here today for a followup visit for chronic systolic heart failure due to recurrent stress-induced cardiomyopathy and chronic atrial fibrillation. Previous cardiac catheterization in 2014 showed mild nonobstructive coronary artery disease. She also has known history of peripheral arterial disease. She underwent left SFA stenting and left popliteal angioplasty in August of this year by Dr. Ronalee Belts. She had some low blood pressure readings recently and the dose of carvedilol was decreased to 3.125 mg twice daily. However, she felt significantly worse on the dose with worsening palpitations and chest pain. Thus, she resumed a 6.25 mg dose and improved. She is currently taking furosemide 40 mg twice daily. Renal function has been stable.   Allergies  Allergen Reactions  . Penicillins Itching, Swelling, Rash and Other (See Comments)    Pt is unable to answer additional questions about this medication because it happened so long ago.  . Sulfa Antibiotics Other (See Comments)    Reaction:  Unknown      Current Outpatient Prescriptions on File Prior to Visit  Medication Sig Dispense Refill  . albuterol (PROVENTIL HFA;VENTOLIN HFA) 108 (90 BASE) MCG/ACT inhaler Inhale 2 puffs into the lungs every 4 (four) hours as needed for wheezing or shortness of breath.    [name] albuterol (PROVENTIL) (2.5 MG/3ML) 0.083% nebulizer solution Take 2.5 mg by nebulization 4 (four) times daily as needed for wheezing or shortness of breath.    . ciprofloxacin (CIPRO) 500 MG tablet Take 1 tablet (500 mg total) by mouth 2 (two) times daily. 20 tablet 0  . Fluticasone-Salmeterol (ADVAIR) 250-50 MCG/DOSE AEPB Inhale 1 puff into the lungs 2 (two) times daily.    . furosemide (LASIX) 40 MG tablet Take 80 mg by mouth 2 (two) times daily.    [name] gabapentin (NEURONTIN) 100 MG capsule TAKE 1 CAPSULE (100 MG TOTAL) BY MOUTH 3 (THREE) TIMES DAILY.  3   . loratadine (CLARITIN) 10 MG tablet Take 10 mg by mouth daily.    . metroNIDAZOLE (FLAGYL) 500 MG tablet Take 1 tablet (500 mg total) by mouth 3 (three) times daily. 21 tablet 0  . omeprazole (PRILOSEC) 20 MG capsule Take 1 capsule (20 mg total) by mouth daily. 30 capsule 11  . pyridOXINE (VITAMIN B-6) 100 MG tablet Take 100 mg by mouth daily.    . ranitidine (ZANTAC) 150 MG capsule TAKE 1 CAPSULE BY MOUTH EVERY NIGHT  6  . Rivaroxaban (XARELTO) 15 MG TABS tablet Take 15 mg by mouth at bedtime.    [name] tiotropium (SPIRIVA) 18 MCG inhalation capsule Place 18 mcg into inhaler and inhale daily.    . vitamin B-12 (CYANOCOBALAMIN) 1000 MCG tablet Take 1,000 mcg by mouth daily.     No current facility-administered medications on file prior to visit.     Past Medical History  Diagnosis Date  . HTN (hypertension)   . COPD (chronic obstructive pulmonary disease) (HCC)     a. on home O2 nightly.  . Diverticulosis   . Takotsubo cardiomyopathy 10/2012    a. 10/2012: presented w/ cardiogenic shock/systolic CHF/VDRF - EF 24% by cath, 40% by f/u echo - cath with mild nonobstructive CAD. b. Not discharged on ACEI due to hypotension.  . Cardiogenic shock ([place])     a. 10/2012 - due to Takotsubo CM.  [name] Respiratory failure ([place])     a. On home O2 nightly. b. VDRF 10/2012 - due to CAP/pulm edema  in setting of cardiogenic shock/Takotsubo CM.  [name] CAP (community acquired pneumonia)     a. 10/2012 - will need f/u CXR in June 2014 to ensure clearance.  . Esophageal stricture     a. s/p dilitation 10/2012.  [name] Pleural effusion, right     a. s/p thoracentesis 10/28/12.  . Atrial fibrillation ([place])     a. Dx 10/2012, rate controlled, started on Xarelto.  . Claudication ([place])     a. ABI 10/2012 - R 0.87, L 0.74.  [name] GERD (gastroesophageal reflux disease)   . CKD (chronic kidney disease), stage III   . Breast CA (HCC)     hx  . Abdominal hernia     chronic  . STEMI (ST elevation myocardial infarction) ([place])   .  Cellulitis   . CHF (congestive heart failure) ([place])   . CAD (coronary artery disease)     a. 10/2012 - presented w/ STEMI felt due to Takotsubo - mild nonobstructive dz by cath.     Past Surgical History  Procedure Laterality Date  . Mastectomy    . Cataract extraction    . Esophagogastroduodenoscopy N/A 11/05/2012    Procedure: ESOPHAGOGASTRODUODENOSCOPY (EGD) with esophageal savary dilatation;  Surgeon: Missy Sabins, MD;  Location: [facility];  Service: Endoscopy;  Laterality: N/A;  savary dil...no floro needed  . Bladder surgery    . Left heart catheterization with coronary angiogram N/A 10/24/2012    Procedure: LEFT HEART CATHETERIZATION WITH CORONARY ANGIOGRAM;  Surgeon: Sherren Mocha, MD;  Location: [facility] CATH LAB;  Service: Cardiovascular;  Laterality: N/A;  . Peripheral vascular catheterization N/A 02/07/2015    Procedure: Abdominal Aortogram w/Lower Extremity;  Surgeon: Katha Cabal, MD;  Location: [facility] CV LAB;  Service: Cardiovascular;  Laterality: N/A;  . Peripheral vascular catheterization  02/07/2015    Procedure: Lower Extremity Intervention;  Surgeon: Katha Cabal, MD;  Location: [facility] CV LAB;  Service: Cardiovascular;;  . Coronary angioplasty       Family History  Problem Relation Age of Onset  . Heart attack Mother      Social History   Social History  . Marital Status: Widowed    Spouse Name: N/A  . Number of Children: 1  . Years of Education: N/A   Occupational History  . Not on file.   Social History Main Topics  . Smoking status: Former Smoker -- 1.00 packs/day for 55 years    Types: Cigarettes  . Smokeless tobacco: Former Systems developer  . Alcohol Use: No  . Drug Use: No  . Sexual Activity: No   Other Topics Concern  . Not on file   Social History Narrative     PHYSICAL EXAM   BP 122/62 mmHg  Pulse 74  Ht 5\' 6"  (1.676 m)  Wt 156 lb 8 oz (70.988 kg)  BMI 25.27 kg/m2 Constitutional: She is oriented to person, place, and  time. She appears well-developed and well-nourished. No distress.  HENT: No nasal discharge.  Head: Normocephalic and atraumatic.  Eyes: Pupils are equal and round.  Neck: Normal range of motion. Neck supple. No JVD present. No thyromegaly present.  Cardiovascular: Normal rate, irregular rhythm, normal heart sounds. Exam reveals no gallop and no friction rub. No murmur heard.  Pulmonary/Chest: Effort normal . Very few crackles at the base. No stridor. No respiratory distress. She has no wheezes. She has no rales. She exhibits no tenderness.  Abdominal: Soft. Bowel sounds are normal. She exhibits no distension. There is no tenderness.  There is no rebound and no guarding.  Musculoskeletal: Normal range of motion. She exhibits trace edema and no tenderness.  Neurological: She is alert and oriented to person, place, and time. Coordination normal.  Skin: There is a small wound on the right shin which has healed nicely. No induration or warmth.  Psychiatric: She has a normal mood and affect. Her behavior is normal. Judgment and thought content normal.     EKG:  Atrial fibrillation  ABNORMAL RHYTHM   ASSESSMENT AND PLAN

## 2015-04-03 NOTE — Telephone Encounter (Signed)
[facility] notified of extension.[place]

## 2015-04-04 DIAGNOSIS — J441 Chronic obstructive pulmonary disease with (acute) exacerbation: Secondary | ICD-10-CM | POA: Diagnosis not present

## 2015-04-04 DIAGNOSIS — Z9981 Dependence on supplemental oxygen: Secondary | ICD-10-CM | POA: Diagnosis not present

## 2015-04-04 DIAGNOSIS — I4891 Unspecified atrial fibrillation: Secondary | ICD-10-CM | POA: Diagnosis not present

## 2015-04-04 DIAGNOSIS — Z79891 Long term (current) use of opiate analgesic: Secondary | ICD-10-CM | POA: Diagnosis not present

## 2015-04-04 DIAGNOSIS — I739 Peripheral vascular disease, unspecified: Secondary | ICD-10-CM | POA: Diagnosis not present

## 2015-04-04 DIAGNOSIS — J440 Chronic obstructive pulmonary disease with acute lower respiratory infection: Secondary | ICD-10-CM | POA: Diagnosis not present

## 2015-04-04 DIAGNOSIS — Z7901 Long term (current) use of anticoagulants: Secondary | ICD-10-CM | POA: Diagnosis not present

## 2015-04-06 ENCOUNTER — Telehealth: Payer: Self-pay | Admitting: Family Medicine

## 2015-04-06 ENCOUNTER — Telehealth: Payer: Self-pay

## 2015-04-06 DIAGNOSIS — I4891 Unspecified atrial fibrillation: Secondary | ICD-10-CM | POA: Diagnosis not present

## 2015-04-06 DIAGNOSIS — I739 Peripheral vascular disease, unspecified: Secondary | ICD-10-CM | POA: Diagnosis not present

## 2015-04-06 DIAGNOSIS — J440 Chronic obstructive pulmonary disease with acute lower respiratory infection: Secondary | ICD-10-CM | POA: Diagnosis not present

## 2015-04-06 DIAGNOSIS — Z9981 Dependence on supplemental oxygen: Secondary | ICD-10-CM | POA: Diagnosis not present

## 2015-04-06 DIAGNOSIS — J441 Chronic obstructive pulmonary disease with (acute) exacerbation: Secondary | ICD-10-CM | POA: Diagnosis not present

## 2015-04-06 DIAGNOSIS — Z7901 Long term (current) use of anticoagulants: Secondary | ICD-10-CM | POA: Diagnosis not present

## 2015-04-06 DIAGNOSIS — Z79891 Long term (current) use of opiate analgesic: Secondary | ICD-10-CM | POA: Diagnosis not present

## 2015-04-06 NOTE — Telephone Encounter (Signed)
Nurse with [facility]. States pt has had bad night. A lot of SOB, crackles and wheezed in lungs. States she measured pt waist, and was a couple cm larger. Today waist measured 107 cm , measurements earlier in the week was 103. Please call.

## 2015-04-06 NOTE — Telephone Encounter (Signed)
[name] [name] from [facility] said pt had a bad night last night so she called cardiologist.  She asked if Dr. Luan Pulling would increased pt visits to 2 a week for 2 weeks and then once a week for there remainder of cert period.  Her call back number is [phone]

## 2015-04-06 NOTE — Telephone Encounter (Signed)
Pt has been using her albuterol frequently and she ran out now.  Her insurance will not pay until Nov 6th.  She said that is the only thing that helps her breathing.  Her call back number is [phone]

## 2015-04-06 NOTE — Telephone Encounter (Signed)
S/w [place] who states when she arrived at pt home today, pt seemed more SOB than when she last saw her on Tuesday. States she heard crackles and wheezing, and noticed increased abdominal girth. Pt does not weigh herself therefore, unable to determine any weight gain. Judeen Hammans states pt takes lasix 80mg  BID. Informed her prescription states 40mg  BID. Pt took 80mg  BID while in hospital in September. At 10/21 OV w/[facility], it was reported pt takes 40mg  BID. No refills on lasix.  S/w pt who states she is SOB but is able to carry on conversation well.  Uses oxygen at 2.5L Pt uses inhaler. Denies weight gain. States last time she weighed at home, she was 158-160lbs. Weighed 156 at 10/21 OV. Reports slight swelling in her left ankle. Pt states lasix prescription from Dr. Luan Pulling is 80mg  BID. She feels this is too much. Informed pt I would forward to Dr. Fletcher Anon and call back w/recommendations. Pt lives alone. Advised her to call 911 if symptoms worsen.

## 2015-04-06 NOTE — Telephone Encounter (Signed)
Called patient to get more information. She is out of albuterol for nebulizer and ins will not pay until 04/16/2015. Patient will be given some albuterol in house.

## 2015-04-07 ENCOUNTER — Telehealth: Payer: Self-pay

## 2015-04-07 NOTE — Telephone Encounter (Signed)
Dr. Luan Pulling has agreed with plan of care to extend visits as requested. [facility] nurse has been notified.

## 2015-04-07 NOTE — Telephone Encounter (Signed)
Lasix should be 40 mg bid.

## 2015-04-07 NOTE — Telephone Encounter (Signed)
Ok, as discussed-jh

## 2015-04-07 NOTE — Telephone Encounter (Signed)
Ok, as discussed.-jh

## 2015-04-07 NOTE — Telephone Encounter (Signed)
S/w pt who states she feels much better today. Reports sleeping better, not SOB as before. Reviewed Dr. Tyrell Antonio recommendations of lasix 40mg  BID. Pt agreeable w/plan and verbalized understanding.

## 2015-04-10 ENCOUNTER — Encounter: Payer: Self-pay | Admitting: *Deleted

## 2015-04-10 ENCOUNTER — Emergency Department: Payer: Medicare Other

## 2015-04-10 ENCOUNTER — Inpatient Hospital Stay
Admission: EM | Admit: 2015-04-10 | Discharge: 2015-04-14 | DRG: 291 | Disposition: A | Discharge status: Skilled Nursing Facility | Payer: Medicare Other | Attending: Internal Medicine | Admitting: Internal Medicine

## 2015-04-10 ENCOUNTER — Telehealth: Payer: Self-pay

## 2015-04-10 DIAGNOSIS — I739 Peripheral vascular disease, unspecified: Secondary | ICD-10-CM | POA: Diagnosis present

## 2015-04-10 DIAGNOSIS — I252 Old myocardial infarction: Secondary | ICD-10-CM | POA: Diagnosis not present

## 2015-04-10 DIAGNOSIS — J441 Chronic obstructive pulmonary disease with (acute) exacerbation: Secondary | ICD-10-CM | POA: Diagnosis not present

## 2015-04-10 DIAGNOSIS — I272 Other secondary pulmonary hypertension: Secondary | ICD-10-CM | POA: Diagnosis present

## 2015-04-10 DIAGNOSIS — Z881 Allergy status to other antibiotic agents status: Secondary | ICD-10-CM | POA: Diagnosis not present

## 2015-04-10 DIAGNOSIS — R0602 Shortness of breath: Secondary | ICD-10-CM

## 2015-04-10 DIAGNOSIS — J9621 Acute and chronic respiratory failure with hypoxia: Secondary | ICD-10-CM | POA: Diagnosis present

## 2015-04-10 DIAGNOSIS — I5033 Acute on chronic diastolic (congestive) heart failure: Secondary | ICD-10-CM | POA: Diagnosis not present

## 2015-04-10 DIAGNOSIS — K5790 Diverticulosis of intestine, part unspecified, without perforation or abscess without bleeding: Secondary | ICD-10-CM | POA: Diagnosis not present

## 2015-04-10 DIAGNOSIS — Z87891 Personal history of nicotine dependence: Secondary | ICD-10-CM

## 2015-04-10 DIAGNOSIS — I5181 Takotsubo syndrome: Secondary | ICD-10-CM | POA: Diagnosis present

## 2015-04-10 DIAGNOSIS — I5022 Chronic systolic (congestive) heart failure: Secondary | ICD-10-CM

## 2015-04-10 DIAGNOSIS — R1310 Dysphagia, unspecified: Secondary | ICD-10-CM | POA: Diagnosis not present

## 2015-04-10 DIAGNOSIS — J440 Chronic obstructive pulmonary disease with acute lower respiratory infection: Secondary | ICD-10-CM | POA: Diagnosis not present

## 2015-04-10 DIAGNOSIS — Z882 Allergy status to sulfonamides status: Secondary | ICD-10-CM

## 2015-04-10 DIAGNOSIS — Z79891 Long term (current) use of opiate analgesic: Secondary | ICD-10-CM | POA: Diagnosis not present

## 2015-04-10 DIAGNOSIS — Z7901 Long term (current) use of anticoagulants: Secondary | ICD-10-CM | POA: Diagnosis not present

## 2015-04-10 DIAGNOSIS — I509 Heart failure, unspecified: Secondary | ICD-10-CM | POA: Diagnosis not present

## 2015-04-10 DIAGNOSIS — J9690 Respiratory failure, unspecified, unspecified whether with hypoxia or hypercapnia: Secondary | ICD-10-CM | POA: Diagnosis not present

## 2015-04-10 DIAGNOSIS — E876 Hypokalemia: Secondary | ICD-10-CM | POA: Diagnosis not present

## 2015-04-10 DIAGNOSIS — K219 Gastro-esophageal reflux disease without esophagitis: Secondary | ICD-10-CM | POA: Diagnosis not present

## 2015-04-10 DIAGNOSIS — Z853 Personal history of malignant neoplasm of breast: Secondary | ICD-10-CM

## 2015-04-10 DIAGNOSIS — J9811 Atelectasis: Secondary | ICD-10-CM | POA: Diagnosis present

## 2015-04-10 DIAGNOSIS — Z9981 Dependence on supplemental oxygen: Secondary | ICD-10-CM | POA: Diagnosis not present

## 2015-04-10 DIAGNOSIS — I2510 Atherosclerotic heart disease of native coronary artery without angina pectoris: Secondary | ICD-10-CM | POA: Diagnosis present

## 2015-04-10 DIAGNOSIS — Z79899 Other long term (current) drug therapy: Secondary | ICD-10-CM

## 2015-04-10 DIAGNOSIS — M6281 Muscle weakness (generalized): Secondary | ICD-10-CM | POA: Diagnosis not present

## 2015-04-10 DIAGNOSIS — N183 Chronic kidney disease, stage 3 (moderate): Secondary | ICD-10-CM | POA: Diagnosis not present

## 2015-04-10 DIAGNOSIS — J449 Chronic obstructive pulmonary disease, unspecified: Secondary | ICD-10-CM | POA: Diagnosis not present

## 2015-04-10 DIAGNOSIS — I5043 Acute on chronic combined systolic (congestive) and diastolic (congestive) heart failure: Secondary | ICD-10-CM | POA: Diagnosis present

## 2015-04-10 DIAGNOSIS — J9691 Respiratory failure, unspecified with hypoxia: Secondary | ICD-10-CM | POA: Diagnosis present

## 2015-04-10 DIAGNOSIS — I5021 Acute systolic (congestive) heart failure: Secondary | ICD-10-CM

## 2015-04-10 DIAGNOSIS — Z5189 Encounter for other specified aftercare: Secondary | ICD-10-CM | POA: Diagnosis not present

## 2015-04-10 DIAGNOSIS — J189 Pneumonia, unspecified organism: Secondary | ICD-10-CM

## 2015-04-10 DIAGNOSIS — Y95 Nosocomial condition: Secondary | ICD-10-CM | POA: Diagnosis present

## 2015-04-10 DIAGNOSIS — Z880 Allergy status to penicillin: Secondary | ICD-10-CM

## 2015-04-10 DIAGNOSIS — Z888 Allergy status to other drugs, medicaments and biological substances status: Secondary | ICD-10-CM | POA: Diagnosis not present

## 2015-04-10 DIAGNOSIS — I130 Hypertensive heart and chronic kidney disease with heart failure and stage 1 through stage 4 chronic kidney disease, or unspecified chronic kidney disease: Principal | ICD-10-CM | POA: Diagnosis present

## 2015-04-10 DIAGNOSIS — I10 Essential (primary) hypertension: Secondary | ICD-10-CM | POA: Diagnosis not present

## 2015-04-10 DIAGNOSIS — I4891 Unspecified atrial fibrillation: Secondary | ICD-10-CM | POA: Diagnosis present

## 2015-04-10 DIAGNOSIS — J180 Bronchopneumonia, unspecified organism: Secondary | ICD-10-CM | POA: Diagnosis not present

## 2015-04-10 DIAGNOSIS — I482 Chronic atrial fibrillation: Secondary | ICD-10-CM | POA: Diagnosis present

## 2015-04-10 DIAGNOSIS — I5023 Acute on chronic systolic (congestive) heart failure: Secondary | ICD-10-CM | POA: Diagnosis not present

## 2015-04-10 LAB — URINALYSIS COMPLETE WITH MICROSCOPIC (ARMC ONLY)
BILIRUBIN URINE: NEGATIVE
Bacteria, UA: NONE SEEN
Glucose, UA: 50 mg/dL — AB
KETONES UR: NEGATIVE mg/dL
NITRITE: NEGATIVE
PH: 8 (ref 5.0–8.0)
PROTEIN: NEGATIVE mg/dL
SPECIFIC GRAVITY, URINE: 1.004 — AB (ref 1.005–1.030)

## 2015-04-10 LAB — BASIC METABOLIC PANEL
ANION GAP: 9 (ref 5–15)
BUN: 17 mg/dL (ref 6–20)
CO2: 40 mmol/L — AB (ref 22–32)
Calcium: 9 mg/dL (ref 8.9–10.3)
Chloride: 92 mmol/L — ABNORMAL LOW (ref 101–111)
Creatinine, Ser: 1.04 mg/dL — ABNORMAL HIGH (ref 0.44–1.00)
GFR calc Af Amer: 53 mL/min — ABNORMAL LOW (ref >60)
GFR, EST NON AFRICAN AMERICAN: 46 mL/min — AB (ref >60)
GLUCOSE: 143 mg/dL — AB (ref 65–99)
POTASSIUM: 3.2 mmol/L — AB (ref 3.5–5.1)
Sodium: 141 mmol/L (ref 135–145)

## 2015-04-10 LAB — CBC WITH DIFFERENTIAL/PLATELET
BASOS ABS: 0.1 10*3/uL (ref 0–0.1)
Basophils Relative: 1 %
EOS PCT: 1 %
Eosinophils Absolute: 0.1 10*3/uL (ref 0–0.7)
HEMATOCRIT: 34.9 % — AB (ref 35.0–47.0)
Hemoglobin: 11.4 g/dL — ABNORMAL LOW (ref 12.0–16.0)
LYMPHS ABS: 2.1 10*3/uL (ref 1.0–3.6)
LYMPHS PCT: 30 %
MCH: 30.9 pg (ref 26.0–34.0)
MCHC: 32.8 g/dL (ref 32.0–36.0)
MCV: 94.2 fL (ref 80.0–100.0)
MONO ABS: 0.6 10*3/uL (ref 0.2–0.9)
Monocytes Relative: 9 %
NEUTROS ABS: 4.2 10*3/uL (ref 1.4–6.5)
Neutrophils Relative %: 59 %
PLATELETS: 240 10*3/uL (ref 150–440)
RBC: 3.7 MIL/uL — AB (ref 3.80–5.20)
RDW: 14.8 % — AB (ref 11.5–14.5)
WBC: 7.1 10*3/uL (ref 3.6–11.0)

## 2015-04-10 LAB — TROPONIN I: Troponin I: <0.03 ng/mL (ref <0.031)

## 2015-04-10 LAB — BRAIN NATRIURETIC PEPTIDE: B Natriuretic Peptide: 185 pg/mL — ABNORMAL HIGH (ref 0.0–100.0)

## 2015-04-10 MED ORDER — IPRATROPIUM-ALBUTEROL 0.5-2.5 (3) MG/3ML IN SOLN
6.0000 mL | Freq: Once | RESPIRATORY_TRACT | Status: AC
  Administered 2015-04-10: 6 mL via RESPIRATORY_TRACT
  Filled 2015-04-10: qty 6

## 2015-04-10 MED ORDER — FUROSEMIDE 40 MG PO TABS
40.0000 mg | ORAL_TABLET | Freq: Two times a day (BID) | ORAL | Status: DC
  Administered 2015-04-11 – 2015-04-14 (×6): 40 mg via ORAL
  Filled 2015-04-10 (×8): qty 1

## 2015-04-10 MED ORDER — TIOTROPIUM BROMIDE MONOHYDRATE 18 MCG IN CAPS
18.0000 ug | ORAL_CAPSULE | Freq: Every day | RESPIRATORY_TRACT | Status: DC
  Administered 2015-04-11 – 2015-04-14 (×5): 18 ug via RESPIRATORY_TRACT
  Filled 2015-04-10: qty 5

## 2015-04-10 MED ORDER — ACETAMINOPHEN 325 MG PO TABS
650.0000 mg | ORAL_TABLET | Freq: Four times a day (QID) | ORAL | Status: DC | PRN
  Filled 2015-04-10: qty 2

## 2015-04-10 MED ORDER — IPRATROPIUM-ALBUTEROL 0.5-2.5 (3) MG/3ML IN SOLN
3.0000 mL | Freq: Four times a day (QID) | RESPIRATORY_TRACT | Status: DC
  Administered 2015-04-10 – 2015-04-11 (×4): 3 mL via RESPIRATORY_TRACT
  Filled 2015-04-10 (×4): qty 3

## 2015-04-10 MED ORDER — ALUM & MAG HYDROXIDE-SIMETH 200-200-20 MG/5ML PO SUSP: 30.0000 mL | Freq: Four times a day (QID) | ORAL | Status: DC | PRN

## 2015-04-10 MED ORDER — VITAMIN B-12 1000 MCG PO TABS
1000.0000 ug | ORAL_TABLET | Freq: Every day | ORAL | Status: DC
  Administered 2015-04-11 – 2015-04-14 (×4): 1000 ug via ORAL
  Filled 2015-04-10 (×4): qty 1

## 2015-04-10 MED ORDER — GABAPENTIN 300 MG PO CAPS
300.0000 mg | ORAL_CAPSULE | Freq: Three times a day (TID) | ORAL | Status: DC
  Administered 2015-04-10 – 2015-04-12 (×7): 300 mg via ORAL
  Filled 2015-04-10 (×8): qty 1

## 2015-04-10 MED ORDER — ONDANSETRON HCL 4 MG PO TABS
4.0000 mg | ORAL_TABLET | Freq: Four times a day (QID) | ORAL | Status: DC | PRN
Start: 2015-04-10 — End: 2015-04-14

## 2015-04-10 MED ORDER — RIVAROXABAN 15 MG PO TABS
15.0000 mg | ORAL_TABLET | Freq: Every day | ORAL | Status: DC
  Administered 2015-04-11 – 2015-04-13 (×3): 15 mg via ORAL
  Filled 2015-04-10 (×3): qty 1

## 2015-04-10 MED ORDER — ONDANSETRON HCL 4 MG/2ML IJ SOLN: 4.0000 mg | Freq: Four times a day (QID) | INTRAMUSCULAR | Status: DC | PRN

## 2015-04-10 MED ORDER — DEXTROSE 5 % IV SOLN: 500.0000 mg | INTRAVENOUS | Status: DC

## 2015-04-10 MED ORDER — MOMETASONE FURO-FORMOTEROL FUM 100-5 MCG/ACT IN AERO
2.0000 | INHALATION_SPRAY | Freq: Two times a day (BID) | RESPIRATORY_TRACT | Status: DC
  Administered 2015-04-10 – 2015-04-12 (×4): 2 via RESPIRATORY_TRACT
  Filled 2015-04-10 (×2): qty 8.8

## 2015-04-10 MED ORDER — VITAMIN B-6 100 MG PO TABS
100.0000 mg | ORAL_TABLET | Freq: Every day | ORAL | Status: DC
  Administered 2015-04-11: 100 mg via ORAL
  Administered 2015-04-12: 50 mg via ORAL
  Administered 2015-04-13 – 2015-04-14 (×2): 100 mg via ORAL
  Filled 2015-04-10 (×5): qty 1

## 2015-04-10 MED ORDER — TRAZODONE HCL 50 MG PO TABS
25.0000 mg | ORAL_TABLET | Freq: Every evening | ORAL | Status: DC | PRN
  Administered 2015-04-14: 25 mg via ORAL
  Filled 2015-04-10: qty 1

## 2015-04-10 MED ORDER — LORATADINE 10 MG PO TABS
10.0000 mg | ORAL_TABLET | Freq: Every day | ORAL | Status: DC
  Administered 2015-04-11 – 2015-04-14 (×4): 10 mg via ORAL
  Filled 2015-04-10 (×4): qty 1

## 2015-04-10 MED ORDER — ALBUTEROL SULFATE HFA 108 (90 BASE) MCG/ACT IN AERS: 2.0000 | INHALATION_SPRAY | RESPIRATORY_TRACT | Status: DC | PRN

## 2015-04-10 MED ORDER — DEXTROSE 5 % IV SOLN
2.0000 g | Freq: Once | INTRAVENOUS | Status: DC
  Administered 2015-04-10: 2 g via INTRAVENOUS
  Filled 2015-04-10: qty 2

## 2015-04-10 MED ORDER — ALBUTEROL SULFATE (2.5 MG/3ML) 0.083% IN NEBU
2.5000 mg | INHALATION_SOLUTION | Freq: Four times a day (QID) | RESPIRATORY_TRACT | Status: DC | PRN
  Administered 2015-04-13: 2.5 mg via RESPIRATORY_TRACT
  Filled 2015-04-10: qty 3

## 2015-04-10 MED ORDER — VANCOMYCIN HCL IN DEXTROSE 1-5 GM/200ML-% IV SOLN: 1000.0000 mg | Freq: Once | INTRAVENOUS | Status: DC

## 2015-04-10 MED ORDER — BISACODYL 5 MG PO TBEC
5.0000 mg | DELAYED_RELEASE_TABLET | Freq: Every day | ORAL | Status: DC | PRN
  Administered 2015-04-12: 5 mg via ORAL
  Filled 2015-04-10: qty 1

## 2015-04-10 MED ORDER — DOCUSATE SODIUM 100 MG PO CAPS
100.0000 mg | ORAL_CAPSULE | Freq: Two times a day (BID) | ORAL | Status: DC
  Administered 2015-04-10 – 2015-04-14 (×8): 100 mg via ORAL
  Filled 2015-04-10 (×8): qty 1

## 2015-04-10 MED ORDER — POTASSIUM CHLORIDE CRYS ER 20 MEQ PO TBCR
20.0000 meq | EXTENDED_RELEASE_TABLET | Freq: Two times a day (BID) | ORAL | Status: DC
  Administered 2015-04-10 – 2015-04-14 (×7): 20 meq via ORAL
  Filled 2015-04-10 (×9): qty 1

## 2015-04-10 MED ORDER — DOXYCYCLINE HYCLATE 100 MG IV SOLR
100.0000 mg | Freq: Two times a day (BID) | INTRAVENOUS | Status: DC
  Administered 2015-04-10 – 2015-04-11 (×2): 100 mg via INTRAVENOUS
  Filled 2015-04-10 (×2): qty 100

## 2015-04-10 MED ORDER — CARVEDILOL 6.25 MG PO TABS
6.2500 mg | ORAL_TABLET | Freq: Two times a day (BID) | ORAL | Status: DC
  Administered 2015-04-11 – 2015-04-14 (×7): 6.25 mg via ORAL
  Filled 2015-04-10 (×7): qty 1

## 2015-04-10 MED ORDER — SODIUM CHLORIDE 0.9 % IJ SOLN
3.0000 mL | Freq: Two times a day (BID) | INTRAMUSCULAR | Status: DC
  Administered 2015-04-11 – 2015-04-14 (×7): 3 mL via INTRAVENOUS

## 2015-04-10 MED ORDER — ACETAMINOPHEN 650 MG RE SUPP: 650.0000 mg | Freq: Four times a day (QID) | RECTAL | Status: DC | PRN

## 2015-04-10 MED ORDER — PANTOPRAZOLE SODIUM 40 MG PO TBEC
40.0000 mg | DELAYED_RELEASE_TABLET | Freq: Every day | ORAL | Status: DC
  Administered 2015-04-11 – 2015-04-14 (×4): 40 mg via ORAL
  Filled 2015-04-10 (×4): qty 1

## 2015-04-10 NOTE — Telephone Encounter (Signed)
Ok-jh 

## 2015-04-10 NOTE — Progress Notes (Signed)
[name] [name] is a 81 y.o. female patient admitted from ED awake, alert - oriented  X 4 - no acute distress noted.  VSS - Blood pressure 129/55, pulse 78, temperature 97.5 F (36.4 C), temperature source Oral, resp. rate 24, height 5\' 7"  (1.702 m), weight 73.12 kg (161 lb 3.2 oz), SpO2 99 %.    IV in place, occlusive dsg intact without redness.  Orientation to room, and floor completed with information packet given to patient/family.  Admission INP armband ID verified with patient/family, and in place.   SR up x 2, fall assessment complete, with patient and family able to verbalize understanding of risk associated with falls, and verbalized understanding to call nsg before up out of bed.  Call light within reach, patient able to voice, and demonstrate understanding.  Skin, clean-dry- intact, some bruising is noted on bilateral lower extremities.   No evidence of skin break down noted on exam, skin assessed with Patrice Paradise.      Will cont to eval and treat per MD orders.  Horton Finer, RN 04/10/2015 6:48 PM

## 2015-04-10 NOTE — Progress Notes (Signed)
[facility]  Patient Status: active  [facility] is providing the following services: SN/PT  If patient discharges after hours, please call [phone].   [name] [name] 04/10/2015, 3:57 PM

## 2015-04-10 NOTE — H&P (Signed)
[facility] [place] NAME: [name] [name]    MR#:  [id number]  DATE OF BIRTH:  Jul 19, 1922  DATE OF ADMISSION:  04/10/2015  PRIMARY CARE PHYSICIAN: [name], MD   REQUESTING/REFERRING PHYSICIAN: Orbie Pyo, MD  CHIEF COMPLAINT:   Chief Complaint  Patient presents with  . Shortness of Breath   HISTORY OF PRESENT ILLNESS:  [name] [name]  is a 81 y.o. female with a known history of COPD and chronic systolic heart failure who is presenting with shortness of breath over the past week. She denies intermittent chest pain but says she has had left sided chest pain which says has been going on for months.Says has had a cough over the past week as well. Is usually on 2-1/2 L of nasal cannula oxygen at home but had to be elevated to 4 L because of her shortness of breath and hypoxia. Per EMS, the patient had "low" oxygen saturations at home. She is also complaining of burning with urination. In route she was given 125 mg Solu-Medrol as well as 1 DuoNeb and 1 regular albuterol neb. She has known chronic systolic heart failure due to recurrent stress-induced cardiomyopathy and chronic atrial fibrillation. Previous cardiac catheterization in 2014 showed mild nonobstructive coronary artery disease. She also has known history of peripheral arterial disease. She underwent left SFA stenting and left popliteal angioplasty in August of this year by Dr. Ronalee Belts. She is currently taking furosemide 40 mg twice daily per Dr Fletcher Anon. Has cough with white sputum but no fever.   PAST MEDICAL HISTORY:   Past Medical History  Diagnosis Date  . HTN (hypertension)   . COPD (chronic obstructive pulmonary disease) (HCC)     a. on home O2 nightly.  . Diverticulosis   . Takotsubo cardiomyopathy 10/2012    a. 10/2012: presented w/ cardiogenic shock/systolic CHF/VDRF - EF 14% by cath, 40% by f/u echo - cath with mild nonobstructive CAD. b. Not discharged on ACEI  due to hypotension.  . Cardiogenic shock ([place])     a. 10/2012 - due to Takotsubo CM.  [name] Respiratory failure ([place])     a. On home O2 nightly. b. VDRF 10/2012 - due to CAP/pulm edema in setting of cardiogenic shock/Takotsubo CM.  [name] CAP (community acquired pneumonia)     a. 10/2012 - will need f/u CXR in June 2014 to ensure clearance.  . Esophageal stricture     a. s/p dilitation 10/2012.  [name] Pleural effusion, right     a. s/p thoracentesis 10/28/12.  . Atrial fibrillation ([place])     a. Dx 10/2012, rate controlled, started on Xarelto.  . Claudication ([place])     a. ABI 10/2012 - R 0.87, L 0.74.  [name] GERD (gastroesophageal reflux disease)   . CKD (chronic kidney disease), stage III   . Breast CA (HCC)     hx  . Abdominal hernia     chronic  . STEMI (ST elevation myocardial infarction) ([place])   . Cellulitis   . CHF (congestive heart failure) ([place])   . CAD (coronary artery disease)     a. 10/2012 - presented w/ STEMI felt due to Takotsubo - mild nonobstructive dz by cath.   PAST SURGICAL HISTORY:   Past Surgical History  Procedure Laterality Date  . Mastectomy    . Cataract extraction    . Esophagogastroduodenoscopy N/A 11/05/2012    Procedure: ESOPHAGOGASTRODUODENOSCOPY (EGD) with esophageal savary dilatation;  Surgeon: Missy Sabins,  MD;  Location: [facility];  Service: Endoscopy;  Laterality: N/A;  savary dil...no floro needed  . Bladder surgery    . Left heart catheterization with coronary angiogram N/A 10/24/2012    Procedure: LEFT HEART CATHETERIZATION WITH CORONARY ANGIOGRAM;  Surgeon: Sherren Mocha, MD;  Location: [facility] CATH LAB;  Service: Cardiovascular;  Laterality: N/A;  . Peripheral vascular catheterization N/A 02/07/2015    Procedure: Abdominal Aortogram w/Lower Extremity;  Surgeon: Katha Cabal, MD;  Location: [facility] CV LAB;  Service: Cardiovascular;  Laterality: N/A;  . Peripheral vascular catheterization  02/07/2015    Procedure: Lower Extremity Intervention;  Surgeon:  Katha Cabal, MD;  Location: [facility] CV LAB;  Service: Cardiovascular;;  . Coronary angioplasty     SOCIAL HISTORY:   Social History  Substance Use Topics  . Smoking status: Former Smoker -- 1.00 packs/day for 55 years    Types: Cigarettes  . Smokeless tobacco: Former Systems developer  . Alcohol Use: No   FAMILY HISTORY:   Family History  Problem Relation Age of Onset  . Heart attack Mother    DRUG ALLERGIES:   Allergies  Allergen Reactions  . Penicillins Itching, Swelling, Rash and Other (See Comments)    Pt is unable to answer additional questions about this medication because it happened so long ago.  [name] [Levofloxacin In D5w]   . Sulfa Antibiotics Other (See Comments)    Reaction:  Unknown    REVIEW OF SYSTEMS:   Review of Systems  Constitutional: Negative for fever, weight loss, malaise/fatigue and diaphoresis.  HENT: Negative for ear discharge, ear pain, hearing loss, nosebleeds, sore throat and tinnitus.   Eyes: Negative for blurred vision and pain.  Respiratory: Positive for cough, sputum production, shortness of breath and wheezing. Negative for hemoptysis.   Cardiovascular: Negative for chest pain, palpitations, orthopnea and leg swelling.  Gastrointestinal: Negative for heartburn, nausea, vomiting, abdominal pain, diarrhea, constipation and blood in stool.  Genitourinary: Negative for dysuria, urgency and frequency.  Musculoskeletal: Negative for myalgias and back pain.  Skin: Negative for itching and rash.  Neurological: Negative for dizziness, tingling, tremors, focal weakness, seizures, weakness and headaches.  Psychiatric/Behavioral: Negative for depression. The patient is not nervous/anxious.    MEDICATIONS AT HOME:   Prior to Admission medications   Medication Sig Start Date End Date Taking? Authorizing Provider  albuterol (PROVENTIL HFA;VENTOLIN HFA) 108 (90 BASE) MCG/ACT inhaler Inhale 2 puffs into the lungs every 4 (four) hours as needed for  wheezing or shortness of breath.    Historical Provider, MD  albuterol (PROVENTIL) (2.5 MG/3ML) 0.083% nebulizer solution Take 2.5 mg by nebulization 4 (four) times daily as needed for wheezing or shortness of breath.    Historical Provider, MD  carvedilol (COREG) 6.25 MG tablet Take 6.25 mg by mouth 2 (two) times daily with a meal.    Historical Provider, MD  ciprofloxacin (CIPRO) 500 MG tablet Take 1 tablet (500 mg total) by mouth 2 (two) times daily. 03/28/15   Arlis Porta., MD  Fluticasone-Salmeterol (ADVAIR) 250-50 MCG/DOSE AEPB Inhale 1 puff into the lungs 2 (two) times daily.    Historical Provider, MD  furosemide (LASIX) 40 MG tablet Take 40 mg by mouth 2 (two) times daily.     Historical Provider, MD  gabapentin (NEURONTIN) 100 MG capsule TAKE 1 CAPSULE (100 MG TOTAL) BY MOUTH 3 (THREE) TIMES DAILY. 12/28/14   Historical Provider, MD  loratadine (CLARITIN) 10 MG tablet Take 10 mg by mouth daily.  Historical Provider, MD  metroNIDAZOLE (FLAGYL) 500 MG tablet Take 1 tablet (500 mg total) by mouth 3 (three) times daily. 03/28/15   Arlis Porta., MD  omeprazole (PRILOSEC) 20 MG capsule Take 1 capsule (20 mg total) by mouth daily. 12/21/14   Arlis Porta., MD  pyridOXINE (VITAMIN B-6) 100 MG tablet Take 100 mg by mouth daily.    Historical Provider, MD  ranitidine (ZANTAC) 150 MG capsule TAKE 1 CAPSULE BY MOUTH EVERY NIGHT 02/28/15   Historical Provider, MD  Rivaroxaban (XARELTO) 15 MG TABS tablet Take 15 mg by mouth at bedtime.    Historical Provider, MD  tiotropium (SPIRIVA) 18 MCG inhalation capsule Place 18 mcg into inhaler and inhale daily.    Historical Provider, MD  vitamin B-12 (CYANOCOBALAMIN) 1000 MCG tablet Take 1,000 mcg by mouth daily.    Historical Provider, MD      VITAL SIGNS:  Blood pressure 128/71, pulse 74, temperature 97.6 F (36.4 C), resp. rate 20, height 5\' 7"  (1.702 m), weight 73.12 kg (161 lb 3.2 oz), SpO2 100 %.  PHYSICAL EXAMINATION:  Physical  Exam  Constitutional: She is oriented to person, place, and time and well-developed, well-nourished, and in no distress.  HENT:  Head: Normocephalic and atraumatic.  Eyes: Conjunctivae and EOM are normal. Pupils are equal, round, and reactive to light.  Neck: Normal range of motion. Neck supple. No tracheal deviation present. No thyromegaly present.  Cardiovascular: Normal rate and normal heart sounds.  An irregularly irregular rhythm present.  Pulmonary/Chest: Accessory muscle usage present. Tachypnea noted. She is in respiratory distress. She has decreased breath sounds in the left lower field. She has wheezes. She has rhonchi in the left lower field. She exhibits no tenderness.  Abdominal: Soft. Bowel sounds are normal. She exhibits no distension. There is no tenderness.  Musculoskeletal: Normal range of motion.  Neurological: She is alert and oriented to person, place, and time. No cranial nerve deficit.  Skin: Skin is warm and dry. Bruising, ecchymosis and lesion noted. No rash noted.     Psychiatric: Mood and affect normal.   LABORATORY PANEL:   CBC  Recent Labs Lab 04/10/15 1409  WBC 7.1  HGB 11.4*  HCT 34.9*  PLT 240   ------------------------------------------------------------------------------------------------------------------  Chemistries   Recent Labs Lab 04/10/15 1409  NA 141  K 3.2*  CL 92*  CO2 40*  GLUCOSE 143*  BUN 17  CREATININE 1.04*  CALCIUM 9.0   ------------------------------------------------------------------------------------------------------------------  Cardiac Enzymes  Recent Labs Lab 04/10/15 1409  TROPONINI <0.03   ------------------------------------------------------------------------------------------------------------------  RADIOLOGY:  Dg Chest 1 View  04/10/2015  CLINICAL DATA:  Shortness of breath, COPD. EXAM: CHEST 1 VIEW COMPARISON:  CT scan of the chest of March 07, 2015 FINDINGS: The lungs are adequately  inflated. There is stable apical pleural thickening bilaterally. Subtle increased density at the left lung base is present with partial obscuration of the hemidiaphragm. There is no significant pleural effusion. The heart is top-normal in size. The central pulmonary vascularity is prominent but stable. The mediastinum is normal in width. The bony thorax exhibits no acute abnormality. IMPRESSION: Mild atelectasis or early pneumonia in the left lower lobe. There may be a trace of pleural fluid. There is no pulmonary edema. A PA and lateral chest x-ray would be useful when the patient can tolerate the procedure. Electronically Signed   By: David  Martinique M.D.   On: 04/10/2015 14:12   IMPRESSION AND PLAN:   * Acute on chronic hypoxic  respiratory failure: likely due to COPD exacerbation : Will start nebulizer breathing treatment, and antibiotics, steroids. Pulmonary consultation  * Dysphagia; consult GI, was scheduled to have EGD with dietitian with Dr. Lucilla Lame tomorrow.  * Acute on chronic systolic CHF: Followed by Dr Fletcher Anon. Will c/s him.  Continue Lasix 40 mg twice a day.  * Atrial fibrillation: We will monitor on telemetry.  Continue Xarelto her rate is well controlled at this time on coreg.  * Hypokalemia: Replete and recheck    All the records are reviewed and case discussed with ED provider. Management plans discussed with the patient, family and they are in agreement.  CODE STATUS: Full code  TOTAL TIME TAKING CARE OF THIS PATIENT: 55 minutes.    [facility], Jaileigh Weimer M.D on 04/10/2015 at 4:08 PM  Between 7am to 6pm - Pager - [phone]  After 6pm go to [email or web address] - password [email or web address] [facility]  Office  [phone]  CC: Primary care physician; [name], MD

## 2015-04-10 NOTE — ED Provider Notes (Signed)
[facility] Emergency Department Provider Note  ____________________________________________  Time seen: Seen upon arrival to the emergency department  I have reviewed the triage vital signs and the nursing notes.   HISTORY  Chief Complaint Shortness of Breath    HPI [name] [name] is a 81 y.o. female with a history of COPD and heart failure who is presenting with shortness of breath over the past week. She denies intermittent chest pain but says she has had left sided chest pain which says has been going on for months.Says has had a cough over the past week as well. Is usually on 2-1/2 L of nasal cannula oxygen at home but had to be elevated to 4 L because of her shortness of breath and hypoxia.  Per EMS, the patient had "low" oxygen saturations at home. She is also complaining of burning with urination. In route she was given 125 mg Solu-Medrol as well as 1 DuoNeb and 1 regular albuterol neb.   Past Medical History  Diagnosis Date  . HTN (hypertension)   . COPD (chronic obstructive pulmonary disease) (HCC)     a. on home O2 nightly.  . Diverticulosis   . Takotsubo cardiomyopathy 10/2012    a. 10/2012: presented w/ cardiogenic shock/systolic CHF/VDRF - EF 40% by cath, 40% by f/u echo - cath with mild nonobstructive CAD. b. Not discharged on ACEI due to hypotension.  . Cardiogenic shock ([place])     a. 10/2012 - due to Takotsubo CM.  [name] Respiratory failure ([place])     a. On home O2 nightly. b. VDRF 10/2012 - due to CAP/pulm edema in setting of cardiogenic shock/Takotsubo CM.  [name] CAP (community acquired pneumonia)     a. 10/2012 - will need f/u CXR in June 2014 to ensure clearance.  . Esophageal stricture     a. s/p dilitation 10/2012.  [name] Pleural effusion, right     a. s/p thoracentesis 10/28/12.  . Atrial fibrillation ([place])     a. Dx 10/2012, rate controlled, started on Xarelto.  . Claudication ([place])     a. ABI 10/2012 - R 0.87, L 0.74.  [name] GERD (gastroesophageal reflux  disease)   . CKD (chronic kidney disease), stage III   . Breast CA (HCC)     hx  . Abdominal hernia     chronic  . STEMI (ST elevation myocardial infarction) ([place])   . Cellulitis   . CHF (congestive heart failure) ([place])   . CAD (coronary artery disease)     a. 10/2012 - presented w/ STEMI felt due to Takotsubo - mild nonobstructive dz by cath.    Patient Active Problem List   Diagnosis Date Noted  . Diverticulitis large intestine 03/28/2015  . Acute on chronic respiratory failure with hypoxemia ([place]) 03/03/2015  . Essential hypertension 04/08/2014  . Chronic systolic heart failure ([place]) 04/30/2013  . PAD (peripheral artery disease) ([place]) 12/07/2012  . Atrial fibrillation ([place]) 10/31/2012  . Pleural effusion 10/29/2012  . Acute systolic CHF (congestive heart failure) ([place]) 10/28/2012  . Acute respiratory failure ([place]) 10/28/2012  . Hypokalemia 10/28/2012  . Cardiogenic shock ([place]) 10/28/2012  . GERD (gastroesophageal reflux disease) 10/28/2012  . Esophageal stricture 10/28/2012  . Community acquired pneumonia 10/26/2012  . Takotsubo syndrome 10/25/2012    Past Surgical History  Procedure Laterality Date  . Mastectomy    . Cataract extraction    . Esophagogastroduodenoscopy N/A 11/05/2012    Procedure: ESOPHAGOGASTRODUODENOSCOPY (EGD) with esophageal savary dilatation;  Surgeon: Missy Sabins,  MD;  Location: [facility] ENDOSCOPY;  Service: Endoscopy;  Laterality: N/A;  savary dil...no floro needed  . Bladder surgery    . Left heart catheterization with coronary angiogram N/A 10/24/2012    Procedure: LEFT HEART CATHETERIZATION WITH CORONARY ANGIOGRAM;  Surgeon: Sherren Mocha, MD;  Location: [facility] CATH LAB;  Service: Cardiovascular;  Laterality: N/A;  . Peripheral vascular catheterization N/A 02/07/2015    Procedure: Abdominal Aortogram w/Lower Extremity;  Surgeon: Katha Cabal, MD;  Location: [place] CV LAB;  Service: Cardiovascular;  Laterality: N/A;  . Peripheral vascular  catheterization  02/07/2015    Procedure: Lower Extremity Intervention;  Surgeon: Katha Cabal, MD;  Location: [facility] CV LAB;  Service: Cardiovascular;;  . Coronary angioplasty      Current Outpatient Rx  Name  Route  Sig  Dispense  Refill  . albuterol (PROVENTIL HFA;VENTOLIN HFA) 108 (90 BASE) MCG/ACT inhaler   Inhalation   Inhale 2 puffs into the lungs every 4 (four) hours as needed for wheezing or shortness of breath.         [name] albuterol (PROVENTIL) (2.5 MG/3ML) 0.083% nebulizer solution   Nebulization   Take 2.5 mg by nebulization 4 (four) times daily as needed for wheezing or shortness of breath.         . carvedilol (COREG) 6.25 MG tablet   Oral   Take 6.25 mg by mouth 2 (two) times daily with a meal.         . ciprofloxacin (CIPRO) 500 MG tablet   Oral   Take 1 tablet (500 mg total) by mouth 2 (two) times daily.   20 tablet   0   . Fluticasone-Salmeterol (ADVAIR) 250-50 MCG/DOSE AEPB   Inhalation   Inhale 1 puff into the lungs 2 (two) times daily.         . furosemide (LASIX) 40 MG tablet   Oral   Take 40 mg by mouth 2 (two) times daily.          [name] gabapentin (NEURONTIN) 100 MG capsule      TAKE 1 CAPSULE (100 MG TOTAL) BY MOUTH 3 (THREE) TIMES DAILY.      3   . loratadine (CLARITIN) 10 MG tablet   Oral   Take 10 mg by mouth daily.         . metroNIDAZOLE (FLAGYL) 500 MG tablet   Oral   Take 1 tablet (500 mg total) by mouth 3 (three) times daily.   21 tablet   0   . omeprazole (PRILOSEC) 20 MG capsule   Oral   Take 1 capsule (20 mg total) by mouth daily.   30 capsule   11   . pyridOXINE (VITAMIN B-6) 100 MG tablet   Oral   Take 100 mg by mouth daily.         . ranitidine (ZANTAC) 150 MG capsule      TAKE 1 CAPSULE BY MOUTH EVERY NIGHT      6   . Rivaroxaban (XARELTO) 15 MG TABS tablet   Oral   Take 15 mg by mouth at bedtime.         [name] tiotropium (SPIRIVA) 18 MCG inhalation capsule   Inhalation   Place 18 mcg into  inhaler and inhale daily.         . vitamin B-12 (CYANOCOBALAMIN) 1000 MCG tablet   Oral   Take 1,000 mcg by mouth daily.           Allergies Penicillins; Levaquin;  and Sulfa antibiotics  Family History  Problem Relation Age of Onset  . Heart attack Mother     Social History Social History  Substance Use Topics  . Smoking status: Former Smoker -- 1.00 packs/day for 55 years    Types: Cigarettes  . Smokeless tobacco: Former Systems developer  . Alcohol Use: No    Review of Systems Constitutional: No fever/chills Eyes: No visual changes. ENT: No sore throat. Cardiovascular: Denies chest pain. Respiratory: As above  Gastrointestinal: No abdominal pain.  No nausea, no vomiting.  No diarrhea.  No constipation. Genitourinary: As above  Musculoskeletal: Negative for back pain. Skin: Negative for rash. Neurological: Negative for headaches, focal weakness or numbness.  10-point ROS otherwise negative.  ____________________________________________   PHYSICAL EXAM:  VITAL SIGNS: ED Triage Vitals  Enc Vitals Group     BP 04/10/15 1359 128/71 mmHg     Pulse Rate 04/10/15 1359 74     Resp 04/10/15 1359 20     Temp 04/10/15 1359 97.6 F (36.4 C)     Temp src --      SpO2 04/10/15 1359 100 %     Weight 04/10/15 1359 161 lb 3.2 oz (73.12 kg)     Height 04/10/15 1359 5\' 7"  (1.702 m)     Head Cir --      Peak Flow --      Pain Score --      Pain Loc --      Pain Edu? --      Excl. in [place]? --     Constitutional: Alert and oriented. Well appearing and in no acute distress. Eyes: Conjunctivae are normal. PERRL. EOMI. Head: Atraumatic. Nose: No congestion/rhinnorhea. Mouth/Throat: Mucous membranes are moist.  Oropharynx non-erythematous. Neck: No stridor.   Cardiovascular: Normal rate, regular rhythm. Grossly normal heart sounds.  Good peripheral circulation. Respiratory: Mildly tachypneic without retractions. Speaking in full sentences. Severely decreased lung sounds throughout  with minimal wheezing on expiration.  Gastrointestinal: Soft and nontender. No distention. No abdominal bruits. No CVA tenderness. Musculoskeletal: No lower extremity tenderness nor edema.  No joint effusions. Neurologic:  Normal speech and language. No gross focal neurologic deficits are appreciated. No gait instability. Skin:  Skin is warm, dry and intact. No rash noted. Psychiatric: Mood and affect are normal. Speech and behavior are normal.  ____________________________________________   LABS (all labs ordered are listed, but only abnormal results are displayed)  Labs Reviewed  CBC WITH DIFFERENTIAL/PLATELET - Abnormal; Notable for the following:    RBC 3.70 (*)    Hemoglobin 11.4 (*)    HCT 34.9 (*)    RDW 14.8 (*)    All other components within normal limits  BASIC METABOLIC PANEL - Abnormal; Notable for the following:    Potassium 3.2 (*)    Chloride 92 (*)    CO2 40 (*)    Glucose, Bld 143 (*)    Creatinine, Ser 1.04 (*)    GFR calc non Af Amer 46 (*)    GFR calc Af Amer 53 (*)    All other components within normal limits  BRAIN NATRIURETIC PEPTIDE - Abnormal; Notable for the following:    B Natriuretic Peptide 185.0 (*)    All other components within normal limits  URINALYSIS COMPLETEWITH MICROSCOPIC ([facility] ONLY) - Abnormal; Notable for the following:    Color, Urine STRAW (*)    APPearance CLEAR (*)    Glucose, UA 50 (*)    Specific Gravity, Urine 1.004 (*)  Hgb urine dipstick 1+ (*)    Leukocytes, UA TRACE (*)    Squamous Epithelial / LPF 0-5 (*)    All other components within normal limits  TROPONIN I   ____________________________________________  EKG  ED ECG REPORT I, Jazzelle Zhang,  Youlanda Roys, the attending physician, personally viewed and interpreted this ECG.   Date: 04/10/2015  EKG Time: 1441  Rate: 72  Rhythm: atrial fibrillation, rate 72  Axis: Normal axis  Intervals:none  ST&T Change: No ST segment elevation or depression. No abnormal T-wave  inversion.   RADIOLOGY  Mild atelectasis or early pneumonia in the left lower lobe. ____________________________________________   PROCEDURES ____________________________________________   INITIAL IMPRESSION / ASSESSMENT AND PLAN / ED COURSE  Pertinent labs & imaging results that were available during my care of the patient were reviewed by me and considered in my medical decision making (see chart for details).  ----------------------------------------- 3:41 PM on 04/10/2015 -----------------------------------------  Patient with improved respirations but says does not feel back to her baseline.. Continues to talk in full sentences. However, continues to be mildly tachypneic and with wheezing throughout especially on the left field. We'll admit to the hospital secondary to persistent wheezing, hospital-acquired pneumonia and multiple medication allergies which would make this for a difficult to treat this now outpatient.  Patient aware me permission and willing to comply. Signed out to Dr. Roosevelt Locks the hospitalist service. ____________________________________________   FINAL CLINICAL IMPRESSION(S) / ED DIAGNOSES  Acute COPD exacerbation with hospital-acquired pneumonia.    Orbie Pyo, MD 04/10/15 [phone]

## 2015-04-10 NOTE — ED Notes (Addendum)
Per EMS report, patient has had increased shortness of breath that also occurs at  Night while sleeping. Patient is on 2.5L O2 via [place] for COPD and home health nurse increased it to 4L due to O2 sats were "low." When placed upon 3 L O2 patient's sats were 95%. Patient denies cough, just increased shortness of breath at rest. Patient was given Duoneb, Albuterol neb and 125mg  Solumedrol in the ambulance.

## 2015-04-10 NOTE — Telephone Encounter (Signed)
Needs to clarify directions on Furosemide. Please call.

## 2015-04-10 NOTE — Telephone Encounter (Signed)
[facility] called and requested permission to do xray for SOB and wheezing. Ok was given and patient decided to call 911 instead. Will await ER report. [facility]

## 2015-04-10 NOTE — Telephone Encounter (Signed)
S/w PT, [name] [name], who is at pt home along w/HH nurse. Pt states she can not remember lasix dosage and has asked for clarification. Reviewed Dr. Tyrell Antonio instructions of 40mg  BID. [name] [name] verbalized understanding and will give info to pt.

## 2015-04-10 NOTE — Progress Notes (Signed)
   04/10/15 1900  Clinical Encounter Type  Visited With Patient and family together  Visit Type Initial  Referral From Nurse  Consult/Referral To Chaplain  Spiritual Encounters  Spiritual Needs Other (Comment)  Stress Factors  Patient Stress Factors None identified  Family Stress Factors None identified  Chaplain received an order. Went to visit and check on patient. Patient stated she was feeling some better. Offered services but no request at this time. WIll refer to unit chaplain for follow-up. Chaplain Yi Falletta A. Khya Halls Ext. [phone]

## 2015-04-10 NOTE — ED Notes (Signed)
Patient given food and drink per Dr. Clearnce Hasten' approval.

## 2015-04-11 ENCOUNTER — Encounter
Admission: EM | Disposition: A | Discharge status: Skilled Nursing Facility | Payer: Self-pay | Source: Home / Self Care | Attending: Internal Medicine

## 2015-04-11 ENCOUNTER — Inpatient Hospital Stay: Payer: Medicare Other | Admitting: Anesthesiology

## 2015-04-11 ENCOUNTER — Ambulatory Visit: Admission: RE | Admit: 2015-04-11 | Payer: Medicare Other | Source: Ambulatory Visit | Admitting: Gastroenterology

## 2015-04-11 ENCOUNTER — Encounter: Payer: Self-pay | Admitting: Anesthesiology

## 2015-04-11 ENCOUNTER — Encounter: Payer: Self-pay | Admitting: *Deleted

## 2015-04-11 DIAGNOSIS — J9621 Acute and chronic respiratory failure with hypoxia: Secondary | ICD-10-CM

## 2015-04-11 DIAGNOSIS — R1310 Dysphagia, unspecified: Secondary | ICD-10-CM

## 2015-04-11 DIAGNOSIS — I482 Chronic atrial fibrillation: Secondary | ICD-10-CM

## 2015-04-11 DIAGNOSIS — I5033 Acute on chronic diastolic (congestive) heart failure: Secondary | ICD-10-CM

## 2015-04-11 DIAGNOSIS — J441 Chronic obstructive pulmonary disease with (acute) exacerbation: Secondary | ICD-10-CM

## 2015-04-11 LAB — CBC
HEMATOCRIT: 33.8 % — AB (ref 35.0–47.0)
HEMOGLOBIN: 11.6 g/dL — AB (ref 12.0–16.0)
MCH: 31.9 pg (ref 26.0–34.0)
MCHC: 34.2 g/dL (ref 32.0–36.0)
MCV: 93.3 fL (ref 80.0–100.0)
Platelets: 218 10*3/uL (ref 150–440)
RBC: 3.62 MIL/uL — AB (ref 3.80–5.20)
RDW: 14.8 % — ABNORMAL HIGH (ref 11.5–14.5)
WBC: 7.9 10*3/uL (ref 3.6–11.0)

## 2015-04-11 LAB — BASIC METABOLIC PANEL
Anion gap: 9 (ref 5–15)
BUN: 25 mg/dL — ABNORMAL HIGH (ref 6–20)
CHLORIDE: 93 mmol/L — AB (ref 101–111)
CO2: 37 mmol/L — AB (ref 22–32)
Calcium: 8.9 mg/dL (ref 8.9–10.3)
Creatinine, Ser: 1.6 mg/dL — ABNORMAL HIGH (ref 0.44–1.00)
GFR calc non Af Amer: 27 mL/min — ABNORMAL LOW (ref >60)
GFR, EST AFRICAN AMERICAN: 32 mL/min — AB (ref >60)
Glucose, Bld: 250 mg/dL — ABNORMAL HIGH (ref 65–99)
POTASSIUM: 3.8 mmol/L (ref 3.5–5.1)
SODIUM: 139 mmol/L (ref 135–145)

## 2015-04-11 SURGERY — ESOPHAGOGASTRODUODENOSCOPY (EGD) WITH PROPOFOL
Anesthesia: General

## 2015-04-11 MED ORDER — FUROSEMIDE 10 MG/ML IJ SOLN
40.0000 mg | Freq: Once | INTRAMUSCULAR | Status: AC
  Administered 2015-04-11: 40 mg via INTRAVENOUS
  Filled 2015-04-11: qty 4

## 2015-04-11 MED ORDER — SODIUM CHLORIDE 0.9 % IV SOLN: INTRAVENOUS | Status: DC

## 2015-04-11 MED ORDER — SODIUM CHLORIDE 0.9 % IV SOLN
INTRAVENOUS | Status: DC
  Administered 2015-04-11: 09:00:00 via INTRAVENOUS

## 2015-04-11 MED ORDER — IPRATROPIUM-ALBUTEROL 0.5-2.5 (3) MG/3ML IN SOLN
RESPIRATORY_TRACT | Status: AC
  Filled 2015-04-11: qty 3

## 2015-04-11 MED ORDER — POTASSIUM CHLORIDE CRYS ER 20 MEQ PO TBCR
40.0000 meq | EXTENDED_RELEASE_TABLET | Freq: Once | ORAL | Status: AC
Start: 2015-04-11 — End: 2015-04-11
  Administered 2015-04-11: 40 meq via ORAL

## 2015-04-11 MED ORDER — LEVOFLOXACIN 750 MG PO TABS: 750.0000 mg | ORAL_TABLET | Freq: Every day | ORAL | Status: DC

## 2015-04-11 MED ORDER — PREDNISONE 50 MG PO TABS
50.0000 mg | ORAL_TABLET | Freq: Every day | ORAL | Status: DC
  Administered 2015-04-11 – 2015-04-12 (×2): 50 mg via ORAL
  Filled 2015-04-11 (×3): qty 1

## 2015-04-11 MED ORDER — AZITHROMYCIN 250 MG PO TABS
500.0000 mg | ORAL_TABLET | Freq: Every day | ORAL | Status: DC
  Administered 2015-04-12 – 2015-04-14 (×3): 500 mg via ORAL
  Filled 2015-04-11 (×3): qty 2

## 2015-04-11 MED ORDER — POTASSIUM CHLORIDE CRYS ER 20 MEQ PO TBCR
20.0000 meq | EXTENDED_RELEASE_TABLET | Freq: Once | ORAL | Status: AC
  Administered 2015-04-11: 20 meq via ORAL
  Filled 2015-04-11: qty 1

## 2015-04-11 MED ORDER — NITROGLYCERIN 0.4 MG SL SUBL
0.4000 mg | SUBLINGUAL_TABLET | SUBLINGUAL | Status: DC | PRN
  Administered 2015-04-12: 0.4 mg via SUBLINGUAL
  Filled 2015-04-11: qty 1

## 2015-04-11 MED ORDER — DEXTROSE 5 % IV SOLN
500.0000 mg | INTRAVENOUS | Status: DC
Start: 2015-04-11 — End: 2015-04-11
  Administered 2015-04-11: 500 mg via INTRAVENOUS
  Filled 2015-04-11: qty 500

## 2015-04-11 MED ORDER — DEXTROSE 5 % IV SOLN
1.0000 g | INTRAVENOUS | Status: DC
  Administered 2015-04-11 – 2015-04-13 (×3): 1 g via INTRAVENOUS
  Filled 2015-04-11 (×3): qty 10

## 2015-04-11 NOTE — Progress Notes (Signed)
Patient called out complaining of left-sided chest pain. RN assessed patient and NT obtained VS. BP 129/65, HR 77, T 97.6, RR 18, and SpO2 90%. Heart sounds WNL and nothing new on telemetry monitor. Patient described pain as "quick and sharp" and has lasted longer than others she's had in the past. Notified MD and received orders to give SL Nitro and a Troponin I to be drawn. Nursing staff will continue to monitor. Earleen Reaper, RN

## 2015-04-11 NOTE — Care Management Note (Signed)
Case Management Note  Patient Details  Name: [name] [name] MRN: [id number] Date of Birth: 16-Dec-1922  Subjective/Objective:              Patient with recent discharge from [facility] with [place] and physical therapy.  She presents again with shortness of breath. Agency is aware of admission.  Patient is on chronic home 02 at 2 liters.  She does have history of copd and chf.  Asked to be evaluated for thn. She is admitted under COPD gold protocol.  Would recommend adding OT to home health for energy conservation.  Patient does have home nebulizer  Action/Plan:   Expected Discharge Date:                  Expected Discharge Plan:     In-House Referral:     Discharge planning Services     Post Acute Care Choice:    Choice offered to:     DME Arranged:    DME Agency:     HH Arranged:    [place] Agency:     Status of Service:     Medicare Important Message Given:    Date Medicare IM Given:    Medicare IM give by:    Date Additional Medicare IM Given:    Additional Medicare Important Message give by:     If discussed at [place] of Stay Meetings, dates discussed:    Additional Comments:  Katrina Stack, RN 04/11/2015, 2:21 PM

## 2015-04-11 NOTE — Progress Notes (Signed)
[facility] [place] [place] NAME: [name] [name]    MR#:  [id number]  DATE OF BIRTH:  [birth year]  SUBJECTIVE:  Patient feeling much better this morning. Patient assurance of breath or wheezing.  REVIEW OF SYSTEMS:    Review of Systems  Constitutional: Negative for fever, chills and malaise/fatigue.  HENT: Negative for sore throat.   Eyes: Negative for blurred vision.  Respiratory: Negative for cough, hemoptysis, shortness of breath and wheezing.   Cardiovascular: Negative for chest pain, palpitations and leg swelling.  Gastrointestinal: Negative for nausea, vomiting, abdominal pain, diarrhea and blood in stool.  Genitourinary: Negative for dysuria.  Musculoskeletal: Negative for back pain.  Neurological: Negative for dizziness, tremors and headaches.  Endo/Heme/Allergies: Does not bruise/bleed easily.    Tolerating Diet: Nothing by mouth for EGD      DRUG ALLERGIES:   Allergies  Allergen Reactions  . Penicillins Itching, Swelling, Rash and Other (See Comments)    Pt is unable to answer additional questions about this medication because it happened so long ago.  [name] [Levofloxacin In D5w]   . Sulfa Antibiotics Other (See Comments)    Reaction:  Unknown     VITALS:  Blood pressure 121/65, pulse 79, temperature 97.5 F (36.4 C), temperature source Oral, resp. rate 18, height 5\' 7"  (1.702 m), weight 70.852 kg (156 lb 3.2 oz), SpO2 100 %.  PHYSICAL EXAMINATION:   Physical Exam  Constitutional: She is oriented to person, place, and time and well-developed, well-nourished, and in no distress. No distress.  HENT:  Head: Normocephalic.  Eyes: No scleral icterus.  Neck: Normal range of motion. Neck supple. No JVD present. No tracheal deviation present.  Cardiovascular: Normal rate, regular rhythm and normal heart sounds.  Exam reveals no gallop and no friction rub.   No murmur heard. Pulmonary/Chest: Effort normal and breath  sounds normal. No respiratory distress. She has no wheezes. She has no rales. She exhibits no tenderness.  Abdominal: Soft. Bowel sounds are normal. She exhibits no distension and no mass. There is no tenderness. There is no rebound and no guarding.  Musculoskeletal: Normal range of motion. She exhibits no edema.  Neurological: She is alert and oriented to person, place, and time.  Skin: Skin is warm. No rash noted. No erythema.  Psychiatric: Affect and judgment normal.      LABORATORY PANEL:   CBC  Recent Labs Lab 04/11/15 0408  WBC 7.9  HGB 11.6*  HCT 33.8*  PLT 218   ------------------------------------------------------------------------------------------------------------------  Chemistries   Recent Labs Lab 04/10/15 1409  NA 141  K 3.2*  CL 92*  CO2 40*  GLUCOSE 143*  BUN 17  CREATININE 1.04*  CALCIUM 9.0   ------------------------------------------------------------------------------------------------------------------  Cardiac Enzymes  Recent Labs Lab 04/10/15 1409  TROPONINI <0.03   ------------------------------------------------------------------------------------------------------------------  RADIOLOGY:  Dg Chest 1 View  04/10/2015  CLINICAL DATA:  Shortness of breath, COPD. EXAM: CHEST 1 VIEW COMPARISON:  CT scan of the chest of March 07, 2015 FINDINGS: The lungs are adequately inflated. There is stable apical pleural thickening bilaterally. Subtle increased density at the left lung base is present with partial obscuration of the hemidiaphragm. There is no significant pleural effusion. The heart is top-normal in size. The central pulmonary vascularity is prominent but stable. The mediastinum is normal in width. The bony thorax exhibits no acute abnormality. IMPRESSION: Mild atelectasis or early pneumonia in the left lower lobe. There may be a trace of pleural fluid. There is  no pulmonary edema. A PA and lateral chest x-ray would be useful when the  patient can tolerate the procedure. Electronically Signed   By: David  Martinique M.D.   On: 04/10/2015 14:12     ASSESSMENT AND PLAN:   81 year old female with a history of mild nonobstructive CAD, chronic respiratory failure on 2 and half liters of oxygen for COPD and chronic systolic heart failure due to recurrent stress-induced cardiomyopathy, chronic atrial fibrillation on the relative who presented with shortness of breath.  1. Acute on chronic respiratory hypoxic : This is likely due to a combination of COPD exacerbation with very mild acute on chronic systolic heart failure and pneumonia. Patient is currently on her baseline oxygen. She is started on Rocephin and azithromycin for pneumonia. She is on steroids for COPD exacerbation and small dose of IV Lasix was given this morning for mild CHF exacerbation.  2. Acute COPD exacerbation: Patient continue antibiotics, oxygen, nebulizer and inhaler treatment.  3. Acute on chronic systolic heart failure: [place] cardiology consultation. 2-D echocardiogram is pending. Patient was given 1 dose of 40 mg of IV Lasix. Patient will continue Coreg. She will continue on her oral Lasix dose  4. Hypokalemia: Potassium will be repleted.  5. Chronic atrial fibrillation: Her heart rate is controlled. Anticoagulation has been on hold since Saturday for planned procedure today. Continue Coreg  6. Dysphagia: Patient is planned for EGD today.  7. Essential hypertension: Blood pressures controlled. Continue Coreg    Management plans discussed with the patient and she is in agreement.  CODE STATUS: FULL   TOTAL TIME TAKING CARE OF THIS PATIENT: 30 minutes.     POSSIBLE D/C tomorrow, DEPENDING ON CLINICAL CONDITION.   [name] [name] M.D on 04/11/2015 at 12:21 PM  Between 7am to 6pm - Pager - [phone] After 6pm go to [email or web address] - password [email or web address] [facility]  Office  [phone]  CC: Primary care physician; [name] Doe, MD  Note: This dictation was prepared with Dragon dictation along with smaller phrase technology. Any transcriptional errors that result from this process are unintentional.

## 2015-04-11 NOTE — Progress Notes (Signed)
Initial Nutrition Assessment    INTERVENTION:   Meals and Snacks: Cater to patient preferences   NUTRITION DIAGNOSIS:   No nutrition diagnosis at this time  GOAL:   Patient will meet greater than or equal to 90% of their needs  MONITOR:    (Energy Intake, Anthropometrics, Electrolyte/Renal Profile, Glucose Profile, Digestive System)  REASON FOR ASSESSMENT:   Consult Assessment of nutrition requirement/status  ASSESSMENT:    Pt admitted with SOB, acute on chronic respiratory failure with acute COPD exacerbation, acute on chronic CHF; dysphagia with plan for EGD today  Past Medical History  Diagnosis Date  . HTN (hypertension)   . COPD (chronic obstructive pulmonary disease) (HCC)     a. on home O2 nightly.  . Diverticulosis   . Takotsubo cardiomyopathy 10/2012    a. 10/2012: presented w/ cardiogenic shock/systolic CHF/VDRF - EF 28% by cath, 40% by f/u echo - cath with mild nonobstructive CAD. b. Not discharged on ACEI due to hypotension.  . Cardiogenic shock ([place])     a. 10/2012 - due to Takotsubo CM.  [name] Respiratory failure ([place])     a. On home O2 nightly. b. VDRF 10/2012 - due to CAP/pulm edema in setting of cardiogenic shock/Takotsubo CM.  [name] CAP (community acquired pneumonia)     a. 10/2012 - will need f/u CXR in June 2014 to ensure clearance.  . Esophageal stricture     a. s/p dilitation 10/2012.  [name] Pleural effusion, right     a. s/p thoracentesis 10/28/12.  . Atrial fibrillation ([place])     a. Dx 10/2012, rate controlled, started on Xarelto.  . Claudication ([place])     a. ABI 10/2012 - R 0.87, L 0.74.  [name] GERD (gastroesophageal reflux disease)   . CKD (chronic kidney disease), stage III   . Breast CA (HCC)     hx  . Abdominal hernia     chronic  . STEMI (ST elevation myocardial infarction) ([place])   . Cellulitis   . CHF (congestive heart failure) ([place])   . CAD (coronary artery disease)     a. 10/2012 - presented w/ STEMI felt due to Takotsubo - mild nonobstructive dz  by cath.    Diet Order:  Diet Heart Room service appropriate?: Yes; Fluid consistency:: Thin  Energy Intake: recorded po intake 100% of lunch today; pt reports very good appetite  Food and Nutrition Related History: pt reports good appetite prior to admission  Digestive System: pt wears dentures but has not been wearing recently due to dry mouth; pt reports she can eat most foods without her dentures but avoids salads, tough meats, etc. Pt does report some difficulty swallowing but tolerated lunch today with no issues. Pt reports she takes small bites, chews food well, drinks liquids after bites, etc  Electrolyte and Renal Profile:  Recent Labs Lab 04/10/15 1409 04/11/15 0408  BUN 17 25*  CREATININE 1.04* 1.60*  NA 141 139  K 3.2* 3.8   Meds: lasix, prednisone, Kcl   Height:   Ht Readings from Last 1 Encounters:  04/10/15 5\' 7"  (1.702 m)    Weight:   Wt Readings from Last 1 Encounters:  04/11/15 156 lb 3.2 oz (70.852 kg)    Wt Readings from Last 10 Encounters:  04/11/15 156 lb 3.2 oz (70.852 kg)  03/31/15 156 lb 8 oz (70.988 kg)  03/28/15 155 lb 9.6 oz (70.58 kg)  03/15/15 153 lb 6.4 oz (69.582 kg)  03/03/15 148 lb (67.132 kg)  02/16/15 152  lb (68.947 kg)  02/07/15 158 lb (71.668 kg)  12/28/14 159 lb 9.6 oz (72.394 kg)  12/23/14 158 lb 12.8 oz (72.031 kg)  09/15/14 159 lb (72.122 kg)   BMI:  Body mass index is 24.46 kg/(m^2).  LOW Care Level  Kerman Passey MS, [place], LDN [phone] Pager

## 2015-04-11 NOTE — Progress Notes (Signed)
Patient has rested quietly tonight. No complaints of pain and no signs of discomfort or distress noted. Patient has remained NPO for EGD scheduled for today. Nursing staff will continue to monitor. Earleen Reaper, RN

## 2015-04-11 NOTE — Consult Note (Signed)
   [facility] CM Inpatient Consult   04/11/2015  [name] [name] 23-Jul-1922 [id number]  Inpatient care manager recommends that patient may benefit from [facility] care management services. Met with the patient at bedside to offer [place] [facility] services . Patient agreeable to services and will receive post hospital discharge call and will be evaluated for monthly home visits. Consent signed, and [facility] information packet given to patient.    Of note, [facility] Care Management services does not replace or interfere with any services that are arranged by inpatient case management or social work. For additional questions or referrals please contact:  Joylene Draft, RN, [place] Management/Hospital Liaison [phone]- Mobile [phone]- [place]

## 2015-04-11 NOTE — Progress Notes (Signed)
Date: 04/11/2015,   MRN# [id number] JENNY LAI 1922/10/21 Code Status:     Code Status Orders        Start     Ordered   04/10/15 1811  Full code   Continuous     04/10/15 1810     Hosp day:@LENGTHOFSTAYDAYS @ Referring MD: @ATDPROV @        AdmissionWeight: 161 lb 3.2 oz (73.12 kg)                 CurrentWeight: 156 lb 3.2 oz (70.852 kg)   CC: copd flare  HPI: This is a 81 year old lady came in with the known hx of chronic systolic heart failure, afib, copd, sob,  low sats, on chronic home 02, cough,  vague chest pain. Since being  here her troponin levels have remained " flat."  Thus far being treated for copd exacerbation, mild CHF, bronchitis vs iaearly LLL pneumonia. Thus far Feeling much better since admission.   Off note she was recently here for SFA stent placement, popliteal angioplasty per Dr.  Ronalee Belts  PMHX:   Past Medical History  Diagnosis Date  . HTN (hypertension)   . COPD (chronic obstructive pulmonary disease) (HCC)     a. on home O2 nightly.  . Diverticulosis   . Takotsubo cardiomyopathy 10/2012    a. 10/2012: presented w/ cardiogenic shock/systolic CHF/VDRF - EF 38% by cath, 40% by f/u echo - cath with mild nonobstructive CAD. b. Not discharged on ACEI due to hypotension.  . Cardiogenic shock ([place])     a. 10/2012 - due to Takotsubo CM.  [name] Respiratory failure ([place])     a. On home O2 nightly. b. VDRF 10/2012 - due to CAP/pulm edema in setting of cardiogenic shock/Takotsubo CM.  [name] CAP (community acquired pneumonia)     a. 10/2012 - will need f/u CXR in June 2014 to ensure clearance.  . Esophageal stricture     a. s/p dilitation 10/2012.  [name] Pleural effusion, right     a. s/p thoracentesis 10/28/12.  . Atrial fibrillation ([place])     a. Dx 10/2012, rate controlled, started on Xarelto.  . Claudication ([place])     a. ABI 10/2012 - R 0.87, L 0.74.  [name] GERD (gastroesophageal reflux disease)   . CKD (chronic kidney disease), stage III   . Breast CA (HCC)     hx  .  Abdominal hernia     chronic  . STEMI (ST elevation myocardial infarction) ([place])   . Cellulitis   . CHF (congestive heart failure) ([place])   . CAD (coronary artery disease)     a. 10/2012 - presented w/ STEMI felt due to Takotsubo - mild nonobstructive dz by cath.   Surgical Hx:  Past Surgical History  Procedure Laterality Date  . Mastectomy    . Cataract extraction    . Esophagogastroduodenoscopy N/A 11/05/2012    Procedure: ESOPHAGOGASTRODUODENOSCOPY (EGD) with esophageal savary dilatation;  Surgeon: Missy Sabins, MD;  Location: [facility];  Service: Endoscopy;  Laterality: N/A;  savary dil...no floro needed  . Bladder surgery    . Left heart catheterization with coronary angiogram N/A 10/24/2012    Procedure: LEFT HEART CATHETERIZATION WITH CORONARY ANGIOGRAM;  Surgeon: Sherren Mocha, MD;  Location: [facility] CATH LAB;  Service: Cardiovascular;  Laterality: N/A;  . Peripheral vascular catheterization N/A 02/07/2015    Procedure: Abdominal Aortogram w/Lower Extremity;  Surgeon: Katha Cabal, MD;  Location: [place] CV LAB;  Service: Cardiovascular;  Laterality:  N/A;  . Peripheral vascular catheterization  02/07/2015    Procedure: Lower Extremity Intervention;  Surgeon: Katha Cabal, MD;  Location: [place] CV LAB;  Service: Cardiovascular;;  . Coronary angioplasty     Family Hx:  Family History  Problem Relation Age of Onset  . Heart attack Mother    Social Hx:   Social History  Substance Use Topics  . Smoking status: Former Smoker -- 1.00 packs/day for 55 years    Types: Cigarettes  . Smokeless tobacco: Former Systems developer  . Alcohol Use: No   Medication:    Home Medication:  No current outpatient prescriptions on file.  Current Medication: @CURMEDTAB @   Allergies:  Penicillins; Levaquin; and Sulfa antibiotics  Review of Systems: Gen:  Denies  fever, sweats, chills HEENT: Denies blurred vision, double vision, ear pain, eye pain, hearing loss, nose bleeds, sore  throat Cvc:  No dizziness, chest pain or heaviness Resp:   Less sob, cough or wheezing Gi: Denies swallowing difficulty, stomach pain, nausea or vomiting, diarrhea, constipation, bowel incontinence Gu:  Denies bladder incontinence, burning urine Ext:   No Joint pain, stiffness or swelling Skin: No skin rash, easy bruising or bleeding or hives Endoc:  No polyuria, polydipsia , polyphagia or weight change Psych: No depression, insomnia or hallucinations  Other:  All other systems negative  Physical Examination:   VS: BP 121/65 mmHg  Pulse 79  Temp(Src) 97.5 F (36.4 C) (Oral)  Resp 18  Ht 5\' 7"  (1.702 m)  Wt 156 lb 3.2 oz (70.852 kg)  BMI 24.46 kg/m2  SpO2 100%  General Appearance: No distress, sitting up in chair, family in the room.  Neuropsych without focal findings, mental status, speech normal, alert and oriented, cranial nerves 2-12 intact, reflexes normal and symmetric, sensation grossly normal  HEENT: PERRLA, EOM intact, no ptosis, no other lesions noticed Neck: Supple, trachea mid line, no stridor Pulmonary:.rare wheezing, No rales  No use of accessory muscles Cardiovascular:  Irreg, Normal S1,S2.  .  .    Abdomen:Benign, Soft, non-tender, No masses, hepatosplenomegaly, No lymphadenopathy Endoc: No evident thyromegaly, no signs of acromegaly or Cushing features Skin:   warm, no rashes, ve ecchymosis  Extremities: normal, no cyanosis, clubbing, no edema, warm with normal capillary refill.    Labs results:   Recent Labs     04/10/15  1409  04/11/15  0408  HGB  11.4*  11.6*  HCT  34.9*  33.8*  MCV  94.2  93.3  WBC  7.1  7.9  BUN  17  25*  CREATININE  1.04*  1.60*  GLUCOSE  143*  250*  CALCIUM  9.0  8.9  ,   Cr 1.6, k 3.8   B Natriuretic Peptide 0.0 - 100.0 pg/mL 185.0 (H) 241.0 (H)    Resulting Agency  [facility]      Specimen Collected: 04/10/15 2:09 PM Last Resulted: 04/10/15 3:12 PM             Troponin I <0.031 ng/mL <0.03 <0.03CM <0.03CM  <0.03CM 0.05 (H)CM         Rad results:  CLINICAL DATA: Shortness of breath, COPD.  EXAM: CHEST 1 VIEW  COMPARISON: CT scan of the chest of March 07, 2015  FINDINGS: The lungs are adequately inflated. There is stable apical pleural thickening bilaterally. Subtle increased density at the left lung base is present with partial obscuration of the hemidiaphragm. There is no significant pleural effusion. The heart is top-normal in size. The central  pulmonary vascularity is prominent but stable. The mediastinum is normal in width. The bony thorax exhibits no acute abnormality.  IMPRESSION: Mild atelectasis or early pneumonia in the left lower lobe. There may be a trace of pleural fluid. There is no pulmonary edema. A PA and lateral chest x-ray would be useful when the patient can tolerate the procedure.   Electronically Signed  By: David Martinique M.D.  On: 04/10/2015 14:12    Assessment and Plan: Copd exacerbation, improving Continue dulera, spiriva, albuterol, steroid taper, o2  Possible early LLL pneumonia vs atelactasis Agree with empiric antibiotic regimen chosen If possible incentive spirometry Repeat chest xray 10-14 days as an out patient  Hx of chf, ? Mild chf on admission, presently not a major issue Continue cardiology recs Echo pending watch salt intake Following renal profile    I have personally obtained a history, examined the patient, evaluated laboratory and imaging results, formulated the assessment and plan and placed orders.  The Patient requires high complexity decision making for assessment and support, frequent evaluation and titration of therapies, application of advanced monitoring technologies and extensive interpretation of multiple databases.   Jamari Diana,M.D. Pulmonary & Critical care Medicine [facility]

## 2015-04-11 NOTE — Consult Note (Addendum)
Cardiology Consultation Note  Patient ID: [name] [name], MRN: [id number], DOB/AGE: 1923/03/02 81 y.o. Admit date: 04/10/2015   Date of Consult: 04/11/2015 Primary Physician: [name] Doe, MD Primary Cardiologist: Dr. Fletcher Anon, MD  Chief Complaint: SOB Reason for Consult: SOB  HPI: 81 y.o. female with h/o mild nonobstructive CAD by cardiac catheterization in 2505, chronic systolic CHF due to recurrent stress-induced cardiomyopathy, chronic Afib on Xarelto, PAD s/p SFA stenting and left popliteal angioplasty in August 2016 by Dr. Ronalee Belts, MD, chronic respiratory failure 2/2 COPD on home oxygen, CKD stage II-III, HTN, and GERD who presented to [facility] on 10/31 with increased SOB and was found to have acute on chronic respiratory failure. Cardiology is consulted for possible acute on chronic systolic CHF.   She was recently admitted to [facility] 02/2015 with acute on chronic respiratory failure with hypoxemia felt to be 2/2 COPD exacerbation. She was treated with IV steroids and discharged on prednisone and Levaquin. At her last office visit with Dr. Fletcher Anon, MD on 03/31/2015 she had increased her Coreg from 3.125 mg bid to 6.25 mg bid secondary to palpitations, which led to improvement. She was taking Lasix 40 mg bid and doing well. She called the office on 10/27 stating her home health nurse said she had a bad night, was SOB, was wheezing, and had crackles. She does not weight herself at home on a regular basis, thus we were unable to determine if there was any weight gain. She reported the last time she did weigh herself at home her weight was 158, at the time of her OV it was 156 pounds. PCP had increased her Lasix to 80 mg bid, this was felt to be too much and she was continued on 40 mg bid, symptoms improved.   She presented to [facility] on 10/31 with increased SOB, cough occasionally productive of green sputum, subjective lower extremity swelling, and non-exertional left sided chest pain. No nausea, vomiting,  palpitations, diaphoresis, presyncope, or syncope. She is now on home O2 at 2 to 2.5L daily, though not when she goes out on the town. Her oxygen requirement has gone up to 4L over the past 1 week 2/2 SOB. She called EMS and thy administered 125 mg of Solu-Medrol and 1 DuoNeb with some improvement in her symptoms.   Upon the patient's arrival to [facility] they were found to have BNP 185 (1 month prior 241), troponin negative x 1, SCr 1.04, K+ 3.2, WBC 7.9, hgb 11.6. ECG showed Afib, 72 bpm, CXR showed mild atelectasis or early PNA in the left lower lobe. Possible trace pleural fluid. No signs of pulmonary edema.    Past Medical History  Diagnosis Date  . HTN (hypertension)   . COPD (chronic obstructive pulmonary disease) (HCC)     a. on home O2 nightly.  . Diverticulosis   . Takotsubo cardiomyopathy 10/2012    a. 10/2012: presented w/ cardiogenic shock/systolic CHF/VDRF - EF 39% by cath, 40% by f/u echo - cath with mild nonobstructive CAD. b. Not discharged on ACEI due to hypotension.  . Cardiogenic shock ([place])     a. 10/2012 - due to Takotsubo CM.  [name] Respiratory failure ([place])     a. On home O2 nightly. b. VDRF 10/2012 - due to CAP/pulm edema in setting of cardiogenic shock/Takotsubo CM.  [name] CAP (community acquired pneumonia)     a. 10/2012 - will need f/u CXR in June 2014 to ensure clearance.  . Esophageal stricture     a. s/p  dilitation 10/2012.  [name] Pleural effusion, right     a. s/p thoracentesis 10/28/12.  . Atrial fibrillation ([place])     a. Dx 10/2012, rate controlled, started on Xarelto.  . Claudication ([place])     a. ABI 10/2012 - R 0.87, L 0.74.  [name] GERD (gastroesophageal reflux disease)   . CKD (chronic kidney disease), stage III   . Breast CA (HCC)     hx  . Abdominal hernia     chronic  . STEMI (ST elevation myocardial infarction) ([place])   . Cellulitis   . CHF (congestive heart failure) ([place])   . CAD (coronary artery disease)     a. 10/2012 - presented w/ STEMI felt due to Takotsubo - mild  nonobstructive dz by cath.      Most Recent Cardiac Studies: Echo 10/2012: Study Conclusions  - Left ventricle: The study is technically very limited. It seems that the LV dsyfunction is more global than focal. The EF may be in the 40% range. - Aortic valve: Sclerosis without stenosis. - Right ventricle: Not able to assess RV function due to poor visualization. Not able to assess RV size due to poor visualization. - Pulmonary arteries: PA peak pressure: 74mm Hg (S).   Cardiac cath 10/24/2012: Coronary angiography: Coronary dominance: right  Left mainstem: Widely patent with very mild 20% ostial stenosis. There is no significant calcification.  Left anterior descending (LAD): The LAD is patent to the left ventricular apex. The distal flow pattern in the LAD demonstrates mildly slow flow. There is diffuse irregularity but essentially the vessel is widely patent throughout. The diagonals are patent with mild ostial stenosis. There are no severe flow limiting stenoses throughout the course of the LAD distribution. The vessel wraps around the left ventricular apex.  Left circumflex (LCx): The left circumflex is also patent throughout. There is minor irregularity. There were 2 obtuse marginal branches with no significant stenoses. The left posterolateral branch is patent without significant stenosis.  Right coronary artery (RCA): The right coronary artery is a large, dominant vessel. The vessel is smooth throughout its course. The PDA and PLA branches are both large without significant stenoses the  Left ventriculography: Left ventricular function is markedly depressed. The ventricle has a classic appearance of Takotsubo cardiomyopathy. The anterior base and inferior base of the heart are dynamic. The entire anterolateral, periapical, and inferoapical/mid inferior walls are akinetic. The estimated left ventricular ejection fraction is 20%. There is no appreciable mitral  regurgitation.  Final Conclusions:  1. Acute cardiogenic shock secondary to Takotsubo syndrome with severe segmental left ventricular systolic dysfunction 2. Mild nonobstructive coronary artery disease   Surgical History:  Past Surgical History  Procedure Laterality Date  . Mastectomy    . Cataract extraction    . Esophagogastroduodenoscopy N/A 11/05/2012    Procedure: ESOPHAGOGASTRODUODENOSCOPY (EGD) with esophageal savary dilatation;  Surgeon: Missy Sabins, MD;  Location: [place];  Service: Endoscopy;  Laterality: N/A;  savary dil...no floro needed  . Bladder surgery    . Left heart catheterization with coronary angiogram N/A 10/24/2012    Procedure: LEFT HEART CATHETERIZATION WITH CORONARY ANGIOGRAM;  Surgeon: Sherren Mocha, MD;  Location: [facility] CATH LAB;  Service: Cardiovascular;  Laterality: N/A;  . Peripheral vascular catheterization N/A 02/07/2015    Procedure: Abdominal Aortogram w/Lower Extremity;  Surgeon: Katha Cabal, MD;  Location: [place] CV LAB;  Service: Cardiovascular;  Laterality: N/A;  . Peripheral vascular catheterization  02/07/2015    Procedure: Lower Extremity Intervention;  Surgeon:  Katha Cabal, MD;  Location: [place] CV LAB;  Service: Cardiovascular;;  . Coronary angioplasty       Home Meds: Prior to Admission medications   Medication Sig Start Date End Date Taking? Authorizing Provider  albuterol (PROVENTIL) (2.5 MG/3ML) 0.083% nebulizer solution Take 2.5 mg by nebulization 4 (four) times daily as needed for wheezing or shortness of breath.   Yes Historical Provider, MD  carvedilol (COREG) 6.25 MG tablet Take 6.25 mg by mouth 2 (two) times daily with a meal.   Yes Historical Provider, MD  Fluticasone-Salmeterol (ADVAIR) 250-50 MCG/DOSE AEPB Inhale 1 puff into the lungs 2 (two) times daily.   Yes Historical Provider, MD  furosemide (LASIX) 40 MG tablet Take 40 mg by mouth 2 (two) times daily.    Yes Historical Provider, MD  loratadine  (CLARITIN) 10 MG tablet Take 10 mg by mouth daily.   Yes Historical Provider, MD  omeprazole (PRILOSEC) 20 MG capsule Take 1 capsule (20 mg total) by mouth daily. 12/21/14  Yes Arlis Porta., MD  ranitidine (ZANTAC) 150 MG capsule TAKE 1 CAPSULE BY MOUTH EVERY NIGHT 02/28/15  Yes Historical Provider, MD  Rivaroxaban (XARELTO) 15 MG TABS tablet Take 15 mg by mouth at bedtime.   Yes Historical Provider, MD  tiotropium (SPIRIVA) 18 MCG inhalation capsule Place 18 mcg into inhaler and inhale daily.   Yes Historical Provider, MD  vitamin B-12 (CYANOCOBALAMIN) 1000 MCG tablet Take 1,000 mcg by mouth daily.   Yes Historical Provider, MD  albuterol (PROVENTIL HFA;VENTOLIN HFA) 108 (90 BASE) MCG/ACT inhaler Inhale 2 puffs into the lungs every 4 (four) hours as needed for wheezing or shortness of breath.    Historical Provider, MD  pyridOXINE (VITAMIN B-6) 100 MG tablet Take 100 mg by mouth daily.    Historical Provider, MD    Inpatient Medications:  . azithromycin  500 mg Intravenous Q24H  . carvedilol  6.25 mg Oral BID WC  . cefTRIAXone (ROCEPHIN)  IV  1 g Intravenous Q24H  . docusate sodium  100 mg Oral BID  . furosemide  40 mg Oral BID  . gabapentin  300 mg Oral TID  . ipratropium-albuterol  3 mL Nebulization Q6H  . loratadine  10 mg Oral Daily  . mometasone-formoterol  2 puff Inhalation BID  . pantoprazole  40 mg Oral Daily  . potassium chloride  20 mEq Oral BID  . predniSONE  50 mg Oral Q breakfast  . pyridOXINE  100 mg Oral Daily  . Rivaroxaban  15 mg Oral QHS  . sodium chloride  3 mL Intravenous Q12H  . tiotropium  18 mcg Inhalation Daily  . vitamin B-12  1,000 mcg Oral Daily   . sodium chloride    . sodium chloride      Allergies:  Allergies  Allergen Reactions  . Penicillins Itching, Swelling, Rash and Other (See Comments)    Pt is unable to answer additional questions about this medication because it happened so long ago.  [name] [Levofloxacin In D5w]   . Sulfa  Antibiotics Other (See Comments)    Reaction:  Unknown     Social History   Social History  . Marital Status: Widowed    Spouse Name: N/A  . Number of Children: 1  . Years of Education: N/A   Occupational History  . Not on file.   Social History Main Topics  . Smoking status: Former Smoker -- 1.00 packs/day for 55 years    Types: Cigarettes  .  Smokeless tobacco: Former Systems developer  . Alcohol Use: No  . Drug Use: No  . Sexual Activity: No   Other Topics Concern  . Not on file   Social History Narrative     Family History  Problem Relation Age of Onset  . Heart attack Mother      Review of Systems: Review of Systems  Constitutional: Positive for malaise/fatigue. Negative for fever, chills, weight loss and diaphoresis.  HENT: Negative for congestion.   Eyes: Negative for discharge and redness.  Respiratory: Positive for cough, sputum production and shortness of breath. Negative for hemoptysis and wheezing.   Cardiovascular: Positive for chest pain and leg swelling. Negative for palpitations, orthopnea, claudication and PND.  Gastrointestinal: Negative for heartburn, nausea, vomiting and abdominal pain.  Musculoskeletal: Negative for myalgias and falls.  Skin: Negative for rash.  Neurological: Positive for weakness. Negative for dizziness, tingling, tremors, sensory change, speech change, focal weakness and loss of consciousness.  Endo/Heme/Allergies: Does not bruise/bleed easily.  Psychiatric/Behavioral: Negative for substance abuse. The patient is nervous/anxious.   All other systems reviewed and are negative.   Labs:  Recent Labs  04/10/15 1409  TROPONINI <0.03   Lab Results  Component Value Date   WBC 7.9 04/11/2015   HGB 11.6* 04/11/2015   HCT 33.8* 04/11/2015   MCV 93.3 04/11/2015   PLT 218 04/11/2015    Recent Labs Lab 04/10/15 1409  NA 141  K 3.2*  CL 92*  CO2 40*  BUN 17  CREATININE 1.04*  CALCIUM 9.0  GLUCOSE 143*   Lab Results  Component  Value Date   CHOL 104 10/25/2012   HDL 66 10/25/2012   LDLCALC 27 10/25/2012   TRIG 53 10/25/2012   No results found for: DDIMER  Radiology/Studies:  Dg Chest 1 View  04/10/2015  CLINICAL DATA:  Shortness of breath, COPD. EXAM: CHEST 1 VIEW COMPARISON:  CT scan of the chest of March 07, 2015 FINDINGS: The lungs are adequately inflated. There is stable apical pleural thickening bilaterally. Subtle increased density at the left lung base is present with partial obscuration of the hemidiaphragm. There is no significant pleural effusion. The heart is top-normal in size. The central pulmonary vascularity is prominent but stable. The mediastinum is normal in width. The bony thorax exhibits no acute abnormality. IMPRESSION: Mild atelectasis or early pneumonia in the left lower lobe. There may be a trace of pleural fluid. There is no pulmonary edema. A PA and lateral chest x-ray would be useful when the patient can tolerate the procedure. Electronically Signed   By: David  Martinique M.D.   On: 04/10/2015 14:12    EKG: Afib, 72 bpm  Weights: Filed Weights   04/10/15 1359 04/11/15 0501  Weight: 161 lb 3.2 oz (73.12 kg) 156 lb 3.2 oz (70.852 kg)     Physical Exam: Blood pressure 115/50, pulse 75, temperature 97.7 F (36.5 C), temperature source Oral, resp. rate 18, height 5\' 7"  (1.702 m), weight 156 lb 3.2 oz (70.852 kg), SpO2 95 %. Body mass index is 24.46 kg/(m^2). General: Well developed, well nourished, in no acute distress. Head: Normocephalic, atraumatic, sclera non-icteric, no xanthomas, nares are without discharge.  Neck: Negative for carotid bruits. JVD not elevated. Lungs: Rhonchi left lower lobe with faint crackles. Breathing is unlabored. Heart: Irregularly-irregular, with S1 S2. No murmurs, rubs, or gallops appreciated. Abdomen: Soft, non-tender, non-distended with normoactive bowel sounds. No hepatomegaly. No rebound/guarding. No obvious abdominal masses. Msk:  Strength and tone  appear normal for age.  Extremities: No clubbing or cyanosis. No edema.  Distal pedal pulses are 2+ and equal bilaterally. Neuro: Alert and oriented X 3. No facial asymmetry. No focal deficit. Moves all extremities spontaneously. Psych:  Responds to questions appropriately with a normal affect.    Assessment and Plan:  81 y.o. female with h/o mild nonobstructive CAD by cardiac catheterization in 0814, chronic systolic CHF due to recurrent stress-induced cardiomyopathy, chronic Afib on Xarelto, PAD s/p SFA stenting and left popliteal angioplasty in August 2016 by Dr. Ronalee Belts,  COPD on home oxygen nightly, CKD stage II-III, HTN, and GERD who presented to [facility] on 10/31 with increased SOB and was found to have acute on chronic respiratory failure felt to be secondary to likely COPD exacerbation with component of acute on chronic diastolic CHF.   1. Acute on chronic respiratory failure: -Possibly multifactorial including COPD exacerbation and possible acute on chronic systolic CHF -Oxygen via nasal cannula has been weaned to 3L currently  -Continue to wean back to home level of 2-2.5L as tolerated  2. Mild acute on chronic systolic CHF: -Check echo to evaluate LV function and right right-sided pressure -Give IV Lasix 40 mg x 1 now with KCl 40 meq repletion now followed by 20 meq later -Continue Coreg 6.25 mg bid -Limit IV fluids  3. COPD exacerbation: -Rocephin and azithromycin per IM -Nebs per IM, consider changing albuterol to Xopenex given her Afib if needed for rate control -Wean oxygen as above  3. Chronic Afib: -Rate controlled -Coreg 6.25 mg bid -Previously advised to hold Xarelto x 2 days prior to EGD (last dose was 10/29 per patient) -Restart per GI  4. History of stress induced cardiomyopathy: -Check echo as above -Lasix as above -Limit IV fluids  5. Dysphagia: -She is for EGD later today per GI -Xarelto on hold as above, restart per GI  6. Hypokalemia: -Replete as above  to goal of 4.0  7. HTN: -Controlled -Continue current medications    Signed, Christell Faith, PA-C Pager: [phone] 04/11/2015, 9:25 AM   Attending Note Patient seen and examined, agree with detailed note above,  Patient presentation and plan discussed on rounds.   Patient presenting with respiratory distress, worsening shortness of breath Recent hospital admission for COPD exacerbation Current hospitalization likely continuation of her respiratory infection, Likely very narrow window given her severe COPD and brittle diastolic CHF, exacerbated by atrial fibrillation  Agree with gentle diuresis, continued antibiotics, steroids, nebulizer She reports feeling better on current management  She Was scheduled for EGD today as an outpatient. May be better to delay this until respiratory status improves She has been holding her anticoagulation for 3 days Could restart anticoagulation if EGD is to be scheduled at a later date or continue to hold if this will be done in the near future  Signed: Esmond Plants  M.D., Ph.D.

## 2015-04-11 NOTE — Anesthesia Preprocedure Evaluation (Signed)
Anesthesia Evaluation  Patient identified by MRN, date of birth, ID band Patient awake    Reviewed: Allergy & Precautions, H&P , NPO status , Patient's Chart, lab work & pertinent test results  Airway Mallampati: III  TM Distance: >3 FB Neck ROM: limited    Dental  (+) Poor Dentition   Pulmonary pneumonia, COPD, former smoker,    Pulmonary exam normal breath sounds clear to auscultation       Cardiovascular Exercise Tolerance: Poor hypertension, + CAD, + Past MI, + Peripheral Vascular Disease and +CHF  Normal cardiovascular exam Rhythm:regular Rate:Normal     Neuro/Psych negative neurological ROS  negative psych ROS   GI/Hepatic negative GI ROS, Neg liver ROS, GERD  ,  Endo/Other  negative endocrine ROS  Renal/GU Renal diseasenegative Renal ROS  negative genitourinary   Musculoskeletal   Abdominal   Peds  Hematology negative hematology ROS (+)   Anesthesia Other Findings Past Medical History:   HTN (hypertension)                                           COPD (chronic obstructive pulmonary disease) (*                Comment:a. on home O2 nightly.   Diverticulosis                                               Takotsubo cardiomyopathy                        10/2012         Comment:a. 10/2012: presented w/ cardiogenic               shock/systolic CHF/VDRF - EF 68% by cath, 40%               by f/u echo - cath with mild nonobstructive               CAD. b. Not discharged on ACEI due to               hypotension.   Cardiogenic shock ([place])                                        Comment:a. 10/2012 - due to Takotsubo CM.   Respiratory failure ([place])                                      Comment:a. On home O2 nightly. b. VDRF 10/2012 - due to               CAP/pulm edema in setting of cardiogenic               shock/Takotsubo CM.   CAP (community acquired pneumonia)                             Comment:a. 10/2012 - will need  f/u CXR in June 2014 to  ensure clearance.   Esophageal stricture                                           Comment:a. s/p dilitation 10/2012.   Pleural effusion, right                                        Comment:a. s/p thoracentesis 10/28/12.   Atrial fibrillation ([place])                                      Comment:a. Dx 10/2012, rate controlled, started on               Xarelto.   Claudication ([place])                                             Comment:a. ABI 10/2012 - R 0.87, L 0.74.   GERD (gastroesophageal reflux disease)                       CKD (chronic kidney disease), stage III                      Breast CA (HCC)                                                Comment:hx   Abdominal hernia                                               Comment:chronic   STEMI (ST elevation myocardial infarction) (HC*              Cellulitis                                                   CHF (congestive heart failure) (HCC)                         CAD (coronary artery disease)                                  Comment:a. 10/2012 - presented w/ STEMI felt due to               Takotsubo - mild nonobstructive dz by cath.  Past Surgical History:   MASTECTOMY  CATARACT EXTRACTION                                           ESOPHAGOGASTRODUODENOSCOPY                      N/A 11/05/2012      Comment:Procedure: ESOPHAGOGASTRODUODENOSCOPY (EGD)               with esophageal savary dilatation;  Surgeon:               Missy Sabins, MD;  Location: [place];                Service: Endoscopy;  Laterality: N/A;  savary               dil...no floro needed   bladder surgery                                               LEFT HEART CATHETERIZATION WITH CORONARY ANGIO* N/A 10/24/2012      Comment:Procedure: LEFT HEART CATHETERIZATION WITH               CORONARY ANGIOGRAM;  Surgeon: Sherren Mocha,               MD;  Location: [facility] C[facility] LAB;  Service:                Cardiovascular;  Laterality: N/A;   PERIPHERAL VASCULAR CATHETERIZATION             N/A 02/07/2015      Comment:Procedure: Abdominal Aortogram w/Lower               Extremity;  Surgeon: Katha Cabal, MD;                Location: [place] CV LAB;  Service:               Cardiovascular;  Laterality: N/A;   PERIPHERAL VASCULAR CATHETERIZATION              02/07/2015      Comment:Procedure: Lower Extremity Intervention;                Surgeon: Katha Cabal, MD;  Location: [place] CV LAB;  Service: Cardiovascular;;   CORONARY ANGIOPLASTY                                         BMI    Body Mass Index   24.45 kg/m 2    81 y.o. female with h/o mild nonobstructive CAD by cardiac catheterization in 4967, chronic systolic CHF due to recurrent stress-induced cardiomyopathy, chronic Afib on Xarelto, PAD s/p SFA stenting and left popliteal angioplasty in August 2016 by Dr. Ronalee Belts, MD, chronic respiratory failure 2/2 COPD on home oxygen, CKD stage II-III, HTN, and GERD who presented to [facility] on 10/31 with increased SOB and was found to have acute on chronic respiratory failure felt to be secondary to likely COPD exacerbation  Reproductive/Obstetrics negative OB ROS  Anesthesia Physical Anesthesia Plan  ASA: IV  Anesthesia Plan: General   Post-op Pain Management:    Induction:   Airway Management Planned:   Additional Equipment:   Intra-op Plan:   Post-operative Plan:   Informed Consent:   Dental Advisory Given  Plan Discussed with: Anesthesiologist, CRNA and Surgeon  Anesthesia Plan Comments:         Anesthesia Quick Evaluation

## 2015-04-11 NOTE — Progress Notes (Signed)
OT Cancellation Note  Patient Details Name: [name] [name] MRN: [id number] DOB: 18-Nov-1922   Cancelled Treatment:    Reason Eval/Treat Not Completed: Patient at procedure or test/ unavailable.  Sharon Mt 04/11/2015, 10:39 AM

## 2015-04-11 NOTE — Patient Outreach (Signed)
[place] [facility]) Care Management  04/11/2015  [name] [name] 03-10-1923 [id number]   Referral from Joylene Draft, RN to assign Community RN, assigned Merlene Morse Minor, RN.  Thanks, Ronnell [place]water. [place], [place] Assistant Phone: [phone] Fax: [phone] ;

## 2015-04-11 NOTE — Evaluation (Signed)
Physical Therapy Evaluation Patient Details Name: [name] [name] MRN: [id number] DOB: March 26, 1923 Today's Date: 04/11/2015   History of Present Illness  Pt is a 81 yo female who was admitted to the hospital for SOB over the past week  Clinical Impression  Pt presents with hx of HTN, COPD, CAP, A-fib, GERD, CKD, MI, CHF, and CAD. Examination reveals that pt performs bed mobility at supervision, transfers at supervision, and 140 ft ambulation at [place]. Pt shows generally sound mobility with transfers and bed mobility. Her ambulation is slightly unsteady without AD, so a RW was trialed and she was able to ambulate with much more control and confidence. Her primary deficit appears to be her cardiopulmonary endurance, as she desaturates to 82% on 3L following 140 ft ambulation. To address minor gait deficits and her cardiopulmonary impairments, she will benefit from skilled PT as well as outpatient cardiopulmonary rehabilitation to return her to optimal PLOF and prevent future hospital readmission.    Follow Up Recommendations Home health PT;Supervision - Intermittent;Other (comment) (Cardiopulmonary rehabilitation - [facility])    Clinical biochemist with 5" wheels    Recommendations for Other Services       Precautions / Restrictions Precautions Precautions: Fall Restrictions Weight Bearing Restrictions: No      Mobility  Bed Mobility Overal bed mobility: Needs Assistance Bed Mobility: Supine to Sit     Supine to sit: Supervision     General bed mobility comments: Pt with good functional strength and use of hands to get herself to EOB. Needs no assist  Transfers Overall transfer level: Needs assistance Equipment used: None Transfers: Sit to/from Stand Sit to Stand: Supervision         General transfer comment: Pt requires no physical assist for transfers. She rises and lowers herself with good control  Ambulation/Gait Ambulation/Gait assistance: Min  guard Ambulation Distance (Feet): 140 Feet Assistive device: None;Rolling walker (2 wheeled) Gait Pattern/deviations: Step-to pattern;Decreased step length - right;Decreased step length - left;Shuffle Gait velocity: slightly decreased   General Gait Details: Pt ambulated without AD and with RW. Without AD she is slightly unsteady and reaches for things to stabilize her. With her RW her gait velocity increases and she becomes more steady. Pt resting SaO2 on 3 L was 98%. following ambulation her SaO2 was 82%. It took about 45 seconds for it to return to 94%. Pt only reports minor fatigue (On 3L O2 [place])  Stairs            Wheelchair Mobility    Modified Rankin (Stroke Patients Only)       Balance Overall balance assessment: No apparent balance deficits (not formally assessed) (minor unsteadiness in ambulation without RW)                                           Pertinent Vitals/Pain Pain Assessment: No/denies pain    Home Living Family/patient expects to be discharged to:: Private residence Living Arrangements: Alone Available Help at Discharge: Friend(s);Available PRN/intermittently Type of Home: House Home Access: Level entry     Home Layout: One level Home Equipment: Cane - single point      Prior Function Level of Independence: Independent         Comments: Pt was indep with household ambulation and ADLs.      Hand Dominance        Extremity/Trunk Assessment  Upper Extremity Assessment: Overall WFL for tasks assessed           Lower Extremity Assessment: Overall WFL for tasks assessed         Communication   Communication: No difficulties  Cognition Arousal/Alertness: Awake/alert Behavior During Therapy: WFL for tasks assessed/performed Overall Cognitive Status: Within Functional Limits for tasks assessed                      General Comments      Exercises Other Exercises Other Exercises: Pt performed  bilateral therex x 12 reps at supervision for proper technique. Exercises included: ankle pumps, open chain leg press, SLR, hip abd, and LAQ, standing hip abd, and standing hip ext.       Assessment/Plan    PT Assessment Patient needs continued PT services  PT Diagnosis Abnormality of gait;Other (comment) (cardiopulmonary impairments)   PT Problem List Decreased activity tolerance;Cardiopulmonary status limiting activity  PT Treatment Interventions DME instruction;Gait training;Stair training;Functional mobility training;Therapeutic activities;Therapeutic exercise;Balance training;Neuromuscular re-education   PT Goals (Current goals can be found in the Care Plan section) Acute Rehab PT Goals Patient Stated Goal: to return home PT Goal Formulation: With patient Time For Goal Achievement: 04/25/15 Potential to Achieve Goals: Good    Frequency Min 2X/week   Barriers to discharge        Co-evaluation               End of Session Equipment Utilized During Treatment: Gait belt;Oxygen Activity Tolerance: Patient tolerated treatment well Patient left: in bed;Other (comment) (Left with transport staff for procedure) Nurse Communication: Mobility status         Time: 0920-0950 PT Time Calculation (min) (ACUTE ONLY): 30 min   Charges:         PT G CodesJanyth Contes 04/23/2015, 1:45 PM Janyth Contes, SPT. [phone]

## 2015-04-12 ENCOUNTER — Inpatient Hospital Stay (HOSPITAL_COMMUNITY)
Admit: 2015-04-12 | Discharge: 2015-04-12 | Disposition: A | Discharge status: Home / Self Care | Payer: Medicare Other | Attending: Physician Assistant | Admitting: Physician Assistant

## 2015-04-12 DIAGNOSIS — I509 Heart failure, unspecified: Secondary | ICD-10-CM

## 2015-04-12 LAB — BASIC METABOLIC PANEL
ANION GAP: 3 — AB (ref 5–15)
BUN: 34 mg/dL — AB (ref 6–20)
CO2: 37 mmol/L — AB (ref 22–32)
Calcium: 8.6 mg/dL — ABNORMAL LOW (ref 8.9–10.3)
Chloride: 96 mmol/L — ABNORMAL LOW (ref 101–111)
Creatinine, Ser: 1.19 mg/dL — ABNORMAL HIGH (ref 0.44–1.00)
GFR calc Af Amer: 45 mL/min — ABNORMAL LOW (ref >60)
GFR calc non Af Amer: 39 mL/min — ABNORMAL LOW (ref >60)
GLUCOSE: 176 mg/dL — AB (ref 65–99)
POTASSIUM: 4.5 mmol/L (ref 3.5–5.1)
Sodium: 136 mmol/L (ref 135–145)

## 2015-04-12 LAB — TROPONIN I: Troponin I: <0.03 ng/mL (ref <0.031)

## 2015-04-12 MED ORDER — BUDESONIDE-FORMOTEROL FUMARATE 160-4.5 MCG/ACT IN AERO
2.0000 | INHALATION_SPRAY | Freq: Two times a day (BID) | RESPIRATORY_TRACT | Status: DC
  Administered 2015-04-12 – 2015-04-14 (×5): 2 via RESPIRATORY_TRACT
  Filled 2015-04-12: qty 6

## 2015-04-12 MED ORDER — PREDNISONE 50 MG PO TABS
60.0000 mg | ORAL_TABLET | Freq: Every day | ORAL | Status: DC
  Administered 2015-04-13 – 2015-04-14 (×2): 60 mg via ORAL
  Filled 2015-04-12 (×2): qty 1

## 2015-04-12 NOTE — Progress Notes (Signed)
Date: 04/12/2015,   MRN# [id number] [name] [name] 07/24/1922 Code Status:     Code Status Orders        Start     Ordered   04/10/15 1811  Full code   Continuous     04/10/15 1810     Hosp day:@LENGTHOFSTAYDAYS @ Referring MD: @ATDPROV @      HPI: Sitting up in chair, [place] 02 on at 3 liters. C/O more sob, had left chest pain, resolve with nitro subl, cardiology following     Past Medical History  Diagnosis Date  . HTN (hypertension)   . COPD (chronic obstructive pulmonary disease) (HCC)     a. on home O2 nightly.  . Diverticulosis   . Takotsubo cardiomyopathy 10/2012    a. 10/2012: presented w/ cardiogenic shock/systolic CHF/VDRF - EF 39% by cath, 40% by f/u echo - cath with mild nonobstructive CAD. b. Not discharged on ACEI due to hypotension.  . Cardiogenic shock ([place])     a. 10/2012 - due to Takotsubo CM.  [name] Respiratory failure ([place])     a. On home O2 nightly. b. VDRF 10/2012 - due to CAP/pulm edema in setting of cardiogenic shock/Takotsubo CM.  [name] CAP (community acquired pneumonia)     a. 10/2012 - will need f/u CXR in June 2014 to ensure clearance.  . Esophageal stricture     a. s/p dilitation 10/2012.  [name] Pleural effusion, right     a. s/p thoracentesis 10/28/12.  . Atrial fibrillation ([place])     a. Dx 10/2012, rate controlled, started on Xarelto.  . Claudication ([place])     a. ABI 10/2012 - R 0.87, L 0.74.  [name] GERD (gastroesophageal reflux disease)   . CKD (chronic kidney disease), stage III   . Breast CA (HCC)     hx  . Abdominal hernia     chronic  . STEMI (ST elevation myocardial infarction) ([place])   . Cellulitis   . CHF (congestive heart failure) ([place])   . CAD (coronary artery disease)     a. 10/2012 - presented w/ STEMI felt due to Takotsubo - mild nonobstructive dz by cath.   Surgical Hx:  Past Surgical History  Procedure Laterality Date  . Mastectomy    . Cataract extraction    . Esophagogastroduodenoscopy N/A 11/05/2012    Procedure: ESOPHAGOGASTRODUODENOSCOPY (EGD)  with esophageal savary dilatation;  Surgeon: Missy Sabins, MD;  Location: [facility];  Service: Endoscopy;  Laterality: N/A;  savary dil...no floro needed  . Bladder surgery    . Left heart catheterization with coronary angiogram N/A 10/24/2012    Procedure: LEFT HEART CATHETERIZATION WITH CORONARY ANGIOGRAM;  Surgeon: Sherren Mocha, MD;  Location: [facility] CATH LAB;  Service: Cardiovascular;  Laterality: N/A;  . Peripheral vascular catheterization N/A 02/07/2015    Procedure: Abdominal Aortogram w/Lower Extremity;  Surgeon: Katha Cabal, MD;  Location: [facility] CV LAB;  Service: Cardiovascular;  Laterality: N/A;  . Peripheral vascular catheterization  02/07/2015    Procedure: Lower Extremity Intervention;  Surgeon: Katha Cabal, MD;  Location: [facility] CV LAB;  Service: Cardiovascular;;  . Coronary angioplasty     Family Hx:  Family History  Problem Relation Age of Onset  . Heart attack Mother    Social Hx:   Social History  Substance Use Topics  . Smoking status: Former Smoker -- 1.00 packs/day for 55 years    Types: Cigarettes  . Smokeless tobacco: Former Systems developer  . Alcohol Use: No   Medication:  Home Medication:  No current outpatient prescriptions on file.  Current Medication: @CURMEDTAB @   Allergies:  Penicillins; Levaquin; and Sulfa antibiotics  Review of Systems: Gen:  Denies  fever, sweats, chills HEENT: Denies blurred vision, double vision, ear pain, eye pain, hearing loss, nose bleeds, sore throat Cvc:  No dizziness, chest pain or heaviness Resp: Feeling more sob today   Gi: Denies swallowing difficulty, stomach pain, nausea or vomiting, diarrhea, constipation, bowel incontinence Gu:  Denies bladder incontinence, burning urine Ext:   No Joint pain, stiffness or swelling Skin: No skin rash, easy bruising or bleeding or hives Endoc:  No polyuria, polydipsia , polyphagia or weight change Psych: No depression, insomnia or hallucinations  Other:  All  other systems negative  Physical Examination:   VS: BP 128/51 mmHg  Pulse 63  Temp(Src) 97.6 F (36.4 C) (Oral)  Resp 18  Ht 5\' 7"  (1.702 m)  Wt 160 lb 8 oz (72.802 kg)  BMI 25.13 kg/m2  SpO2 100%  General Appearance: No distress  Neuro: without focal findings, mental status, speech normal, alert and oriented, cranial nerves 2-12 intact, reflexes normal and symmetric, sensation grossly normal  HEENT: PERRLA, EOM intact, no ptosis, no other lesions noticed, Mallampati: Pulmonary:.No wheezing, No rales  Sputum Production:   Cardiovascular:  Normal S1,S2.  No m/r/g.  Abdominal aorta pulsation normal.    Abdomen:Benign, Soft, non-tender, No masses, hepatosplenomegaly, No lymphadenopathy Endoc: No evident thyromegaly, no signs of acromegaly or Cushing features Skin:   warm, no rashes, no ecchymosis  Extremities: normal, no cyanosis, clubbing, no edema, warm with normal capillary refill. Other findings:   Labs results:   Recent Labs     04/10/15  1409  04/11/15  0408  04/12/15  0408  HGB  11.4*  11.6*   --   HCT  34.9*  33.8*   --   MCV  94.2  93.3   --   WBC  7.1  7.9   --   BUN  17  25*  34*  CREATININE  1.04*  1.60*  1.19*  GLUCOSE  143*  250*  176*  CALCIUM  9.0  8.9  8.6*  ,    Rad results:        Assessment and Plan: Copd exacerbation,seem to be struggling a little more today Continue  spiriva, albuterol, steroid taper, o2 Switch dulera to symbicort  Possible early LLL pneumonia vs atelactasis Agree with empiric antibiotic regimen chosen If possible incentive spirometry Repeat chest xray 10-14 days as an out patient  Hx of chf, ? Mild chf on admission, presently not a major issue Continue cardiology recs watch salt intake   I have personally obtained a history, examined the patient, evaluated laboratory and imaging results, formulated the assessment and plan and placed orders.  The Patient requires high complexity decision making for assessment and  support, frequent evaluation and titration of therapies, application of advanced monitoring technologies and extensive interpretation of multiple databases.   Ioan Landini,M.D. Pulmonary & Critical care Medicine [facility]

## 2015-04-12 NOTE — Care Management (Addendum)
Pulmonary has documented that [facility] will be switched to Symbicort.  Patient says she has been on this inhaler at an ealier time.  Discussed it could be costly.  CM will check with patient's pharmacy- [facility] [place] she has never used a coupon for this med.  CSW referral ordered as part of the COPD Gold protocol.  Informed CSW that patient has declined  need for assessment.  At present she feels she is coping adequately with management of disease process and has strong support of her family.  She has a walker at home and her daughter in laws check on her frequently.

## 2015-04-12 NOTE — Progress Notes (Signed)
Physical Therapy Treatment Patient Details Name: [name] [name] MRN: [id number] DOB: 1922-07-22 Today's Date: 04/12/2015    History of Present Illness This patient is a 81 year old female who came to [facility] ED with shortness of breath.    PT Comments    Pt improving with ambulation distance/endurance and good SaO2 response. Pt ambulated on 3L O2 [place] and never fell below 93%, and also expressed only moderate fatigue with no chest pain during any mobility. She remains fairly strong and confident with bed mobility and transfers and has generally good balance. Due to her slight gait deficits and decreased activity tolerance, she will benefit from both HHPT for a safe return home and return to baseline, but also pulmonary rehabilitation for better activity tolerance given her cardiopulmonary status.   Follow Up Recommendations  Home health PT;Supervision - Intermittent;Other (comment) (pulmonary rehabilitation  (possibly [facility]))     Clinical biochemist with 5" wheels    Recommendations for Other Services       Precautions / Restrictions Precautions Precautions: Fall Restrictions Weight Bearing Restrictions: No    Mobility  Bed Mobility Overal bed mobility: Needs Assistance Bed Mobility: Supine to Sit     Supine to sit: Supervision     General bed mobility comments: Pt with good functional strength and use of hands to get herself to EOB. Needs no assist  Transfers Overall transfer level: Needs assistance Equipment used: Rolling walker (2 wheeled);None Transfers: Sit to/from Stand Sit to Stand: Supervision         General transfer comment: Pt requires no physical assist for transfers. She rises and lowers herself with good control  Ambulation/Gait Ambulation/Gait assistance: Min guard Ambulation Distance (Feet): 220 Feet Assistive device: None;Rolling walker (2 wheeled) Gait Pattern/deviations: Step-to pattern;Decreased step length - right;Decreased  step length - left;Shuffle Gait velocity: slightly decreased Gait velocity interpretation: Below normal speed for age/gender General Gait Details: Pt ambulates without AD for first 1/5th of ambulation but she still shows shuffle pattern and decreased gait speed without it. She also searches for things to hold on to. With RW her gait speed increases and some of the deviations subside   Stairs            Wheelchair Mobility    Modified Rankin (Stroke Patients Only)       Balance Overall balance assessment: No apparent balance deficits (not formally assessed) (Slight unsteadiness without RW during ambulation)                                  Cognition Arousal/Alertness: Awake/alert Behavior During Therapy: WFL for tasks assessed/performed Overall Cognitive Status: Within Functional Limits for tasks assessed                      Exercises      General Comments        Pertinent Vitals/Pain Pain Assessment: No/denies pain    Home Living Family/patient expects to be discharged to:: Private residence Living Arrangements: Alone Available Help at Discharge: Friend(s);Available PRN/intermittently Type of Home: House Home Access: Level entry   Home Layout: One level Home Equipment: Cane - single point Additional Comments: only recently started using cane.    Prior Function Level of Independence: Independent (basic ADL. Has cleaning lady come in every so often.)          PT Goals (current goals can now be found in the  care plan section) Acute Rehab PT Goals Patient Stated Goal: To go home PT Goal Formulation: With patient Time For Goal Achievement: 04/25/15 Potential to Achieve Goals: Good Progress towards PT goals: Progressing toward goals    Frequency  Min 2X/week    PT Plan Current plan remains appropriate    Co-evaluation             End of Session Equipment Utilized During Treatment: Gait belt;Oxygen Activity Tolerance:  Patient tolerated treatment well Patient left: in chair;with call bell/phone within reach;with chair alarm set     Time: 1359-1410 PT Time Calculation (min) (ACUTE ONLY): 11 min  Charges:                       G CodesJanyth Contes 2015/04/27, 5:15 PM  Janyth Contes, SPT. [phone]

## 2015-04-12 NOTE — Progress Notes (Signed)
[place] [facility] [place] NAME: [name] [name]    MR#:  [id number]  DATE OF BIRTH:  10/15/1922  SUBJECTIVE:  Patient had tightness of chest and wheezing this am not feeling as good this am  REVIEW OF SYSTEMS:    Review of Systems  Constitutional: Negative for fever, chills and malaise/fatigue.  HENT: Negative for sore throat.   Eyes: Negative for blurred vision.  Respiratory: Positive for shortness of breath and wheezing. Negative for cough, hemoptysis and sputum production.   Cardiovascular: Negative for chest pain, palpitations and leg swelling.  Gastrointestinal: Negative for nausea, vomiting, abdominal pain, diarrhea and blood in stool.  Genitourinary: Negative for dysuria.  Musculoskeletal: Negative for back pain.  Neurological: Negative for dizziness, tremors and headaches.  Endo/Heme/Allergies: Does not bruise/bleed easily.    Tolerating Diet: yes     DRUG ALLERGIES:   Allergies  Allergen Reactions  . Penicillins Itching, Swelling, Rash and Other (See Comments)    Pt is unable to answer additional questions about this medication because it happened so long ago.  [name] [Levofloxacin In D5w]   . Sulfa Antibiotics Other (See Comments)    Reaction:  Unknown     VITALS:  Blood pressure 112/50, pulse 70, temperature 98 F (36.7 C), temperature source Oral, resp. rate 18, height 5\' 7"  (1.702 m), weight 72.802 kg (160 lb 8 oz), SpO2 100 %.  PHYSICAL EXAMINATION:   Physical Exam  Constitutional: She is oriented to person, place, and time and well-developed, well-nourished, and in no distress. No distress.  HENT:  Head: Normocephalic.  Eyes: No scleral icterus.  Neck: Normal range of motion. Neck supple. No JVD present. No tracheal deviation present.  Cardiovascular: Normal rate, regular rhythm and normal heart sounds.  Exam reveals no gallop and no friction rub.   No murmur heard. Pulmonary/Chest: Effort normal and  breath sounds normal. No respiratory distress. She has no wheezes. She has no rales. She exhibits no tenderness.  Abdominal: Soft. Bowel sounds are normal. She exhibits no distension and no mass. There is no tenderness. There is no rebound and no guarding.  Musculoskeletal: Normal range of motion. She exhibits no edema.  Neurological: She is alert and oriented to person, place, and time.  Skin: Skin is warm. No rash noted. No erythema.  Psychiatric: Affect and judgment normal.      LABORATORY PANEL:   CBC  Recent Labs Lab 04/11/15 0408  WBC 7.9  HGB 11.6*  HCT 33.8*  PLT 218   ------------------------------------------------------------------------------------------------------------------  Chemistries   Recent Labs Lab 04/12/15 0408  NA 136  K 4.5  CL 96*  CO2 37*  GLUCOSE 176*  BUN 34*  CREATININE 1.19*  CALCIUM 8.6*   ------------------------------------------------------------------------------------------------------------------  Cardiac Enzymes  Recent Labs Lab 04/10/15 1409 04/12/15 0016  TROPONINI <0.03 <0.03   ------------------------------------------------------------------------------------------------------------------  RADIOLOGY:  Dg Chest 1 View  04/10/2015  CLINICAL DATA:  Shortness of breath, COPD. EXAM: CHEST 1 VIEW COMPARISON:  CT scan of the chest of March 07, 2015 FINDINGS: The lungs are adequately inflated. There is stable apical pleural thickening bilaterally. Subtle increased density at the left lung base is present with partial obscuration of the hemidiaphragm. There is no significant pleural effusion. The heart is top-normal in size. The central pulmonary vascularity is prominent but stable. The mediastinum is normal in width. The bony thorax exhibits no acute abnormality. IMPRESSION: Mild atelectasis or early pneumonia in the left lower lobe. There may be a  trace of pleural fluid. There is no pulmonary edema. A PA and lateral chest  x-ray would be useful when the patient can tolerate the procedure. Electronically Signed   By: David  Martinique M.D.   On: 04/10/2015 14:12     ASSESSMENT AND PLAN:   81 year old female with a history of mild nonobstructive CAD, chronic respiratory failure on 2 and half liters of oxygen for COPD and chronic systolic heart failure due to recurrent stress-induced cardiomyopathy, chronic atrial fibrillation on the relative who presented with shortness of breath.  1. Acute on chronic respiratory hypoxic : This is likely due to a combination of COPD exacerbation with very mild acute on chronic systolic heart failure and pneumonia. Patient is currently on her baseline oxygen. She is on Rocephin and azithromycin for pneumonia. She is on steroids for COPD exacerbation and small dose of IV Lasix was given  for mild CHF exacerbation.  2. Acute COPD exacerbation: She sounds more tight this am. Continue antibiotics, oxygen, nebulizer and inhaler treatment. She is allergic to SOLUMEDROL so continue prednisone. Pulmonary following.   3. Acute on chronic systolic heart failure EF 40%: [facility] cardiology consultation.Patient was given 1 dose of 40 mg of IV Lasix. Patient will continue Coreg and oral Lasix dose  4. Hypokalemia: Potassium will be repleted PRN.  5. Chronic atrial fibrillation: Her heart rate is controlled. AC started as patient not planned for EGD 6. Dysphagia: No plans for EGD given respiratory issues  7. Essential hypertension: Blood pressures controlled. Continue Coreg  8. Brief chest pain: not ACS, trops are negative  Management plans discussed with the patient and she is in agreement.  CODE STATUS: FULL   TOTAL TIME TAKING CARE OF THIS PATIENT: 28 minutes.     POSSIBLE D/C tomorrow, DEPENDING ON CLINICAL CONDITION.   Beecher Furio M.D on 04/12/2015 at 11:01 AM  Between 7am to 6pm - Pager - [phone] After 6pm go to [email or web address] - password [email or web address] [place]  [facility]  Office  [phone]  CC: Primary care physician; [name], MD  Note: This dictation was prepared with Dragon dictation along with smaller phrase technology. Any transcriptional errors that result from this process are unintentional.

## 2015-04-12 NOTE — Evaluation (Signed)
Occupational Therapy Evaluation Patient Details Name: CHERESE LOZANO MRN: [id number] DOB: 1923-05-04 Today's Date: 04/12/2015    History of Present Illness This patient is a 81 year old female who came to [facility] ED with shortness of breath.   Clinical Impression   This patient is a 81 year old female who came to [facility] with shortness of breat. Sh also has hypertension, COPD, a-fib, GERD, CHF. She lives alone with some outside help and has independent with basic ADL and functional mobility using single point cane. Her house is single story and entry level. She would benefit from 2 or 3 Occupational Therapy visits to reinforce energy saving techniques.    Follow Up Recommendations       Equipment Recommendations       Recommendations for Other Services       Precautions / Restrictions Precautions Precautions: Fall Restrictions Weight Bearing Restrictions: No      Mobility Bed Mobility                  Transfers                      Balance                                            ADL                                         General ADL Comments: Patient had been independent with ADL. uses 02 at home. She does become shortness of breath during lower body dressing. Instructed and practiced lower body dressing techniques with hip kit to avoid shortness of breath. Patient's foot sticks to sock aid so suggested powder. Reviewed energy saving techniques during activities of daily living. Issued list of venders.     Vision     Perception     Praxis      Pertinent Vitals/Pain Pain Assessment: No/denies pain     Hand Dominance Right   Extremity/Trunk Assessment Upper Extremity Assessment Upper Extremity Assessment: Generalized weakness   Lower Extremity Assessment Lower Extremity Assessment: Defer to PT evaluation       Communication Communication Communication: No difficulties    Cognition Arousal/Alertness: Awake/alert Behavior During Therapy: WFL for tasks assessed/performed Overall Cognitive Status: Within Functional Limits for tasks assessed                     General Comments       Exercises       Shoulder Instructions      Home Living Family/patient expects to be discharged to:: Private residence Living Arrangements: Alone Available Help at Discharge: Friend(s);Available PRN/intermittently Type of Home: House Home Access: Level entry     Home Layout: One level     Bathroom Shower/Tub: Teacher, early years/pre: Standard     Home Equipment: Cane - single point   Additional Comments: only recently started using cane.      Prior Functioning/Environment Level of Independence: Independent (basic ADL. Has cleaning lady come in every so often.)             OT Diagnosis: Generalized weakness   OT Problem List: Decreased strength;Decreased activity tolerance;Decreased knowledge of use of DME or  AE   OT Treatment/Interventions: Self-care/ADL training    OT Goals(Current goals can be found in the care plan section) Acute Rehab OT Goals Patient Stated Goal: To go home OT Goal Formulation: With patient Time For Goal Achievement: 04/26/15 Potential to Achieve Goals: Good  OT Frequency: Min 1X/week   Barriers to D/C:            Co-evaluation              End of Session Equipment Utilized During Treatment:  (Hip kit)  Activity Tolerance:   Patient left: in chair;with call bell/phone within reach;with bed alarm set   Time: 6294-7654 OT Time Calculation (min): 23 min Charges:  OT General Charges $OT Visit: 1 Procedure OT Evaluation $Initial OT Evaluation Tier I: 1 Procedure OT Treatments $Self Care/Home Management : 8-22 mins G-Codes:    Myrene Galas, MS/OTR/L  04/12/2015, 2:03 PM

## 2015-04-12 NOTE — Progress Notes (Signed)
Administered one dose of SL Nitro at [employer]. At 0007 after 5 minutes, patient stated pain had decreased and gave a pain score of 3/10. Within another five minutes, patient stated her pain had decreased further. Nursing staff will continue to monitor. Earleen Reaper, RN

## 2015-04-12 NOTE — Progress Notes (Signed)
Initial appointment made at the [facility] on May 03, 2015 at 11:00am. Thank you.

## 2015-04-12 NOTE — Progress Notes (Signed)
   04/12/15 1030  Clinical Encounter Type  Visited With Patient  Visit Type Follow-up  Referral From Nurse  Consult/Referral To Chaplain  Spiritual Encounters  Spiritual Needs Literature;Prayer  Stress Factors  Patient Stress Factors Exhausted;Health changes  Advance Directives (For Healthcare)  Does patient have an advance directive? No  Would patient like information on creating an advanced directive? Yes - Scientist, clinical (histocompatibility and immunogenetics) given  Met w/patient & provided AD documents & education. Pt. plans to discuss w/daughter-in-law tonight and make decision tomorrow.  [place]. [place] G. [place]

## 2015-04-12 NOTE — Progress Notes (Signed)
Patient: [name] [name] / Admit Date: 04/10/2015 / Date of Encounter: 04/12/2015, 8:01 AM   Subjective: She did not undergo EGS on 11/1 secondary to her acute medical condition. She continues to have issues with solid foods, though can swallow soft foods ok. She is minus 1310 mL for the admission. Short episode of "quick, sharp" chest pain around midnight overnight. Troponin negative. Scr improving from 1.60-->1.19.     Review of Systems: Review of Systems  Constitutional: Positive for malaise/fatigue. Negative for fever, chills, weight loss and diaphoresis.  HENT: Negative for congestion.   Eyes: Negative for discharge and redness.  Respiratory: Positive for cough, shortness of breath and wheezing. Negative for hemoptysis and sputum production.   Cardiovascular: Positive for chest pain. Negative for palpitations, orthopnea, claudication, leg swelling and PND.  Gastrointestinal: Negative for heartburn, nausea, vomiting and abdominal pain.  Musculoskeletal: Negative for falls.  Skin: Negative for rash.  Neurological: Positive for weakness. Negative for dizziness, tingling, tremors, sensory change, speech change, focal weakness and loss of consciousness.  Endo/Heme/Allergies: Does not bruise/bleed easily.  Psychiatric/Behavioral: The patient is nervous/anxious.     Objective: Telemetry: Afib Physical Exam: Blood pressure 109/43, pulse 67, temperature 98 F (36.7 C), temperature source Oral, resp. rate 18, height 5\' 7"  (1.702 m), weight 160 lb 8 oz (72.802 kg), SpO2 100 %. Body mass index is 25.13 kg/(m^2). General: Well developed, well nourished, in no acute distress. Head: Normocephalic, atraumatic, sclera non-icteric, no xanthomas, nares are without discharge. Neck: Negative for carotid bruits. JVP not elevated. Lungs: Decreased breath sounds bilaterally with rhonchi left lower lobe. Breathing is unlabored. Heart: Irregularly-irregular, S1 S2 without murmurs, rubs, or gallops. Chest  pain is reproducible to palpation.  Abdomen: Soft, non-tender, non-distended with normoactive bowel sounds. No rebound/guarding. Extremities: No clubbing or cyanosis. No edema. Distal pedal pulses are 2+ and equal bilaterally. Neuro: Alert and oriented X 3. Moves all extremities spontaneously. Psych:  Responds to questions appropriately with a normal affect.   Intake/Output Summary (Last 24 hours) at 04/12/15 0801 Last data filed at 04/12/15 0032  Gross per 24 hour  Intake    240 ml  Output    450 ml  Net   -210 ml    Inpatient Medications:  . azithromycin  500 mg Oral Daily  . carvedilol  6.25 mg Oral BID WC  . cefTRIAXone (ROCEPHIN)  IV  1 g Intravenous Q24H  . docusate sodium  100 mg Oral BID  . furosemide  40 mg Oral BID  . gabapentin  300 mg Oral TID  . loratadine  10 mg Oral Daily  . mometasone-formoterol  2 puff Inhalation BID  . pantoprazole  40 mg Oral Daily  . potassium chloride  20 mEq Oral BID  . predniSONE  50 mg Oral Q breakfast  . pyridOXINE  100 mg Oral Daily  . Rivaroxaban  15 mg Oral QHS  . sodium chloride  3 mL Intravenous Q12H  . tiotropium  18 mcg Inhalation Daily  . vitamin B-12  1,000 mcg Oral Daily   Infusions:    Labs:  Recent Labs  04/11/15 0408 04/12/15 0408  NA 139 136  K 3.8 4.5  CL 93* 96*  CO2 37* 37*  GLUCOSE 250* 176*  BUN 25* 34*  CREATININE 1.60* 1.19*  CALCIUM 8.9 8.6*   No results for input(s): AST, ALT, ALKPHOS, BILITOT, PROT, ALBUMIN in the last 72 hours.  Recent Labs  04/10/15 1409 04/11/15 0408  WBC 7.1 7.9  NEUTROABS 4.2  --   HGB 11.4* 11.6*  HCT 34.9* 33.8*  MCV 94.2 93.3  PLT 240 218    Recent Labs  04/10/15 1409 04/12/15 0016  TROPONINI <0.03 <0.03   Invalid input(s): POCBNP No results for input(s): HGBA1C in the last 72 hours.   Weights: Filed Weights   04/10/15 1359 04/11/15 0501 04/12/15 0442  Weight: 161 lb 3.2 oz (73.12 kg) 156 lb 3.2 oz (70.852 kg) 160 lb 8 oz (72.802 kg)      Radiology/Studies:  Dg Chest 1 View  04/10/2015  CLINICAL DATA:  Shortness of breath, COPD. EXAM: CHEST 1 VIEW COMPARISON:  CT scan of the chest of March 07, 2015 FINDINGS: The lungs are adequately inflated. There is stable apical pleural thickening bilaterally. Subtle increased density at the left lung base is present with partial obscuration of the hemidiaphragm. There is no significant pleural effusion. The heart is top-normal in size. The central pulmonary vascularity is prominent but stable. The mediastinum is normal in width. The bony thorax exhibits no acute abnormality. IMPRESSION: Mild atelectasis or early pneumonia in the left lower lobe. There may be a trace of pleural fluid. There is no pulmonary edema. A PA and lateral chest x-ray would be useful when the patient can tolerate the procedure. Electronically Signed   By: David  Martinique M.D.   On: 04/10/2015 14:12     Assessment and Plan  81 y.o. female with h/o mild nonobstructive CAD by cardiac catheterization in 7096, chronic systolic CHF due to recurrent stress-induced cardiomyopathy, chronic Afib on Xarelto, PAD s/p SFA stenting and left popliteal angioplasty in August 2016 by Dr. Ronalee Belts, COPD on home oxygen nightly, CKD stage II-III, HTN, and GERD who presented to [facility] on 10/31 with increased SOB and was found to have acute on chronic respiratory failure felt to be secondary to likely COPD exacerbation with component of acute on chronic diastolic CHF.   1. Acute on chronic respiratory failure: -Possibly multifactorial including COPD exacerbation and possible acute on chronic systolic CHF -Oxygen via nasal cannula remains at 3L currently  -Continue to wean back to home level of 2-2.5L as tolerated  2. Mild acute on chronic systolic CHF: -Check echo to evaluate LV function and right right-sided pressure -Continue po Lasix 40 mg bid -Continue Coreg 6.25 mg bid -Limit IV fluids  3. COPD exacerbation: -Rocephin and  azithromycin per IM -Nebs per IM, consider changing albuterol to Xopenex given her Afib if needed for rate control -Wean oxygen as above  3. Chronic Afib: -Rate controlled -Coreg 6.25 mg bid -Back on Xarelto as of 11/1 as she was unable to undergo EGD per below  4. History of stress induced cardiomyopathy: -Check echo as above -Lasix as above -Limit IV fluids  5. Dysphagia: -Unable to undergo EGD 2/2 acute medical condition -Back on Xarelto as of 11/1 -Continue soft foods -Consider apple sauce with her pills   6. Hypokalemia: -Resolved  7. HTN: -Controlled -Continue current medications   Signed, Christell Faith, PA-C Pager: [phone] 04/12/2015, 8:01 AM

## 2015-04-12 NOTE — Care Management Important Message (Signed)
Important Message  Patient Details  Name: IDELL HISSONG MRN: [id number] Date of Birth: 10-13-1922   Medicare Important Message Given:  Yes-second notification given    Juliann Pulse A Jaquavius Hudler 04/12/2015, 10:09 AM

## 2015-04-12 NOTE — Clinical Social Work Note (Signed)
CSW consulted due to patient being a part of the COPD gold program. CSW consulted with RN CM and was informed that patient does currently not have a need for CSW at this time and has adjusted as well as can be expected to her COPD illness and intends to discharge home when time. Please reconsult CSW if the need arises. Shela Leff MSW,LCSW [phone]

## 2015-04-12 NOTE — Progress Notes (Signed)
*  PRELIMINARY RESULTS* Echocardiogram 2D Echocardiogram has been performed.  [name] [name] 04/12/2015, 2:55 PM

## 2015-04-12 NOTE — Discharge Instructions (Signed)
[facility] Failure [facility] appointment on May 03, 2015 at 11:00am with Darylene Price, [place]. Please call [phone] to reschedule.

## 2015-04-13 ENCOUNTER — Ambulatory Visit: Payer: Self-pay | Admitting: Family Medicine

## 2015-04-13 ENCOUNTER — Encounter: Payer: Self-pay | Admitting: Radiology

## 2015-04-13 ENCOUNTER — Inpatient Hospital Stay: Payer: Medicare Other

## 2015-04-13 LAB — BASIC METABOLIC PANEL
ANION GAP: 6 (ref 5–15)
BUN: 36 mg/dL — AB (ref 6–20)
CALCIUM: 9.1 mg/dL (ref 8.9–10.3)
CO2: 38 mmol/L — ABNORMAL HIGH (ref 22–32)
CREATININE: 1.08 mg/dL — AB (ref 0.44–1.00)
Chloride: 94 mmol/L — ABNORMAL LOW (ref 101–111)
GFR calc Af Amer: 51 mL/min — ABNORMAL LOW (ref >60)
GFR, EST NON AFRICAN AMERICAN: 44 mL/min — AB (ref >60)
GLUCOSE: 188 mg/dL — AB (ref 65–99)
Potassium: 4.9 mmol/L (ref 3.5–5.1)
SODIUM: 138 mmol/L (ref 135–145)

## 2015-04-13 LAB — GLUCOSE, CAPILLARY: Glucose-Capillary: 163 mg/dL — ABNORMAL HIGH (ref 65–99)

## 2015-04-13 LAB — CBC
HCT: 33.6 % — ABNORMAL LOW (ref 35.0–47.0)
Hemoglobin: 11 g/dL — ABNORMAL LOW (ref 12.0–16.0)
MCH: 31 pg (ref 26.0–34.0)
MCHC: 32.8 g/dL (ref 32.0–36.0)
MCV: 94.7 fL (ref 80.0–100.0)
PLATELETS: 232 10*3/uL (ref 150–440)
RBC: 3.55 MIL/uL — ABNORMAL LOW (ref 3.80–5.20)
RDW: 15.1 % — AB (ref 11.5–14.5)
WBC: 8.8 10*3/uL (ref 3.6–11.0)

## 2015-04-13 LAB — TROPONIN I

## 2015-04-13 MED ORDER — IPRATROPIUM-ALBUTEROL 0.5-2.5 (3) MG/3ML IN SOLN: 3.0000 mL | Freq: Once | RESPIRATORY_TRACT | Status: DC

## 2015-04-13 MED ORDER — IOHEXOL 350 MG/ML SOLN
75.0000 mL | Freq: Once | INTRAVENOUS | Status: AC | PRN
  Administered 2015-04-13: 75 mL via INTRAVENOUS

## 2015-04-13 NOTE — Progress Notes (Signed)
Patient remains alert and oriented, resting in bed at this time, denies any pain or discomfort, A fib on the monitor, denies chest pain , on oxygen 3L [place], VSS, will continue to monitor.

## 2015-04-13 NOTE — Plan of Care (Signed)
Rt upper arm IV infiltratedd in Rad (CT).  RN outlined and continues to elevate and apply ice 22min on 71min off.

## 2015-04-13 NOTE — Care Management (Signed)
Informed that anticipate discharge within next 24 hours.  Patient verbalizes that she is extremely fatigued and unsure why.  Will reach out to physical therapy today to determine if there would be any changed in recommendations.  At present, continue to anticipate dischrge home with home health and up to this point this is what patient has wanted.

## 2015-04-13 NOTE — Progress Notes (Signed)
[place] [facility] [place] NAME: [name] [name]    MR#:  [id number]  DATE OF BIRTH:  08-Feb-1923  SUBJECTIVE:  Patient feeling tired this am she is receiving gabepentin here but does not take at home  REVIEW OF SYSTEMS:    Review of Systems  Constitutional: Negative for fever, chills and malaise/fatigue.  HENT: Negative for sore throat.   Eyes: Negative for blurred vision.  Respiratory: Negative for cough, hemoptysis, sputum production and shortness of breath.   Cardiovascular: Negative for chest pain, palpitations and leg swelling.  Gastrointestinal: Negative for nausea, vomiting, abdominal pain, diarrhea and blood in stool.  Genitourinary: Negative for dysuria.  Musculoskeletal: Negative for back pain.  Neurological: Positive for weakness. Negative for dizziness, tremors and headaches.  Endo/Heme/Allergies: Does not bruise/bleed easily.    Tolerating Diet: yes     DRUG ALLERGIES:   Allergies  Allergen Reactions  . Penicillins Itching, Swelling, Rash and Other (See Comments)    Pt is unable to answer additional questions about this medication because it happened so long ago.  [name] [Levofloxacin In D5w]   . Sulfa Antibiotics Other (See Comments)    Reaction:  Unknown     VITALS:  Blood pressure 118/62, pulse 67, temperature 98 F (36.7 C), temperature source Oral, resp. rate 24, height 5\' 7"  (1.702 m), weight 71.94 kg (158 lb 9.6 oz), SpO2 98 %.  PHYSICAL EXAMINATION:   Physical Exam  Constitutional: She is oriented to person, place, and time and well-developed, well-nourished, and in no distress. No distress.  HENT:  Head: Normocephalic.  Eyes: No scleral icterus.  Neck: Normal range of motion. Neck supple. No JVD present. No tracheal deviation present.  Cardiovascular: Normal rate, regular rhythm and normal heart sounds.  Exam reveals no gallop and no friction rub.   No murmur heard. Pulmonary/Chest: Effort normal  and breath sounds normal. No respiratory distress. She has no wheezes. She has no rales. She exhibits no tenderness.  Abdominal: Soft. Bowel sounds are normal. She exhibits no distension and no mass. There is no tenderness. There is no rebound and no guarding.  Musculoskeletal: Normal range of motion. She exhibits no edema.  Neurological: She is alert and oriented to person, place, and time.  Skin: Skin is warm. No rash noted. No erythema.  Psychiatric: Affect and judgment normal.      LABORATORY PANEL:   CBC  Recent Labs Lab 04/13/15 0353  WBC 8.8  HGB 11.0*  HCT 33.6*  PLT 232   ------------------------------------------------------------------------------------------------------------------  Chemistries   Recent Labs Lab 04/13/15 0353  NA 138  K 4.9  CL 94*  CO2 38*  GLUCOSE 188*  BUN 36*  CREATININE 1.08*  CALCIUM 9.1   ------------------------------------------------------------------------------------------------------------------  Cardiac Enzymes  Recent Labs Lab 04/10/15 1409 04/12/15 0016  TROPONINI <0.03 <0.03   ------------------------------------------------------------------------------------------------------------------  RADIOLOGY:  No results found.   ASSESSMENT AND PLAN:   81 year old female with a history of mild nonobstructive CAD, chronic respiratory failure on 2 and half liters of oxygen for COPD and chronic systolic heart failure due to recurrent stress-induced cardiomyopathy, chronic atrial fibrillation on the relative who presented with shortness of breath.  1. Acute on chronic respiratory hypoxic : This is likely due to a combination of COPD exacerbation with very mild acute on chronic systolic heart failure and pneumonia. Patient is currently on her baseline oxygen. She is on Rocephin and azithromycin for pneumonia. She is on steroids for COPD exacerbation and small  dose of IV Lasix was given  for mild CHF exacerbation. I will  change her to oral azithromycin with plan for discharge in a.m.   2. Acute COPD exacerbation: Patient has improved. I will wean steroids. Continue her chronic oxygen, inhalers and nebulizer treatments.. Pulmonary following.   3. Acute on chronic diastolic heart failure EF 65 %: Appreciate cardiology consultation.Patient was given 1 dose of 40 mg of IV Lasix. Patient will continue Coreg and oral Lasix dose  4. Hypokalemia: Potassium will be repleted PRN.  5. Chronic atrial fibrillation: Her heart rate is controlled. AC restarted as patient not planned for EGD 6. Dysphagia: I spoke with Dr Allen Norris this morning. Patient is restarted on anticoagulation and therefore cannot have EGD performed. Patient will need a follow-up with Dr. Allen Norris after discharge with plan for EGD in the future.  7. Essential hypertension: Blood pressures controlled. Continue Coreg  8. Brief chest pain: not ACS, trops are negative  Management plans discussed with the patient and she is in agreement.  CODE STATUS: FULL   TOTAL TIME TAKING CARE OF THIS PATIENT: 25 minutes.     POSSIBLE D/C tomorrow, DEPENDING ON CLINICAL CONDITION.   Damali Broadfoot M.D on 04/13/2015 at 12:23 PM  Between 7am to 6pm - Pager - [phone] After 6pm go to [email or web address] - password [email or web address] [facility]  Office  [phone]  CC: Primary care physician; [name], MD  Note: This dictation was prepared with Dragon dictation along with smaller phrase technology. Any transcriptional errors that result from this process are unintentional.

## 2015-04-13 NOTE — Plan of Care (Signed)
Pt still complaining of being so tired and not being able to stay awake (since 11/1).  RN noticing that pts eyes are usually closed when entering the  Room.  Pt does normally respond to verbal or a light touch though.  Dr. Informed and will address on rounds.

## 2015-04-13 NOTE — Plan of Care (Signed)
Problem: Phase II Progression Outcomes Goal: Wean O2 if indicated Outcome: Not Applicable Date Met:  27/51/70 Remains on oxygen (chronic) Goal: Progress activity as tolerated unless otherwise ordered Outcome: Progressing Tolerating out of bed to chair

## 2015-04-13 NOTE — Progress Notes (Signed)
Patient: [name] [name] / Admit Date: 04/10/2015 / Date of Encounter: 04/13/2015, 5:06 PM   Subjective:  she reports slight improvement in symptoms. She feels that her dyspnea is still not back to baseline.  Review of Systems: Review of Systems  Constitutional: Positive for malaise/fatigue. Negative for fever, chills, weight loss and diaphoresis.  HENT: Negative for congestion.   Eyes: Negative for discharge and redness.  Respiratory: Positive for cough, shortness of breath and wheezing. Negative for hemoptysis and sputum production.   Cardiovascular: Positive for chest pain. Negative for palpitations, orthopnea, claudication, leg swelling and PND.  Gastrointestinal: Negative for heartburn, nausea, vomiting and abdominal pain.  Musculoskeletal: Negative for falls.  Skin: Negative for rash.  Neurological: Positive for weakness. Negative for dizziness, tingling, tremors, sensory change, speech change, focal weakness and loss of consciousness.  Endo/Heme/Allergies: Does not bruise/bleed easily.  Psychiatric/Behavioral: The patient is nervous/anxious.     Objective: Telemetry: Afib Physical Exam: Blood pressure 126/72, pulse 64, temperature 97.6 F (36.4 C), temperature source Oral, resp. rate 22, height 5\' 7"  (1.702 m), weight 158 lb 9.6 oz (71.94 kg), SpO2 96 %. Body mass index is 24.83 kg/(m^2). General: Well developed, well nourished, in no acute distress. Head: Normocephalic, atraumatic, sclera non-icteric, no xanthomas, nares are without discharge. Neck: Negative for carotid bruits. JVP not elevated. Lungs: Decreased breath sounds bilaterally with rhonchi left lower lobe. Breathing is unlabored. Mild expiratory wheezing Heart: Irregularly-irregular, S1 S2 without murmurs, rubs, or gallops. Chest pain is reproducible to palpation.  Abdomen: Soft, non-tender, non-distended with normoactive bowel sounds. No rebound/guarding. Extremities: No clubbing or cyanosis. No edema.  Neuro:  Alert and oriented X 3. Moves all extremities spontaneously. Psych:  Responds to questions appropriately with a normal affect.   Intake/Output Summary (Last 24 hours) at 04/13/15 1706 Last data filed at 04/13/15 1545  Gross per 24 hour  Intake    480 ml  Output   2175 ml  Net  -1695 ml    Inpatient Medications:  . azithromycin  500 mg Oral Daily  . budesonide-formoterol  2 puff Inhalation BID  . carvedilol  6.25 mg Oral BID WC  . docusate sodium  100 mg Oral BID  . furosemide  40 mg Oral BID  . loratadine  10 mg Oral Daily  . pantoprazole  40 mg Oral Daily  . potassium chloride  20 mEq Oral BID  . predniSONE  60 mg Oral Q breakfast  . pyridOXINE  100 mg Oral Daily  . Rivaroxaban  15 mg Oral QHS  . sodium chloride  3 mL Intravenous Q12H  . tiotropium  18 mcg Inhalation Daily  . vitamin B-12  1,000 mcg Oral Daily   Infusions:    Labs:  Recent Labs  04/12/15 0408 04/13/15 0353  NA 136 138  K 4.5 4.9  CL 96* 94*  CO2 37* 38*  GLUCOSE 176* 188*  BUN 34* 36*  CREATININE 1.19* 1.08*  CALCIUM 8.6* 9.1   No results for input(s): AST, ALT, ALKPHOS, BILITOT, PROT, ALBUMIN in the last 72 hours.  Recent Labs  04/11/15 0408 04/13/15 0353  WBC 7.9 8.8  HGB 11.6* 11.0*  HCT 33.8* 33.6*  MCV 93.3 94.7  PLT 218 232    Recent Labs  04/12/15 0016 04/13/15 1425  TROPONINI <0.03 <0.03   Invalid input(s): POCBNP No results for input(s): HGBA1C in the last 72 hours.   Weights: Filed Weights   04/11/15 0501 04/12/15 0442 04/13/15 0515  Weight: 156 lb  3.2 oz (70.852 kg) 160 lb 8 oz (72.802 kg) 158 lb 9.6 oz (71.94 kg)     Radiology/Studies:  Dg Chest 1 View  04/13/2015  CLINICAL DATA:  Respiratory failure with hypoxia. Shortness of breath. Atrial fibrillation. EXAM: CHEST 1 VIEW COMPARISON:  04/10/2015 FINDINGS: The heart size and mediastinal contours are within normal limits. Both lungs are clear. Previously seen mild left lower lower pole opacity no longer  visualized. No evidence of pneumothorax or pleural effusion. Surgical clips again seen within the left axilla. IMPRESSION: No active disease. Electronically Signed   By: Earle Gell M.D.   On: 04/13/2015 12:23   Dg Chest 1 View  04/10/2015  CLINICAL DATA:  Shortness of breath, COPD. EXAM: CHEST 1 VIEW COMPARISON:  CT scan of the chest of March 07, 2015 FINDINGS: The lungs are adequately inflated. There is stable apical pleural thickening bilaterally. Subtle increased density at the left lung base is present with partial obscuration of the hemidiaphragm. There is no significant pleural effusion. The heart is top-normal in size. The central pulmonary vascularity is prominent but stable. The mediastinum is normal in width. The bony thorax exhibits no acute abnormality. IMPRESSION: Mild atelectasis or early pneumonia in the left lower lobe. There may be a trace of pleural fluid. There is no pulmonary edema. A PA and lateral chest x-ray would be useful when the patient can tolerate the procedure. Electronically Signed   By: David  Martinique M.D.   On: 04/10/2015 14:12   Ct Angio Chest Pe W/cm &/or Wo Cm  04/13/2015  CLINICAL DATA:  81 year old female with a history of mild nonobstructive coronary artery disease, chronic respiratory failure on home oxygen and COPD. Presents with increased shortness of breath. EXAM: CT ANGIOGRAPHY CHEST WITH CONTRAST TECHNIQUE: Multidetector CT imaging of the chest was performed using the standard protocol during bolus administration of intravenous contrast. Multiplanar CT image reconstructions and MIPs were obtained to evaluate the vascular anatomy. CONTRAST:  24mL OMNIPAQUE IOHEXOL 350 MG/ML SOLN COMPARISON:  Prior chest x-ray obtained earlier today; most recent prior CT scan of the chest 03/07/2015 FINDINGS: Mediastinum: Unremarkable CT appearance of the thyroid gland. No suspicious mediastinal or hilar adenopathy. No soft tissue mediastinal mass. The thoracic esophagus is  unremarkable. Heart/Vascular: Adequate opacification of the pulmonary arteries to the proximal subsegmental level. No evidence of a pulmonary embolus. Extensive atherosclerotic vascular calcifications throughout the aorta which remains normal in caliber. Cardiomegaly with both left and right heart enlargement. Atherosclerotic calcifications present along the course of the coronary arteries. No pericardial effusion. Lungs/Pleura: Small bilateral pleural effusions. Advanced predominantly centrilobular emphysema. Regions of honeycombing, subpleural reticulation and architectural distortion in both upper lungs. Diffuse mild bronchial wall thickening. The lungs are hyperinflated. Mild dependent atelectasis of the lower lobes from the pleural effusions. No focal airspace consolidation or pneumothorax. Previously noted focal opacity in the posterior aspect of the right lower lobe has resolved. There are several small scattered pulmonary nodules the largest of which measures 5 mm in the periphery of the right lower lobe. These remain essentially unchanged dating back to October of 2015. The largest nodule remains stable dating back to November of 2014. Two year stability is consistent with benignity. No overt pulmonary edema. Bones/Soft Tissues: No acute fracture or aggressive appearing lytic or blastic osseous lesion. Stable chronic L1 and L2 compression fractures. Upper Abdomen: Visualized upper abdominal organs are unremarkable. Review of the MIP images confirms the above findings. IMPRESSION: 1. Negative for acute PE, pneumonia or other acute cardiopulmonary  process. 2. Stable cardiomegaly with both right and left heart enlargement. 3. Extensive atherosclerotic vascular calcifications including multivessel coronary artery calcifications. 4. Small bilateral pleural effusions with associated mild bibasilar atelectasis. 5. Pulmonary hyperinflation and advanced centrilobular emphysema with diffuse central bronchitic change  consistent with the clinical history of COPD. 6. Stable pleural parenchymal scarring versus early fibrotic change in both upper lungs. 7. Stable small scattered pulmonary nodules. 8. Stable chronic L1 and L2 compression fractures. Electronically Signed   By: Jacqulynn Cadet M.D.   On: 04/13/2015 16:49     Assessment and Plan  81 y.o. female with h/o mild nonobstructive CAD by cardiac catheterization in 1102, chronic systolic CHF due to recurrent stress-induced cardiomyopathy, chronic Afib on Xarelto, PAD s/p SFA stenting and left popliteal angioplasty in August 2016 by Dr. Ronalee Belts, COPD on home oxygen nightly, CKD stage II-III, HTN, and GERD who presented to [facility] on 10/31 with increased SOB and was found to have acute on chronic respiratory failure felt to be secondary to likely COPD exacerbation with component of acute on chronic diastolic CHF.   1. Acute on chronic respiratory failure: -Possibly multifactorial including COPD exacerbation and possible acute on chronic systolic CHF -Oxygen via nasal cannula remains at 3L currently  -Continue treatment for heart failure and COPD.  2. Mild acute on chronic systolic CHF: Echo showed normal LV systolic function with very mild pulmonary hypertension -Continue po Lasix 40 mg bid -Continue Coreg 6.25 mg bid -Limit IV fluids   3. COPD exacerbation: -Rocephin and azithromycin per IM -Nebs per IM, consider changing albuterol to Xopenex given her Afib if needed for rate control -Wean oxygen as above  3. Chronic Afib: -Rate controlled -Coreg 6.25 mg bid -Back on Xarelto . Telemetry showed less than 2 second pauses. I do not recommend making any changes at the present time.   4. History of stress induced cardiomyopathy: -Check echo as above -Lasix as above -Limit IV fluids  5. Dysphagia: -Unable to undergo EGD 2/2 acute medical condition -Back on Xarelto as of 11/1 -Continue soft foods -Consider apple sauce with her pills   6.  Hypokalemia: -Resolved  7. HTN: -Controlled -Continue current medications   Signed, Kathlyn Sacramento, MD   04/13/2015, 5:06 PM

## 2015-04-13 NOTE — Plan of Care (Addendum)
Pt complaining of SOB and heavy chest.  RN assessment revealed bilat diminished with slight crackles.  (Baseline from the AM assessment). Pt also has had x2 2sec pauses in tele (0710 and 1203). Strips printed and on chart.  VSS. Resp contacted for nebs treatment. Dr. Kandice Robinsons and ordered CT to r/o poss PE.  Cardio notified of 2sec pauses. CT called to request 20g in upper arm for study. Will take pt for test 1600 or later since pt ate lunch. Repeat troponins x3 also ordered

## 2015-04-13 NOTE — Progress Notes (Signed)
PT Cancellation Note  Patient Details Name: [name] [name] MRN: [id number] DOB: 09/15/1922   Cancelled Treatment:    Reason Eval/Treat Not Completed: Medical issues which prohibited therapy. Pt unable to be seen for PT at this time secondary to pending imaging for possible PE. Will re-attempt at later date/time.   Adalaide Jaskolski 04/13/2015, 3:09 PM   [name], PT, DPT [phone]

## 2015-04-14 ENCOUNTER — Telehealth: Payer: Self-pay

## 2015-04-14 DIAGNOSIS — I5033 Acute on chronic diastolic (congestive) heart failure: Secondary | ICD-10-CM | POA: Diagnosis not present

## 2015-04-14 DIAGNOSIS — I4891 Unspecified atrial fibrillation: Secondary | ICD-10-CM | POA: Diagnosis not present

## 2015-04-14 DIAGNOSIS — I5022 Chronic systolic (congestive) heart failure: Secondary | ICD-10-CM | POA: Diagnosis not present

## 2015-04-14 DIAGNOSIS — I5021 Acute systolic (congestive) heart failure: Secondary | ICD-10-CM | POA: Diagnosis not present

## 2015-04-14 DIAGNOSIS — I10 Essential (primary) hypertension: Secondary | ICD-10-CM | POA: Diagnosis not present

## 2015-04-14 DIAGNOSIS — Z8701 Personal history of pneumonia (recurrent): Secondary | ICD-10-CM | POA: Diagnosis not present

## 2015-04-14 DIAGNOSIS — M6281 Muscle weakness (generalized): Secondary | ICD-10-CM | POA: Diagnosis not present

## 2015-04-14 DIAGNOSIS — J189 Pneumonia, unspecified organism: Secondary | ICD-10-CM | POA: Diagnosis not present

## 2015-04-14 DIAGNOSIS — K219 Gastro-esophageal reflux disease without esophagitis: Secondary | ICD-10-CM | POA: Diagnosis not present

## 2015-04-14 DIAGNOSIS — J449 Chronic obstructive pulmonary disease, unspecified: Secondary | ICD-10-CM | POA: Diagnosis not present

## 2015-04-14 DIAGNOSIS — R0602 Shortness of breath: Secondary | ICD-10-CM | POA: Diagnosis not present

## 2015-04-14 DIAGNOSIS — I482 Chronic atrial fibrillation: Secondary | ICD-10-CM | POA: Diagnosis not present

## 2015-04-14 DIAGNOSIS — I509 Heart failure, unspecified: Secondary | ICD-10-CM | POA: Diagnosis not present

## 2015-04-14 DIAGNOSIS — I2510 Atherosclerotic heart disease of native coronary artery without angina pectoris: Secondary | ICD-10-CM | POA: Diagnosis not present

## 2015-04-14 DIAGNOSIS — I481 Persistent atrial fibrillation: Secondary | ICD-10-CM | POA: Diagnosis not present

## 2015-04-14 DIAGNOSIS — I5023 Acute on chronic systolic (congestive) heart failure: Secondary | ICD-10-CM | POA: Diagnosis not present

## 2015-04-14 DIAGNOSIS — E876 Hypokalemia: Secondary | ICD-10-CM | POA: Diagnosis not present

## 2015-04-14 DIAGNOSIS — Z5189 Encounter for other specified aftercare: Secondary | ICD-10-CM | POA: Diagnosis not present

## 2015-04-14 DIAGNOSIS — J441 Chronic obstructive pulmonary disease with (acute) exacerbation: Secondary | ICD-10-CM | POA: Diagnosis not present

## 2015-04-14 DIAGNOSIS — J9621 Acute and chronic respiratory failure with hypoxia: Secondary | ICD-10-CM | POA: Diagnosis not present

## 2015-04-14 LAB — BASIC METABOLIC PANEL
Anion gap: 3 — ABNORMAL LOW (ref 5–15)
BUN: 37 mg/dL — AB (ref 6–20)
CHLORIDE: 97 mmol/L — AB (ref 101–111)
CO2: 38 mmol/L — AB (ref 22–32)
CREATININE: 1.06 mg/dL — AB (ref 0.44–1.00)
Calcium: 8.9 mg/dL (ref 8.9–10.3)
GFR calc Af Amer: 52 mL/min — ABNORMAL LOW (ref >60)
GFR calc non Af Amer: 45 mL/min — ABNORMAL LOW (ref >60)
GLUCOSE: 213 mg/dL — AB (ref 65–99)
Potassium: 4.9 mmol/L (ref 3.5–5.1)
Sodium: 138 mmol/L (ref 135–145)

## 2015-04-14 LAB — GLUCOSE, CAPILLARY
GLUCOSE-CAPILLARY: 273 mg/dL — AB (ref 65–99)
Glucose-Capillary: 115 mg/dL — ABNORMAL HIGH (ref 65–99)

## 2015-04-14 LAB — TROPONIN I: Troponin I: <0.03 ng/mL (ref <0.031)

## 2015-04-14 MED ORDER — PREDNISONE 10 MG PO TABS: 10.0000 mg | ORAL_TABLET | Freq: Every day | ORAL | Status: DC

## 2015-04-14 MED ORDER — AZITHROMYCIN 250 MG PO TABS: ORAL_TABLET | ORAL | Status: DC

## 2015-04-14 NOTE — Progress Notes (Signed)
Spoke with Kevan Ny, [facility] rep at [phone], to notify of non-emergent EMS transport.  Auth notification reference given as [id number].   Service date range good from 04/14/15 - 07/13/15.   Gap exception requested to determine if services can be considered at an in-network level.

## 2015-04-14 NOTE — Progress Notes (Addendum)
Pt discharging to [facility]. Packet prepared by CSW, [employer]. Report called to [facility] at [facility]. EMS called for transport. Patient resting quietly in room. No complaints at this time. IV and telemetry to be removed when EMS arrives. Patient on oxygen chronically. Since d/c to facility, pt declines participation in [facility]/COPD Gold program.

## 2015-04-14 NOTE — Clinical Social Work Placement (Signed)
   CLINICAL SOCIAL WORK PLACEMENT  NOTE  Date:  04/14/2015  Patient Details  Name: [name] [name] MRN: [id number] Date of Birth: 11-06-1922  Clinical Social Work is seeking post-discharge placement for this patient at the [place] level of care (*CSW will initial, date and re-position this form in  chart as items are completed):  Yes   Patient/family provided with [facility] Work Department's list of facilities offering this level of care within the geographic area requested by the patient (or if unable, by the patient's family).  Yes   Patient/family informed of their freedom to choose among providers that offer the needed level of care, that participate in Medicare, Medicaid or managed care program needed by the patient, have an available bed and are willing to accept the patient.  Yes   Patient/family informed of [facility]'s ownership interest in [facility] and [facility], as well as of the fact that they are under no obligation to receive care at these facilities.  PASRR submitted to EDS on 04/14/15     PASRR number received on 04/14/15     Existing PASRR number confirmed on       FL2 transmitted to all facilities in geographic area requested by pt/family on 04/14/15     FL2 transmitted to all facilities within larger geographic area on       Patient informed that his/her managed care company has contracts with or will negotiate with certain facilities, including the following:        Yes   Patient/family informed of bed offers received.  Patient chooses bed at  ([facility])     Physician recommends and patient chooses bed at      Patient to be transferred to  ([facility]) on 04/14/15.  Patient to be transferred to facility by  (EMS)     Patient family notified on 04/14/15 of transfer.  Name of family member notified:   Pleas Koch)     PHYSICIAN Please sign FL2     Additional Comment:     _______________________________________________ Maurine Cane, LCSW 04/14/2015, 12:05 PM

## 2015-04-14 NOTE — Clinical Social Work Note (Signed)
Clinical Social Work Assessment  Patient Details  Name: [name] [name] MRN: [id number] Date of Birth: December 22, 1922  Date of referral:  04/14/15               Reason for consult:  Facility Placement                Permission sought to share information with:  Facility Sport and exercise psychologist, Family Supports Permission granted to share information::  Yes, Verbal Permission Granted  Name::      (Diiana  Smiith Event organiser in-law)  Agency::     Relationship::     Contact Information:     Housing/Transportation Living arrangements for the past 2 months:  Single Family Home Source of Information:  Patient Patient Interpreter Needed:  None Criminal Activity/Legal Involvement Pertinent to Current Situation/Hospitalization:  No - Comment as needed Significant Relationships:  Adult Children, Pets, Friend Lives with:  Self Do you feel safe going back to the place where you live?  Yes (but not rilght now) Need for family participation in patient care:  Yes (Comment)  Care giving concerns:  None at this time   Facilities manager / plan:  Holiday representative (CSW) consult for SNF placement, PT is recommending STR to assist with getting patient back to previous level of functioning.   Patient was alert and oriented and engaged in conversation with CSW.  CSW introduced self and explained role of CSW department.  Patient currently lives alone.  Patient's support system is her daughter in- law .  CSW explained that PT is recommending rehab.  Patient has been to SNF in the past.   CSW reviewed SNF process with patient, Patient is agreeable to SNF search.     CSW will complete FL2, and fax out for available bed in anticipation of discharging to SNF for rehab.   Employment status:  Retired Nurse, adult PT Recommendations:  [place] / Referral to community resources:  [place]  Patient/Family's Response to care:  Patient is in  agreement with Plan.  Patient/Family's Understanding of and Emotional Response to Diagnosis, Current Treatment, and Prognosis:  Patient understands she will discharge to SNF when  She leaves [facility].  Emotional Assessment Appearance:  Appears stated age Attitude/Demeanor/Rapport:    Affect (typically observed):  Accepting, Appropriate, Pleasant Orientation:  Oriented to Self, Oriented to Place, Oriented to  Time, Oriented to Situation Alcohol / Substance use:  Never Used Psych involvement (Current and /or in the community):  No (Comment)  Discharge Needs  Concerns to be addressed:  Denies Needs/Concerns at this time Readmission within the last 30 days:  Yes Current discharge risk:  Chronically ill, Dependent with Mobility Barriers to Discharge:  No Barriers Identified   Maurine Cane, LCSW 04/14/2015, 11:14 AM

## 2015-04-14 NOTE — NC FL2 (Signed)
[place] LEVEL OF CARE SCREENING TOOL     IDENTIFICATION  Patient Name: [name] [name] Birthdate: 04-04-1923 Sex: female Admission Date (Current Location): 04/10/2015  [place] and [place] Number:  Set designer)   Facility and Address:  [facility], [address], [place], [place] [address]      Provider Number: [id number]  Attending Physician Name and Address:  Bettey Costa, MD  Relative Name and Phone Number:       Current Level of Care: Hospital Recommended Level of Care: [place] Prior Approval Number:    Date Approved/Denied:   PASRR Number:  [id number])  Discharge Plan: SNF    Current Diagnoses: Patient Active Problem List   Diagnosis Date Noted  . Respiratory failure with hypoxia ([place]) 04/10/2015  . Diverticulitis large intestine 03/28/2015  . Acute on chronic respiratory failure with hypoxemia ([place]) 03/03/2015  . Essential hypertension 04/08/2014  . Chronic systolic heart failure ([place]) 04/30/2013  . PAD (peripheral artery disease) ([place]) 12/07/2012  . Atrial fibrillation ([place]) 10/31/2012  . Pleural effusion 10/29/2012  . Acute systolic CHF (congestive heart failure) ([place]) 10/28/2012  . Acute respiratory failure ([place]) 10/28/2012  . Hypokalemia 10/28/2012  . Cardiogenic shock ([place]) 10/28/2012  . GERD (gastroesophageal reflux disease) 10/28/2012  . Esophageal stricture 10/28/2012  . Community acquired pneumonia 10/26/2012  . Takotsubo syndrome 10/25/2012    Orientation ACTIVITIES/SOCIAL BLADDER RESPIRATION    Self, Time, Situation, Place  Active Continent O2 (As needed)  BEHAVIORAL SYMPTOMS/MOOD NEUROLOGICAL BOWEL NUTRITION STATUS      Continent Diet (regular)  PHYSICIAN VISITS COMMUNICATION OF NEEDS Height & Weight Skin    Verbally 5\' 7"  (170.2 cm) 161 lbs.            AMBULATORY STATUS RESPIRATION    Assist extensive O2 (As needed)      Personal Care Assistance Level of Assistance  Bathing,  Feeding Bathing Assistance: Maximum assistance Feeding assistance: Independent Dressing Assistance: Maximum assistance      Functional Limitations Info  Sight, Hearing, Speech Sight Info: Adequate Hearing Info: Adequate Speech Info: Adequate       SPECIAL CARE FACTORS FREQUENCY  PT (By licensed PT)                   Additional Factors Info  Allergies   Allergies Info:  (: Penicillins, Levaquin, Sulfa Antibiotics)           Current Medications (04/14/2015): Current Facility-Administered Medications  Medication Dose Route Frequency Provider Last Rate Last Dose  . acetaminophen (TYLENOL) tablet 650 mg  650 mg Oral Q6H PRN Max Sane, MD       Or  . acetaminophen (TYLENOL) suppository 650 mg  650 mg Rectal Q6H PRN Vipul Manuella Ghazi, MD      . albuterol (PROVENTIL) (2.5 MG/3ML) 0.083% nebulizer solution 2.5 mg  2.5 mg Nebulization QID PRN Max Sane, MD   2.5 mg at 04/13/15 1323  . alum & mag hydroxide-simeth (MAALOX/MYLANTA) 200-200-20 MG/5ML suspension 30 mL  30 mL Oral Q6H PRN Max Sane, MD      . azithromycin (ZITHROMAX) tablet 500 mg  500 mg Oral Daily Bettey Costa, MD   500 mg at 04/14/15 0851  . bisacodyl (DULCOLAX) EC tablet 5 mg  5 mg Oral Daily PRN Max Sane, MD   5 mg at 04/12/15 1037  . budesonide-formoterol (SYMBICORT) 160-4.5 MCG/ACT inhaler 2 puff  2 puff Inhalation BID Erby Pian, MD   2 puff at 04/14/15 0853  .  carvedilol (COREG) tablet 6.25 mg  6.25 mg Oral BID WC Max Sane, MD   6.25 mg at 04/14/15 0851  . docusate sodium (COLACE) capsule 100 mg  100 mg Oral BID Max Sane, MD   100 mg at 04/14/15 0852  . furosemide (LASIX) tablet 40 mg  40 mg Oral BID Max Sane, MD   40 mg at 04/14/15 0852  . ipratropium-albuterol (DUONEB) 0.5-2.5 (3) MG/3ML nebulizer solution 3 mL  3 mL Nebulization Once Andria Frames, MD      . loratadine (CLARITIN) tablet 10 mg  10 mg Oral Daily Max Sane, MD   10 mg at 04/14/15 0851  . nitroGLYCERIN (NITROSTAT) SL tablet 0.4  mg  0.4 mg Sublingual Q5 min PRN Lance Coon, MD   0.4 mg at 04/12/15 0002  . ondansetron (ZOFRAN) tablet 4 mg  4 mg Oral Q6H PRN Max Sane, MD       Or  . ondansetron (ZOFRAN) injection 4 mg  4 mg Intravenous Q6H PRN Vipul Shah, MD      . pantoprazole (PROTONIX) EC tablet 40 mg  40 mg Oral Daily Max Sane, MD   40 mg at 04/14/15 0851  . potassium chloride SA (K-DUR,KLOR-CON) CR tablet 20 mEq  20 mEq Oral BID Max Sane, MD   20 mEq at 04/14/15 0852  . predniSONE (DELTASONE) tablet 60 mg  60 mg Oral Q breakfast Bettey Costa, MD   60 mg at 04/14/15 0852  . pyridOXINE (VITAMIN B-6) tablet 100 mg  100 mg Oral Daily Max Sane, MD   100 mg at 04/14/15 0852  . Rivaroxaban (XARELTO) tablet 15 mg  15 mg Oral QHS Max Sane, MD   15 mg at 04/13/15 2136  . sodium chloride 0.9 % injection 3 mL  3 mL Intravenous Q12H Max Sane, MD   3 mL at 04/14/15 0850  . tiotropium (SPIRIVA) inhalation capsule 18 mcg  18 mcg Inhalation Daily Max Sane, MD   18 mcg at 04/14/15 0853  . traZODone (DESYREL) tablet 25 mg  25 mg Oral QHS PRN Max Sane, MD   25 mg at 04/14/15 0125  . vitamin B-12 (CYANOCOBALAMIN) tablet 1,000 mcg  1,000 mcg Oral Daily Max Sane, MD   1,000 mcg at 04/14/15 1497   Do not use this list as official medication orders. Please verify with discharge summary.  Discharge Medications:   Medication List    TAKE these medications        albuterol (2.5 MG/3ML) 0.083% nebulizer solution  Commonly known as:  PROVENTIL  Take 2.5 mg by nebulization 4 (four) times daily as needed for wheezing or shortness of breath.     albuterol 108 (90 BASE) MCG/ACT inhaler  Commonly known as:  PROVENTIL HFA;VENTOLIN HFA  Inhale 2 puffs into the lungs every 4 (four) hours as needed for wheezing or shortness of breath.     azithromycin 250 MG tablet  Commonly known as:  ZITHROMAX  1 tablet daily for 6 days     carvedilol 6.25 MG tablet  Commonly known as:  COREG  Take 6.25 mg by mouth 2 (two) times daily  with a meal.     Fluticasone-Salmeterol 250-50 MCG/DOSE Aepb  Commonly known as:  ADVAIR  Inhale 1 puff into the lungs 2 (two) times daily.     furosemide 40 MG tablet  Commonly known as:  LASIX  Take 40 mg by mouth 2 (two) times daily.     loratadine 10 MG tablet  Commonly known as:  CLARITIN  Take 10 mg by mouth daily.     omeprazole 20 MG capsule  Commonly known as:  PRILOSEC  Take 1 capsule (20 mg total) by mouth daily.     predniSONE 10 MG tablet  Commonly known as:  DELTASONE  Take 1 tablet (10 mg total) by mouth daily with breakfast.     pyridOXINE 100 MG tablet  Commonly known as:  VITAMIN B-6  Take 100 mg by mouth daily.     ranitidine 150 MG capsule  Commonly known as:  ZANTAC  TAKE 1 CAPSULE BY MOUTH EVERY NIGHT     Rivaroxaban 15 MG Tabs tablet  Commonly known as:  XARELTO  Take 15 mg by mouth at bedtime.     tiotropium 18 MCG inhalation capsule  Commonly known as:  SPIRIVA  Place 18 mcg into inhaler and inhale daily.     vitamin B-12 1000 MCG tablet  Commonly known as:  CYANOCOBALAMIN  Take 1,000 mcg by mouth daily.        Relevant Imaging Results:  Relevant Lab Results:  Recent Labs    Additional Information  ([id number])  Maurine Cane, LCSW

## 2015-04-14 NOTE — Progress Notes (Signed)
Physical Therapy Treatment Patient Details Name: JANAYE CORP MRN: [id number] DOB: 26-Mar-1923 Today's Date: 04/14/2015    History of Present Illness This patient is a 81 year old female who came to [facility] ED with shortness of breath.    PT Comments    Pt with decreased strength, endurance, and capacity to perform functional mobility. Although the pt is very pleasant and motivated to attempt therapy tasks, it is evident that she is generally deconditioned and desaturates during ambulation on 3L O2 [place] (returns to baseline within 1 minute of sitting rest). Because of this deconditioning and removal from baseline she will benefit from skilled PT in order for her to return to premorbid state  Follow Up Recommendations  SNF     Equipment Recommendations  Rolling walker with 5" wheels    Recommendations for Other Services       Precautions / Restrictions Precautions Precautions: Fall Restrictions Weight Bearing Restrictions: No    Mobility  Bed Mobility Overal bed mobility: Needs Assistance Bed Mobility: Supine to Sit     Supine to sit: Min assist     General bed mobility comments: Pt needs assist for management trunk secondary to weakness.   Transfers Overall transfer level: Needs assistance Equipment used: Rolling walker (2 wheeled);None Transfers: Sit to/from Stand Sit to Stand: Min assist         General transfer comment: Pt needs assist to get into standing, although not a lot of assist. Pt is very fatigued and appears weaker this date  Ambulation/Gait Ambulation/Gait assistance: Min assist Ambulation Distance (Feet): 40 Feet Assistive device: Rolling walker (2 wheeled) Gait Pattern/deviations: Decreased step length - right;Decreased step length - left;Step-through pattern Gait velocity: greatly decreased Gait velocity interpretation: Below normal speed for age/gender General Gait Details: Pt much more fatigued with ambulation today. She ambulated on 3L O2 [place] and  after 40 ft had dropped down to 82% SaO2. Pt quite limited in her endurance today. Slightly unstable with RW but no LOB   Stairs            Wheelchair Mobility    Modified Rankin (Stroke Patients Only)       Balance Overall balance assessment:  (Pt slightly unsteady with RW ambulation. )                                  Cognition Arousal/Alertness: Awake/alert Behavior During Therapy: WFL for tasks assessed/performed Overall Cognitive Status: Within Functional Limits for tasks assessed                      Exercises Other Exercises Other Exercises: Pt performed bilateral therex x 15 reps at supervision for proper technique. Exercises included: ankle pumps, open chain leg press, SLR, hip abd, and LAQ, standing hip abd, and standing hip ext.     General Comments        Pertinent Vitals/Pain Pain Assessment: No/denies pain    Home Living                      Prior Function            PT Goals (current goals can now be found in the care plan section) Acute Rehab PT Goals Patient Stated Goal: to walk PT Goal Formulation: With patient Time For Goal Achievement: 04/25/15 Potential to Achieve Goals: Good Progress towards PT goals: PT to reassess next treatment  Frequency  Min 2X/week    PT Plan Discharge plan needs to be updated    Co-evaluation             End of Session Equipment Utilized During Treatment: Gait belt;Oxygen Activity Tolerance: Patient tolerated treatment well Patient left: in chair;with call bell/phone within reach;with chair alarm set     Time: 6384-5364 PT Time Calculation (min) (ACUTE ONLY): 24 min  Charges:                       G CodesJanyth Contes 05-05-2015, 2:55 PM  Janyth Contes, SPT. [phone]

## 2015-04-14 NOTE — Care Management Important Message (Signed)
Important Message  Patient Details  Name: [name] [name] MRN: [id number] Date of Birth: 1923/05/30   Medicare Important Message Given:  Yes-third notification given    Katrina Stack, RN 04/14/2015, 9:16 AM

## 2015-04-14 NOTE — Progress Notes (Signed)
Clinical Social Worker informed [name], MD that patient is medically ready to discharge to SNF, Patient is in a agreement with plan.  Call to [facility] to confirm that patient's bed is ready. Provided patient's room number 140 and number to call for report [phone] .  Left message with patient's daughter in- law per patient's request.   CSW obtained signature for [name] [name] and fax all  discharge information to facility.  RN will call report and patient will discharge to [facility] via EMS.  [name] [name]. [name], MSW Clinical Social Work Department [phone] 12:03 PM

## 2015-04-14 NOTE — Discharge Summary (Signed)
[facility] NAME: [name] [name]    MR#:  [id number]  DATE OF BIRTH:  Sep 21, 1922  DATE OF ADMISSION:  04/10/2015 ADMITTING PHYSICIAN: Max Sane, MD  DATE OF DISCHARGE: 04/14/2015    PRIMARY CARE PHYSICIAN: [name], MD    ADMISSION DIAGNOSIS:  COPD exacerbation ([place]) [J44.1] HCAP (healthcare-associated pneumonia) [J18.9]  DISCHARGE DIAGNOSIS:  Active Problems:   Respiratory failure with hypoxia ([place])   SECONDARY DIAGNOSIS:   Past Medical History  Diagnosis Date  . HTN (hypertension)   . COPD (chronic obstructive pulmonary disease) (HCC)     a. on home O2 nightly.  . Diverticulosis   . Takotsubo cardiomyopathy 10/2012    a. 10/2012: presented w/ cardiogenic shock/systolic CHF/VDRF - EF 73% by cath, 40% by f/u echo - cath with mild nonobstructive CAD. b. Not discharged on ACEI due to hypotension.  . Cardiogenic shock ([place])     a. 10/2012 - due to Takotsubo CM.  [name] Respiratory failure ([place])     a. On home O2 nightly. b. VDRF 10/2012 - due to CAP/pulm edema in setting of cardiogenic shock/Takotsubo CM.  [name] CAP (community acquired pneumonia)     a. 10/2012 - will need f/u CXR in June 2014 to ensure clearance.  . Esophageal stricture     a. s/p dilitation 10/2012.  [name] Pleural effusion, right     a. s/p thoracentesis 10/28/12.  . Atrial fibrillation ([place])     a. Dx 10/2012, rate controlled, started on Xarelto.  . Claudication ([place])     a. ABI 10/2012 - R 0.87, L 0.74.  [name] GERD (gastroesophageal reflux disease)   . CKD (chronic kidney disease), stage III   . Breast CA (HCC)     hx  . Abdominal hernia     chronic  . STEMI (ST elevation myocardial infarction) ([place])   . Cellulitis   . CHF (congestive heart failure) ([place])   . CAD (coronary artery disease)     a. 10/2012 - presented w/ STEMI felt due to Takotsubo - mild nonobstructive dz by cath.    HOSPITAL COURSE:  81 year old female with a history of mild  nonobstructive CAD, chronic respiratory failure on 2 and half liters of oxygen for COPD and chronic systolic heart failure due to recurrent stress-induced cardiomyopathy, chronic atrial fibrillation on the relative who presented with shortness of breath.  1. Acute on chronic respiratory hypoxic : This is likely due to a combination of COPD exacerbation with very mild acute on chronic systolic heart failure and pneumonia. Patient is currently on her baseline oxygen. She was on Rocephin and azithromycin for pneumonia. She was on steroids for COPD exacerbation and small dose of IV Lasix was given for mild CHF exacerbation. She will be discharged on PO azithromycin and Lasix. CT scan shows no pulmonary emboli, CHF or PNA at discharge.   2. Acute COPD exacerbation: Patient has improved. Continue her chronic oxygen, inhalers and nebulizer treatments..   3. Acute on chronic diastolic heart failure EF 65 %: Appreciate cardiology consultation.Patient was given 1 dose of 40 mg of IV Lasix. Patient will continue Coreg and oral Lasix dose  4. Hypokalemia: Potassium was repleted PRN.  5. Chronic atrial fibrillation: Her heart rate is controlled. AC restarted as patient not planned for EGD 6. Dysphagia: I spoke with Dr Allen Norris, GI MD. Patient was restarted on anticoagulation and therefore cannot have EGD performed. Patient will need a follow-up with Dr. Allen Norris after  discharge with plan for EGD in the future. As per Cardiology, ok to hold XARELTO for 1-2 days prior to EGD no ned to hold for 5 days  7. Essential hypertension: Blood pressures controlled. Continue Coreg  8. Brief chest pain: not ACS, trops are negative  DISCHARGE CONDITIONS AND DIET:  Stable condition Regular diet  CONSULTS OBTAINED:  Treatment Team:  Nicholes Mango, MD Minna Merritts, MD Erby Pian, MD  DRUG ALLERGIES:   Allergies  Allergen Reactions  . Penicillins Itching, Swelling, Rash and Other (See Comments)    Pt is unable  to answer additional questions about this medication because it happened so long ago.  [name] [Levofloxacin In D5w]   . Sulfa Antibiotics Other (See Comments)    Reaction:  Unknown     DISCHARGE MEDICATIONS:   Current Discharge Medication List    START taking these medications   Details  azithromycin (ZITHROMAX) 250 MG tablet 1 tablet daily for 6 days Qty: 6 each, Refills: 0    predniSONE (DELTASONE) 10 MG tablet Take 1 tablet (10 mg total) by mouth daily with breakfast. Qty: 30 tablet, Refills: 0      CONTINUE these medications which have NOT CHANGED   Details  albuterol (PROVENTIL) (2.5 MG/3ML) 0.083% nebulizer solution Take 2.5 mg by nebulization 4 (four) times daily as needed for wheezing or shortness of breath.    carvedilol (COREG) 6.25 MG tablet Take 6.25 mg by mouth 2 (two) times daily with a meal.    Fluticasone-Salmeterol (ADVAIR) 250-50 MCG/DOSE AEPB Inhale 1 puff into the lungs 2 (two) times daily.    furosemide (LASIX) 40 MG tablet Take 40 mg by mouth 2 (two) times daily.     loratadine (CLARITIN) 10 MG tablet Take 10 mg by mouth daily.    omeprazole (PRILOSEC) 20 MG capsule Take 1 capsule (20 mg total) by mouth daily. Qty: 30 capsule, Refills: 11    ranitidine (ZANTAC) 150 MG capsule TAKE 1 CAPSULE BY MOUTH EVERY NIGHT Refills: 6    Rivaroxaban (XARELTO) 15 MG TABS tablet Take 15 mg by mouth at bedtime.    tiotropium (SPIRIVA) 18 MCG inhalation capsule Place 18 mcg into inhaler and inhale daily.    vitamin B-12 (CYANOCOBALAMIN) 1000 MCG tablet Take 1,000 mcg by mouth daily.    albuterol (PROVENTIL HFA;VENTOLIN HFA) 108 (90 BASE) MCG/ACT inhaler Inhale 2 puffs into the lungs every 4 (four) hours as needed for wheezing or shortness of breath.    pyridOXINE (VITAMIN B-6) 100 MG tablet Take 100 mg by mouth daily.              Today   CHIEF COMPLAINT:  Doing ok a bit weak when she walks no wheezing or chest pain   VITAL SIGNS:  Blood  pressure 142/65, pulse 62, temperature 97.7 F (36.5 C), temperature source Oral, resp. rate 18, height 5\' 7"  (1.702 m), weight 72.031 kg (158 lb 12.8 oz), SpO2 98 %.   REVIEW OF SYSTEMS:  Review of Systems  Constitutional: Negative for fever, chills and malaise/fatigue.  HENT: Negative for sore throat.   Eyes: Negative for blurred vision.  Respiratory: Negative for cough, hemoptysis, shortness of breath (exeritional) and wheezing.   Cardiovascular: Negative for chest pain, palpitations and leg swelling.  Gastrointestinal: Negative for nausea, vomiting, abdominal pain, diarrhea and blood in stool.  Genitourinary: Negative for dysuria.  Musculoskeletal: Negative for back pain.  Neurological: Positive for weakness. Negative for dizziness, tremors and headaches.  Endo/Heme/Allergies: Does not  bruise/bleed easily.     PHYSICAL EXAMINATION:  GENERAL:  81 y.o.-year-old patient lying in the bed with no acute distress.  NECK:  Supple, no jugular venous distention. No thyroid enlargement, no tenderness.  LUNGS: Normal breath sounds bilaterally, no wheezing, rales,rhonchi  No use of accessory muscles of respiration.  CARDIOVASCULAR: S1, S2 normal. No murmurs, rubs, or gallops.  ABDOMEN: Soft, non-tender, non-distended. Bowel sounds present. No organomegaly or mass.  EXTREMITIES: No pedal edema, cyanosis, or clubbing.  PSYCHIATRIC: The patient is alert and oriented x 3.  SKIN: No obvious rash, lesion, or ulcer. brusies on arms right forearm with IV infiltrastion no pain or swelling  DATA REVIEW:   CBC  Recent Labs Lab 04/13/15 0353  WBC 8.8  HGB 11.0*  HCT 33.6*  PLT 232    Chemistries   Recent Labs Lab 04/14/15 0438  NA 138  K 4.9  CL 97*  CO2 38*  GLUCOSE 213*  BUN 37*  CREATININE 1.06*  CALCIUM 8.9    Cardiac Enzymes  Recent Labs Lab 04/12/15 0016 04/13/15 1425 04/14/15 0149  TROPONINI <0.03 <0.03 <0.03    Microbiology Results  @[email or web address] @  RADIOLOGY:   Dg Chest 1 View  04/13/2015  CLINICAL DATA:  Respiratory failure with hypoxia. Shortness of breath. Atrial fibrillation. EXAM: CHEST 1 VIEW COMPARISON:  04/10/2015 FINDINGS: The heart size and mediastinal contours are within normal limits. Both lungs are clear. Previously seen mild left lower lower pole opacity no longer visualized. No evidence of pneumothorax or pleural effusion. Surgical clips again seen within the left axilla. IMPRESSION: No active disease. Electronically Signed   By: Earle Gell M.D.   On: 04/13/2015 12:23   Ct Angio Chest Pe W/cm &/or Wo Cm  04/13/2015  CLINICAL DATA:  81 year old female with a history of mild nonobstructive coronary artery disease, chronic respiratory failure on home oxygen and COPD. Presents with increased shortness of breath. EXAM: CT ANGIOGRAPHY CHEST WITH CONTRAST TECHNIQUE: Multidetector CT imaging of the chest was performed using the standard protocol during bolus administration of intravenous contrast. Multiplanar CT image reconstructions and MIPs were obtained to evaluate the vascular anatomy. CONTRAST:  34mL OMNIPAQUE IOHEXOL 350 MG/ML SOLN COMPARISON:  Prior chest x-ray obtained earlier today; most recent prior CT scan of the chest 03/07/2015 FINDINGS: Mediastinum: Unremarkable CT appearance of the thyroid gland. No suspicious mediastinal or hilar adenopathy. No soft tissue mediastinal mass. The thoracic esophagus is unremarkable. Heart/Vascular: Adequate opacification of the pulmonary arteries to the proximal subsegmental level. No evidence of a pulmonary embolus. Extensive atherosclerotic vascular calcifications throughout the aorta which remains normal in caliber. Cardiomegaly with both left and right heart enlargement. Atherosclerotic calcifications present along the course of the coronary arteries. No pericardial effusion. Lungs/Pleura: Small bilateral pleural effusions. Advanced predominantly centrilobular emphysema. Regions of honeycombing, subpleural  reticulation and architectural distortion in both upper lungs. Diffuse mild bronchial wall thickening. The lungs are hyperinflated. Mild dependent atelectasis of the lower lobes from the pleural effusions. No focal airspace consolidation or pneumothorax. Previously noted focal opacity in the posterior aspect of the right lower lobe has resolved. There are several small scattered pulmonary nodules the largest of which measures 5 mm in the periphery of the right lower lobe. These remain essentially unchanged dating back to October of 2015. The largest nodule remains stable dating back to November of 2014. Two year stability is consistent with benignity. No overt pulmonary edema. Bones/Soft Tissues: No acute fracture or aggressive appearing lytic or blastic osseous lesion. Stable chronic L1  and L2 compression fractures. Upper Abdomen: Visualized upper abdominal organs are unremarkable. Review of the MIP images confirms the above findings. IMPRESSION: 1. Negative for acute PE, pneumonia or other acute cardiopulmonary process. 2. Stable cardiomegaly with both right and left heart enlargement. 3. Extensive atherosclerotic vascular calcifications including multivessel coronary artery calcifications. 4. Small bilateral pleural effusions with associated mild bibasilar atelectasis. 5. Pulmonary hyperinflation and advanced centrilobular emphysema with diffuse central bronchitic change consistent with the clinical history of COPD. 6. Stable pleural parenchymal scarring versus early fibrotic change in both upper lungs. 7. Stable small scattered pulmonary nodules. 8. Stable chronic L1 and L2 compression fractures. Electronically Signed   By: Jacqulynn Cadet M.D.   On: 04/13/2015 16:49      Management plans discussed with the patient and she is in agreement. Stable for discharge   Patient should follow up with PCP in1 week and Dr Fletcher Anon in 1 week  CODE STATUS:     Code Status Orders        Start     Ordered    04/10/15 1811  Full code   Continuous     04/10/15 1810      TOTAL TIME TAKING CARE OF THIS PATIENT: 35 minutes.    Note: This dictation was prepared with Dragon dictation along with smaller phrase technology. Any transcriptional errors that result from this process are unintentional.  Emrie Gayle M.D on 04/14/2015 at 10:15 AM  Between 7am to 6pm - Pager - [phone] After 6pm go to [email or web address] - password [email or web address] [facility]  Office  [phone]  CC: Primary care physician; [name], MD

## 2015-04-14 NOTE — Progress Notes (Signed)
Patient: [name] [name] / Admit Date: 04/10/2015 / Date of Encounter: 04/14/2015, 9:57 AM   Subjective: " I feel bad and weak". She was able to walk only a short distance with PT. O2 sat decreased to 82 % with walking.   Review of Systems: Review of Systems  Constitutional: Positive for malaise/fatigue. Negative for fever, chills, weight loss and diaphoresis.  HENT: Negative for congestion.   Eyes: Negative for discharge and redness.  Respiratory: Positive for cough, shortness of breath and wheezing. Negative for hemoptysis and sputum production.   Cardiovascular: Positive for chest pain. Negative for palpitations, orthopnea, claudication, leg swelling and PND.  Gastrointestinal: Negative for heartburn, nausea, vomiting and abdominal pain.  Musculoskeletal: Negative for falls.  Skin: Negative for rash.  Neurological: Positive for weakness. Negative for dizziness, tingling, tremors, sensory change, speech change, focal weakness and loss of consciousness.  Endo/Heme/Allergies: Does not bruise/bleed easily.  Psychiatric/Behavioral: The patient is nervous/anxious.     Objective: Telemetry: Afib Physical Exam: Blood pressure 142/65, pulse 62, temperature 97.7 F (36.5 C), temperature source Oral, resp. rate 18, height 5\' 7"  (1.702 m), weight 158 lb 12.8 oz (72.031 kg), SpO2 98 %. Body mass index is 24.87 kg/(m^2). General: Well developed, well nourished, in no acute distress. Head: Normocephalic, atraumatic, sclera non-icteric, no xanthomas, nares are without discharge. Neck: Negative for carotid bruits. JVP not elevated. Lungs: Decreased breath sounds bilaterally with rhonchi left lower lobe. Breathing is unlabored. Mild expiratory wheezing Heart: Irregularly-irregular, S1 S2 without murmurs, rubs, or gallops. Chest pain is reproducible to palpation.  Abdomen: Soft, non-tender, non-distended with normoactive bowel sounds. No rebound/guarding. Extremities: No clubbing or cyanosis. No  edema.  Neuro: Alert and oriented X 3. Moves all extremities spontaneously. Psych:  Responds to questions appropriately with a normal affect.   Intake/Output Summary (Last 24 hours) at 04/14/15 0957 Last data filed at 04/14/15 0947  Gross per 24 hour  Intake    120 ml  Output   3026 ml  Net  -2906 ml    Inpatient Medications:  . azithromycin  500 mg Oral Daily  . budesonide-formoterol  2 puff Inhalation BID  . carvedilol  6.25 mg Oral BID WC  . docusate sodium  100 mg Oral BID  . furosemide  40 mg Oral BID  . ipratropium-albuterol  3 mL Nebulization Once  . loratadine  10 mg Oral Daily  . pantoprazole  40 mg Oral Daily  . potassium chloride  20 mEq Oral BID  . predniSONE  60 mg Oral Q breakfast  . pyridOXINE  100 mg Oral Daily  . Rivaroxaban  15 mg Oral QHS  . sodium chloride  3 mL Intravenous Q12H  . tiotropium  18 mcg Inhalation Daily  . vitamin B-12  1,000 mcg Oral Daily   Infusions:    Labs:  Recent Labs  04/13/15 0353 04/14/15 0438  NA 138 138  K 4.9 4.9  CL 94* 97*  CO2 38* 38*  GLUCOSE 188* 213*  BUN 36* 37*  CREATININE 1.08* 1.06*  CALCIUM 9.1 8.9   No results for input(s): AST, ALT, ALKPHOS, BILITOT, PROT, ALBUMIN in the last 72 hours.  Recent Labs  04/13/15 0353  WBC 8.8  HGB 11.0*  HCT 33.6*  MCV 94.7  PLT 232    Recent Labs  04/12/15 0016 04/13/15 1425 04/14/15 0149  TROPONINI <0.03 <0.03 <0.03   Invalid input(s): POCBNP No results for input(s): HGBA1C in the last 72 hours.   Weights: [employer]  04/12/15 0442 04/13/15 0515 04/14/15 0639  Weight: 160 lb 8 oz (72.802 kg) 158 lb 9.6 oz (71.94 kg) 158 lb 12.8 oz (72.031 kg)     Radiology/Studies:  Dg Chest 1 View  04/13/2015  CLINICAL DATA:  Respiratory failure with hypoxia. Shortness of breath. Atrial fibrillation. EXAM: CHEST 1 VIEW COMPARISON:  04/10/2015 FINDINGS: The heart size and mediastinal contours are within normal limits. Both lungs are clear. Previously seen mild  left lower lower pole opacity no longer visualized. No evidence of pneumothorax or pleural effusion. Surgical clips again seen within the left axilla. IMPRESSION: No active disease. Electronically Signed   By: Earle Gell M.D.   On: 04/13/2015 12:23   Dg Chest 1 View  04/10/2015  CLINICAL DATA:  Shortness of breath, COPD. EXAM: CHEST 1 VIEW COMPARISON:  CT scan of the chest of March 07, 2015 FINDINGS: The lungs are adequately inflated. There is stable apical pleural thickening bilaterally. Subtle increased density at the left lung base is present with partial obscuration of the hemidiaphragm. There is no significant pleural effusion. The heart is top-normal in size. The central pulmonary vascularity is prominent but stable. The mediastinum is normal in width. The bony thorax exhibits no acute abnormality. IMPRESSION: Mild atelectasis or early pneumonia in the left lower lobe. There may be a trace of pleural fluid. There is no pulmonary edema. A PA and lateral chest x-ray would be useful when the patient can tolerate the procedure. Electronically Signed   By: David  Martinique M.D.   On: 04/10/2015 14:12   Ct Angio Chest Pe W/cm &/or Wo Cm  04/13/2015  CLINICAL DATA:  81 year old female with a history of mild nonobstructive coronary artery disease, chronic respiratory failure on home oxygen and COPD. Presents with increased shortness of breath. EXAM: CT ANGIOGRAPHY CHEST WITH CONTRAST TECHNIQUE: Multidetector CT imaging of the chest was performed using the standard protocol during bolus administration of intravenous contrast. Multiplanar CT image reconstructions and MIPs were obtained to evaluate the vascular anatomy. CONTRAST:  44mL OMNIPAQUE IOHEXOL 350 MG/ML SOLN COMPARISON:  Prior chest x-ray obtained earlier today; most recent prior CT scan of the chest 03/07/2015 FINDINGS: Mediastinum: Unremarkable CT appearance of the thyroid gland. No suspicious mediastinal or hilar adenopathy. No soft tissue  mediastinal mass. The thoracic esophagus is unremarkable. Heart/Vascular: Adequate opacification of the pulmonary arteries to the proximal subsegmental level. No evidence of a pulmonary embolus. Extensive atherosclerotic vascular calcifications throughout the aorta which remains normal in caliber. Cardiomegaly with both left and right heart enlargement. Atherosclerotic calcifications present along the course of the coronary arteries. No pericardial effusion. Lungs/Pleura: Small bilateral pleural effusions. Advanced predominantly centrilobular emphysema. Regions of honeycombing, subpleural reticulation and architectural distortion in both upper lungs. Diffuse mild bronchial wall thickening. The lungs are hyperinflated. Mild dependent atelectasis of the lower lobes from the pleural effusions. No focal airspace consolidation or pneumothorax. Previously noted focal opacity in the posterior aspect of the right lower lobe has resolved. There are several small scattered pulmonary nodules the largest of which measures 5 mm in the periphery of the right lower lobe. These remain essentially unchanged dating back to October of 2015. The largest nodule remains stable dating back to November of 2014. Two year stability is consistent with benignity. No overt pulmonary edema. Bones/Soft Tissues: No acute fracture or aggressive appearing lytic or blastic osseous lesion. Stable chronic L1 and L2 compression fractures. Upper Abdomen: Visualized upper abdominal organs are unremarkable. Review of the MIP images confirms the above findings. IMPRESSION:  1. Negative for acute PE, pneumonia or other acute cardiopulmonary process. 2. Stable cardiomegaly with both right and left heart enlargement. 3. Extensive atherosclerotic vascular calcifications including multivessel coronary artery calcifications. 4. Small bilateral pleural effusions with associated mild bibasilar atelectasis. 5. Pulmonary hyperinflation and advanced centrilobular  emphysema with diffuse central bronchitic change consistent with the clinical history of COPD. 6. Stable pleural parenchymal scarring versus early fibrotic change in both upper lungs. 7. Stable small scattered pulmonary nodules. 8. Stable chronic L1 and L2 compression fractures. Electronically Signed   By: Jacqulynn Cadet M.D.   On: 04/13/2015 16:49     Assessment and Plan  81 y.o. female with h/o mild nonobstructive CAD by cardiac catheterization in 1610, chronic systolic CHF due to recurrent stress-induced cardiomyopathy, chronic Afib on Xarelto, PAD s/p SFA stenting and left popliteal angioplasty in August 2016 by Dr. Ronalee Belts, COPD on home oxygen nightly, CKD stage II-III, HTN, and GERD who presented to [facility] on 10/31 with increased SOB and was found to have acute on chronic respiratory failure felt to be secondary to likely COPD exacerbation with component of acute on chronic diastolic CHF.   1. Acute on chronic respiratory failure: -Possibly multifactorial including COPD exacerbation and possible acute on chronic systolic CHF -Oxygen via nasal cannula remains at 3L currently  -Continue treatment for heart failure and COPD.  2. Mild acute on chronic systolic CHF: Echo showed normal LV systolic function with very mild pulmonary hypertension -Continue po Lasix 40 mg bid -Continue Coreg 6.25 mg bid -Limit IV fluids   3. COPD exacerbation: -Rocephin and azithromycin per IM - still with diminished breath sounds and exertional hypoxia.  - She probably will require inpatient rehab.   3. Chronic Afib: -Rate controlled -Coreg 6.25 mg bid -Back on Xarelto . Telemetry showed less than 2 second pauses. I do not recommend making any changes at the present time.   4. History of stress induced cardiomyopathy: -Check echo as above -Lasix as above -Limit IV fluids  5. Dysphagia: -Unable to undergo EGD 2/2 acute medical condition -Back on Xarelto as of 11/1 -Continue soft foods -Consider  apple sauce with her pills   6. Hypokalemia: -Resolved  7. HTN: -Controlled -Continue current medications   Signed, Kathlyn Sacramento, MD   04/14/2015, 9:57 AM

## 2015-04-14 NOTE — Telephone Encounter (Signed)
S/w pt friend, Charlcie Cradle, who was at pt home taking care of dog while pt is in hospital Pt discharged today. Per Parke Simmers, pt will be going to [facility] for a few weeks. Calling about upcoming appt 11/11 w/Dr. Fletcher Anon.  Will call [facility] next week regarding appt.

## 2015-04-18 DIAGNOSIS — I509 Heart failure, unspecified: Secondary | ICD-10-CM | POA: Diagnosis not present

## 2015-04-18 DIAGNOSIS — I4891 Unspecified atrial fibrillation: Secondary | ICD-10-CM | POA: Diagnosis not present

## 2015-04-18 DIAGNOSIS — K219 Gastro-esophageal reflux disease without esophagitis: Secondary | ICD-10-CM | POA: Diagnosis not present

## 2015-04-18 DIAGNOSIS — I2510 Atherosclerotic heart disease of native coronary artery without angina pectoris: Secondary | ICD-10-CM | POA: Diagnosis not present

## 2015-04-18 DIAGNOSIS — J449 Chronic obstructive pulmonary disease, unspecified: Secondary | ICD-10-CM | POA: Diagnosis not present

## 2015-04-18 NOTE — Telephone Encounter (Signed)
S/w Maudie Mercury, RN at [facility], of upcoming appt. Confirmed 11/11 OV w/Dr. Fletcher Anon, 2:45pm.

## 2015-04-20 ENCOUNTER — Telehealth: Payer: Self-pay | Admitting: Family Medicine

## 2015-04-20 NOTE — Telephone Encounter (Signed)
Parke Simmers said pt was in rehab at [facility] and should be released next week.. She cancelled pt's appt for tomorrow because pt has appt with heart doctor tomorrow afternoon.  Pt will keep the appt already scheduled on 11/21.

## 2015-04-21 ENCOUNTER — Ambulatory Visit: Payer: Medicare Other | Admitting: *Deleted

## 2015-04-21 ENCOUNTER — Encounter: Payer: Self-pay | Admitting: Cardiovascular Disease

## 2015-04-21 ENCOUNTER — Inpatient Hospital Stay: Payer: Medicare Other | Admitting: Family Medicine

## 2015-04-21 ENCOUNTER — Ambulatory Visit (INDEPENDENT_AMBULATORY_CARE_PROVIDER_SITE_OTHER): Payer: Medicare Other | Admitting: Cardiovascular Disease

## 2015-04-21 VITALS — BP 110/58 | HR 70 | Ht 67.0 in | Wt 156.0 lb

## 2015-04-21 DIAGNOSIS — I5022 Chronic systolic (congestive) heart failure: Secondary | ICD-10-CM | POA: Diagnosis not present

## 2015-04-21 DIAGNOSIS — R0602 Shortness of breath: Secondary | ICD-10-CM | POA: Diagnosis not present

## 2015-04-21 DIAGNOSIS — I481 Persistent atrial fibrillation: Secondary | ICD-10-CM | POA: Diagnosis not present

## 2015-04-21 DIAGNOSIS — I5021 Acute systolic (congestive) heart failure: Secondary | ICD-10-CM

## 2015-04-21 DIAGNOSIS — I4819 Other persistent atrial fibrillation: Secondary | ICD-10-CM

## 2015-04-21 DIAGNOSIS — I10 Essential (primary) hypertension: Secondary | ICD-10-CM

## 2015-04-21 NOTE — Patient Instructions (Signed)
Medication Instructions: Continue same medications.   Labwork: None.   Procedures/Testing: None.   Follow-Up: 2 months with Dr. Fletcher Anon  Any Additional Special Instructions Will Be Listed Below (If Applicable).

## 2015-04-23 NOTE — Assessment & Plan Note (Signed)
She is doing very well overall and appears to be euvolemic on current dose of Lasix.

## 2015-04-23 NOTE — Assessment & Plan Note (Signed)
Ventricular rate is controlled with carvedilol. She is tolerating anticoagulation with no reported side effects.

## 2015-04-23 NOTE — Progress Notes (Signed)
Primary care physician: Dr. Luan Pulling  HPI  This is a pleasant 81 -year-old female who is here today for a followup visit for chronic systolic heart failure due to recurrent stress-induced cardiomyopathy,chronic atrial fibrillation, PAD and COPD. Previous cardiac catheterization in 2014 showed mild nonobstructive coronary artery disease.  she was hospitalized recently at [facility] for acute on chronic heart failure and COPD exacerbation. She improved with steroids and diuresis. She tends to improve with prednisone he had she was discharged to short-term rehabilitation and has been progressing very well. She reports no chest pain. Shortness of breath is gradually improving.   Allergies  Allergen Reactions  . Penicillins Itching, Swelling, Rash and Other (See Comments)    Pt is unable to answer additional questions about this medication because it happened so long ago.  [name] [Levofloxacin In D5w]   . Sulfa Antibiotics Other (See Comments)    Reaction:  Unknown      Current Outpatient Prescriptions on File Prior to Visit  Medication Sig Dispense Refill  . albuterol (PROVENTIL HFA;VENTOLIN HFA) 108 (90 BASE) MCG/ACT inhaler Inhale 2 puffs into the lungs every 4 (four) hours as needed for wheezing or shortness of breath.    [name] albuterol (PROVENTIL) (2.5 MG/3ML) 0.083% nebulizer solution Take 2.5 mg by nebulization 4 (four) times daily as needed for wheezing or shortness of breath.    . carvedilol (COREG) 6.25 MG tablet Take 6.25 mg by mouth 2 (two) times daily with a meal.    . Fluticasone-Salmeterol (ADVAIR) 250-50 MCG/DOSE AEPB Inhale 1 puff into the lungs 2 (two) times daily.    . furosemide (LASIX) 40 MG tablet Take 40 mg by mouth 2 (two) times daily.     [name] loratadine (CLARITIN) 10 MG tablet Take 10 mg by mouth daily.    [name] omeprazole (PRILOSEC) 20 MG capsule Take 1 capsule (20 mg total) by mouth daily. 30 capsule 11  . predniSONE (DELTASONE) 10 MG tablet Take 1 tablet (10 mg total) by  mouth daily with breakfast. 30 tablet 0  . pyridOXINE (VITAMIN B-6) 100 MG tablet Take 100 mg by mouth daily.    . ranitidine (ZANTAC) 150 MG capsule TAKE 1 CAPSULE BY MOUTH EVERY NIGHT  6  . Rivaroxaban (XARELTO) 15 MG TABS tablet Take 15 mg by mouth at bedtime.    [name] tiotropium (SPIRIVA) 18 MCG inhalation capsule Place 18 mcg into inhaler and inhale daily.    . vitamin B-12 (CYANOCOBALAMIN) 1000 MCG tablet Take 1,000 mcg by mouth daily.     No current facility-administered medications on file prior to visit.     Past Medical History  Diagnosis Date  . HTN (hypertension)   . COPD (chronic obstructive pulmonary disease) (HCC)     a. on home O2 nightly.  . Diverticulosis   . Takotsubo cardiomyopathy 10/2012    a. 10/2012: presented w/ cardiogenic shock/systolic CHF/VDRF - EF [id number] by cath, 40% by f/u echo - cath with mild nonobstructive CAD. b. Not discharged on ACEI due to hypotension.  . Cardiogenic shock ([place])     a. 10/2012 - due to Takotsubo CM.  [name] Respiratory failure ([place])     a. On home O2 nightly. b. VDRF 10/2012 - due to CAP/pulm edema in setting of cardiogenic shock/Takotsubo CM.  [name] CAP (community acquired pneumonia)     a. 10/2012 - will need f/u CXR in June 2014 to ensure clearance.  . Esophageal stricture     a. s/p dilitation 10/2012.  [name] Pleural  effusion, right     a. s/p thoracentesis 10/28/12.  . Atrial fibrillation ([place])     a. Dx 10/2012, rate controlled, started on Xarelto.  . Claudication ([place])     a. ABI 10/2012 - R 0.87, L 0.74.  [name] GERD (gastroesophageal reflux disease)   . CKD (chronic kidney disease), stage III   . Breast CA (HCC)     hx  . Abdominal hernia     chronic  . STEMI (ST elevation myocardial infarction) ([place])   . Cellulitis   . CHF (congestive heart failure) ([place])   . CAD (coronary artery disease)     a. 10/2012 - presented w/ STEMI felt due to Takotsubo - mild nonobstructive dz by cath.     Past Surgical History  Procedure Laterality Date  .  Mastectomy    . Cataract extraction    . Esophagogastroduodenoscopy N/A 11/05/2012    Procedure: ESOPHAGOGASTRODUODENOSCOPY (EGD) with esophageal savary dilatation;  Surgeon: Missy Sabins, MD;  Location: [place];  Service: Endoscopy;  Laterality: N/A;  savary dil...no floro needed  . Bladder surgery    . Left heart catheterization with coronary angiogram N/A 10/24/2012    Procedure: LEFT HEART CATHETERIZATION WITH CORONARY ANGIOGRAM;  Surgeon: Sherren Mocha, MD;  Location: [facility] CATH LAB;  Service: Cardiovascular;  Laterality: N/A;  . Peripheral vascular catheterization N/A 02/07/2015    Procedure: Abdominal Aortogram w/Lower Extremity;  Surgeon: Katha Cabal, MD;  Location: [place] CV LAB;  Service: Cardiovascular;  Laterality: N/A;  . Peripheral vascular catheterization  02/07/2015    Procedure: Lower Extremity Intervention;  Surgeon: Katha Cabal, MD;  Location: [place] CV LAB;  Service: Cardiovascular;;  . Coronary angioplasty       Family History  Problem Relation Age of Onset  . Heart attack Mother      Social History   Social History  . Marital Status: Widowed    Spouse Name: N/A  . Number of Children: 1  . Years of Education: N/A   Occupational History  . Not on file.   Social History Main Topics  . Smoking status: Former Smoker -- 1.00 packs/day for 55 years    Types: Cigarettes  . Smokeless tobacco: Former Systems developer  . Alcohol Use: No  . Drug Use: No  . Sexual Activity: No   Other Topics Concern  . Not on file   Social History Narrative     PHYSICAL EXAM   BP 110/58 mmHg  Pulse 70  Ht 5\' 7"  (1.702 m)  Wt 156 lb (70.761 kg)  BMI 24.43 kg/m2 Constitutional: She is oriented to person, place, and time. She appears well-developed and well-nourished. No distress.  HENT: No nasal discharge.  Head: Normocephalic and atraumatic.  Eyes: Pupils are equal and round.  Neck: Normal range of motion. Neck supple. No JVD present. No thyromegaly  present.  Cardiovascular: Normal rate, irregular rhythm, normal heart sounds. Exam reveals no gallop and no friction rub. No murmur heard.  Pulmonary/Chest: Effort normal . Very few crackles at the base. No stridor. No respiratory distress. She has no wheezes. She has no rales. She exhibits no tenderness.  Abdominal: Soft. Bowel sounds are normal. She exhibits no distension. There is no tenderness. There is no rebound and no guarding.  Musculoskeletal: Normal range of motion. She exhibits trace edema and no tenderness.  Neurological: She is alert and oriented to person, place, and time. Coordination normal.  Skin: There is a small wound on the right shin which  has healed nicely. No induration or warmth.  Psychiatric: She has a normal mood and affect. Her behavior is normal. Judgment and thought content normal.     EKG:  Atrial fibrillation  ABNORMAL RHYTHM   ASSESSMENT AND PLAN

## 2015-04-23 NOTE — Assessment & Plan Note (Signed)
Blood pressure is well controlled on current medications. 

## 2015-04-24 ENCOUNTER — Other Ambulatory Visit: Payer: Medicare Other | Admitting: *Deleted

## 2015-04-25 ENCOUNTER — Other Ambulatory Visit: Payer: Self-pay | Admitting: *Deleted

## 2015-04-25 DIAGNOSIS — J449 Chronic obstructive pulmonary disease, unspecified: Secondary | ICD-10-CM | POA: Diagnosis not present

## 2015-04-25 DIAGNOSIS — I4891 Unspecified atrial fibrillation: Secondary | ICD-10-CM | POA: Diagnosis not present

## 2015-04-25 DIAGNOSIS — I509 Heart failure, unspecified: Secondary | ICD-10-CM | POA: Diagnosis not present

## 2015-04-25 DIAGNOSIS — K219 Gastro-esophageal reflux disease without esophagitis: Secondary | ICD-10-CM | POA: Diagnosis not present

## 2015-04-25 DIAGNOSIS — I10 Essential (primary) hypertension: Secondary | ICD-10-CM | POA: Diagnosis not present

## 2015-04-25 NOTE — Patient Outreach (Signed)
[facility]) Care Management  [facility] Social Work  04/25/2015  [name] [name] 02-10-1923 [id number]  Subjective:  Patient is a 81 year old female, states that she lives alone.  " I have a  daughter in law and family  friend that take good care of me at home"  Objective:   Current Medications:  Current Outpatient Prescriptions  Medication Sig Dispense Refill  . albuterol (PROVENTIL HFA;VENTOLIN HFA) 108 (90 BASE) MCG/ACT inhaler Inhale 2 puffs into the lungs every 4 (four) hours as needed for wheezing or shortness of breath.    [name] albuterol (PROVENTIL) (2.5 MG/3ML) 0.083% nebulizer solution Take 2.5 mg by nebulization 4 (four) times daily as needed for wheezing or shortness of breath.    . carvedilol (COREG) 6.25 MG tablet Take 6.25 mg by mouth 2 (two) times daily with a meal.    . Fluticasone-Salmeterol (ADVAIR) 250-50 MCG/DOSE AEPB Inhale 1 puff into the lungs 2 (two) times daily.    . furosemide (LASIX) 40 MG tablet Take 40 mg by mouth 2 (two) times daily.     [name] loratadine (CLARITIN) 10 MG tablet Take 10 mg by mouth daily.    [name] omeprazole (PRILOSEC) 20 MG capsule Take 1 capsule (20 mg total) by mouth daily. 30 capsule 11  . ranitidine (ZANTAC) 150 MG capsule TAKE 1 CAPSULE BY MOUTH EVERY NIGHT  6  . Rivaroxaban (XARELTO) 15 MG TABS tablet Take 15 mg by mouth at bedtime.    [name] tiotropium (SPIRIVA) 18 MCG inhalation capsule Place 18 mcg into inhaler and inhale daily.    . vitamin B-12 (CYANOCOBALAMIN) 1000 MCG tablet Take 1,000 mcg by mouth daily.    . predniSONE (DELTASONE) 10 MG tablet Take 1 tablet (10 mg total) by mouth daily with breakfast. 30 tablet 0  . pyridOXINE (VITAMIN B-6) 100 MG tablet Take 100 mg by mouth daily.     No current facility-administered medications for this visit.    Functional Status:  In your present state of health, do you have any difficulty performing the following activities: 04/24/2015 04/10/2015  Hearing? [place]? Y -  Difficulty  concentrating or making decisions? Y -  Walking or climbing stairs? N -  Dressing or bathing? N -  Doing errands, shopping? Y N  Preparing Food and eating ? N -  Using the Toilet? N -  In the past six months, have you accidently leaked urine? Y -  Do you have problems with loss of bowel control? N -  Managing your Medications? N -  Managing your Finances? Y -  Housekeeping or managing your Housekeeping? Y -    Fall/Depression Screening:  PHQ 2/9 Scores 04/24/2015 03/15/2015 12/28/2014 12/23/2014  PHQ - 2 Score 0 0 0 0    Assessment:  This Education officer, museum met with patient at her bedside at [employer].  Patient was very friendly and engaging during the visit, stating that she is set to discharge home on 04/28/15.  She already had [facility] working with her before her admission to [facility] and their care will resume once she is discharged.  Patient described a strong family support system, stating that she has a daughter in law and family friend who are very helpful with her household needs, transportation to medical appointments and managing her finances.  Per patient, she manages her own medications and takes care of her own adl's.  This social worker made several attempts to speak with discharge planner, however she was  unavailable.  Plan:  This social worker will inform RNCM of discharge date for 04/28/15.             This social worker will make attempt to contact discharge by phone.    [facility] CM Care Plan Problem One        Most Recent Value   Care Plan Problem One  patient currently in a skilled nursing faciliity   Role Documenting the Problem One  Clinical Social Worker   Care Plan for Problem One  Active   [facility] CM Short Term Goal #2 (0-30 days)  patient will activley participate in physical and occupational therapy for  the next 30 days   [facility] CM Short Term Goal #2 Start Date  04/24/15   Interventions for Short Term Goal #2  CSW encouraged active participation in physical and  occupational therapy  in preparation for discharge home    [facility] CM Care Plan Problem Two        Most Recent Value   Care Plan Problem Two  patient in skilled nursing home   Role Documenting the Problem Two  Clinical Social Worker   Care Plan for Problem Two  Active   [facility] CM Short Term Goal #2 (0-30 days)  patient and social worker will collaborate with the discharge planner to ensure a safe and stable discharge on 04/28/15   [facility] CM Short Term Goal #2 Start Date  04/24/15   Interventions for Short Term Goal #2  CSW made several attempts to speak with discharrge planner during the visit however was no successful     [facility]alyn [facility] Care Management [phone]

## 2015-04-25 NOTE — Patient Outreach (Signed)
[facility]) Care Management  04/25/2015  CHABLIS TUFTS 1922-07-27 [id number]   Phone call from discharge planner Vallarie Mare from [facility] who confirmed discharge date of 04/28/15 for patient to return home with home health physical therapy with [facility].  Per discharge planner, no barriers to her discharge seen, patient has good family support.    Sheralyn Boatman [facility] Care Management [phone]

## 2015-04-29 DIAGNOSIS — J441 Chronic obstructive pulmonary disease with (acute) exacerbation: Secondary | ICD-10-CM | POA: Diagnosis not present

## 2015-04-29 DIAGNOSIS — Z7901 Long term (current) use of anticoagulants: Secondary | ICD-10-CM | POA: Diagnosis not present

## 2015-04-29 DIAGNOSIS — J440 Chronic obstructive pulmonary disease with acute lower respiratory infection: Secondary | ICD-10-CM | POA: Diagnosis not present

## 2015-04-29 DIAGNOSIS — N183 Chronic kidney disease, stage 3 (moderate): Secondary | ICD-10-CM | POA: Diagnosis not present

## 2015-04-29 DIAGNOSIS — I2510 Atherosclerotic heart disease of native coronary artery without angina pectoris: Secondary | ICD-10-CM | POA: Diagnosis not present

## 2015-04-29 DIAGNOSIS — K219 Gastro-esophageal reflux disease without esophagitis: Secondary | ICD-10-CM | POA: Diagnosis not present

## 2015-04-29 DIAGNOSIS — E1151 Type 2 diabetes mellitus with diabetic peripheral angiopathy without gangrene: Secondary | ICD-10-CM | POA: Diagnosis not present

## 2015-04-29 DIAGNOSIS — Z9981 Dependence on supplemental oxygen: Secondary | ICD-10-CM | POA: Diagnosis not present

## 2015-04-29 DIAGNOSIS — E1122 Type 2 diabetes mellitus with diabetic chronic kidney disease: Secondary | ICD-10-CM | POA: Diagnosis not present

## 2015-04-29 DIAGNOSIS — I129 Hypertensive chronic kidney disease with stage 1 through stage 4 chronic kidney disease, or unspecified chronic kidney disease: Secondary | ICD-10-CM | POA: Diagnosis not present

## 2015-04-29 DIAGNOSIS — I5023 Acute on chronic systolic (congestive) heart failure: Secondary | ICD-10-CM | POA: Diagnosis not present

## 2015-04-29 DIAGNOSIS — J189 Pneumonia, unspecified organism: Secondary | ICD-10-CM | POA: Diagnosis not present

## 2015-04-29 DIAGNOSIS — Z853 Personal history of malignant neoplasm of breast: Secondary | ICD-10-CM | POA: Diagnosis not present

## 2015-05-01 ENCOUNTER — Ambulatory Visit (INDEPENDENT_AMBULATORY_CARE_PROVIDER_SITE_OTHER): Payer: Medicare Other | Admitting: Family Medicine

## 2015-05-01 ENCOUNTER — Encounter: Payer: Self-pay | Admitting: Family Medicine

## 2015-05-01 ENCOUNTER — Other Ambulatory Visit: Payer: Self-pay | Admitting: *Deleted

## 2015-05-01 VITALS — BP 107/63 | HR 78 | Temp 98.1°F | Resp 16 | Ht 67.0 in | Wt 160.2 lb

## 2015-05-01 DIAGNOSIS — J9621 Acute and chronic respiratory failure with hypoxia: Secondary | ICD-10-CM | POA: Diagnosis not present

## 2015-05-01 DIAGNOSIS — K222 Esophageal obstruction: Secondary | ICD-10-CM

## 2015-05-01 DIAGNOSIS — I10 Essential (primary) hypertension: Secondary | ICD-10-CM | POA: Diagnosis not present

## 2015-05-01 DIAGNOSIS — I5022 Chronic systolic (congestive) heart failure: Secondary | ICD-10-CM | POA: Diagnosis not present

## 2015-05-01 DIAGNOSIS — H6123 Impacted cerumen, bilateral: Secondary | ICD-10-CM | POA: Diagnosis not present

## 2015-05-01 DIAGNOSIS — K2100 Gastro-esophageal reflux disease with esophagitis, without bleeding: Secondary | ICD-10-CM

## 2015-05-01 MED ORDER — PREDNISONE 10 MG PO TABS: ORAL_TABLET | ORAL | Status: DC

## 2015-05-01 NOTE — Patient Outreach (Addendum)
RNCM called pt as part of the transition of care program. Unsuccessful in reaching pt, [name] [name] called per pt's consent form. [name] pt's DIL completed the transition of care flow sheet. RNCM explained to daughter in law the purpose of the transition care program and RNCM would be calling pt again next week.    Plan: RNCM will contact pt next week.   Rutherford Limerick RN, BSN  [facility] Care Management [phone])

## 2015-05-01 NOTE — Patient Instructions (Signed)
OK to have EGD done for esophageal stricture by Dr, Allen Norris.

## 2015-05-01 NOTE — Progress Notes (Signed)
Name: [name] [name]   MRN: [id number]    DOB: March 26, 1923   Date:05/01/2015       Progress Note  Subjective  Chief Complaint  Chief Complaint  Patient presents with  . Hypertension  . Cerumen Impaction    HPI Here for f/u of HBP.  She has had COPD and is on prednisone at present.  She still needs further diatation of esophageal stricture done.  She is breathing OK and feels pretty well.  She is still having some food sticking in esophagus.   She is still on daily Prednisone, 10 mg/d. No problem-specific assessment & plan notes found for this encounter.   Past Medical History  Diagnosis Date  . HTN (hypertension)   . COPD (chronic obstructive pulmonary disease) (HCC)     a. on home O2 nightly.  . Diverticulosis   . Takotsubo cardiomyopathy 10/2012    a. 10/2012: presented w/ cardiogenic shock/systolic CHF/VDRF - EF 65% by cath, 40% by f/u echo - cath with mild nonobstructive CAD. b. Not discharged on ACEI due to hypotension.  . Cardiogenic shock ([place])     a. 10/2012 - due to Takotsubo CM.  [name] Respiratory failure ([place])     a. On home O2 nightly. b. VDRF 10/2012 - due to CAP/pulm edema in setting of cardiogenic shock/Takotsubo CM.  [name] CAP (community acquired pneumonia)     a. 10/2012 - will need f/u CXR in June 2014 to ensure clearance.  . Esophageal stricture     a. s/p dilitation 10/2012.  [name] Pleural effusion, right     a. s/p thoracentesis 10/28/12.  . Atrial fibrillation ([place])     a. Dx 10/2012, rate controlled, started on Xarelto.  . Claudication ([place])     a. ABI 10/2012 - R 0.87, L 0.74.  [name] GERD (gastroesophageal reflux disease)   . CKD (chronic kidney disease), stage III   . Breast CA (HCC)     hx  . Abdominal hernia     chronic  . STEMI (ST elevation myocardial infarction) ([place])   . Cellulitis   . CHF (congestive heart failure) ([place])   . CAD (coronary artery disease)     a. 10/2012 - presented w/ STEMI felt due to Takotsubo - mild nonobstructive dz by cath.    Past Surgical  History  Procedure Laterality Date  . Mastectomy    . Cataract extraction    . Esophagogastroduodenoscopy N/A 11/05/2012    Procedure: ESOPHAGOGASTRODUODENOSCOPY (EGD) with esophageal savary dilatation;  Surgeon: Missy Sabins, MD;  Location: [place];  Service: Endoscopy;  Laterality: N/A;  savary dil...no floro needed  . Bladder surgery    . Left heart catheterization with coronary angiogram N/A 10/24/2012    Procedure: LEFT HEART CATHETERIZATION WITH CORONARY ANGIOGRAM;  Surgeon: Sherren Mocha, MD;  Location: [facility] CATH LAB;  Service: Cardiovascular;  Laterality: N/A;  . Peripheral vascular catheterization N/A 02/07/2015    Procedure: Abdominal Aortogram w/Lower Extremity;  Surgeon: Katha Cabal, MD;  Location: [place] CV LAB;  Service: Cardiovascular;  Laterality: N/A;  . Peripheral vascular catheterization  02/07/2015    Procedure: Lower Extremity Intervention;  Surgeon: Katha Cabal, MD;  Location: [place] CV LAB;  Service: Cardiovascular;;  . Coronary angioplasty      Family History  Problem Relation Age of Onset  . Heart attack Mother     Social History   Social History  . Marital Status: Widowed    Spouse Name: N/A  . Number of  Children: 1  . Years of Education: N/A   Occupational History  . Not on file.   Social History Main Topics  . Smoking status: Former Smoker -- 1.00 packs/day for 55 years    Types: Cigarettes  . Smokeless tobacco: Former Systems developer  . Alcohol Use: No  . Drug Use: No  . Sexual Activity: No   Other Topics Concern  . Not on file   Social History Narrative     Current outpatient prescriptions:  .  albuterol (PROVENTIL HFA;VENTOLIN HFA) 108 (90 BASE) MCG/ACT inhaler, Inhale 2 puffs into the lungs every 4 (four) hours as needed for wheezing or shortness of breath., Disp: , Rfl:  .  albuterol (PROVENTIL) (2.5 MG/3ML) 0.083% nebulizer solution, Take 2.5 mg by nebulization 4 (four) times daily as needed for wheezing or shortness of  breath., Disp: , Rfl:  .  carvedilol (COREG) 6.25 MG tablet, Take 6.25 mg by mouth 2 (two) times daily with a meal., Disp: , Rfl:  .  Fluticasone-Salmeterol (ADVAIR) 250-50 MCG/DOSE AEPB, Inhale 1 puff into the lungs 2 (two) times daily., Disp: , Rfl:  .  furosemide (LASIX) 40 MG tablet, Take 40 mg by mouth 2 (two) times daily. , Disp: , Rfl:  .  loratadine (CLARITIN) 10 MG tablet, Take 10 mg by mouth daily., Disp: , Rfl:  .  omeprazole (PRILOSEC) 20 MG capsule, Take 1 capsule (20 mg total) by mouth daily., Disp: 30 capsule, Rfl: 11 .  predniSONE (DELTASONE) 10 MG tablet, Take 1/2 tablet a day for 1 week as per schedule proivided/, Disp: 30 tablet, Rfl: 0 .  pyridOXINE (VITAMIN B-6) 100 MG tablet, Take 100 mg by mouth daily., Disp: , Rfl:  .  ranitidine (ZANTAC) 150 MG tablet, , Disp: , Rfl:  .  Rivaroxaban (XARELTO) 15 MG TABS tablet, Take 15 mg by mouth at bedtime., Disp: , Rfl:  .  tiotropium (SPIRIVA) 18 MCG inhalation capsule, Place 18 mcg into inhaler and inhale daily., Disp: , Rfl:  .  vitamin B-12 (CYANOCOBALAMIN) 1000 MCG tablet, Take 1,000 mcg by mouth daily., Disp: , Rfl:   Allergies  Allergen Reactions  . Penicillins Itching, Swelling, Rash and Other (See Comments)    Pt is unable to answer additional questions about this medication because it happened so long ago.  [name] [Levofloxacin In D5w]   . Sulfa Antibiotics Other (See Comments)    Reaction:  Unknown      Review of Systems  Constitutional: Negative for fever, chills, weight loss and malaise/fatigue.  HENT: Negative for hearing loss.   Eyes: Negative for blurred vision and double vision.  Respiratory: Positive for shortness of breath (better than she was in hospital.). Negative for cough and wheezing.   Cardiovascular: Negative for chest pain, palpitations and leg swelling.  Gastrointestinal: Negative for heartburn, abdominal pain and blood in stool.       Still with food slowed movement in esophagus.   Genitourinary: Negative for dysuria, urgency and frequency.  Musculoskeletal: Negative for myalgias and joint pain.  Skin: Negative for rash.  Neurological: Negative for dizziness, tremors, weakness and headaches.      Objective  Filed Vitals:   05/01/15 1323  BP: 107/63  Pulse: 78  Temp: 98.1 F (36.7 C)  Resp: 16  Height: _0  (1.702 m)  Weight: 160 lb 3.2 oz (72.666 kg)    Physical Exam  Constitutional: She is well-developed, well-nourished, and in no distress. No distress.  HENT:  Head: Normocephalic and atraumatic.  Some mild cerumen impaction bilateally  Eyes: Conjunctivae and EOM are normal. Pupils are equal, round, and reactive to light.  Cardiovascular: Normal rate, regular rhythm and normal heart sounds.  Exam reveals no gallop and no friction rub.   No murmur heard. Pulmonary/Chest: Effort normal and breath sounds normal. No respiratory distress. She has no wheezes. She has no rales.  Decreased breath sounds throughout.  Musculoskeletal: She exhibits no edema.  Vitals reviewed.      Recent Results (from the past 2160 hour(s))  BUN     Status: Abnormal   Collection Time: 02/07/15  7:59 AM  Result Value Ref Range   BUN 21 (H) 6 - 20 mg/dL  Creatinine, serum     Status: Abnormal   Collection Time: 02/07/15  7:59 AM  Result Value Ref Range   Creatinine, Ser 1.25 (H) 0.44 - 1.00 mg/dL   GFR calc non Af Amer 37 (L) >60 mL/min   GFR calc Af Amer 43 (L) >60 mL/min    Comment: (NOTE) The eGFR has been calculated using the CKD EPI equation. This calculation has not been validated in all clinical situations. eGFR's persistently <60 mL/min signify possible Chronic Kidney Disease.   Protime-INR     Status: None   Collection Time: 02/07/15  7:59 AM  Result Value Ref Range   Prothrombin Time 13.1 11.4 - 15.0 seconds   INR 0.97   Urine Culture     Status: None   Collection Time: 02/16/15 12:00 AM  Result Value Ref Range   Urine Culture, Routine Final  report    Urine Culture result 1 Comment     Comment: Mixed urogenital flora 10,000-25,000 colony forming units per mL   POCT Urinalysis Dipstick     Status: Abnormal   Collection Time: 02/16/15  9:59 AM  Result Value Ref Range   Color, UA yellow    Clarity, UA clear    Glucose, UA negative    Bilirubin, UA negative    Ketones, UA negative    Spec Grav, UA 1.010    Blood, UA trace    pH, UA 5.0    Protein, UA negative    Urobilinogen, UA negative    Nitrite, UA negative    Leukocytes, UA Negative Negative  Troponin I     Status: Abnormal   Collection Time: 03/03/15  3:01 PM  Result Value Ref Range   Troponin I 0.05 (H) <0.031 ng/mL    Comment: READ BACK AND VERIFIED WITH CASEY PIERCE AT 1555 03/03/15 BY [facility]        PERSISTENTLY INCREASED TROPONIN VALUES IN THE RANGE OF 0.04-0.49 ng/mL CAN BE SEEN IN:       -UNSTABLE ANGINA       -CONGESTIVE HEART FAILURE       -MYOCARDITIS       -CHEST TRAUMA       -ARRYHTHMIAS       -LATE PRESENTING MYOCARDIAL INFARCTION       -COPD   CLINICAL FOLLOW-UP RECOMMENDED.   Comprehensive metabolic panel     Status: Abnormal   Collection Time: 03/03/15  3:01 PM  Result Value Ref Range   Sodium 133 (L) 135 - 145 mmol/L   Potassium 3.4 (L) 3.5 - 5.1 mmol/L   Chloride 87 (L) 101 - 111 mmol/L   CO2 33 (H) 22 - 32 mmol/L   Glucose, Bld 159 (H) 65 - 99 mg/dL   BUN 18 6 - 20 mg/dL   Creatinine, Ser 1.17 (  H) 0.44 - 1.00 mg/dL   Calcium 8.7 (L) 8.9 - 10.3 mg/dL   Total Protein 7.8 6.5 - 8.1 g/dL   Albumin 3.6 3.5 - 5.0 g/dL   AST 22 15 - 41 U/L   ALT 13 (L) 14 - 54 U/L   Alkaline Phosphatase 61 38 - 126 U/L   Total Bilirubin 1.1 0.3 - 1.2 mg/dL   GFR calc non Af Amer 40 (L) >60 mL/min   GFR calc Af Amer 46 (L) >60 mL/min    Comment: (NOTE) The eGFR has been calculated using the CKD EPI equation. This calculation has not been validated in all clinical situations. eGFR's persistently <60 mL/min signify possible Chronic Kidney Disease.     Anion gap 13 5 - 15  CBC     Status: Abnormal   Collection Time: 03/03/15  3:01 PM  Result Value Ref Range   WBC 11.5 (H) 3.6 - 11.0 K/uL   RBC 3.70 (L) 3.80 - 5.20 MIL/uL   Hemoglobin 11.7 (L) 12.0 - 16.0 g/dL   HCT 34.7 (L) 35.0 - 47.0 %   MCV 93.8 80.0 - 100.0 fL   MCH 31.5 26.0 - 34.0 pg   MCHC 33.6 32.0 - 36.0 g/dL   RDW 14.0 11.5 - 14.5 %   Platelets 185 150 - 440 K/uL  Brain natriuretic peptide     Status: Abnormal   Collection Time: 03/03/15  5:15 PM  Result Value Ref Range   B Natriuretic Peptide 241.0 (H) 0.0 - 100.0 pg/mL  Blood gas, arterial     Status: Abnormal   Collection Time: 03/03/15  5:30 PM  Result Value Ref Range   FIO2 0.28    Delivery systems NASAL CANNULA    pH, Arterial 7.49 (H) 7.350 - 7.450   pCO2 arterial 58 (H) 32.0 - 48.0 mmHg   pO2, Arterial 76 (L) 83.0 - 108.0 mmHg   Bicarbonate 44.2 (H) 21.0 - 28.0 mEq/L   Acid-Base Excess 18.0 (H) 0.0 - 3.0 mmol/L   O2 Saturation 96.1 %   Patient temperature 37.0    Collection site RIGHT RADIAL    Sample type ARTERIAL DRAW    Allens test (pass/fail) YES (A) PASS  MRSA PCR Screening     Status: None   Collection Time: 03/04/15  8:30 AM  Result Value Ref Range   MRSA by PCR NEGATIVE NEGATIVE    Comment:        The GeneXpert MRSA Assay (FDA approved for NASAL specimens only), is one component of a comprehensive MRSA colonization surveillance program. It is not intended to diagnose MRSA infection nor to guide or monitor treatment for MRSA infections.   Troponin I     Status: None   Collection Time: 03/06/15 11:53 AM  Result Value Ref Range   Troponin I <0.03 <0.031 ng/mL    Comment:        NO INDICATION OF MYOCARDIAL INJURY.   Creatinine, serum     Status: Abnormal   Collection Time: 03/06/15 11:53 AM  Result Value Ref Range   Creatinine, Ser 1.26 (H) 0.44 - 1.00 mg/dL   GFR calc non Af Amer 36 (L) >60 mL/min   GFR calc Af Amer 42 (L) >60 mL/min    Comment: (NOTE) The eGFR has been calculated  using the CKD EPI equation. This calculation has not been validated in all clinical situations. eGFR's persistently <60 mL/min signify possible Chronic Kidney Disease.   Troponin I     Status: None  Collection Time: 03/06/15  3:05 PM  Result Value Ref Range   Troponin I <0.03 <0.031 ng/mL    Comment:        NO INDICATION OF MYOCARDIAL INJURY.   Troponin I     Status: None   Collection Time: 03/06/15  7:15 PM  Result Value Ref Range   Troponin I <0.03 <0.031 ng/mL    Comment:        NO INDICATION OF MYOCARDIAL INJURY.   CBC     Status: None   Collection Time: 03/07/15  9:52 AM  Result Value Ref Range   WBC 9.9 3.6 - 11.0 K/uL   RBC 4.08 3.80 - 5.20 MIL/uL   Hemoglobin 12.5 12.0 - 16.0 g/dL   HCT 38.4 35.0 - 47.0 %   MCV 94.2 80.0 - 100.0 fL   MCH 30.7 26.0 - 34.0 pg   MCHC 32.6 32.0 - 36.0 g/dL   RDW 13.8 11.5 - 14.5 %   Platelets 237 150 - 440 K/uL  Urine culture     Status: None   Collection Time: 03/28/15 12:00 AM  Result Value Ref Range   Urine Culture, Routine Final report    Urine Culture result 1 Comment     Comment: Mixed urogenital flora Greater than 100,000 colony forming units per mL   POCT Urinalysis Dipstick     Status: Abnormal   Collection Time: 03/28/15 10:10 AM  Result Value Ref Range   Color, UA yellow    Clarity, UA clear    Glucose, UA negative    Bilirubin, UA negative    Ketones, UA negative    Spec Grav, UA 1.010    Blood, UA trace    pH, UA 5.0    Protein, UA negative    Urobilinogen, UA negative    Nitrite, UA negative    Leukocytes, UA Trace (A) Negative  CBC with Differential     Status: Abnormal   Collection Time: 04/10/15  2:09 PM  Result Value Ref Range   WBC 7.1 3.6 - 11.0 K/uL   RBC 3.70 (L) 3.80 - 5.20 MIL/uL   Hemoglobin 11.4 (L) 12.0 - 16.0 g/dL   HCT 34.9 (L) 35.0 - 47.0 %   MCV 94.2 80.0 - 100.0 fL   MCH 30.9 26.0 - 34.0 pg   MCHC 32.8 32.0 - 36.0 g/dL   RDW 14.8 (H) 11.5 - 14.5 %   Platelets 240 150 - 440 K/uL    Neutrophils Relative % 59 %   Neutro Abs 4.2 1.4 - 6.5 K/uL   Lymphocytes Relative 30 %   Lymphs Abs 2.1 1.0 - 3.6 K/uL   Monocytes Relative 9 %   Monocytes Absolute 0.6 0.2 - 0.9 K/uL   Eosinophils Relative 1 %   Eosinophils Absolute 0.1 0 - 0.7 K/uL   Basophils Relative 1 %   Basophils Absolute 0.1 0 - 0.1 K/uL  Basic metabolic panel     Status: Abnormal   Collection Time: 04/10/15  2:09 PM  Result Value Ref Range   Sodium 141 135 - 145 mmol/L   Potassium 3.2 (L) 3.5 - 5.1 mmol/L   Chloride 92 (L) 101 - 111 mmol/L   CO2 40 (H) 22 - 32 mmol/L   Glucose, Bld 143 (H) 65 - 99 mg/dL   BUN 17 6 - 20 mg/dL   Creatinine, Ser 1.04 (H) 0.44 - 1.00 mg/dL   Calcium 9.0 8.9 - 10.3 mg/dL   GFR calc non Af Wyvonnia Lora  46 (L) >60 mL/min   GFR calc Af Amer 53 (L) >60 mL/min    Comment: (NOTE) The eGFR has been calculated using the CKD EPI equation. This calculation has not been validated in all clinical situations. eGFR's persistently <60 mL/min signify possible Chronic Kidney Disease.    Anion gap 9 5 - 15  Troponin I     Status: None   Collection Time: 04/10/15  2:09 PM  Result Value Ref Range   Troponin I <0.03 <0.031 ng/mL    Comment:        NO INDICATION OF MYOCARDIAL INJURY.   Brain natriuretic peptide     Status: Abnormal   Collection Time: 04/10/15  2:09 PM  Result Value Ref Range   B Natriuretic Peptide 185.0 (H) 0.0 - 100.0 pg/mL  Urinalysis complete, with microscopic ([facility] only)     Status: Abnormal   Collection Time: 04/10/15  2:28 PM  Result Value Ref Range   Color, Urine STRAW (A) YELLOW   APPearance CLEAR (A) CLEAR   Glucose, UA 50 (A) NEGATIVE mg/dL   Bilirubin Urine NEGATIVE NEGATIVE   Ketones, ur NEGATIVE NEGATIVE mg/dL   Specific Gravity, Urine 1.004 (L) 1.005 - 1.030   Hgb urine dipstick 1+ (A) NEGATIVE   pH 8.0 5.0 - 8.0   Protein, ur NEGATIVE NEGATIVE mg/dL   Nitrite NEGATIVE NEGATIVE   Leukocytes, UA TRACE (A) NEGATIVE   RBC / HPF 0-5 0 - 5 RBC/hpf   WBC, UA  6-30 0 - 5 WBC/hpf   Bacteria, UA NONE SEEN NONE SEEN   Squamous Epithelial / LPF 0-5 (A) NONE SEEN  Basic metabolic panel     Status: Abnormal   Collection Time: 04/11/15  4:08 AM  Result Value Ref Range   Sodium 139 135 - 145 mmol/L   Potassium 3.8 3.5 - 5.1 mmol/L   Chloride 93 (L) 101 - 111 mmol/L   CO2 37 (H) 22 - 32 mmol/L   Glucose, Bld 250 (H) 65 - 99 mg/dL   BUN 25 (H) 6 - 20 mg/dL   Creatinine, Ser 1.60 (H) 0.44 - 1.00 mg/dL   Calcium 8.9 8.9 - 10.3 mg/dL   GFR calc non Af Amer 27 (L) >60 mL/min   GFR calc Af Amer 32 (L) >60 mL/min    Comment: (NOTE) The eGFR has been calculated using the CKD EPI equation. This calculation has not been validated in all clinical situations. eGFR's persistently <60 mL/min signify possible Chronic Kidney Disease.    Anion gap 9 5 - 15  CBC     Status: Abnormal   Collection Time: 04/11/15  4:08 AM  Result Value Ref Range   WBC 7.9 3.6 - 11.0 K/uL   RBC 3.62 (L) 3.80 - 5.20 MIL/uL   Hemoglobin 11.6 (L) 12.0 - 16.0 g/dL   HCT 33.8 (L) 35.0 - 47.0 %   MCV 93.3 80.0 - 100.0 fL   MCH 31.9 26.0 - 34.0 pg   MCHC 34.2 32.0 - 36.0 g/dL   RDW 14.8 (H) 11.5 - 14.5 %   Platelets 218 150 - 440 K/uL  Troponin I     Status: None   Collection Time: 04/12/15 12:16 AM  Result Value Ref Range   Troponin I <0.03 <0.031 ng/mL    Comment:        NO INDICATION OF MYOCARDIAL INJURY.   Basic metabolic panel     Status: Abnormal   Collection Time: 04/12/15  4:08 AM  Result Value  Ref Range   Sodium 136 135 - 145 mmol/L   Potassium 4.5 3.5 - 5.1 mmol/L   Chloride 96 (L) 101 - 111 mmol/L   CO2 37 (H) 22 - 32 mmol/L   Glucose, Bld 176 (H) 65 - 99 mg/dL   BUN 34 (H) 6 - 20 mg/dL   Creatinine, Ser 1.19 (H) 0.44 - 1.00 mg/dL   Calcium 8.6 (L) 8.9 - 10.3 mg/dL   GFR calc non Af Amer 39 (L) >60 mL/min   GFR calc Af Amer 45 (L) >60 mL/min    Comment: (NOTE) The eGFR has been calculated using the CKD EPI equation. This calculation has not been validated in  all clinical situations. eGFR's persistently <60 mL/min signify possible Chronic Kidney Disease.    Anion gap 3 (L) 5 - 15  Basic metabolic panel     Status: Abnormal   Collection Time: 04/13/15  3:53 AM  Result Value Ref Range   Sodium 138 135 - 145 mmol/L   Potassium 4.9 3.5 - 5.1 mmol/L   Chloride 94 (L) 101 - 111 mmol/L   CO2 38 (H) 22 - 32 mmol/L   Glucose, Bld 188 (H) 65 - 99 mg/dL   BUN 36 (H) 6 - 20 mg/dL   Creatinine, Ser 1.08 (H) 0.44 - 1.00 mg/dL   Calcium 9.1 8.9 - 10.3 mg/dL   GFR calc non Af Amer 44 (L) >60 mL/min   GFR calc Af Amer 51 (L) >60 mL/min    Comment: (NOTE) The eGFR has been calculated using the CKD EPI equation. This calculation has not been validated in all clinical situations. eGFR's persistently <60 mL/min signify possible Chronic Kidney Disease.    Anion gap 6 5 - 15  CBC     Status: Abnormal   Collection Time: 04/13/15  3:53 AM  Result Value Ref Range   WBC 8.8 3.6 - 11.0 K/uL   RBC 3.55 (L) 3.80 - 5.20 MIL/uL   Hemoglobin 11.0 (L) 12.0 - 16.0 g/dL   HCT 33.6 (L) 35.0 - 47.0 %   MCV 94.7 80.0 - 100.0 fL   MCH 31.0 26.0 - 34.0 pg   MCHC 32.8 32.0 - 36.0 g/dL   RDW 15.1 (H) 11.5 - 14.5 %   Platelets 232 150 - 440 K/uL  Glucose, capillary     Status: Abnormal   Collection Time: 04/13/15  7:27 AM  Result Value Ref Range   Glucose-Capillary 163 (H) 65 - 99 mg/dL   Comment 1 Notify RN    Comment 2 Document in Chart   Troponin I (q 6hr x 3)     Status: None   Collection Time: 04/13/15  2:25 PM  Result Value Ref Range   Troponin I <0.03 <0.031 ng/mL    Comment:        NO INDICATION OF MYOCARDIAL INJURY.   Troponin I (q 6hr x 3)     Status: None   Collection Time: 04/14/15  1:49 AM  Result Value Ref Range   Troponin I <0.03 <0.031 ng/mL    Comment:        NO INDICATION OF MYOCARDIAL INJURY.   Basic metabolic panel     Status: Abnormal   Collection Time: 04/14/15  4:38 AM  Result Value Ref Range   Sodium 138 135 - 145 mmol/L    Potassium 4.9 3.5 - 5.1 mmol/L   Chloride 97 (L) 101 - 111 mmol/L   CO2 38 (H) 22 - 32 mmol/L  Glucose, Bld 213 (H) 65 - 99 mg/dL   BUN 37 (H) 6 - 20 mg/dL   Creatinine, Ser 1.06 (H) 0.44 - 1.00 mg/dL   Calcium 8.9 8.9 - 10.3 mg/dL   GFR calc non Af Amer 45 (L) >60 mL/min   GFR calc Af Amer 52 (L) >60 mL/min    Comment: (NOTE) The eGFR has been calculated using the CKD EPI equation. This calculation has not been validated in all clinical situations. eGFR's persistently <60 mL/min signify possible Chronic Kidney Disease.    Anion gap 3 (L) 5 - 15  Glucose, capillary     Status: Abnormal   Collection Time: 04/14/15  7:48 AM  Result Value Ref Range   Glucose-Capillary 115 (H) 65 - 99 mg/dL  Glucose, capillary     Status: Abnormal   Collection Time: 04/14/15 11:32 AM  Result Value Ref Range   Glucose-Capillary 273 (H) 65 - 99 mg/dL     Assessment & Plan  Problem List Items Addressed This Visit      Cardiovascular and Mediastinum   Chronic systolic heart failure (HCC)   Essential hypertension - Primary     Respiratory   Acute on chronic respiratory failure with hypoxemia (HCC)   Relevant Medications   predniSONE (DELTASONE) 10 MG tablet     Digestive   GERD (gastroesophageal reflux disease)   Relevant Medications   ranitidine (ZANTAC) 150 MG tablet   Esophageal stricture      Meds ordered this encounter  Medications  . DISCONTD: metroNIDAZOLE (FLAGYL) 500 MG tablet    Sig: TAKE 1 TABLET (500 MG TOTAL) BY MOUTH 3 (THREE) TIMES DAILY.    Refill:  0  . ranitidine (ZANTAC) 150 MG tablet    Sig:   . predniSONE (DELTASONE) 10 MG tablet    Sig: Take 1/2 tablet a day for 1 week as per schedule proivided/    Dispense:  30 tablet    Refill:  0    Label  & dispense according to the schedule below: 60 mg PO x 2 days 50 mg PO x 2 days 40 mg PO x 2 days 30 mg PO x 2 days 20 mg PO x 2 days 10 mg PO x 2 days then stop    1. Essential hypertension  -cont. meds  2.  Chronic systolic heart failure (HCC)  -cont. meds 3. Acute on chronic respiratory failure with hypoxemia (HCC)  - predniSONE (DELTASONE) 10 MG tablet; Take 1/2 tablet a day for 1 week as per schedule proivided/  Dispense: 30 tablet; Refill: 0  4. Esophageal stricture  _ok to see Dr. Allen Norris re EGD. 5. Gastroesophageal reflux disease with esophagitis  -cont. meds 6. Cerumen impaction, bilateral Lavage

## 2015-05-01 NOTE — Addendum Note (Signed)
Addended by: Theresia Majors A on: 05/01/2015 04:45 PM   Modules accepted: Miquel Dunn

## 2015-05-02 ENCOUNTER — Telehealth: Payer: Self-pay

## 2015-05-02 ENCOUNTER — Other Ambulatory Visit: Payer: Self-pay

## 2015-05-02 DIAGNOSIS — I2510 Atherosclerotic heart disease of native coronary artery without angina pectoris: Secondary | ICD-10-CM | POA: Diagnosis not present

## 2015-05-02 DIAGNOSIS — Z7901 Long term (current) use of anticoagulants: Secondary | ICD-10-CM | POA: Diagnosis not present

## 2015-05-02 DIAGNOSIS — E1122 Type 2 diabetes mellitus with diabetic chronic kidney disease: Secondary | ICD-10-CM | POA: Diagnosis not present

## 2015-05-02 DIAGNOSIS — J189 Pneumonia, unspecified organism: Secondary | ICD-10-CM | POA: Diagnosis not present

## 2015-05-02 DIAGNOSIS — J441 Chronic obstructive pulmonary disease with (acute) exacerbation: Secondary | ICD-10-CM | POA: Diagnosis not present

## 2015-05-02 DIAGNOSIS — K219 Gastro-esophageal reflux disease without esophagitis: Secondary | ICD-10-CM | POA: Diagnosis not present

## 2015-05-02 DIAGNOSIS — J440 Chronic obstructive pulmonary disease with acute lower respiratory infection: Secondary | ICD-10-CM | POA: Diagnosis not present

## 2015-05-02 DIAGNOSIS — Z853 Personal history of malignant neoplasm of breast: Secondary | ICD-10-CM | POA: Diagnosis not present

## 2015-05-02 DIAGNOSIS — N183 Chronic kidney disease, stage 3 (moderate): Secondary | ICD-10-CM | POA: Diagnosis not present

## 2015-05-02 DIAGNOSIS — Z9981 Dependence on supplemental oxygen: Secondary | ICD-10-CM | POA: Diagnosis not present

## 2015-05-02 DIAGNOSIS — E1151 Type 2 diabetes mellitus with diabetic peripheral angiopathy without gangrene: Secondary | ICD-10-CM | POA: Diagnosis not present

## 2015-05-02 DIAGNOSIS — I5023 Acute on chronic systolic (congestive) heart failure: Secondary | ICD-10-CM | POA: Diagnosis not present

## 2015-05-02 DIAGNOSIS — I129 Hypertensive chronic kidney disease with stage 1 through stage 4 chronic kidney disease, or unspecified chronic kidney disease: Secondary | ICD-10-CM | POA: Diagnosis not present

## 2015-05-02 NOTE — Telephone Encounter (Signed)
-----   Message from Evette Cristal sent at 05/02/2015 11:07 AM EST ----- Patient has r/s her EGD for 12-13 at [facility] she needs card clearance Dr.Arida Rogue Jury Please call patient and let her know about her medications

## 2015-05-02 NOTE — Telephone Encounter (Signed)
Cardiac and blood thinner clearance has been sent to Dr. Fletcher Anon. Pt will be notified his he doesn't clear her as well as when to stop her Xarelto 15mg .

## 2015-05-03 ENCOUNTER — Telehealth: Payer: Self-pay

## 2015-05-03 ENCOUNTER — Ambulatory Visit: Payer: Medicare Other | Admitting: Family

## 2015-05-03 NOTE — Telephone Encounter (Signed)
Faxed clearance for EGD to [facility], [phone]

## 2015-05-05 DIAGNOSIS — Z853 Personal history of malignant neoplasm of breast: Secondary | ICD-10-CM | POA: Diagnosis not present

## 2015-05-05 DIAGNOSIS — J440 Chronic obstructive pulmonary disease with acute lower respiratory infection: Secondary | ICD-10-CM | POA: Diagnosis not present

## 2015-05-05 DIAGNOSIS — E1122 Type 2 diabetes mellitus with diabetic chronic kidney disease: Secondary | ICD-10-CM | POA: Diagnosis not present

## 2015-05-05 DIAGNOSIS — N183 Chronic kidney disease, stage 3 (moderate): Secondary | ICD-10-CM | POA: Diagnosis not present

## 2015-05-05 DIAGNOSIS — Z9981 Dependence on supplemental oxygen: Secondary | ICD-10-CM | POA: Diagnosis not present

## 2015-05-05 DIAGNOSIS — K219 Gastro-esophageal reflux disease without esophagitis: Secondary | ICD-10-CM | POA: Diagnosis not present

## 2015-05-05 DIAGNOSIS — I5023 Acute on chronic systolic (congestive) heart failure: Secondary | ICD-10-CM | POA: Diagnosis not present

## 2015-05-05 DIAGNOSIS — I2510 Atherosclerotic heart disease of native coronary artery without angina pectoris: Secondary | ICD-10-CM | POA: Diagnosis not present

## 2015-05-05 DIAGNOSIS — J189 Pneumonia, unspecified organism: Secondary | ICD-10-CM | POA: Diagnosis not present

## 2015-05-05 DIAGNOSIS — I129 Hypertensive chronic kidney disease with stage 1 through stage 4 chronic kidney disease, or unspecified chronic kidney disease: Secondary | ICD-10-CM | POA: Diagnosis not present

## 2015-05-05 DIAGNOSIS — E1151 Type 2 diabetes mellitus with diabetic peripheral angiopathy without gangrene: Secondary | ICD-10-CM | POA: Diagnosis not present

## 2015-05-05 DIAGNOSIS — J441 Chronic obstructive pulmonary disease with (acute) exacerbation: Secondary | ICD-10-CM | POA: Diagnosis not present

## 2015-05-05 DIAGNOSIS — Z7901 Long term (current) use of anticoagulants: Secondary | ICD-10-CM | POA: Diagnosis not present

## 2015-05-08 DIAGNOSIS — E1151 Type 2 diabetes mellitus with diabetic peripheral angiopathy without gangrene: Secondary | ICD-10-CM | POA: Diagnosis not present

## 2015-05-08 DIAGNOSIS — Z853 Personal history of malignant neoplasm of breast: Secondary | ICD-10-CM | POA: Diagnosis not present

## 2015-05-08 DIAGNOSIS — J440 Chronic obstructive pulmonary disease with acute lower respiratory infection: Secondary | ICD-10-CM | POA: Diagnosis not present

## 2015-05-08 DIAGNOSIS — N183 Chronic kidney disease, stage 3 (moderate): Secondary | ICD-10-CM | POA: Diagnosis not present

## 2015-05-08 DIAGNOSIS — I2510 Atherosclerotic heart disease of native coronary artery without angina pectoris: Secondary | ICD-10-CM | POA: Diagnosis not present

## 2015-05-08 DIAGNOSIS — J189 Pneumonia, unspecified organism: Secondary | ICD-10-CM | POA: Diagnosis not present

## 2015-05-08 DIAGNOSIS — Z9981 Dependence on supplemental oxygen: Secondary | ICD-10-CM | POA: Diagnosis not present

## 2015-05-08 DIAGNOSIS — J441 Chronic obstructive pulmonary disease with (acute) exacerbation: Secondary | ICD-10-CM | POA: Diagnosis not present

## 2015-05-08 DIAGNOSIS — Z7901 Long term (current) use of anticoagulants: Secondary | ICD-10-CM | POA: Diagnosis not present

## 2015-05-08 DIAGNOSIS — E1122 Type 2 diabetes mellitus with diabetic chronic kidney disease: Secondary | ICD-10-CM | POA: Diagnosis not present

## 2015-05-08 DIAGNOSIS — I129 Hypertensive chronic kidney disease with stage 1 through stage 4 chronic kidney disease, or unspecified chronic kidney disease: Secondary | ICD-10-CM | POA: Diagnosis not present

## 2015-05-08 DIAGNOSIS — I5023 Acute on chronic systolic (congestive) heart failure: Secondary | ICD-10-CM | POA: Diagnosis not present

## 2015-05-08 DIAGNOSIS — K219 Gastro-esophageal reflux disease without esophagitis: Secondary | ICD-10-CM | POA: Diagnosis not present

## 2015-05-09 ENCOUNTER — Other Ambulatory Visit: Payer: Self-pay | Admitting: *Deleted

## 2015-05-09 NOTE — Patient Outreach (Signed)
RNCM attempted to outreach pt as part of the transition of care program. HIPPA compliant voicemail left.  Plan: RNCM will attempt to contact pt again tomorrow.  Rutherford Limerick RN, BSN  [facility] Care Management [phone])

## 2015-05-10 ENCOUNTER — Other Ambulatory Visit: Payer: Self-pay | Admitting: *Deleted

## 2015-05-10 DIAGNOSIS — J189 Pneumonia, unspecified organism: Secondary | ICD-10-CM | POA: Diagnosis not present

## 2015-05-10 DIAGNOSIS — N183 Chronic kidney disease, stage 3 (moderate): Secondary | ICD-10-CM | POA: Diagnosis not present

## 2015-05-10 DIAGNOSIS — I129 Hypertensive chronic kidney disease with stage 1 through stage 4 chronic kidney disease, or unspecified chronic kidney disease: Secondary | ICD-10-CM | POA: Diagnosis not present

## 2015-05-10 DIAGNOSIS — Z9981 Dependence on supplemental oxygen: Secondary | ICD-10-CM | POA: Diagnosis not present

## 2015-05-10 DIAGNOSIS — Z853 Personal history of malignant neoplasm of breast: Secondary | ICD-10-CM | POA: Diagnosis not present

## 2015-05-10 DIAGNOSIS — J440 Chronic obstructive pulmonary disease with acute lower respiratory infection: Secondary | ICD-10-CM | POA: Diagnosis not present

## 2015-05-10 DIAGNOSIS — E1122 Type 2 diabetes mellitus with diabetic chronic kidney disease: Secondary | ICD-10-CM | POA: Diagnosis not present

## 2015-05-10 DIAGNOSIS — E1151 Type 2 diabetes mellitus with diabetic peripheral angiopathy without gangrene: Secondary | ICD-10-CM | POA: Diagnosis not present

## 2015-05-10 DIAGNOSIS — J441 Chronic obstructive pulmonary disease with (acute) exacerbation: Secondary | ICD-10-CM | POA: Diagnosis not present

## 2015-05-10 DIAGNOSIS — I5023 Acute on chronic systolic (congestive) heart failure: Secondary | ICD-10-CM | POA: Diagnosis not present

## 2015-05-10 DIAGNOSIS — K219 Gastro-esophageal reflux disease without esophagitis: Secondary | ICD-10-CM | POA: Diagnosis not present

## 2015-05-10 DIAGNOSIS — Z7901 Long term (current) use of anticoagulants: Secondary | ICD-10-CM | POA: Diagnosis not present

## 2015-05-10 DIAGNOSIS — I2510 Atherosclerotic heart disease of native coronary artery without angina pectoris: Secondary | ICD-10-CM | POA: Diagnosis not present

## 2015-05-10 NOTE — Patient Outreach (Signed)
[facility]) Care Management  05/10/2015  [name] [name] [birth year] [id number]   Phone call to patient to assess for social work needs post discharge from the skilled nursing facility.  HIPPA compliant voicemail message left for a return call.   Sheralyn Boatman [facility] Care Management [phone]

## 2015-05-11 ENCOUNTER — Other Ambulatory Visit: Payer: Self-pay | Admitting: *Deleted

## 2015-05-11 DIAGNOSIS — K219 Gastro-esophageal reflux disease without esophagitis: Secondary | ICD-10-CM | POA: Diagnosis not present

## 2015-05-11 DIAGNOSIS — I129 Hypertensive chronic kidney disease with stage 1 through stage 4 chronic kidney disease, or unspecified chronic kidney disease: Secondary | ICD-10-CM | POA: Diagnosis not present

## 2015-05-11 DIAGNOSIS — I5023 Acute on chronic systolic (congestive) heart failure: Secondary | ICD-10-CM | POA: Diagnosis not present

## 2015-05-11 DIAGNOSIS — Z853 Personal history of malignant neoplasm of breast: Secondary | ICD-10-CM | POA: Diagnosis not present

## 2015-05-11 DIAGNOSIS — J189 Pneumonia, unspecified organism: Secondary | ICD-10-CM | POA: Diagnosis not present

## 2015-05-11 DIAGNOSIS — Z9981 Dependence on supplemental oxygen: Secondary | ICD-10-CM | POA: Diagnosis not present

## 2015-05-11 DIAGNOSIS — I2510 Atherosclerotic heart disease of native coronary artery without angina pectoris: Secondary | ICD-10-CM | POA: Diagnosis not present

## 2015-05-11 DIAGNOSIS — E1122 Type 2 diabetes mellitus with diabetic chronic kidney disease: Secondary | ICD-10-CM | POA: Diagnosis not present

## 2015-05-11 DIAGNOSIS — N183 Chronic kidney disease, stage 3 (moderate): Secondary | ICD-10-CM | POA: Diagnosis not present

## 2015-05-11 DIAGNOSIS — J440 Chronic obstructive pulmonary disease with acute lower respiratory infection: Secondary | ICD-10-CM | POA: Diagnosis not present

## 2015-05-11 DIAGNOSIS — E1151 Type 2 diabetes mellitus with diabetic peripheral angiopathy without gangrene: Secondary | ICD-10-CM | POA: Diagnosis not present

## 2015-05-11 DIAGNOSIS — J441 Chronic obstructive pulmonary disease with (acute) exacerbation: Secondary | ICD-10-CM | POA: Diagnosis not present

## 2015-05-11 DIAGNOSIS — Z7901 Long term (current) use of anticoagulants: Secondary | ICD-10-CM | POA: Diagnosis not present

## 2015-05-11 NOTE — Patient Outreach (Signed)
RNCM contacted pt as part of the transition of care program. Pt stating [facility] and HHPT have been working with her and she feels the best she's felt in a long time. Pt denies s/s of worsening Hf and reports daily weights are wnl. Pt denies having trouble affording her medications and reports she has transportation to the doctor. Pt agreeable for RNCM to come to her home for a home visit.  Plan: RNCM will visit pt in her home Dec 6 @ 10am.  Merlene Morse Kyler Lerette RN, BSN  [facility] Care Management [phone])

## 2015-05-12 DIAGNOSIS — E1151 Type 2 diabetes mellitus with diabetic peripheral angiopathy without gangrene: Secondary | ICD-10-CM | POA: Diagnosis not present

## 2015-05-12 DIAGNOSIS — J440 Chronic obstructive pulmonary disease with acute lower respiratory infection: Secondary | ICD-10-CM | POA: Diagnosis not present

## 2015-05-12 DIAGNOSIS — Z7901 Long term (current) use of anticoagulants: Secondary | ICD-10-CM | POA: Diagnosis not present

## 2015-05-12 DIAGNOSIS — Z9981 Dependence on supplemental oxygen: Secondary | ICD-10-CM | POA: Diagnosis not present

## 2015-05-12 DIAGNOSIS — N183 Chronic kidney disease, stage 3 (moderate): Secondary | ICD-10-CM | POA: Diagnosis not present

## 2015-05-12 DIAGNOSIS — Z853 Personal history of malignant neoplasm of breast: Secondary | ICD-10-CM | POA: Diagnosis not present

## 2015-05-12 DIAGNOSIS — I2510 Atherosclerotic heart disease of native coronary artery without angina pectoris: Secondary | ICD-10-CM | POA: Diagnosis not present

## 2015-05-12 DIAGNOSIS — K219 Gastro-esophageal reflux disease without esophagitis: Secondary | ICD-10-CM | POA: Diagnosis not present

## 2015-05-12 DIAGNOSIS — J441 Chronic obstructive pulmonary disease with (acute) exacerbation: Secondary | ICD-10-CM | POA: Diagnosis not present

## 2015-05-12 DIAGNOSIS — J189 Pneumonia, unspecified organism: Secondary | ICD-10-CM | POA: Diagnosis not present

## 2015-05-12 DIAGNOSIS — E1122 Type 2 diabetes mellitus with diabetic chronic kidney disease: Secondary | ICD-10-CM | POA: Diagnosis not present

## 2015-05-12 DIAGNOSIS — I5023 Acute on chronic systolic (congestive) heart failure: Secondary | ICD-10-CM | POA: Diagnosis not present

## 2015-05-12 DIAGNOSIS — I129 Hypertensive chronic kidney disease with stage 1 through stage 4 chronic kidney disease, or unspecified chronic kidney disease: Secondary | ICD-10-CM | POA: Diagnosis not present

## 2015-05-15 DIAGNOSIS — I5023 Acute on chronic systolic (congestive) heart failure: Secondary | ICD-10-CM | POA: Diagnosis not present

## 2015-05-15 DIAGNOSIS — Z9981 Dependence on supplemental oxygen: Secondary | ICD-10-CM | POA: Diagnosis not present

## 2015-05-15 DIAGNOSIS — Z7901 Long term (current) use of anticoagulants: Secondary | ICD-10-CM | POA: Diagnosis not present

## 2015-05-15 DIAGNOSIS — J189 Pneumonia, unspecified organism: Secondary | ICD-10-CM | POA: Diagnosis not present

## 2015-05-15 DIAGNOSIS — J440 Chronic obstructive pulmonary disease with acute lower respiratory infection: Secondary | ICD-10-CM | POA: Diagnosis not present

## 2015-05-15 DIAGNOSIS — N183 Chronic kidney disease, stage 3 (moderate): Secondary | ICD-10-CM | POA: Diagnosis not present

## 2015-05-15 DIAGNOSIS — E1151 Type 2 diabetes mellitus with diabetic peripheral angiopathy without gangrene: Secondary | ICD-10-CM | POA: Diagnosis not present

## 2015-05-15 DIAGNOSIS — J441 Chronic obstructive pulmonary disease with (acute) exacerbation: Secondary | ICD-10-CM | POA: Diagnosis not present

## 2015-05-15 DIAGNOSIS — I129 Hypertensive chronic kidney disease with stage 1 through stage 4 chronic kidney disease, or unspecified chronic kidney disease: Secondary | ICD-10-CM | POA: Diagnosis not present

## 2015-05-15 DIAGNOSIS — K219 Gastro-esophageal reflux disease without esophagitis: Secondary | ICD-10-CM | POA: Diagnosis not present

## 2015-05-15 DIAGNOSIS — E1122 Type 2 diabetes mellitus with diabetic chronic kidney disease: Secondary | ICD-10-CM | POA: Diagnosis not present

## 2015-05-15 DIAGNOSIS — I2510 Atherosclerotic heart disease of native coronary artery without angina pectoris: Secondary | ICD-10-CM | POA: Diagnosis not present

## 2015-05-15 DIAGNOSIS — Z853 Personal history of malignant neoplasm of breast: Secondary | ICD-10-CM | POA: Diagnosis not present

## 2015-05-16 ENCOUNTER — Telehealth: Payer: Self-pay | Admitting: *Deleted

## 2015-05-16 ENCOUNTER — Other Ambulatory Visit: Payer: Self-pay | Admitting: *Deleted

## 2015-05-16 ENCOUNTER — Encounter: Payer: Self-pay | Admitting: *Deleted

## 2015-05-16 DIAGNOSIS — J449 Chronic obstructive pulmonary disease, unspecified: Secondary | ICD-10-CM | POA: Diagnosis not present

## 2015-05-16 NOTE — Telephone Encounter (Signed)
[name] [name] called and stated she visited with patient today. She says patient was not feeling well, her heart rate was 52-55.She says patient says her belly felt full. She has some nausea and decreased appetite. Nurse says patient would also benefit from a portable oxygen tank. Please advise as to if she needs to be sooner than 12/21 scheduled appt.

## 2015-05-16 NOTE — Patient Outreach (Signed)
[name] [name] [facility]) Care Management   05/16/2015  CATON WOHLWEND January 20, 1923 [id number]  [name] [name] is an 81 y.o. female  Subjective: "I would like more information on heart failure and atrial fib." "I have this scale from my insurance but I have not been able to remember to use it." "My stomach feels bloated and I don't have much appetite at times i even feel nauseated." "I was so short of breath yesterday the therapist called for me to have my nebulizer 4 times a day." "I really need a lighter portable 02 tank the one I have is so hard to carry."  Objective:  Blood pressure 136/68, pulse 55, resp. rate 18, weight 160 lb (72.576 kg), SpO2 92 %. on 02 at 3lpm. ( heart rate ranged from 52-55 bpm) Review of Systems  Respiratory: Positive for shortness of breath.   Gastrointestinal: Positive for nausea and constipation.       Pt reports low appetite. And occasional nausea recently.   Musculoskeletal: Negative for falls.    Physical Exam  Constitutional: She is oriented to person, place, and time. She appears well-developed and well-nourished.  Neck: Normal range of motion.  Cardiovascular: An irregular rhythm present. Bradycardia present.   Pulses:      Radial pulses are 2+ on the right side, and 2+ on the left side.       Dorsalis pedis pulses are 1+ on the right side, and 2+ on the left side.  Respiratory: Effort normal and breath sounds normal.  GI: Soft. Bowel sounds are normal. There is tenderness in the left lower quadrant.  Pt abdomen noted to be soft but full. Pt not a good historian about weights so unable to determine if fullness was fluid. Pt stated her abd felt "bloated"  Musculoskeletal: Normal range of motion.  Neurological: She is alert and oriented to person, place, and time.  Skin: Skin is warm and dry.  Lower legs brown in color from hx of poor circulation. No redness or warmth noted.  Psychiatric: She has a normal mood and affect. Her behavior is normal.  Judgment and thought content normal.    Current Medications:   Current Outpatient Prescriptions  Medication Sig Dispense Refill  . albuterol (PROVENTIL HFA;VENTOLIN HFA) 108 (90 BASE) MCG/ACT inhaler Inhale 2 puffs into the lungs every 4 (four) hours as needed for wheezing or shortness of breath.    [name] albuterol (PROVENTIL) (2.5 MG/3ML) 0.083% nebulizer solution Take 2.5 mg by nebulization 4 (four) times daily as needed for wheezing or shortness of breath.    . carvedilol (COREG) 6.25 MG tablet Take 6.25 mg by mouth 2 (two) times daily with a meal.    . Fluticasone-Salmeterol (ADVAIR) 250-50 MCG/DOSE AEPB Inhale 1 puff into the lungs 2 (two) times daily.    . furosemide (LASIX) 40 MG tablet Take 40 mg by mouth 2 (two) times daily.     [name] loratadine (CLARITIN) 10 MG tablet Take 10 mg by mouth daily.    . Multiple Vitamin (MULTIVITAMIN WITH MINERALS) TABS tablet Take 1 tablet by mouth daily.    [name] omeprazole (PRILOSEC) 20 MG capsule Take 1 capsule (20 mg total) by mouth daily. 30 capsule 11  . predniSONE (DELTASONE) 10 MG tablet Take 1/2 tablet a day for 1 week as per schedule proivided/ 30 tablet 0  . ranitidine (ZANTAC) 150 MG tablet     . Rivaroxaban (XARELTO) 15 MG TABS tablet Take 15 mg by mouth at bedtime.    [name]  tiotropium (SPIRIVA) 18 MCG inhalation capsule Place 18 mcg into inhaler and inhale daily.    . vitamin B-12 (CYANOCOBALAMIN) 1000 MCG tablet Take 1,000 mcg by mouth daily.    [name] pyridOXINE (VITAMIN B-6) 100 MG tablet Take 100 mg by mouth daily.     No current facility-administered medications for this visit.    Functional Status:   In your present state of health, do you have any difficulty performing the following activities: 05/16/2015 05/01/2015  Hearing? Y N  Vision? Y N  Difficulty concentrating or making decisions? N N  Walking or climbing stairs? N N  Dressing or bathing? N N  Doing errands, shopping? Y N  Preparing Food and eating ? N -  Using the Toilet? N -  In the  past six months, have you accidently leaked urine? Y -  Do you have problems with loss of bowel control? N -  Managing your Medications? N -  Managing your Finances? Y -  Housekeeping or managing your Housekeeping? Y -    Fall/Depression Screening:    PHQ 2/9 Scores 05/16/2015 04/24/2015 03/15/2015 12/28/2014 12/23/2014  PHQ - 2 Score 0 0 0 0 0   Fall Risk  05/16/2015 04/24/2015 03/15/2015 12/28/2014 12/23/2014  Falls in the past year? No No No No No  Risk for fall due to : Medication side effect Impaired vision - - -    Assessment: Alert and oriented pleasant elderly pt. Home noted to be in fair repair with several uneven floor surfaces. Pt with a small dog in the home. Fall precautions discussed with pt. Pt with a good understanding of medication purposes, doses and times. Pt with all ordered medications in the home. Pt verbalized a good support system with her daughter in law, a neighbor and her church family. Pt ambulating well. Stated she has a cane but only uses it if going out for awhile.   COPD: Lungs clear. Using 02 at 3lpm. Pt denies increased cough or sputum production. Pt did report SOB in which her HHnurse called the MD and got an order for her to increase nebulizer to 4x per day. Pt using balloons to strengthen her lungs per her MD's instructions. Pt stating her O2 equipment has not been serviced in "awhile" O2 tubing noted to be yellowed form age. RNCM encouraged pt to call for more tubing and pt stated she owed them money and was unable to get any more before paying the debt. Pt also stating it was hard for her to use her current portable O2 tanks related to they are heavy. RNCM made MD office aware of pt's need for a portable concentrator.  HF: Pt c/o feeling tired, having a "full" feeling to her abdomen. Heart rate 52-55 bpm. Pt reports SOB with exertion. Denies daily weights related to forgetting. Pt also stating this week had a decreased appetite with some nausea. Abd was slightly  distended and pt c/o tenderness to LLQ to palpation. Pt denies orthopnea, claudication, or chest pain. RNCM placed a call to primary care MD office to report pt's symptoms and bradycardia after reviewing pt chart for her baseline heart rate.(normally in 64's) RNCM spoke with Davy Pique at Dr. Luan Pulling office. Assisted pt with tele-monitoring system with scale provided by the pt ins company to assist pt with Hf support. Pt able to use the system with minimal standby assist. RNCM did pt education on low sodium diet and looking at sodium amounts on labels.  Plan: RNCM will f/u with pt next  week as part of the transition of care program.  RNCM will collaborate with pt's insurance company and O2 supply company to attempt in assisting pt with adequate O2 supplies.    [facility] CM Care Plan Problem Three        Most Recent Value   Care Plan Problem Three  Pt with discharge from SNF high risk to readmit to acute care setting   Role Documenting the Problem Three  Care Management [place] for Problem Three  Active   THN Long Term Goal (31-90) days  Pt will not readmit to hospital in the next 90 days   THN Long Term Goal Start Date  05/01/15   Interventions for Problem Three Long Term Goal  Transition of care program initialted by [facility] post SNF discharge.    THN CM Short Term Goal #1 (0-30 days)  Pt will start using telemonitor system to monitor weights for the next 30 days   THN CM Short Term Goal #1 Start Date  05/16/15   Interventions for Short Term Goal #1  RNCM assisted pt in seting up monitor. RNCM educated pt on using the monitor and watched as pt went through the steps on the tablet. RNCM  educated pt on the s/s of HF and gave pt  HF packet.   THN CM Short Term Goal #2 (0-30 days)  Pt will be familiar with HF zones and be able to verbalize 3 s/s of HF in the yellow zone in the next 30 days.   THN CM Short Term Goal #2 Start Date  05/16/15   Interventions for Short Term Goal #2  HF packet given to  pt, and went over with pt by [facility]. RNCM placed HF magnet on pt refridgerator      Arnetia Bronk RN, BSN  [facility] Care Management [phone])

## 2015-05-16 NOTE — Telephone Encounter (Signed)
If she cont. To feel bad, she needs to be seen before the 21st.  If she drasctically gets worse, she should go to ER.    I can't recommend on the phone about what to do.-jh

## 2015-05-17 DIAGNOSIS — Z9981 Dependence on supplemental oxygen: Secondary | ICD-10-CM | POA: Diagnosis not present

## 2015-05-17 DIAGNOSIS — E1122 Type 2 diabetes mellitus with diabetic chronic kidney disease: Secondary | ICD-10-CM | POA: Diagnosis not present

## 2015-05-17 DIAGNOSIS — Z7901 Long term (current) use of anticoagulants: Secondary | ICD-10-CM | POA: Diagnosis not present

## 2015-05-17 DIAGNOSIS — I5023 Acute on chronic systolic (congestive) heart failure: Secondary | ICD-10-CM | POA: Diagnosis not present

## 2015-05-17 DIAGNOSIS — N183 Chronic kidney disease, stage 3 (moderate): Secondary | ICD-10-CM | POA: Diagnosis not present

## 2015-05-17 DIAGNOSIS — J441 Chronic obstructive pulmonary disease with (acute) exacerbation: Secondary | ICD-10-CM | POA: Diagnosis not present

## 2015-05-17 DIAGNOSIS — E1151 Type 2 diabetes mellitus with diabetic peripheral angiopathy without gangrene: Secondary | ICD-10-CM | POA: Diagnosis not present

## 2015-05-17 DIAGNOSIS — K219 Gastro-esophageal reflux disease without esophagitis: Secondary | ICD-10-CM | POA: Diagnosis not present

## 2015-05-17 DIAGNOSIS — J440 Chronic obstructive pulmonary disease with acute lower respiratory infection: Secondary | ICD-10-CM | POA: Diagnosis not present

## 2015-05-17 DIAGNOSIS — I2510 Atherosclerotic heart disease of native coronary artery without angina pectoris: Secondary | ICD-10-CM | POA: Diagnosis not present

## 2015-05-17 DIAGNOSIS — J189 Pneumonia, unspecified organism: Secondary | ICD-10-CM | POA: Diagnosis not present

## 2015-05-17 DIAGNOSIS — I129 Hypertensive chronic kidney disease with stage 1 through stage 4 chronic kidney disease, or unspecified chronic kidney disease: Secondary | ICD-10-CM | POA: Diagnosis not present

## 2015-05-17 DIAGNOSIS — Z853 Personal history of malignant neoplasm of breast: Secondary | ICD-10-CM | POA: Diagnosis not present

## 2015-05-17 NOTE — Telephone Encounter (Signed)
Called patient and she states she is feeling much better today. She says still get winded at times, her belly is feeling much better today. Advised patient of pending appointment on 12/21 and that she can call us for earlier appt if necessary.

## 2015-05-21 DIAGNOSIS — J449 Chronic obstructive pulmonary disease, unspecified: Secondary | ICD-10-CM | POA: Diagnosis not present

## 2015-05-23 ENCOUNTER — Ambulatory Visit: Payer: Medicare Other | Admitting: Anesthesiology

## 2015-05-23 ENCOUNTER — Encounter: Payer: Self-pay | Admitting: Anesthesiology

## 2015-05-23 ENCOUNTER — Ambulatory Visit
Admission: RE | Admit: 2015-05-23 | Discharge: 2015-05-23 | Disposition: A | Discharge status: Home / Self Care | Payer: Medicare Other | Source: Ambulatory Visit | Attending: Gastroenterology | Admitting: Gastroenterology

## 2015-05-23 ENCOUNTER — Encounter
Admission: RE | Disposition: A | Discharge status: Home / Self Care | Payer: Self-pay | Source: Ambulatory Visit | Attending: Gastroenterology

## 2015-05-23 DIAGNOSIS — Z9981 Dependence on supplemental oxygen: Secondary | ICD-10-CM | POA: Insufficient documentation

## 2015-05-23 DIAGNOSIS — Z853 Personal history of malignant neoplasm of breast: Secondary | ICD-10-CM | POA: Insufficient documentation

## 2015-05-23 DIAGNOSIS — I739 Peripheral vascular disease, unspecified: Secondary | ICD-10-CM | POA: Insufficient documentation

## 2015-05-23 DIAGNOSIS — Z888 Allergy status to other drugs, medicaments and biological substances status: Secondary | ICD-10-CM | POA: Diagnosis not present

## 2015-05-23 DIAGNOSIS — Z9849 Cataract extraction status, unspecified eye: Secondary | ICD-10-CM | POA: Insufficient documentation

## 2015-05-23 DIAGNOSIS — N183 Chronic kidney disease, stage 3 (moderate): Secondary | ICD-10-CM | POA: Insufficient documentation

## 2015-05-23 DIAGNOSIS — I252 Old myocardial infarction: Secondary | ICD-10-CM | POA: Insufficient documentation

## 2015-05-23 DIAGNOSIS — Z882 Allergy status to sulfonamides status: Secondary | ICD-10-CM | POA: Insufficient documentation

## 2015-05-23 DIAGNOSIS — Z8249 Family history of ischemic heart disease and other diseases of the circulatory system: Secondary | ICD-10-CM | POA: Insufficient documentation

## 2015-05-23 DIAGNOSIS — I4891 Unspecified atrial fibrillation: Secondary | ICD-10-CM | POA: Insufficient documentation

## 2015-05-23 DIAGNOSIS — Z7901 Long term (current) use of anticoagulants: Secondary | ICD-10-CM | POA: Insufficient documentation

## 2015-05-23 DIAGNOSIS — I130 Hypertensive heart and chronic kidney disease with heart failure and stage 1 through stage 4 chronic kidney disease, or unspecified chronic kidney disease: Secondary | ICD-10-CM | POA: Insufficient documentation

## 2015-05-23 DIAGNOSIS — K219 Gastro-esophageal reflux disease without esophagitis: Secondary | ICD-10-CM | POA: Diagnosis not present

## 2015-05-23 DIAGNOSIS — Z87891 Personal history of nicotine dependence: Secondary | ICD-10-CM | POA: Diagnosis not present

## 2015-05-23 DIAGNOSIS — Z825 Family history of asthma and other chronic lower respiratory diseases: Secondary | ICD-10-CM | POA: Diagnosis not present

## 2015-05-23 DIAGNOSIS — R1310 Dysphagia, unspecified: Secondary | ICD-10-CM | POA: Insufficient documentation

## 2015-05-23 DIAGNOSIS — Z880 Allergy status to penicillin: Secondary | ICD-10-CM | POA: Diagnosis not present

## 2015-05-23 DIAGNOSIS — K449 Diaphragmatic hernia without obstruction or gangrene: Secondary | ICD-10-CM | POA: Insufficient documentation

## 2015-05-23 DIAGNOSIS — K222 Esophageal obstruction: Secondary | ICD-10-CM | POA: Diagnosis not present

## 2015-05-23 DIAGNOSIS — Z7951 Long term (current) use of inhaled steroids: Secondary | ICD-10-CM | POA: Diagnosis not present

## 2015-05-23 DIAGNOSIS — Z79899 Other long term (current) drug therapy: Secondary | ICD-10-CM | POA: Insufficient documentation

## 2015-05-23 DIAGNOSIS — J449 Chronic obstructive pulmonary disease, unspecified: Secondary | ICD-10-CM | POA: Insufficient documentation

## 2015-05-23 DIAGNOSIS — I2510 Atherosclerotic heart disease of native coronary artery without angina pectoris: Secondary | ICD-10-CM | POA: Insufficient documentation

## 2015-05-23 DIAGNOSIS — I5022 Chronic systolic (congestive) heart failure: Secondary | ICD-10-CM | POA: Insufficient documentation

## 2015-05-23 DIAGNOSIS — I5181 Takotsubo syndrome: Secondary | ICD-10-CM | POA: Diagnosis not present

## 2015-05-23 DIAGNOSIS — J90 Pleural effusion, not elsewhere classified: Secondary | ICD-10-CM | POA: Diagnosis not present

## 2015-05-23 HISTORY — PX: ESOPHAGOGASTRODUODENOSCOPY (EGD) WITH PROPOFOL: SHX5813

## 2015-05-23 SURGERY — ESOPHAGOGASTRODUODENOSCOPY (EGD) WITH PROPOFOL
Anesthesia: General

## 2015-05-23 MED ORDER — GLYCOPYRROLATE 0.2 MG/ML IJ SOLN
INTRAMUSCULAR | Status: DC | PRN
  Administered 2015-05-23: 0.2 mg via INTRAVENOUS

## 2015-05-23 MED ORDER — ONDANSETRON HCL 4 MG/2ML IJ SOLN
INTRAMUSCULAR | Status: DC | PRN
Start: 2015-05-23 — End: 2015-05-23
  Administered 2015-05-23: 4 mg via INTRAVENOUS

## 2015-05-23 MED ORDER — PROPOFOL 500 MG/50ML IV EMUL
INTRAVENOUS | Status: DC | PRN
  Administered 2015-05-23: 100 ug/kg/min via INTRAVENOUS

## 2015-05-23 MED ORDER — PROPOFOL 10 MG/ML IV BOLUS
INTRAVENOUS | Status: DC | PRN
  Administered 2015-05-23 (×2): 20 mg via INTRAVENOUS
  Administered 2015-05-23: 10 mg via INTRAVENOUS

## 2015-05-23 MED ORDER — SODIUM CHLORIDE 0.9 % IV SOLN
INTRAVENOUS | Status: DC
  Administered 2015-05-23: 09:00:00 via INTRAVENOUS

## 2015-05-23 NOTE — Op Note (Signed)
[facility] Gastroenterology Patient Name: [name] [name] Procedure Date: 05/23/2015 10:15 AM MRN: [id number] Account #: [id number] Date of Birth: [birth year] Admit Type: Outpatient Age: 81 Room: [facility] ENDO ROOM 4 Gender: Female Note Status: Finalized Procedure:         Upper GI endoscopy Indications:       Dysphagia Providers:         Lucilla Lame, MD Referring MD:      Arlis Porta, MD (Referring MD) Medicines:         Propofol per Anesthesia Complications:     No immediate complications. Procedure:         Pre-Anesthesia Assessment:                    - Prior to the procedure, a History and Physical was                     performed, and patient medications and allergies were                     reviewed. The patient's tolerance of previous anesthesia                     was also reviewed. The risks and benefits of the procedure                     and the sedation options and risks were discussed with the                     patient. All questions were answered, and informed consent                     was obtained. Prior Anticoagulants: The patient has taken                     no previous anticoagulant or antiplatelet agents. ASA                     Grade Assessment: II - A patient with mild systemic                     disease. After reviewing the risks and benefits, the                     patient was deemed in satisfactory condition to undergo                     the procedure.                    After obtaining informed consent, the endoscope was passed                     under direct vision. Throughout the procedure, the                     patient's blood pressure, pulse, and oxygen saturations                     were monitored continuously. The Endoscope was introduced                     through the mouth, and advanced to the second part of  duodenum. The upper GI endoscopy was accomplished without   difficulty. The patient tolerated the procedure well. Findings:      A medium-sized hiatus hernia was present.      One mild benign-appearing, intrinsic stenosis was found at the       gastroesophageal junction. And was traversed. A TTS dilator was passed       through the scope. Dilation with a 15-16.5-18 mm balloon (to a maximum       balloon size of 18 mm) dilator was performed.      The stomach was normal.      The examined duodenum was normal. Impression:        - Medium-sized hiatus hernia.                    - Benign-appearing esophageal stenosis. Dilated.                    - Normal stomach.                    - Normal examined duodenum.                    - No specimens collected. Recommendation:    - Return to GI office PRN. Procedure Code(s): --- Professional ---                    [phone], Esophagogastroduodenoscopy, flexible, transoral;                     with transendoscopic balloon dilation of esophagus (less                     than 30 mm diameter) Diagnosis Code(s): --- Professional ---                    R13.10, Dysphagia, unspecified                    K44.9, Diaphragmatic hernia without obstruction or gangrene                    K22.2, Esophageal obstruction CPT copyright 2014 American Medical Association. All rights reserved. The codes documented in this report are preliminary and upon coder review may  be revised to meet current compliance requirements. Lucilla Lame, MD 05/23/2015 10:30:09 AM This report has been signed electronically. Number of Addenda: 0 Note Initiated On: 05/23/2015 10:15 AM      [facility]

## 2015-05-23 NOTE — Transfer of Care (Signed)
Immediate Anesthesia Transfer of Care Note  Patient: [name] [name]  Procedure(s) Performed: Procedure(s): ESOPHAGOGASTRODUODENOSCOPY (EGD) WITH PROPOFOL (N/A)  Patient Location: PACU  Anesthesia Type:MAC  Level of Consciousness: sedated  Airway & Oxygen Therapy: Patient Spontanous Breathing  Post-op Assessment: Report given to RN  Post vital signs: stable  Last Vitals:  Filed Vitals:   05/23/15 0857 05/23/15 1034  BP: 135/61 130/58  Pulse: 81 82  Temp: 35.8 C 36 C  Resp:  29    Complications: No apparent anesthesia complications

## 2015-05-23 NOTE — Anesthesia Postprocedure Evaluation (Signed)
Anesthesia Post Note  Patient: [name] [name]  Procedure(s) Performed: Procedure(s) (LRB): ESOPHAGOGASTRODUODENOSCOPY (EGD) WITH PROPOFOL (N/A)  Patient location during evaluation: Endoscopy Anesthesia Type: General Level of consciousness: awake Pain management: pain level controlled Vital Signs Assessment: post-procedure vital signs reviewed and stable Respiratory status: spontaneous breathing Cardiovascular status: blood pressure returned to baseline    Last Vitals:  Filed Vitals:   05/23/15 1055 05/23/15 1107  BP: 87/69 136/58  Pulse: 83 74  Temp:    Resp: 20 20    Last Pain: There were no vitals filed for this visit.               Joslynne Klatt S

## 2015-05-23 NOTE — H&P (Signed)
[facility]  [address], [place], [place] [address] Phone: [phone] Fax : [phone]  Primary Care Physician:  [name], MD Primary Gastroenterologist:  Dr. Allen Norris  Pre-Procedure History & Physical: HPI:  [name] [name] is a 81 y.o. female is here for an endoscopy.   Past Medical History  Diagnosis Date  . HTN (hypertension)   . COPD (chronic obstructive pulmonary disease) (HCC)     a. on home O2 nightly.  . Diverticulosis   . Takotsubo cardiomyopathy 10/2012    a. 10/2012: presented w/ cardiogenic shock/systolic CHF/VDRF - EF [id number] by cath, 40% by f/u echo - cath with mild nonobstructive CAD. b. Not discharged on ACEI due to hypotension.  . Cardiogenic shock ([place])     a. 10/2012 - due to Takotsubo CM.  [name] Respiratory failure ([place])     a. On home O2 nightly. b. VDRF 10/2012 - due to CAP/pulm edema in setting of cardiogenic shock/Takotsubo CM.  [name] CAP (community acquired pneumonia)     a. 10/2012 - will need f/u CXR in June 2014 to ensure clearance.  . Esophageal stricture     a. s/p dilitation 10/2012.  [name] Pleural effusion, right     a. s/p thoracentesis 10/28/12.  . Atrial fibrillation ([place])     a. Dx 10/2012, rate controlled, started on Xarelto.  . Claudication ([place])     a. ABI 10/2012 - R 0.87, L 0.74.  [name] GERD (gastroesophageal reflux disease)   . CKD (chronic kidney disease), stage III   . Breast CA (HCC)     hx  . Abdominal hernia     chronic  . STEMI (ST elevation myocardial infarction) ([place])   . Cellulitis   . CHF (congestive heart failure) ([place])   . CAD (coronary artery disease)     a. 10/2012 - presented w/ STEMI felt due to Takotsubo - mild nonobstructive dz by cath.    Past Surgical History  Procedure Laterality Date  . Mastectomy    . Cataract extraction    . Esophagogastroduodenoscopy N/A 11/05/2012    Procedure: ESOPHAGOGASTRODUODENOSCOPY (EGD) with esophageal savary dilatation;  Surgeon: Missy Sabins, MD;  Location: [facility];   Service: Endoscopy;  Laterality: N/A;  savary dil...no floro needed  . Bladder surgery    . Left heart catheterization with coronary angiogram N/A 10/24/2012    Procedure: LEFT HEART CATHETERIZATION WITH CORONARY ANGIOGRAM;  Surgeon: Sherren Mocha, MD;  Location[facility] CATH LAB;  Service: Cardiovascular;  Laterality: N/A;  . Peripheral vascular catheterization N/A 02/07/2015    Procedure: Abdominal Aortogram w/Lower Extremity;  Surgeon: Katha Cabal, MD;  Location: [place] CV LAB;  Service: Cardiovascular;  Laterality: N/A;  . Peripheral vascular catheterization  02/07/2015    Procedure: Lower Extremity Intervention;  Surgeon: Katha Cabal, MD;  Location: [place] CV LAB;  Service: Cardiovascular;;  . Coronary angioplasty      Prior to Admission medications   Medication Sig Start Date End Date Taking? Authorizing Provider  albuterol (PROVENTIL HFA;VENTOLIN HFA) 108 (90 BASE) MCG/ACT inhaler Inhale 2 puffs into the lungs every 4 (four) hours as needed for wheezing or shortness of breath.   Yes Historical Provider, MD  carvedilol (COREG) 6.25 MG tablet Take 6.25 mg by mouth 2 (two) times daily with a meal.   Yes Historical Provider, MD  Fluticasone-Salmeterol (ADVAIR) 250-50 MCG/DOSE AEPB Inhale 1 puff into the lungs 2 (two) times daily.   Yes Historical Provider, MD  tiotropium (SPIRIVA) 18  MCG inhalation capsule Place 18 mcg into inhaler and inhale daily.   Yes Historical Provider, MD  albuterol (PROVENTIL) (2.5 MG/3ML) 0.083% nebulizer solution Take 2.5 mg by nebulization 4 (four) times daily as needed for wheezing or shortness of breath.    Historical Provider, MD  furosemide (LASIX) 40 MG tablet Take 40 mg by mouth 2 (two) times daily.     Historical Provider, MD  loratadine (CLARITIN) 10 MG tablet Take 10 mg by mouth daily.    Historical Provider, MD  Multiple Vitamin (MULTIVITAMIN WITH MINERALS) TABS tablet Take 1 tablet by mouth daily.    Historical Provider, MD  omeprazole  (PRILOSEC) 20 MG capsule Take 1 capsule (20 mg total) by mouth daily. 12/21/14   Arlis Porta., MD  predniSONE (DELTASONE) 10 MG tablet Take 1/2 tablet a day for 1 week as per schedule proivided/ 05/01/15   Arlis Porta., MD  pyridOXINE (VITAMIN B-6) 100 MG tablet Take 100 mg by mouth daily.    Historical Provider, MD  ranitidine (ZANTAC) 150 MG tablet  04/27/15   Historical Provider, MD  Rivaroxaban (XARELTO) 15 MG TABS tablet Take 15 mg by mouth at bedtime.    Historical Provider, MD  vitamin B-12 (CYANOCOBALAMIN) 1000 MCG tablet Take 1,000 mcg by mouth daily.    Historical Provider, MD    Allergies as of 05/02/2015 - Review Complete 05/01/2015  Allergen Reaction Noted  . Penicillins Itching, Swelling, Rash, and Other (See Comments) 11/05/2012  . Levaquin [levofloxacin in d5w]  04/10/2015  . Sulfa antibiotics Other (See Comments) 10/24/2012    Family History  Problem Relation Age of Onset  . Heart attack Mother   . COPD Son     Social History   Social History  . Marital Status: Widowed    Spouse Name: N/A  . Number of Children: 1  . Years of Education: N/A   Occupational History  . Not on file.   Social History Main Topics  . Smoking status: Former Smoker -- 1.00 packs/day for 55 years    Types: Cigarettes  . Smokeless tobacco: Former Systems developer  . Alcohol Use: No  . Drug Use: No  . Sexual Activity: No   Other Topics Concern  . Not on file   Social History Narrative    Review of Systems: See HPI, otherwise negative ROS  Physical Exam: BP 135/61 mmHg  Pulse 81  Temp(Src) 96.4 F (35.8 C) (Tympanic)  SpO2 92% General:   Alert,  pleasant and cooperative in NAD Head:  Normocephalic and atraumatic. Neck:  Supple; no masses or thyromegaly. Lungs:  Clear throughout to auscultation.    Heart:  Regular rate and rhythm. Abdomen:  Soft, nontender and nondistended. Normal bowel sounds, without guarding, and without rebound.   Neurologic:  Alert and  oriented  x4;  grossly normal neurologically.  Impression/Plan: [name] [name] is here for an endoscopy to be performed for dysphagia  Risks, benefits, limitations, and alternatives regarding  endoscopy have been reviewed with the patient.  Questions have been answered.  All parties agreeable.   Ollen Bowl, MD  05/23/2015, 9:40 AM

## 2015-05-23 NOTE — Anesthesia Preprocedure Evaluation (Signed)
Anesthesia Evaluation  Patient identified by MRN, date of birth, ID band Patient awake    Reviewed: Allergy & Precautions, NPO status , Patient's Chart, lab work & pertinent test results, reviewed documented beta blocker date and time   Airway Mallampati: II  TM Distance: >3 FB     Dental  (+) Chipped   Pulmonary pneumonia, resolved, COPD, former smoker,           Cardiovascular hypertension, Pt. on medications + CAD, + Past MI, + Peripheral Vascular Disease and +CHF       Neuro/Psych    GI/Hepatic GERD  Controlled,  Endo/Other    Renal/GU Renal InsufficiencyRenal disease     Musculoskeletal   Abdominal   Peds  Hematology   Anesthesia Other Findings   Reproductive/Obstetrics                             Anesthesia Physical Anesthesia Plan  ASA: III  Anesthesia Plan: General   Post-op Pain Management:    Induction: Intravenous  Airway Management Planned:   Additional Equipment:   Intra-op Plan:   Post-operative Plan:   Informed Consent: I have reviewed the patients History and Physical, chart, labs and discussed the procedure including the risks, benefits and alternatives for the proposed anesthesia with the patient or authorized representative who has indicated his/her understanding and acceptance.     Plan Discussed with: CRNA  Anesthesia Plan Comments:         Anesthesia Quick Evaluation

## 2015-05-24 ENCOUNTER — Other Ambulatory Visit: Payer: Self-pay | Admitting: *Deleted

## 2015-05-24 ENCOUNTER — Encounter: Payer: Self-pay | Admitting: Gastroenterology

## 2015-05-24 NOTE — Patient Outreach (Signed)
RNCM called pt as part of the transition of care program. Pt had a procedure yesterday to stretch esophagus. Pt stated she had vomited once last night causing her throat to feel like it was on fire. She stated she had felt really tired and wiped out today but her throat was feeling better than last night. Pt stated she had been using warm salt water gargles and tylenol for discomfort. Pt denies SOB, blood in emesis, or further vomiting. Pt stated she had been weighing daily and her weight was 159 yesterday but did not feel like weighing this am. Pt stated she had a family member who was bringing her a meal tonight. RNCM and pt discussed bland soft foods that would be easy on her throat. Pt stating she was using 02 per normal. RNCM advised pt to call MD tomorrow for any further vomiting or s/s of HF. Pt verbalized understanding.    Plan: RNCM will call pt next week as continued part of the transition of care program.  Rutherford Limerick RN, BSN  [facility] Care Management [phone]

## 2015-05-25 ENCOUNTER — Telehealth: Payer: Self-pay | Admitting: Family Medicine

## 2015-05-29 ENCOUNTER — Ambulatory Visit (INDEPENDENT_AMBULATORY_CARE_PROVIDER_SITE_OTHER): Payer: Medicare Other | Admitting: Family Medicine

## 2015-05-29 ENCOUNTER — Encounter: Payer: Self-pay | Admitting: Family Medicine

## 2015-05-29 VITALS — BP 118/62 | HR 87 | Temp 98.7°F | Resp 16 | Ht 66.0 in | Wt 162.4 lb

## 2015-05-29 DIAGNOSIS — J40 Bronchitis, not specified as acute or chronic: Secondary | ICD-10-CM | POA: Diagnosis not present

## 2015-05-29 DIAGNOSIS — J069 Acute upper respiratory infection, unspecified: Secondary | ICD-10-CM | POA: Diagnosis not present

## 2015-05-29 MED ORDER — LORATADINE 10 MG PO TABS: 10.0000 mg | ORAL_TABLET | Freq: Every day | ORAL | Status: DC

## 2015-05-29 MED ORDER — DM-GUAIFENESIN ER 30-600 MG PO TB12: 1.0000 | ORAL_TABLET | Freq: Two times a day (BID) | ORAL | Status: DC

## 2015-05-29 MED ORDER — AZITHROMYCIN 250 MG PO TABS: ORAL_TABLET | ORAL | Status: DC

## 2015-05-29 NOTE — Progress Notes (Signed)
Name: [name] [name]   MRN: [id number]    DOB: [birth year]   Date:05/29/2015       Progress Note  Subjective  Chief Complaint  Chief Complaint  Patient presents with  . Cough    1 week    HPI Here c/o cough and sinus congestion for past week.  Cough productive of yellow sputum.  Also c/o burning facial congestion.  No fever.  SOB until she coughed producing thick yellow sputum.  Now feels better.   Pet dog scratched her L hand before coming in today.  Minor flap laceration to dorsum of L hand.  No problem-specific assessment & plan notes found for this encounter.   Past Medical History  Diagnosis Date  . HTN (hypertension)   . COPD (chronic obstructive pulmonary disease) (HCC)     a. on home O2 nightly.  . Diverticulosis   . Takotsubo cardiomyopathy 10/2012    a. 10/2012: presented w/ cardiogenic shock/systolic CHF/VDRF - EF 10% by cath, 40% by f/u echo - cath with mild nonobstructive CAD. b. Not discharged on ACEI due to hypotension.  . Cardiogenic shock ([place])     a. 10/2012 - due to Takotsubo CM.  [name] Respiratory failure ([place])     a. On home O2 nightly. b. VDRF 10/2012 - due to CAP/pulm edema in setting of cardiogenic shock/Takotsubo CM.  [name] CAP (community acquired pneumonia)     a. 10/2012 - will need f/u CXR in June 2014 to ensure clearance.  . Esophageal stricture     a. s/p dilitation 10/2012.  [name] Pleural effusion, right     a. s/p thoracentesis 10/28/12.  . Atrial fibrillation ([place])     a. Dx 10/2012, rate controlled, started on Xarelto.  . Claudication ([place])     a. ABI 10/2012 - R 0.87, L 0.74.  [name] GERD (gastroesophageal reflux disease)   . CKD (chronic kidney disease), stage III   . Breast CA (HCC)     hx  . Abdominal hernia     chronic  . STEMI (ST elevation myocardial infarction) ([place])   . Cellulitis   . CHF (congestive heart failure) ([place])   . CAD (coronary artery disease)     a. 10/2012 - presented w/ STEMI felt due to Takotsubo - mild nonobstructive dz by cath.     Social History  Substance Use Topics  . Smoking status: Former Smoker -- 1.00 packs/day for 55 years    Types: Cigarettes  . Smokeless tobacco: Former Systems developer  . Alcohol Use: No     Current outpatient prescriptions:  .  albuterol (PROVENTIL HFA;VENTOLIN HFA) 108 (90 BASE) MCG/ACT inhaler, Inhale 2 puffs into the lungs every 4 (four) hours as needed for wheezing or shortness of breath., Disp: , Rfl:  .  albuterol (PROVENTIL) (2.5 MG/3ML) 0.083% nebulizer solution, Take 2.5 mg by nebulization 4 (four) times daily as needed for wheezing or shortness of breath., Disp: , Rfl:  .  carvedilol (COREG) 3.125 MG tablet, , Disp: , Rfl:  .  Fluticasone-Salmeterol (ADVAIR) 250-50 MCG/DOSE AEPB, Inhale 1 puff into the lungs 2 (two) times daily., Disp: , Rfl:  .  furosemide (LASIX) 40 MG tablet, Take 40 mg by mouth 2 (two) times daily. , Disp: , Rfl:  .  loratadine (CLARITIN) 10 MG tablet, Take 10 mg by mouth daily., Disp: , Rfl:  .  Multiple Vitamin (MULTIVITAMIN WITH MINERALS) TABS tablet, Take 1 tablet by mouth daily., Disp: , Rfl:  .  omeprazole (PRILOSEC) 20  MG capsule, Take 1 capsule (20 mg total) by mouth daily., Disp: 30 capsule, Rfl: 11 .  predniSONE (DELTASONE) 10 MG tablet, Take 1/2 tablet a day for 1 week as per schedule proivided/, Disp: 30 tablet, Rfl: 0 .  pyridOXINE (VITAMIN B-6) 100 MG tablet, Take 100 mg by mouth daily., Disp: , Rfl:  .  ranitidine (ZANTAC) 150 MG tablet, , Disp: , Rfl:  .  Rivaroxaban (XARELTO) 15 MG TABS tablet, Take 15 mg by mouth at bedtime., Disp: , Rfl:  .  tiotropium (SPIRIVA) 18 MCG inhalation capsule, Place 18 mcg into inhaler and inhale daily., Disp: , Rfl:  .  vitamin B-12 (CYANOCOBALAMIN) 1000 MCG tablet, Take 1,000 mcg by mouth daily., Disp: , Rfl:   Allergies  Allergen Reactions  . Penicillins Itching, Swelling, Rash and Other (See Comments)    Pt is unable to answer additional questions about this medication because it happened so long ago.  [name] [Levofloxacin In D5w]   . Sulfa Antibiotics Other (See Comments)    Reaction:  Unknown     Review of Systems  Constitutional: Positive for weight loss (decreased appetitie). Negative for fever, chills and malaise/fatigue.  HENT: Negative for hearing loss.   Eyes: Negative for blurred vision and double vision.  Respiratory: Negative for cough, shortness of breath and wheezing.   Cardiovascular: Negative for chest pain, palpitations and leg swelling.  Gastrointestinal: Negative for heartburn, abdominal pain and blood in stool.  Genitourinary: Negative for dysuria, urgency and frequency.  Musculoskeletal: Negative for myalgias and joint pain.  Skin: Negative for rash.  Neurological: Negative for dizziness, tremors, weakness and headaches.      Objective  Filed Vitals:   05/29/15 1456  BP: 118/62  Pulse: 87  Temp: 98.7 F (37.1 C)  Resp: 16  Height: _0  (1.676 m)  Weight: 162 lb 6.4 oz (73.664 kg)  SpO2: 90%     Physical Exam  Constitutional: She is well-developed, well-nourished, and in no distress. No distress.  HENT:  Head: Normocephalic and atraumatic.  Right Ear: External ear normal.  Left Ear: External ear normal.  Nose: Mucosal edema and rhinorrhea present. Right sinus exhibits no maxillary sinus tenderness and no frontal sinus tenderness. Left sinus exhibits no maxillary sinus tenderness and no frontal sinus tenderness.  Mouth/Throat: Oropharynx is clear and moist.  Neck: Normal range of motion. Neck supple. No thyromegaly present.  Cardiovascular: Regular rhythm and normal heart sounds.  Exam reveals no gallop and no friction rub.   No murmur heard. Pulmonary/Chest: Effort normal and breath sounds normal. No respiratory distress. She has no wheezes. She has no rales.  Mild coarse breath sounds throughout.  Musculoskeletal: She exhibits no edema.  Lymphadenopathy:    She has no cervical adenopathy.  Skin:  Superficial small flap type laceration of  dorsum of L hand.  Dressed with triple antibiotic ointment and bandaid.  Vitals reviewed.     Recent Results (from the past 2160 hour(s))  Troponin I     Status: Abnormal   Collection Time: 03/03/15  3:01 PM  Result Value Ref Range   Troponin I 0.05 (H) <0.031 ng/mL    Comment: READ BACK AND VERIFIED WITH CASEY PIERCE AT 1555 03/03/15 BY [facility]        PERSISTENTLY INCREASED TROPONIN VALUES IN THE RANGE OF 0.04-0.49 ng/mL CAN BE SEEN IN:       -UNSTABLE ANGINA       -CONGESTIVE HEART FAILURE       -  MYOCARDITIS       -CHEST TRAUMA       -ARRYHTHMIAS       -LATE PRESENTING MYOCARDIAL INFARCTION       -COPD   CLINICAL FOLLOW-UP RECOMMENDED.   Comprehensive metabolic panel     Status: Abnormal   Collection Time: 03/03/15  3:01 PM  Result Value Ref Range   Sodium 133 (L) 135 - 145 mmol/L   Potassium 3.4 (L) 3.5 - 5.1 mmol/L   Chloride 87 (L) 101 - 111 mmol/L   CO2 33 (H) 22 - 32 mmol/L   Glucose, Bld 159 (H) 65 - 99 mg/dL   BUN 18 6 - 20 mg/dL   Creatinine, Ser 1.17 (H) 0.44 - 1.00 mg/dL   Calcium 8.7 (L) 8.9 - 10.3 mg/dL   Total Protein 7.8 6.5 - 8.1 g/dL   Albumin 3.6 3.5 - 5.0 g/dL   AST 22 15 - 41 U/L   ALT 13 (L) 14 - 54 U/L   Alkaline Phosphatase 61 38 - 126 U/L   Total Bilirubin 1.1 0.3 - 1.2 mg/dL   GFR calc non Af Amer 40 (L) >60 mL/min   GFR calc Af Amer 46 (L) >60 mL/min    Comment: (NOTE) The eGFR has been calculated using the CKD EPI equation. This calculation has not been validated in all clinical situations. eGFR's persistently <60 mL/min signify possible Chronic Kidney Disease.    Anion gap 13 5 - 15  CBC     Status: Abnormal   Collection Time: 03/03/15  3:01 PM  Result Value Ref Range   WBC 11.5 (H) 3.6 - 11.0 K/uL   RBC 3.70 (L) 3.80 - 5.20 MIL/uL   Hemoglobin 11.7 (L) 12.0 - 16.0 g/dL   HCT 34.7 (L) 35.0 - 47.0 %   MCV 93.8 80.0 - 100.0 fL   MCH 31.5 26.0 - 34.0 pg   MCHC 33.6 32.0 - 36.0 g/dL   RDW 14.0 11.5 - 14.5 %   Platelets 185 150 - 440  K/uL  Brain natriuretic peptide     Status: Abnormal   Collection Time: 03/03/15  5:15 PM  Result Value Ref Range   B Natriuretic Peptide 241.0 (H) 0.0 - 100.0 pg/mL  Blood gas, arterial     Status: Abnormal   Collection Time: 03/03/15  5:30 PM  Result Value Ref Range   FIO2 0.28    Delivery systems NASAL CANNULA    pH, Arterial 7.49 (H) 7.350 - 7.450   pCO2 arterial 58 (H) 32.0 - 48.0 mmHg   pO2, Arterial 76 (L) 83.0 - 108.0 mmHg   Bicarbonate 44.2 (H) 21.0 - 28.0 mEq/L   Acid-Base Excess 18.0 (H) 0.0 - 3.0 mmol/L   O2 Saturation 96.1 %   Patient temperature 37.0    Collection site RIGHT RADIAL    Sample type ARTERIAL DRAW    Allens test (pass/fail) YES (A) PASS  MRSA PCR Screening     Status: None   Collection Time: 03/04/15  8:30 AM  Result Value Ref Range   MRSA by PCR NEGATIVE NEGATIVE    Comment:        The GeneXpert MRSA Assay (FDA approved for NASAL specimens only), is one component of a comprehensive MRSA colonization surveillance program. It is not intended to diagnose MRSA infection nor to guide or monitor treatment for MRSA infections.   Troponin I     Status: None   Collection Time: 03/06/15 11:53 AM  Result Value Ref Range  Troponin I <0.03 <0.031 ng/mL    Comment:        NO INDICATION OF MYOCARDIAL INJURY.   Creatinine, serum     Status: Abnormal   Collection Time: 03/06/15 11:53 AM  Result Value Ref Range   Creatinine, Ser 1.26 (H) 0.44 - 1.00 mg/dL   GFR calc non Af Amer 36 (L) >60 mL/min   GFR calc Af Amer 42 (L) >60 mL/min    Comment: (NOTE) The eGFR has been calculated using the CKD EPI equation. This calculation has not been validated in all clinical situations. eGFR's persistently <60 mL/min signify possible Chronic Kidney Disease.   Troponin I     Status: None   Collection Time: 03/06/15  3:05 PM  Result Value Ref Range   Troponin I <0.03 <0.031 ng/mL    Comment:        NO INDICATION OF MYOCARDIAL INJURY.   Troponin I     Status:  None   Collection Time: 03/06/15  7:15 PM  Result Value Ref Range   Troponin I <0.03 <0.031 ng/mL    Comment:        NO INDICATION OF MYOCARDIAL INJURY.   CBC     Status: None   Collection Time: 03/07/15  9:52 AM  Result Value Ref Range   WBC 9.9 3.6 - 11.0 K/uL   RBC 4.08 3.80 - 5.20 MIL/uL   Hemoglobin 12.5 12.0 - 16.0 g/dL   HCT 38.4 35.0 - 47.0 %   MCV 94.2 80.0 - 100.0 fL   MCH 30.7 26.0 - 34.0 pg   MCHC 32.6 32.0 - 36.0 g/dL   RDW 13.8 11.5 - 14.5 %   Platelets 237 150 - 440 K/uL  Urine culture     Status: None   Collection Time: 03/28/15 12:00 AM  Result Value Ref Range   Urine Culture, Routine Final report    Urine Culture result 1 Comment     Comment: Mixed urogenital flora Greater than 100,000 colony forming units per mL   POCT Urinalysis Dipstick     Status: Abnormal   Collection Time: 03/28/15 10:10 AM  Result Value Ref Range   Color, UA yellow    Clarity, UA clear    Glucose, UA negative    Bilirubin, UA negative    Ketones, UA negative    Spec Grav, UA 1.010    Blood, UA trace    pH, UA 5.0    Protein, UA negative    Urobilinogen, UA negative    Nitrite, UA negative    Leukocytes, UA Trace (A) Negative  CBC with Differential     Status: Abnormal   Collection Time: 04/10/15  2:09 PM  Result Value Ref Range   WBC 7.1 3.6 - 11.0 K/uL   RBC 3.70 (L) 3.80 - 5.20 MIL/uL   Hemoglobin 11.4 (L) 12.0 - 16.0 g/dL   HCT 34.9 (L) 35.0 - 47.0 %   MCV 94.2 80.0 - 100.0 fL   MCH 30.9 26.0 - 34.0 pg   MCHC 32.8 32.0 - 36.0 g/dL   RDW 14.8 (H) 11.5 - 14.5 %   Platelets 240 150 - 440 K/uL   Neutrophils Relative % 59 %   Neutro Abs 4.2 1.4 - 6.5 K/uL   Lymphocytes Relative 30 %   Lymphs Abs 2.1 1.0 - 3.6 K/uL   Monocytes Relative 9 %   Monocytes Absolute 0.6 0.2 - 0.9 K/uL   Eosinophils Relative 1 %   Eosinophils Absolute 0.1  0 - 0.7 K/uL   Basophils Relative 1 %   Basophils Absolute 0.1 0 - 0.1 K/uL  Basic metabolic panel     Status: Abnormal   Collection  Time: 04/10/15  2:09 PM  Result Value Ref Range   Sodium 141 135 - 145 mmol/L   Potassium 3.2 (L) 3.5 - 5.1 mmol/L   Chloride 92 (L) 101 - 111 mmol/L   CO2 40 (H) 22 - 32 mmol/L   Glucose, Bld 143 (H) 65 - 99 mg/dL   BUN 17 6 - 20 mg/dL   Creatinine, Ser 1.04 (H) 0.44 - 1.00 mg/dL   Calcium 9.0 8.9 - 10.3 mg/dL   GFR calc non Af Amer 46 (L) >60 mL/min   GFR calc Af Amer 53 (L) >60 mL/min    Comment: (NOTE) The eGFR has been calculated using the CKD EPI equation. This calculation has not been validated in all clinical situations. eGFR's persistently <60 mL/min signify possible Chronic Kidney Disease.    Anion gap 9 5 - 15  Troponin I     Status: None   Collection Time: 04/10/15  2:09 PM  Result Value Ref Range   Troponin I <0.03 <0.031 ng/mL    Comment:        NO INDICATION OF MYOCARDIAL INJURY.   Brain natriuretic peptide     Status: Abnormal   Collection Time: 04/10/15  2:09 PM  Result Value Ref Range   B Natriuretic Peptide 185.0 (H) 0.0 - 100.0 pg/mL  Urinalysis complete, with microscopic ([facility] only)     Status: Abnormal   Collection Time: 04/10/15  2:28 PM  Result Value Ref Range   Color, Urine STRAW (A) YELLOW   APPearance CLEAR (A) CLEAR   Glucose, UA 50 (A) NEGATIVE mg/dL   Bilirubin Urine NEGATIVE NEGATIVE   Ketones, ur NEGATIVE NEGATIVE mg/dL   Specific Gravity, Urine 1.004 (L) 1.005 - 1.030   Hgb urine dipstick 1+ (A) NEGATIVE   pH 8.0 5.0 - 8.0   Protein, ur NEGATIVE NEGATIVE mg/dL   Nitrite NEGATIVE NEGATIVE   Leukocytes, UA TRACE (A) NEGATIVE   RBC / HPF 0-5 0 - 5 RBC/hpf   WBC, UA 6-30 0 - 5 WBC/hpf   Bacteria, UA NONE SEEN NONE SEEN   Squamous Epithelial / LPF 0-5 (A) NONE SEEN  Basic metabolic panel     Status: Abnormal   Collection Time: 04/11/15  4:08 AM  Result Value Ref Range   Sodium 139 135 - 145 mmol/L   Potassium 3.8 3.5 - 5.1 mmol/L   Chloride 93 (L) 101 - 111 mmol/L   CO2 37 (H) 22 - 32 mmol/L   Glucose, Bld 250 (H) 65 - 99 mg/dL    BUN 25 (H) 6 - 20 mg/dL   Creatinine, Ser 1.60 (H) 0.44 - 1.00 mg/dL   Calcium 8.9 8.9 - 10.3 mg/dL   GFR calc non Af Amer 27 (L) >60 mL/min   GFR calc Af Amer 32 (L) >60 mL/min    Comment: (NOTE) The eGFR has been calculated using the CKD EPI equation. This calculation has not been validated in all clinical situations. eGFR's persistently <60 mL/min signify possible Chronic Kidney Disease.    Anion gap 9 5 - 15  CBC     Status: Abnormal   Collection Time: 04/11/15  4:08 AM  Result Value Ref Range   WBC 7.9 3.6 - 11.0 K/uL   RBC 3.62 (L) 3.80 - 5.20 MIL/uL   Hemoglobin 11.6 (  L) 12.0 - 16.0 g/dL   HCT 33.8 (L) 35.0 - 47.0 %   MCV 93.3 80.0 - 100.0 fL   MCH 31.9 26.0 - 34.0 pg   MCHC 34.2 32.0 - 36.0 g/dL   RDW 14.8 (H) 11.5 - 14.5 %   Platelets 218 150 - 440 K/uL  Troponin I     Status: None   Collection Time: 04/12/15 12:16 AM  Result Value Ref Range   Troponin I <0.03 <0.031 ng/mL    Comment:        NO INDICATION OF MYOCARDIAL INJURY.   Basic metabolic panel     Status: Abnormal   Collection Time: 04/12/15  4:08 AM  Result Value Ref Range   Sodium 136 135 - 145 mmol/L   Potassium 4.5 3.5 - 5.1 mmol/L   Chloride 96 (L) 101 - 111 mmol/L   CO2 37 (H) 22 - 32 mmol/L   Glucose, Bld 176 (H) 65 - 99 mg/dL   BUN 34 (H) 6 - 20 mg/dL   Creatinine, Ser 1.19 (H) 0.44 - 1.00 mg/dL   Calcium 8.6 (L) 8.9 - 10.3 mg/dL   GFR calc non Af Amer 39 (L) >60 mL/min   GFR calc Af Amer 45 (L) >60 mL/min    Comment: (NOTE) The eGFR has been calculated using the CKD EPI equation. This calculation has not been validated in all clinical situations. eGFR's persistently <60 mL/min signify possible Chronic Kidney Disease.    Anion gap 3 (L) 5 - 15  Basic metabolic panel     Status: Abnormal   Collection Time: 04/13/15  3:53 AM  Result Value Ref Range   Sodium 138 135 - 145 mmol/L   Potassium 4.9 3.5 - 5.1 mmol/L   Chloride 94 (L) 101 - 111 mmol/L   CO2 38 (H) 22 - 32 mmol/L   Glucose, Bld  188 (H) 65 - 99 mg/dL   BUN 36 (H) 6 - 20 mg/dL   Creatinine, Ser 1.08 (H) 0.44 - 1.00 mg/dL   Calcium 9.1 8.9 - 10.3 mg/dL   GFR calc non Af Amer 44 (L) >60 mL/min   GFR calc Af Amer 51 (L) >60 mL/min    Comment: (NOTE) The eGFR has been calculated using the CKD EPI equation. This calculation has not been validated in all clinical situations. eGFR's persistently <60 mL/min signify possible Chronic Kidney Disease.    Anion gap 6 5 - 15  CBC     Status: Abnormal   Collection Time: 04/13/15  3:53 AM  Result Value Ref Range   WBC 8.8 3.6 - 11.0 K/uL   RBC 3.55 (L) 3.80 - 5.20 MIL/uL   Hemoglobin 11.0 (L) 12.0 - 16.0 g/dL   HCT 33.6 (L) 35.0 - 47.0 %   MCV 94.7 80.0 - 100.0 fL   MCH 31.0 26.0 - 34.0 pg   MCHC 32.8 32.0 - 36.0 g/dL   RDW 15.1 (H) 11.5 - 14.5 %   Platelets 232 150 - 440 K/uL  Glucose, capillary     Status: Abnormal   Collection Time: 04/13/15  7:27 AM  Result Value Ref Range   Glucose-Capillary 163 (H) 65 - 99 mg/dL   Comment 1 Notify RN    Comment 2 Document in Chart   Troponin I (q 6hr x 3)     Status: None   Collection Time: 04/13/15  2:25 PM  Result Value Ref Range   Troponin I <0.03 <0.031 ng/mL    Comment:  NO INDICATION OF MYOCARDIAL INJURY.   Troponin I (q 6hr x 3)     Status: None   Collection Time: 04/14/15  1:49 AM  Result Value Ref Range   Troponin I <0.03 <0.031 ng/mL    Comment:        NO INDICATION OF MYOCARDIAL INJURY.   Basic metabolic panel     Status: Abnormal   Collection Time: 04/14/15  4:38 AM  Result Value Ref Range   Sodium 138 135 - 145 mmol/L   Potassium 4.9 3.5 - 5.1 mmol/L   Chloride 97 (L) 101 - 111 mmol/L   CO2 38 (H) 22 - 32 mmol/L   Glucose, Bld 213 (H) 65 - 99 mg/dL   BUN 37 (H) 6 - 20 mg/dL   Creatinine, Ser 1.06 (H) 0.44 - 1.00 mg/dL   Calcium 8.9 8.9 - 10.3 mg/dL   GFR calc non Af Amer 45 (L) >60 mL/min   GFR calc Af Amer 52 (L) >60 mL/min    Comment: (NOTE) The eGFR has been calculated using the CKD EPI  equation. This calculation has not been validated in all clinical situations. eGFR's persistently <60 mL/min signify possible Chronic Kidney Disease.    Anion gap 3 (L) 5 - 15  Glucose, capillary     Status: Abnormal   Collection Time: 04/14/15  7:48 AM  Result Value Ref Range   Glucose-Capillary 115 (H) 65 - 99 mg/dL  Glucose, capillary     Status: Abnormal   Collection Time: 04/14/15 11:32 AM  Result Value Ref Range   Glucose-Capillary 273 (H) 65 - 99 mg/dL     Assessment & Plan  1. Upper respiratory infection  - loratadine (CLARITIN) 10 MG tablet; Take 1 tablet (10 mg total) by mouth daily.  Dispense: 30 tablet; Refill: 0  2. Bronchitis  - azithromycin (ZITHROMAX) 250 MG tablet; Take 2 tablets on day 1, then 1 tablet daily on days 2-5  Dispense: 6 tablet; Refill: 0 - dextromethorphan-guaiFENesin (MUCINEX DM) 30-600 MG 12hr tablet; Take 1 tablet by mouth 2 (two) times daily.  Dispense: 20 tablet; Refill: 0

## 2015-05-29 NOTE — Addendum Note (Signed)
Addendum  created 05/29/15 [id number] by Gunnar Bulla, MD   Modules edited: Anesthesia Attestations

## 2015-05-29 NOTE — Telephone Encounter (Signed)
error 

## 2015-05-31 ENCOUNTER — Other Ambulatory Visit: Payer: Self-pay | Admitting: *Deleted

## 2015-05-31 ENCOUNTER — Ambulatory Visit: Payer: Medicare Other | Admitting: Family Medicine

## 2015-05-31 NOTE — Patient Outreach (Signed)
RNCM called pt as part of transition of care program. Pt answered and immediately stated she was trying to sleep and hung up.   Plan: RNCM will attempt a call back later this afternoon.Merlene Morse Fatiha Guzy RN, BSN  [facility] Care Management [phone])

## 2015-06-01 ENCOUNTER — Other Ambulatory Visit: Payer: Self-pay | Admitting: *Deleted

## 2015-06-01 NOTE — Patient Outreach (Signed)
[facility] Social Worker spoke with RNCM to let her know she was closing pt to Education officer, museum. RNCM stated she had planned to call pt today also. Social Worker made RNCM aware that pt was not feeling well and had stated to day was not a good day to talk.  Plan: RNCM will call pt tomorrow.  Rutherford Limerick RN, BSN  [facility] Care Management [phone])

## 2015-06-01 NOTE — Patient Outreach (Signed)
[facility]) Care Management  06/01/2015  KAI KENNON 24-Apr-1923 [id number]   Phone call to patient to assess for continued social work involvement.  Per patient, "today is not a good day"  "My breathing is bad"  Per patient, she saw the doctor on Tuesday and she is being treated for Bronchitis.  Patient verbalized no social work needs at this time.  Case to be closed to social work. RNCM Janci Minor to ne notified.    Sheralyn Boatman [facility] Care Management [phone]

## 2015-06-02 ENCOUNTER — Telehealth: Payer: Self-pay

## 2015-06-02 ENCOUNTER — Other Ambulatory Visit: Payer: Self-pay | Admitting: *Deleted

## 2015-06-02 NOTE — Telephone Encounter (Signed)
Patient called wanting to increase her prednisone to 1 pill instead of 1/2. Declined SOB,Wheezing, waking cough, or tightness in chest. Complains she coughs and feels smothered. Advised per Dr Luan Pulling to take 1/2 twice a day and to go to ER over Holiday weekend if feels any wheezing,SOB, or tightness. [facility]

## 2015-06-02 NOTE — Telephone Encounter (Signed)
Agree-jh 

## 2015-06-02 NOTE — Patient Outreach (Signed)
RNCM called and spoke with pt's daughter in law related to pt not feeling well. Daughter in law had been with pt last night and stated she was feeling some better. She also stated pt was taking medications as prescribed. Daughter in law stated pt was going to be with her and her family for the weekend and they would be looking after her. RNCM confirmed that daughter in law had 24 hour nurse line in case pt had needs through the holidays.    Plan: RNCM will call pt next week as continued part of the transition of care program.  Rutherford Limerick RN, BSN  [facility] Care Management [phone]

## 2015-06-09 ENCOUNTER — Other Ambulatory Visit: Payer: Self-pay | Admitting: *Deleted

## 2015-06-09 NOTE — Patient Outreach (Signed)
RNCM called pt as part of the transition of care program. Pt stating she was feeling much better and expressed having a nice Christmas holiday. Pt denies increase in SOB, weight, edema, cough or mucus. RNCM made a plan to see pt in her home to evaluate her for possible transfer to a health coach. Pt stating she has all medications and supplies as needed at this time.    Plan: RNCM will make a home visit to pt 06/20/15 @1pm   Athen Riel RN, BSN  [facility] Care Management [phone])

## 2015-06-16 DIAGNOSIS — J449 Chronic obstructive pulmonary disease, unspecified: Secondary | ICD-10-CM | POA: Diagnosis not present

## 2015-06-21 ENCOUNTER — Telehealth: Payer: Self-pay | Admitting: Family Medicine

## 2015-06-21 ENCOUNTER — Other Ambulatory Visit: Payer: Self-pay | Admitting: *Deleted

## 2015-06-21 DIAGNOSIS — J449 Chronic obstructive pulmonary disease, unspecified: Secondary | ICD-10-CM | POA: Diagnosis not present

## 2015-06-21 DIAGNOSIS — I509 Heart failure, unspecified: Secondary | ICD-10-CM

## 2015-06-21 NOTE — Patient Outreach (Signed)
RNCM called pt to see if RNCM could come by and give pt a "slylus" to assist her with her [place] healthcare heart failure tablet and also do an assessment that had to be cancelled earlier this week related to inclement weather. Pt stated she was home and it was fine to come by."   Subjective: " I am doing well except for feeling so sleepy all the time" " I bought some ginko-biloba and I am unsure if I should take it." "I would like to know more about my heart failure."   Objective: Blood pressure 122/64, pulse 77, resp. rate 18, height 1.676 m (5\' 6" ), weight 160 lb (72.576 kg), SpO2 97 %.   Meds reviewed. RNCM talked with a pharmacist who advised pt not to take ginko-biloba related to a possible increase in risk of bleeding especially with pt taking Xerelto. RNCM also talked with pt about having MD draw a B-12 lab to check levels that could assist in determining if low energy level could be coming from this.   Assessment: Alert and oriented x 3. Pleasant elderly patient. Home in fair repair. Denies need for transportation. Denies need for assistance paying for medications. Denies falls. Denies needing assistance with ADLS, and reports DIL assists with IADLS.   COPD: Lungs clear bilaterally. Using 02 at 2lpm. Pt stating she has a slight cough but not having any discolored mucus. Denies increased SOB. Reports using nebulizer 2-3 times per day. Encouraged pt to change  02 tubing regularly. Pt has not received smaller portable 02 tank requested from MD office yet.  HF: Pt weighing every day but forgetting to record weights. She stated she has not recognized a weight gain. Pt with 1+ edema to bilateral lower extremities. Pt's lower extremities are chronically discolored from poor circulation, no s/s of cellulitis at this time.  Denies increased SOB, decreased appetite. Pt did note she had been very sleepy on a daily basis and plans to discuss this with her MD at her next appt. HF zones reviewed and pt  noted to be more familiar with zones but could use review. Pt interested in [place] coach. RNCM gave pt a stylus to assist pt in the use of her Heart Failure tablet provided by the insurance co, related to the tablet would not work with just the touch of her fingertip.   Plan: RNCM will transfer pt to health coach related to care management goals/transition of care complete.   Rutherford Limerick RN, BSN  [facility] Care Management [phone])

## 2015-06-21 NOTE — Telephone Encounter (Signed)
Pt called wanted to know if she could take ginkgobiloba, pt  wanted to know if she could take this over the counter med. Pt call back # [phone]

## 2015-06-22 NOTE — Telephone Encounter (Signed)
Do not think it would do her any good and might interact with her current meds. Not recommended.-jh

## 2015-06-22 NOTE — Telephone Encounter (Signed)
Called patient back and let her know Gingko not recommended.

## 2015-06-23 ENCOUNTER — Ambulatory Visit: Payer: Medicare Other | Admitting: Cardiovascular Disease

## 2015-06-27 ENCOUNTER — Emergency Department: Payer: Medicare Other

## 2015-06-27 ENCOUNTER — Inpatient Hospital Stay
Admission: EM | Admit: 2015-06-27 | Discharge: 2015-06-30 | DRG: 191 | Disposition: A | Discharge status: Skilled Nursing Facility | Payer: Medicare Other | Attending: Specialist | Admitting: Specialist

## 2015-06-27 ENCOUNTER — Encounter: Payer: Self-pay | Admitting: Emergency Medicine

## 2015-06-27 DIAGNOSIS — I5042 Chronic combined systolic (congestive) and diastolic (congestive) heart failure: Secondary | ICD-10-CM | POA: Diagnosis present

## 2015-06-27 DIAGNOSIS — Z7951 Long term (current) use of inhaled steroids: Secondary | ICD-10-CM

## 2015-06-27 DIAGNOSIS — Z79899 Other long term (current) drug therapy: Secondary | ICD-10-CM | POA: Diagnosis not present

## 2015-06-27 DIAGNOSIS — I129 Hypertensive chronic kidney disease with stage 1 through stage 4 chronic kidney disease, or unspecified chronic kidney disease: Secondary | ICD-10-CM | POA: Diagnosis not present

## 2015-06-27 DIAGNOSIS — I5181 Takotsubo syndrome: Secondary | ICD-10-CM | POA: Diagnosis not present

## 2015-06-27 DIAGNOSIS — K219 Gastro-esophageal reflux disease without esophagitis: Secondary | ICD-10-CM | POA: Diagnosis not present

## 2015-06-27 DIAGNOSIS — Z7901 Long term (current) use of anticoagulants: Secondary | ICD-10-CM

## 2015-06-27 DIAGNOSIS — Z853 Personal history of malignant neoplasm of breast: Secondary | ICD-10-CM

## 2015-06-27 DIAGNOSIS — I2510 Atherosclerotic heart disease of native coronary artery without angina pectoris: Secondary | ICD-10-CM | POA: Diagnosis present

## 2015-06-27 DIAGNOSIS — J441 Chronic obstructive pulmonary disease with (acute) exacerbation: Principal | ICD-10-CM | POA: Diagnosis present

## 2015-06-27 DIAGNOSIS — Z87891 Personal history of nicotine dependence: Secondary | ICD-10-CM | POA: Diagnosis not present

## 2015-06-27 DIAGNOSIS — R2689 Other abnormalities of gait and mobility: Secondary | ICD-10-CM | POA: Diagnosis not present

## 2015-06-27 DIAGNOSIS — I10 Essential (primary) hypertension: Secondary | ICD-10-CM | POA: Diagnosis not present

## 2015-06-27 DIAGNOSIS — Z880 Allergy status to penicillin: Secondary | ICD-10-CM | POA: Diagnosis not present

## 2015-06-27 DIAGNOSIS — J9610 Chronic respiratory failure, unspecified whether with hypoxia or hypercapnia: Secondary | ICD-10-CM | POA: Diagnosis not present

## 2015-06-27 DIAGNOSIS — I4891 Unspecified atrial fibrillation: Secondary | ICD-10-CM | POA: Diagnosis present

## 2015-06-27 DIAGNOSIS — R0902 Hypoxemia: Secondary | ICD-10-CM | POA: Diagnosis not present

## 2015-06-27 DIAGNOSIS — I130 Hypertensive heart and chronic kidney disease with heart failure and stage 1 through stage 4 chronic kidney disease, or unspecified chronic kidney disease: Secondary | ICD-10-CM | POA: Diagnosis present

## 2015-06-27 DIAGNOSIS — N183 Chronic kidney disease, stage 3 (moderate): Secondary | ICD-10-CM | POA: Diagnosis not present

## 2015-06-27 DIAGNOSIS — J449 Chronic obstructive pulmonary disease, unspecified: Secondary | ICD-10-CM | POA: Diagnosis not present

## 2015-06-27 DIAGNOSIS — I482 Chronic atrial fibrillation: Secondary | ICD-10-CM | POA: Diagnosis present

## 2015-06-27 DIAGNOSIS — I5022 Chronic systolic (congestive) heart failure: Secondary | ICD-10-CM | POA: Diagnosis not present

## 2015-06-27 DIAGNOSIS — I252 Old myocardial infarction: Secondary | ICD-10-CM

## 2015-06-27 DIAGNOSIS — Z9981 Dependence on supplemental oxygen: Secondary | ICD-10-CM

## 2015-06-27 DIAGNOSIS — K5732 Diverticulitis of large intestine without perforation or abscess without bleeding: Secondary | ICD-10-CM | POA: Diagnosis not present

## 2015-06-27 DIAGNOSIS — R531 Weakness: Secondary | ICD-10-CM | POA: Diagnosis not present

## 2015-06-27 DIAGNOSIS — R05 Cough: Secondary | ICD-10-CM | POA: Diagnosis not present

## 2015-06-27 DIAGNOSIS — Z885 Allergy status to narcotic agent status: Secondary | ICD-10-CM | POA: Diagnosis not present

## 2015-06-27 DIAGNOSIS — Z882 Allergy status to sulfonamides status: Secondary | ICD-10-CM | POA: Diagnosis not present

## 2015-06-27 DIAGNOSIS — J111 Influenza due to unidentified influenza virus with other respiratory manifestations: Secondary | ICD-10-CM | POA: Diagnosis not present

## 2015-06-27 DIAGNOSIS — Z743 Need for continuous supervision: Secondary | ICD-10-CM | POA: Diagnosis not present

## 2015-06-27 DIAGNOSIS — I5021 Acute systolic (congestive) heart failure: Secondary | ICD-10-CM

## 2015-06-27 DIAGNOSIS — R0602 Shortness of breath: Secondary | ICD-10-CM | POA: Diagnosis not present

## 2015-06-27 DIAGNOSIS — I739 Peripheral vascular disease, unspecified: Secondary | ICD-10-CM | POA: Diagnosis not present

## 2015-06-27 DIAGNOSIS — K449 Diaphragmatic hernia without obstruction or gangrene: Secondary | ICD-10-CM | POA: Diagnosis not present

## 2015-06-27 LAB — CBC WITH DIFFERENTIAL/PLATELET
BASOS ABS: 0 10*3/uL (ref 0–0.1)
BASOS PCT: 0 %
EOS ABS: 0 10*3/uL (ref 0–0.7)
Eosinophils Relative: 0 %
HEMATOCRIT: 39.1 % (ref 35.0–47.0)
HEMOGLOBIN: 12.7 g/dL (ref 12.0–16.0)
Lymphocytes Relative: 12 %
Lymphs Abs: 0.9 10*3/uL — ABNORMAL LOW (ref 1.0–3.6)
MCH: 30.5 pg (ref 26.0–34.0)
MCHC: 32.4 g/dL (ref 32.0–36.0)
MCV: 94.1 fL (ref 80.0–100.0)
MONOS PCT: 10 %
Monocytes Absolute: 0.7 10*3/uL (ref 0.2–0.9)
NEUTROS ABS: 5.9 10*3/uL (ref 1.4–6.5)
NEUTROS PCT: 78 %
Platelets: 153 10*3/uL (ref 150–440)
RBC: 4.15 MIL/uL (ref 3.80–5.20)
RDW: 14.3 % (ref 11.5–14.5)
WBC: 7.5 10*3/uL (ref 3.6–11.0)

## 2015-06-27 LAB — BASIC METABOLIC PANEL
ANION GAP: 9 (ref 5–15)
BUN: 15 mg/dL (ref 6–20)
CALCIUM: 8.7 mg/dL — AB (ref 8.9–10.3)
CHLORIDE: 88 mmol/L — AB (ref 101–111)
CO2: 36 mmol/L — ABNORMAL HIGH (ref 22–32)
CREATININE: 1.11 mg/dL — AB (ref 0.44–1.00)
GFR, EST AFRICAN AMERICAN: 49 mL/min — AB (ref >60)
GFR, EST NON AFRICAN AMERICAN: 42 mL/min — AB (ref >60)
GLUCOSE: 169 mg/dL — AB (ref 65–99)
Potassium: 3.8 mmol/L (ref 3.5–5.1)
SODIUM: 133 mmol/L — AB (ref 135–145)

## 2015-06-27 LAB — INFLUENZA PANEL BY PCR (TYPE A & B)
H1N1 flu by pcr: NOT DETECTED
INFLAPCR: POSITIVE — AB
INFLBPCR: NEGATIVE

## 2015-06-27 LAB — TROPONIN I
TROPONIN I: 0.14 ng/mL — AB (ref <0.031)
TROPONIN I: 0.14 ng/mL — AB (ref <0.031)
Troponin I: 0.03 ng/mL (ref <0.031)

## 2015-06-27 MED ORDER — FAMOTIDINE 20 MG PO TABS
20.0000 mg | ORAL_TABLET | Freq: Every day | ORAL | Status: DC
  Administered 2015-06-27 – 2015-06-29 (×3): 20 mg via ORAL
  Filled 2015-06-27 (×3): qty 1

## 2015-06-27 MED ORDER — ACETAMINOPHEN 650 MG RE SUPP: 650.0000 mg | Freq: Four times a day (QID) | RECTAL | Status: DC | PRN

## 2015-06-27 MED ORDER — ALBUTEROL SULFATE (2.5 MG/3ML) 0.083% IN NEBU
7.5000 mg | INHALATION_SOLUTION | Freq: Once | RESPIRATORY_TRACT | Status: AC
  Administered 2015-06-27: 7.5 mg via RESPIRATORY_TRACT

## 2015-06-27 MED ORDER — VITAMIN B-12 1000 MCG PO TABS
1000.0000 ug | ORAL_TABLET | Freq: Every day | ORAL | Status: DC
  Administered 2015-06-27 – 2015-06-30 (×4): 1000 ug via ORAL
  Filled 2015-06-27 (×4): qty 1

## 2015-06-27 MED ORDER — ONDANSETRON HCL 4 MG/2ML IJ SOLN
4.0000 mg | Freq: Once | INTRAMUSCULAR | Status: AC
  Administered 2015-06-27: 4 mg via INTRAVENOUS

## 2015-06-27 MED ORDER — DOXYCYCLINE HYCLATE 100 MG IV SOLR
100.0000 mg | Freq: Two times a day (BID) | INTRAVENOUS | Status: DC
  Administered 2015-06-27 – 2015-06-28 (×3): 100 mg via INTRAVENOUS
  Filled 2015-06-27 (×5): qty 100

## 2015-06-27 MED ORDER — FUROSEMIDE 40 MG PO TABS
80.0000 mg | ORAL_TABLET | Freq: Two times a day (BID) | ORAL | Status: DC
  Administered 2015-06-27 – 2015-06-30 (×6): 80 mg via ORAL
  Filled 2015-06-27 (×2): qty 2
  Filled 2015-06-27 (×2): qty 1
  Filled 2015-06-27: qty 2
  Filled 2015-06-27: qty 1

## 2015-06-27 MED ORDER — METHYLPREDNISOLONE SODIUM SUCC 40 MG IJ SOLR
40.0000 mg | Freq: Four times a day (QID) | INTRAMUSCULAR | Status: DC
  Administered 2015-06-27 – 2015-06-28 (×4): 40 mg via INTRAVENOUS
  Filled 2015-06-27 (×4): qty 1

## 2015-06-27 MED ORDER — ALBUTEROL SULFATE (2.5 MG/3ML) 0.083% IN NEBU
2.5000 mg | INHALATION_SOLUTION | Freq: Four times a day (QID) | RESPIRATORY_TRACT | Status: DC
  Administered 2015-06-27 – 2015-06-30 (×12): 2.5 mg via RESPIRATORY_TRACT
  Filled 2015-06-27 (×13): qty 3

## 2015-06-27 MED ORDER — BUDESONIDE 0.25 MG/2ML IN SUSP
0.2500 mg | Freq: Two times a day (BID) | RESPIRATORY_TRACT | Status: DC
  Administered 2015-06-27 – 2015-06-30 (×6): 0.25 mg via RESPIRATORY_TRACT
  Filled 2015-06-27 (×7): qty 2

## 2015-06-27 MED ORDER — LORATADINE 10 MG PO TABS
10.0000 mg | ORAL_TABLET | Freq: Every day | ORAL | Status: DC
  Administered 2015-06-27 – 2015-06-30 (×4): 10 mg via ORAL
  Filled 2015-06-27 (×4): qty 1

## 2015-06-27 MED ORDER — CARVEDILOL 6.25 MG PO TABS
6.2500 mg | ORAL_TABLET | Freq: Two times a day (BID) | ORAL | Status: DC
  Administered 2015-06-27 – 2015-06-30 (×6): 6.25 mg via ORAL
  Filled 2015-06-27 (×6): qty 1

## 2015-06-27 MED ORDER — IPRATROPIUM-ALBUTEROL 0.5-2.5 (3) MG/3ML IN SOLN
9.0000 mL | Freq: Once | RESPIRATORY_TRACT | Status: AC
  Administered 2015-06-27: 9 mL via RESPIRATORY_TRACT
  Filled 2015-06-27: qty 9

## 2015-06-27 MED ORDER — VITAMIN B-6 50 MG PO TABS
100.0000 mg | ORAL_TABLET | Freq: Every day | ORAL | Status: DC
  Administered 2015-06-27 – 2015-06-30 (×4): 100 mg via ORAL
  Filled 2015-06-27 (×2): qty 2
  Filled 2015-06-27: qty 1
  Filled 2015-06-27: qty 2

## 2015-06-27 MED ORDER — SODIUM CHLORIDE 0.9 % IJ SOLN
3.0000 mL | Freq: Two times a day (BID) | INTRAMUSCULAR | Status: DC
  Administered 2015-06-27 – 2015-06-29 (×5): 3 mL via INTRAVENOUS

## 2015-06-27 MED ORDER — ADULT MULTIVITAMIN W/MINERALS CH
1.0000 | ORAL_TABLET | Freq: Every day | ORAL | Status: DC
  Administered 2015-06-27 – 2015-06-30 (×4): 1 via ORAL
  Filled 2015-06-27 (×4): qty 1

## 2015-06-27 MED ORDER — PANTOPRAZOLE SODIUM 40 MG PO TBEC
40.0000 mg | DELAYED_RELEASE_TABLET | Freq: Every day | ORAL | Status: DC
  Administered 2015-06-27 – 2015-06-30 (×4): 40 mg via ORAL
  Filled 2015-06-27 (×4): qty 1

## 2015-06-27 MED ORDER — TIOTROPIUM BROMIDE MONOHYDRATE 18 MCG IN CAPS
18.0000 ug | ORAL_CAPSULE | Freq: Every day | RESPIRATORY_TRACT | Status: DC
  Administered 2015-06-27 – 2015-06-30 (×4): 18 ug via RESPIRATORY_TRACT
  Filled 2015-06-27 (×2): qty 5

## 2015-06-27 MED ORDER — SENNOSIDES-DOCUSATE SODIUM 8.6-50 MG PO TABS: 1.0000 | ORAL_TABLET | Freq: Every evening | ORAL | Status: DC | PRN

## 2015-06-27 MED ORDER — ACETAMINOPHEN 325 MG PO TABS: 650.0000 mg | ORAL_TABLET | Freq: Four times a day (QID) | ORAL | Status: DC | PRN

## 2015-06-27 MED ORDER — IPRATROPIUM-ALBUTEROL 0.5-2.5 (3) MG/3ML IN SOLN
RESPIRATORY_TRACT | Status: AC
  Filled 2015-06-27: qty 9

## 2015-06-27 MED ORDER — DOXYCYCLINE HYCLATE 100 MG PO TABS
100.0000 mg | ORAL_TABLET | Freq: Two times a day (BID) | ORAL | Status: DC
  Filled 2015-06-27: qty 1

## 2015-06-27 MED ORDER — RIVAROXABAN 15 MG PO TABS
15.0000 mg | ORAL_TABLET | Freq: Every day | ORAL | Status: DC
Start: 2015-06-27 — End: 2015-06-28
  Administered 2015-06-27: 15 mg via ORAL
  Filled 2015-06-27 (×2): qty 1

## 2015-06-27 MED ORDER — METHYLPREDNISOLONE SODIUM SUCC 125 MG IJ SOLR
125.0000 mg | Freq: Once | INTRAMUSCULAR | Status: AC
  Administered 2015-06-27: 125 mg via INTRAVENOUS
  Filled 2015-06-27: qty 2

## 2015-06-27 MED ORDER — OSELTAMIVIR PHOSPHATE 75 MG PO CAPS
75.0000 mg | ORAL_CAPSULE | Freq: Two times a day (BID) | ORAL | Status: DC
  Administered 2015-06-27: 75 mg via ORAL
  Filled 2015-06-27: qty 1

## 2015-06-27 NOTE — ED Notes (Signed)
Pt here via EMS from home with c/o cp, cough, sob that has been progressively worsening over the past week. Pt states she was "up giving herself breathing tx all night last night." Junky cough and pursed lip breathing noted. Pt is on 2.5L of home O2 continuously via [place].

## 2015-06-27 NOTE — H&P (Signed)
[facility] [place] NAME: [name] [name]    MR#:  [id number]  DATE OF BIRTH:  Jul 06, 1922  DATE OF ADMISSION:  06/27/2015  PRIMARY CARE PHYSICIAN: [name], MD   REQUESTING/REFERRING PHYSICIAN:  Dr Dineen Kid  CHIEF COMPLAINT:   Chief Complaint  Patient presents with  . Chest Pain  . Shortness of Breath  . Cough    HISTORY OF PRESENT ILLNESS:  [name] [name]  is a 82 y.o. female with a known history of chronic respiratory failure with COPD, atrial fibrillation, CHF and CAD here at she presents with shortness of breath with a cold since Sunday. She has not been eating much since Sunday. No sleep last night. She's been coughing a lot and developed chest pains lasting a few minutes at a time. They were stabbing in nature and comes and goes. She's noticed some fever not measured some chills and sweats. She's had a runny nose and a scratchy throat. She is coughing up thick white foam and yellow tint. In the ER, she was thought to have a COPD exacerbation and hospitalist services were contacted for further evaluation.  PAST MEDICAL HISTORY:   Past Medical History  Diagnosis Date  . HTN (hypertension)   . COPD (chronic obstructive pulmonary disease) (HCC)     a. on home O2 nightly.  . Diverticulosis   . Takotsubo cardiomyopathy 10/2012    a. 10/2012: presented w/ cardiogenic shock/systolic CHF/VDRF - EF 123456 by cath, 40% by f/u echo - cath with mild nonobstructive CAD. b. Not discharged on ACEI due to hypotension.  . Cardiogenic shock ([place])     a. 10/2012 - due to Takotsubo CM.  [name] Respiratory failure ([place])     a. On home O2 nightly. b. VDRF 10/2012 - due to CAP/pulm edema in setting of cardiogenic shock/Takotsubo CM.  [name] CAP (community acquired pneumonia)     a. 10/2012 - will need f/u CXR in June 2014 to ensure clearance.  . Esophageal stricture     a. s/p dilitation 10/2012.  [name] Pleural effusion, right     a. s/p thoracentesis 10/28/12.  .  Atrial fibrillation ([place])     a. Dx 10/2012, rate controlled, started on Xarelto.  . Claudication ([place])     a. ABI 10/2012 - R 0.87, L 0.74.  [name] GERD (gastroesophageal reflux disease)   . CKD (chronic kidney disease), stage III   . Breast CA (HCC)     hx  . Abdominal hernia     chronic  . STEMI (ST elevation myocardial infarction) ([place])   . Cellulitis   . CHF (congestive heart failure) ([place])   . CAD (coronary artery disease)     a. 10/2012 - presented w/ STEMI felt due to Takotsubo - mild nonobstructive dz by cath.    PAST SURGICAL HISTORY:   Past Surgical History  Procedure Laterality Date  . Mastectomy    . Cataract extraction    . Esophagogastroduodenoscopy N/A 11/05/2012    Procedure: ESOPHAGOGASTRODUODENOSCOPY (EGD) with esophageal savary dilatation;  Surgeon: Missy Sabins, MD;  Location: [facility];  Service: Endoscopy;  Laterality: N/A;  savary dil...no floro needed  . Bladder surgery    . Left heart catheterization with coronary angiogram N/A 10/24/2012    Procedure: LEFT HEART CATHETERIZATION WITH CORONARY ANGIOGRAM;  Surgeon: Sherren Mocha, MD;  Location: [facility] CATH LAB;  Service: Cardiovascular;  Laterality: N/A;  . Peripheral vascular catheterization N/A 02/07/2015    Procedure: Abdominal  Aortogram w/Lower Extremity;  Surgeon: Katha Cabal, MD;  Location: [facility] CV LAB;  Service: Cardiovascular;  Laterality: N/A;  . Peripheral vascular catheterization  02/07/2015    Procedure: Lower Extremity Intervention;  Surgeon: Katha Cabal, MD;  Location: [facility] CV LAB;  Service: Cardiovascular;;  . Coronary angioplasty    . Esophagogastroduodenoscopy (egd) with propofol N/A 05/23/2015    Procedure: ESOPHAGOGASTRODUODENOSCOPY (EGD) WITH PROPOFOL;  Surgeon: Lucilla Lame, MD;  Location: [facility] ENDOSCOPY;  Service: Endoscopy;  Laterality: N/A;    SOCIAL HISTORY:   Social History  Substance Use Topics  . Smoking status: Former Smoker -- 1.00 packs/day for 55 years     Types: Cigarettes  . Smokeless tobacco: Former Systems developer  . Alcohol Use: No    FAMILY HISTORY:   Family History  Problem Relation Age of Onset  . Heart attack Mother   . Stroke Mother   . COPD Son   . Pneumonia Father     DRUG ALLERGIES:   Allergies  Allergen Reactions  . Penicillins Itching, Swelling, Rash and Other (See Comments)    Pt is unable to answer additional questions about this medication because it happened so long ago.  [name] [Levofloxacin In D5w]   . Sulfa Antibiotics Other (See Comments)    Reaction:  Unknown     REVIEW OF SYSTEMS:  CONSTITUTIONAL: Positive for fever, chills, sweats and weakness. Weight stable EYES: No blurred or double vision. Wears glasses EARS, NOSE, AND THROAT: No tinnitus or ear pain. Positive for runny nose and scratchy throat. RESPIRATORY: Positive for cough and shortness of breath. No wheezing or hemoptysis.  CARDIOVASCULAR: Positive for chest pain and edema.  GASTROINTESTINAL: No nausea, vomiting, diarrhea or abdominal pain. No blood in bowel movements. occasional constipation. GENITOURINARY: No dysuria, hematuria.  ENDOCRINE: No polyuria, nocturia,  HEMATOLOGY: No anemia, easy bruising or bleeding SKIN: Positive for lesions onand neck MUSCULOSKELETAL: No joint pain or arthritis.   NEUROLOGIC: No tingling, numbness, weakness.  PSYCHIATRY: No anxiety or depression.   MEDICATIONS AT HOME:   Prior to Admission medications   Medication Sig Start Date End Date Taking? Authorizing Provider  albuterol (PROVENTIL HFA;VENTOLIN HFA) 108 (90 BASE) MCG/ACT inhaler Inhale 2 puffs into the lungs every 4 (four) hours as needed for wheezing or shortness of breath.   Yes Historical Provider, MD  albuterol (PROVENTIL) (2.5 MG/3ML) 0.083% nebulizer solution Take 2.5 mg by nebulization 4 (four) times daily as needed for wheezing or shortness of breath.   Yes Historical Provider, MD  carvedilol (COREG) 6.25 MG tablet Take 6.25 mg by mouth 2 (two)  times daily with a meal.   Yes Historical Provider, MD  Fluticasone-Salmeterol (ADVAIR) 250-50 MCG/DOSE AEPB Inhale 1 puff into the lungs 2 (two) times daily.   Yes Historical Provider, MD  furosemide (LASIX) 40 MG tablet Take 80 mg by mouth 2 (two) times daily.    Yes Historical Provider, MD  loratadine (CLARITIN) 10 MG tablet Take 1 tablet (10 mg total) by mouth daily. 05/29/15  Yes Arlis Porta., MD  Multiple Vitamin (MULTIVITAMIN WITH MINERALS) TABS tablet Take 1 tablet by mouth daily.   Yes Historical Provider, MD  omeprazole (PRILOSEC) 20 MG capsule Take 1 capsule (20 mg total) by mouth daily. 12/21/14  Yes Arlis Porta., MD  pyridOXINE (VITAMIN B-6) 100 MG tablet Take 100 mg by mouth daily.   Yes Historical Provider, MD  ranitidine (ZANTAC) 150 MG tablet Take 150 mg by mouth at bedtime.  04/27/15  Yes Historical Provider, MD  Rivaroxaban (XARELTO) 15 MG TABS tablet Take 15 mg by mouth daily.    Yes Historical Provider, MD  tiotropium (SPIRIVA) 18 MCG inhalation capsule Place 18 mcg into inhaler and inhale daily.   Yes Historical Provider, MD  vitamin B-12 (CYANOCOBALAMIN) 1000 MCG tablet Take 1,000 mcg by mouth daily.   Yes Historical Provider, MD      VITAL SIGNS:  Blood pressure 133/82, pulse 95, temperature 99.4 F (37.4 C), temperature source Oral, resp. rate 30, height 5\' 6"  (1.676 m), weight 72.122 kg (159 lb), SpO2 88 %.  PHYSICAL EXAMINATION:  GENERAL:  82 y.o.-year-old patient lying in the bed with no acute distress.  EYES: Pupils equal, round, reactive to light and accommodation. No scleral icterus. Extraocular muscles intact.  HEENT: Head atraumatic, normocephalic. Oropharynx and nasopharynx clear.  NECK:  Supple, no jugular venous distention. No thyroid enlargement, no tenderness.  LUNGS: Decreased breath sounds bilaterally, slight expiratory upper airway wheezing. No rales,rhonchi or crepitation. No use of accessory muscles of respiration.  CARDIOVASCULAR: S1,  S2 irregularly irregular. No murmurs, rubs, or gallops.  ABDOMEN: Soft, nontender, nondistended. Bowel sounds present. No organomegaly or mass.  EXTREMITIES: 2+ edema. No cyanosis, or clubbing.  NEUROLOGIC: Cranial nerves II through XII are intact. Muscle strength 5/5 in all extremities. Sensation intact. Gait not checked.  PSYCHIATRIC: The patient is alert and oriented x 3.  SKIN: No rash, lesion, or ulcer.   LABORATORY PANEL:   CBC  Recent Labs Lab 06/27/15 0813  WBC 7.5  HGB 12.7  HCT 39.1  PLT 153   ------------------------------------------------------------------------------------------------------------------  Chemistries   Recent Labs Lab 06/27/15 0813  NA 133*  K 3.8  CL 88*  CO2 36*  GLUCOSE 169*  BUN 15  CREATININE 1.11*  CALCIUM 8.7*   ------------------------------------------------------------------------------------------------------------------  Cardiac Enzymes  Recent Labs Lab 06/27/15 0813  TROPONINI 0.03   ------------------------------------------------------------------------------------------------------------------  RADIOLOGY:  Dg Chest 1 View  06/27/2015  CLINICAL DATA:  Shortness of breath and cough EXAM: CHEST 1 VIEW COMPARISON:  04/13/2015 FINDINGS: Hyperinflation and interstitial coarsening correlating with COPD and apical scarring. No cardiomegaly for technique. Left hilar superior retraction related to scarring. Stable mediastinal contours. There is no edema, consolidation, effusion, or pneumothorax. IMPRESSION: 1. No acute finding. 2. COPD and left apical scarring. Electronically Signed   By: Monte Fantasia M.D.   On: 06/27/2015 08:32    EKG:   Atrial fibrillation 91 bpm Q waves septally  IMPRESSION AND PLAN:   1. COPD exacerbation. Chronic respiratory failure on 2-1/2-3 L of oxygen. Start IV Solu-Medrol, nebulizer treatments, doxycycline, budesonide nebulizer. Continue oxygen supplementation. 2. Atrial fibrillation-  anticoagulation with Xarelto and rate control with Coreg 3. History of diastolic CHF- continue Coreg and oral Lasix. I don't think the patient's in congestive heart failure at this time. Last echo shows EF of 60-65% with increased pulmonary arterial systolic pressure. 4. History of peripheral vascular disease and CAD- patient on anticoagulation and coronary 5. History of breast cancer in the past 6. History of esophageal stretching procedures in the past on H2 blocker  All the records are reviewed and case discussed with ED provider. Management plans discussed with the patient, family and they are in agreement.  CODE STATUS: Full code  TOTAL TIME TAKING CARE OF THIS PATIENT: 55 minutes.    Loletha Grayer M.D on 06/27/2015 at 11:18 AM  Between 7am to 6pm - Pager - [phone]  After 6pm call admission pager [phone]  [facility]  Hospitalists  Office  [phone]  CC: Primary care physician; [name], MD

## 2015-06-27 NOTE — ED Notes (Signed)
Family at bedside. 

## 2015-06-27 NOTE — Progress Notes (Signed)
Patient  2nd trop. Result was 0.14 DR Leslye Peer was made aware no new order at this time, will continue to monitor

## 2015-06-27 NOTE — ED Provider Notes (Signed)
[facility] Emergency Department Provider Note  ____________________________________________  Time seen: Seen upon arrival to the emergency department  I have reviewed the triage vital signs and the nursing notes.   HISTORY  Chief Complaint Chest Pain; Shortness of Breath; and Cough    HPI [name] [name] is a 82 y.o. female with a history of COPD on home oxygen who is presenting today with shortness of breath and cough which is worsening over the past 3 days. She denies any fever but did say that she had cold chills this past Sunday. She has not had any productive cough. No known sick contacts. She is not a smoker. She has not been admitted to the hospital for 6 months but has had multiple admissions for COPD exacerbation.She has a known history of atrial fibrillation on Xarelto. Also had an episode of chest pressure this morning which lasted several minutes and was left-sided, nonradiating. Was not associated with when she was coughing. She says that she occasionally gets chest pain and has a history of cardiac disease but this episode lasted for several minutes longer than normal. Past Medical History  Diagnosis Date  . HTN (hypertension)   . COPD (chronic obstructive pulmonary disease) (HCC)     a. on home O2 nightly.  . Diverticulosis   . Takotsubo cardiomyopathy 10/2012    a. 10/2012: presented w/ cardiogenic shock/systolic CHF/VDRF - EF 123456 by cath, 40% by f/u echo - cath with mild nonobstructive CAD. b. Not discharged on ACEI due to hypotension.  . Cardiogenic shock ([place])     a. 10/2012 - due to Takotsubo CM.  [name] Respiratory failure ([place])     a. On home O2 nightly. b. VDRF 10/2012 - due to CAP/pulm edema in setting of cardiogenic shock/Takotsubo CM.  [name] CAP (community acquired pneumonia)     a. 10/2012 - will need f/u CXR in June 2014 to ensure clearance.  . Esophageal stricture     a. s/p dilitation 10/2012.  [name] Pleural effusion, right     a. s/p thoracentesis  10/28/12.  . Atrial fibrillation ([place])     a. Dx 10/2012, rate controlled, started on Xarelto.  . Claudication ([place])     a. ABI 10/2012 - R 0.87, L 0.74.  [name] GERD (gastroesophageal reflux disease)   . CKD (chronic kidney disease), stage III   . Breast CA (HCC)     hx  . Abdominal hernia     chronic  . STEMI (ST elevation myocardial infarction) ([place])   . Cellulitis   . CHF (congestive heart failure) ([place])   . CAD (coronary artery disease)     a. 10/2012 - presented w/ STEMI felt due to Takotsubo - mild nonobstructive dz by cath.    Patient Active Problem List   Diagnosis Date Noted  . Problems with swallowing and mastication   . Hiatal hernia   . Stricture and stenosis of esophagus   . Respiratory failure with hypoxia ([place]) 04/10/2015  . Diverticulitis large intestine 03/28/2015  . Acute on chronic respiratory failure with hypoxemia ([place]) 03/03/2015  . Essential hypertension 04/08/2014  . Chronic systolic heart failure ([place]) 04/30/2013  . PAD (peripheral artery disease) ([place]) 12/07/2012  . Atrial fibrillation ([place]) 10/31/2012  . Pleural effusion 10/29/2012  . Acute systolic CHF (congestive heart failure) ([place]) 10/28/2012  . Acute respiratory failure ([place]) 10/28/2012  . Hypokalemia 10/28/2012  . Cardiogenic shock ([place]) 10/28/2012  . GERD (gastroesophageal reflux disease) 10/28/2012  . Esophageal stricture 10/28/2012  .  Community acquired pneumonia 10/26/2012  . Takotsubo syndrome 10/25/2012    Past Surgical History  Procedure Laterality Date  . Mastectomy    . Cataract extraction    . Esophagogastroduodenoscopy N/A 11/05/2012    Procedure: ESOPHAGOGASTRODUODENOSCOPY (EGD) with esophageal savary dilatation;  Surgeon: Missy Sabins, MD;  Location: [place];  Service: Endoscopy;  Laterality: N/A;  savary dil...no floro needed  . Bladder surgery    . Left heart catheterization with coronary angiogram N/A 10/24/2012    Procedure: LEFT HEART CATHETERIZATION WITH CORONARY  ANGIOGRAM;  Surgeon: Sherren Mocha, MD;  Location: [facility] CATH LAB;  Service: Cardiovascular;  Laterality: N/A;  . Peripheral vascular catheterization N/A 02/07/2015    Procedure: Abdominal Aortogram w/Lower Extremity;  Surgeon: Katha Cabal, MD;  Location: [place] CV LAB;  Service: Cardiovascular;  Laterality: N/A;  . Peripheral vascular catheterization  02/07/2015    Procedure: Lower Extremity Intervention;  Surgeon: Katha Cabal, MD;  Location: [place] CV LAB;  Service: Cardiovascular;;  . Coronary angioplasty    . Esophagogastroduodenoscopy (egd) with propofol N/A 05/23/2015    Procedure: ESOPHAGOGASTRODUODENOSCOPY (EGD) WITH PROPOFOL;  Surgeon: Lucilla Lame, MD;  Location: [facility] ENDOSCOPY;  Service: Endoscopy;  Laterality: N/A;    Current Outpatient Rx  Name  Route  Sig  Dispense  Refill  . albuterol (PROVENTIL HFA;VENTOLIN HFA) 108 (90 BASE) MCG/ACT inhaler   Inhalation   Inhale 2 puffs into the lungs every 4 (four) hours as needed for wheezing or shortness of breath.         [name] albuterol (PROVENTIL) (2.5 MG/3ML) 0.083% nebulizer solution   Nebulization   Take 2.5 mg by nebulization 4 (four) times daily as needed for wheezing or shortness of breath.         . carvedilol (COREG) 6.25 MG tablet   Oral   Take 6.25 mg by mouth 2 (two) times daily with a meal.         . Fluticasone-Salmeterol (ADVAIR) 250-50 MCG/DOSE AEPB   Inhalation   Inhale 1 puff into the lungs 2 (two) times daily.         . furosemide (LASIX) 40 MG tablet   Oral   Take 80 mg by mouth 2 (two) times daily.          [name] loratadine (CLARITIN) 10 MG tablet   Oral   Take 1 tablet (10 mg total) by mouth daily.   30 tablet   0   . Multiple Vitamin (MULTIVITAMIN WITH MINERALS) TABS tablet   Oral   Take 1 tablet by mouth daily.         [name] omeprazole (PRILOSEC) 20 MG capsule   Oral   Take 1 capsule (20 mg total) by mouth daily.   30 capsule   11   . pyridOXINE (VITAMIN B-6) 100 MG  tablet   Oral   Take 100 mg by mouth daily.         . ranitidine (ZANTAC) 150 MG tablet   Oral   Take 150 mg by mouth at bedtime.          . Rivaroxaban (XARELTO) 15 MG TABS tablet   Oral   Take 15 mg by mouth daily.          [name] tiotropium (SPIRIVA) 18 MCG inhalation capsule   Inhalation   Place 18 mcg into inhaler and inhale daily.         . vitamin B-12 (CYANOCOBALAMIN) 1000 MCG tablet   Oral   Take  1,000 mcg by mouth daily.           Allergies Penicillins; Levaquin; and Sulfa antibiotics  Family History  Problem Relation Age of Onset  . Heart attack Mother   . COPD Son     Social History Social History  Substance Use Topics  . Smoking status: Former Smoker -- 1.00 packs/day for 55 years    Types: Cigarettes  . Smokeless tobacco: Former Systems developer  . Alcohol Use: No    Review of Systems Constitutional: chills Eyes: No visual changes. ENT: No sore throat. Cardiovascular: As above  Respiratory: As above  Gastrointestinal: No abdominal pain.  No nausea, no vomiting.  No diarrhea.  No constipation. Genitourinary: Negative for dysuria. Musculoskeletal: Negative for back pain. Skin: Negative for rash. Neurological: Negative for headaches, focal weakness or numbness.  10-point ROS otherwise negative.  ____________________________________________   PHYSICAL EXAM:  VITAL SIGNS: ED Triage Vitals  Enc Vitals Group     BP 06/27/15 0756 162/74 mmHg     Pulse Rate 06/27/15 0756 94     Resp 06/27/15 0756 26     Temp 06/27/15 0756 99.4 F (37.4 C)     Temp Source 06/27/15 0756 Oral     SpO2 06/27/15 0756 93 %     Weight 06/27/15 0756 159 lb (72.122 kg)     Height 06/27/15 0756 5\' 6"  (1.676 m)     Head Cir --      Peak Flow --      Pain Score --      Pain Loc --      Pain Edu? --      Excl. in [place]? --     Constitutional: Alert and oriented. Well appearing and in no acute distress. Eyes: Conjunctivae are normal. PERRL. EOMI. Head: Atraumatic. Nose:  No congestion/rhinnorhea. Mouth/Throat: Mucous membranes are moist.  Oropharynx non-erythematous. Neck: No stridor.   Cardiovascular: Normal rate, irregularly irregular rhythm. Grossly normal heart sounds.  Good peripheral circulation. Respiratory: Increased work of breathing with supraclavicular retractions. She is able to speak in full sentences but must pause in between sentences to catch her breath. She has decreased air movement throughout with coarse wheezing and rhonchi. There is a prolonged expiratory phase.  Gastrointestinal: Soft and nontender. No distention. No abdominal bruits. No CVA tenderness. Musculoskeletal: No lower extremity tenderness nor edema.  No joint effusions. Neurologic:  Normal speech and language. No gross focal neurologic deficits are appreciated. No gait instability. Skin:  Skin is warm, dry and intact. No rash noted. Psychiatric: Mood and affect are normal. Speech and behavior are normal.  ____________________________________________   LABS (all labs ordered are listed, but only abnormal results are displayed)  Labs Reviewed  CBC WITH DIFFERENTIAL/PLATELET - Abnormal; Notable for the following:    Lymphs Abs 0.9 (*)    All other components within normal limits  BASIC METABOLIC PANEL - Abnormal; Notable for the following:    Sodium 133 (*)    Chloride 88 (*)    CO2 36 (*)    Glucose, Bld 169 (*)    Creatinine, Ser 1.11 (*)    Calcium 8.7 (*)    GFR calc non Af Amer 42 (*)    GFR calc Af Amer 49 (*)    All other components within normal limits  TROPONIN I   ____________________________________________  EKG  ED ECG REPORT I, Doran Stabler, the attending physician, personally viewed and interpreted this ECG.   Date: 06/27/2015  EKG Time: 753  Rate: 91  Rhythm: atrial fibrillation, rate 91  Axis: Normal axis  Intervals:none  ST&T Change: T-wave inversion in aVL as well as V2. There is no abnormal ST elevation or  depression.  ____________________________________________  RADIOLOGY  COPD without any consolidation. ____________________________________________   PROCEDURES   ____________________________________________   INITIAL IMPRESSION / ASSESSMENT AND PLAN / ED COURSE  Pertinent labs & imaging results that were available during my care of the patient were reviewed by me and considered in my medical decision making (see chart for details).  ----------------------------------------- 10:23 AM on 06/27/2015 -----------------------------------------  Patient still with some respiratory distress with mild retractions. Effort is decreased since before the nebulizer treatments. However, on 3 L nasal cannula patient is now 86% and still with a prolonged recovery phase and decreased air movement. I ordered her 3 more albuterol nebs. She will be admitted to the hospital. Signed out to Dr. Earleen Newport. Patient understands plan and willing to comply. ___________________________________   FINAL CLINICAL IMPRESSION(S) / ED DIAGNOSES  COPD exacerbation. Hypoxia.    Orbie Pyo, MD 06/27/15 1024

## 2015-06-27 NOTE — Consult Note (Signed)
   [facility] CM Inpatient Consult   06/27/2015  [name] [birth year] [id number]   Spoke with patient at bedside regarding the resuming of services at discharge with [facility], patient will be referred for community care management services which will include transition of care calls and the  patient will be evaluated for a home visit. Patient verbalized her continued  interest in [facility] services. Patient already has a signed consent form in EPIC. Mrs.Azaleah expressed appreciation of the follow up she has had with her community care coordinator Janci Minor, RN.    Of note, [facility] Care Management services does not replace or interfere with any services that are arranged by inpatient case management or social work. For questions, please contact:  Joylene Draft, RN, [place] [facility]/Hospital Liaison [phone]- Mobile [phone]- [place]

## 2015-06-28 LAB — BASIC METABOLIC PANEL
ANION GAP: 9 (ref 5–15)
BUN: 24 mg/dL — ABNORMAL HIGH (ref 6–20)
CALCIUM: 8.5 mg/dL — AB (ref 8.9–10.3)
CHLORIDE: 93 mmol/L — AB (ref 101–111)
CO2: 35 mmol/L — AB (ref 22–32)
Creatinine, Ser: 1.19 mg/dL — ABNORMAL HIGH (ref 0.44–1.00)
GFR, EST AFRICAN AMERICAN: 45 mL/min — AB (ref >60)
GFR, EST NON AFRICAN AMERICAN: 39 mL/min — AB (ref >60)
Glucose, Bld: 260 mg/dL — ABNORMAL HIGH (ref 65–99)
POTASSIUM: 4.1 mmol/L (ref 3.5–5.1)
SODIUM: 137 mmol/L (ref 135–145)

## 2015-06-28 LAB — CBC
HCT: 37.8 % (ref 35.0–47.0)
Hemoglobin: 12.4 g/dL (ref 12.0–16.0)
MCH: 31.4 pg (ref 26.0–34.0)
MCHC: 32.7 g/dL (ref 32.0–36.0)
MCV: 95.9 fL (ref 80.0–100.0)
PLATELETS: 161 10*3/uL (ref 150–440)
RBC: 3.94 MIL/uL (ref 3.80–5.20)
RDW: 14.7 % — AB (ref 11.5–14.5)
WBC: 10.9 10*3/uL (ref 3.6–11.0)

## 2015-06-28 MED ORDER — METHYLPREDNISOLONE SODIUM SUCC 40 MG IJ SOLR
40.0000 mg | Freq: Two times a day (BID) | INTRAMUSCULAR | Status: DC
  Administered 2015-06-28 – 2015-06-29 (×2): 40 mg via INTRAVENOUS
  Filled 2015-06-28 (×3): qty 1

## 2015-06-28 MED ORDER — DOXYCYCLINE HYCLATE 100 MG PO TABS
100.0000 mg | ORAL_TABLET | Freq: Two times a day (BID) | ORAL | Status: DC
  Administered 2015-06-28 – 2015-06-30 (×4): 100 mg via ORAL
  Filled 2015-06-28 (×4): qty 1

## 2015-06-28 MED ORDER — OSELTAMIVIR PHOSPHATE 30 MG PO CAPS
30.0000 mg | ORAL_CAPSULE | Freq: Two times a day (BID) | ORAL | Status: DC
  Administered 2015-06-28 – 2015-06-30 (×5): 30 mg via ORAL
  Filled 2015-06-28 (×7): qty 1

## 2015-06-28 MED ORDER — RIVAROXABAN 15 MG PO TABS
15.0000 mg | ORAL_TABLET | Freq: Every day | ORAL | Status: DC
  Administered 2015-06-28 – 2015-06-29 (×2): 15 mg via ORAL
  Filled 2015-06-28: qty 1

## 2015-06-28 NOTE — Progress Notes (Signed)
Pharmacist - Prescriber Communication Update  Oseltamivir has been decreased to 30 mg po Q12H for creatinine clearance 30 to 60 mL/min. Please call pharmacy if you have any questions about this renal dose adjustment.   Lorrane Mccay A. [place], [place].D., BCPS Clinical Pharmacist 06/28/15 [phone]

## 2015-06-28 NOTE — Progress Notes (Addendum)
Patient alert and oriented, no complaints of pain.  On chronic oxygen.  On droplet precautions.  AFIB on monitor, but now d/c telemetry.  PT eval still pending, patient able to ambulate with standby assist to [facility] and chair.  Possible D/C tomorrow?

## 2015-06-28 NOTE — Progress Notes (Signed)
Inpatient Diabetes Program Recommendations  AACE/ADA: New Consensus Statement on Inpatient Glycemic Control (2015)  Target Ranges:  Prepandial:   less than 140 mg/dL      [facility] postprandial:   less than 180 mg/dL (1-2 hours)      Critically ill patients:  140 - 180 mg/dL  Results for NEMESIS, KOVALSKY (MRN [id number]) as of 06/28/2015 12:29  Ref. Range 06/27/2015 08:13 06/28/2015 05:53  Glucose Latest Ref Range: 65-99 mg/dL 169 (H) 260 (H)   Review of Glycemic Control  Diabetes history: No Current orders for Inpatient glycemic control: None  Inpatient Diabetes Program Recommendations: Correction (SSI): Lab glucose 260 mg/dl this morning. While inpatient and ordered steorids, please consider ordering CBGs with Novolog correction scale. HgbA1C: Last A1C in the chart was 6.9% on 03/16/2014 which meets criteria for DM dx per ADA criteria. However, patient does not have a documented history of diabetes. Initial glucose before steroids were given was 169 mg/dl on 06/27/15 at 8:13 am. Please order an A1C to evaluate glycemic control over the past 2-3 months.  Thanks, Barnie Alderman, RN, MSN, CDE Diabetes Coordinator Inpatient Diabetes Program [phone] (Team Pager from [place] to [place]) [phone] ([facility] office) [phone] [facility] office) [phone] [facility] office)

## 2015-06-28 NOTE — Progress Notes (Signed)
[place] at [place] NAME: [name] [name]    MR#:  [id number]  DATE OF BIRTH:  01/05/1923  SUBJECTIVE:   Patient here due to shortness of breath and noted to be in COPD exacerbation. Still feels very weak but shortness of breath and wheezing is improved.  REVIEW OF SYSTEMS:    Review of Systems  Constitutional: Negative for fever and chills.  HENT: Negative for congestion and tinnitus.   Eyes: Negative for blurred vision and double vision.  Respiratory: Negative for cough, shortness of breath and wheezing.   Cardiovascular: Negative for chest pain, orthopnea and PND.  Gastrointestinal: Negative for nausea, vomiting, abdominal pain and diarrhea.  Genitourinary: Negative for dysuria and hematuria.  Neurological: Positive for weakness. Negative for dizziness, sensory change and focal weakness.  All other systems reviewed and are negative.   Nutrition: Heart healthy Tolerating Diet: Yes Tolerating PT: Await evaluation   DRUG ALLERGIES:   Allergies  Allergen Reactions  . Penicillins Itching, Swelling, Rash and Other (See Comments)    Pt is unable to answer additional questions about this medication because it happened so long ago.  [name] [Levofloxacin In D5w]   . Sulfa Antibiotics Other (See Comments)    Reaction:  Unknown     VITALS:  Blood pressure 113/52, pulse 69, temperature 97.7 F (36.5 C), temperature source Oral, resp. rate 18, height 5\' 6"  (1.676 m), weight 73.165 kg (161 lb 4.8 oz), SpO2 94 %.  PHYSICAL EXAMINATION:   Physical Exam  GENERAL:  82 y.o.-year-old patient lying in the bed with no acute distress.  EYES: Pupils equal, round, reactive to light and accommodation. No scleral icterus. Extraocular muscles intact.  HEENT: Head atraumatic, normocephalic. Oropharynx and nasopharynx clear.  NECK:  Supple, no jugular venous distention. No thyroid enlargement, no tenderness.  LUNGS: Prolonged inspiratory and  expiratory phase, no wheezing, rales, rhonchi. No use of accessory muscles of respiration.  CARDIOVASCULAR: S1, S2 normal. No murmurs, rubs, or gallops.  ABDOMEN: Soft, nontender, nondistended. Bowel sounds present. No organomegaly or mass.  EXTREMITIES: No cyanosis, clubbing or edema b/l.    NEUROLOGIC: Cranial nerves II through XII are intact. No focal Motor or sensory deficits b/l.  Globally weak PSYCHIATRIC: The patient is alert and oriented x 3.  SKIN: No obvious rash, lesion, or ulcer.    LABORATORY PANEL:   CBC  Recent Labs Lab 06/28/15 0553  WBC 10.9  HGB 12.4  HCT 37.8  PLT 161   ------------------------------------------------------------------------------------------------------------------  Chemistries   Recent Labs Lab 06/28/15 0553  NA 137  K 4.1  CL 93*  CO2 35*  GLUCOSE 260*  BUN 24*  CREATININE 1.19*  CALCIUM 8.5*   ------------------------------------------------------------------------------------------------------------------  Cardiac Enzymes  Recent Labs Lab 06/27/15 1742  TROPONINI 0.14*   ------------------------------------------------------------------------------------------------------------------  RADIOLOGY:  Dg Chest 1 View  06/27/2015  CLINICAL DATA:  Shortness of breath and cough EXAM: CHEST 1 VIEW COMPARISON:  04/13/2015 FINDINGS: Hyperinflation and interstitial coarsening correlating with COPD and apical scarring. No cardiomegaly for technique. Left hilar superior retraction related to scarring. Stable mediastinal contours. There is no edema, consolidation, effusion, or pneumothorax. IMPRESSION: 1. No acute finding. 2. COPD and left apical scarring. Electronically Signed   By: Monte Fantasia M.D.   On: 06/27/2015 08:32     ASSESSMENT AND PLAN:   82 year old female with past medical history of COPD, hip attention, Takasubo cardiomyopathy, history of esophageal stricture, chronic a. Fib,  GERD, chronic kidney disease stage  III,  history of breast cancer, history of CHF who presented to the hospital due to shortness of breath and noted to be in COPD exacerbation.  #1 COPD exacerbation-clinically improved since yesterday. Continue IV steroids but will taper. -Continue budesonide nebs, Spiriva, albuterol nebs -Patient is already on oxygen at home.  #2 history of CHF-clinically patient was not in congestive heart failure. -Continue Lasix, Coreg.  #3 influenza-patient was positive for influenza A PCR.  -continue Tamiflu.  Clinically although has not had any flu symptoms.  #4 GERD-continue Protonix.  #5 history of chronic afibrillation-continue Coreg. Continue Xarelto.  Likely DC home tomorrow. Await physical therapy evaluation.  All the records are reviewed and case discussed with Care Management/Social Workerr. Management plans discussed with the patient, family and they are in agreement.  CODE STATUS: Full  DVT Prophylaxis: Xarelto  TOTAL TIME TAKING CARE OF THIS PATIENT: 30 minutes.   POSSIBLE D/C IN 1-2 DAYS, DEPENDING ON CLINICAL CONDITION.   Henreitta Leber M.D on 06/28/2015 at 3:50 PM  Between 7am to 6pm - Pager - [phone]  After 6pm go to [email or web address] - password [email or web address] [place] [facility]  Office  [phone]  CC: Primary care physician; [name], MD

## 2015-06-29 MED ORDER — ZOLPIDEM TARTRATE 5 MG PO TABS
5.0000 mg | ORAL_TABLET | Freq: Every evening | ORAL | Status: DC | PRN
  Administered 2015-06-29: 22:00:00 5 mg via ORAL
  Filled 2015-06-29: qty 1

## 2015-06-29 MED ORDER — DOXYCYCLINE HYCLATE 100 MG PO TABS: 100.0000 mg | ORAL_TABLET | Freq: Two times a day (BID) | ORAL | Status: AC

## 2015-06-29 MED ORDER — OSELTAMIVIR PHOSPHATE 30 MG PO CAPS: 30.0000 mg | ORAL_CAPSULE | Freq: Two times a day (BID) | ORAL | Status: DC

## 2015-06-29 MED ORDER — METHYLPREDNISOLONE SODIUM SUCC 40 MG IJ SOLR
40.0000 mg | Freq: Every day | INTRAMUSCULAR | Status: DC
  Filled 2015-06-29: qty 1

## 2015-06-29 MED ORDER — PREDNISONE 10 MG PO TABS: ORAL_TABLET | ORAL | Status: DC

## 2015-06-29 NOTE — Clinical Social Work Note (Signed)
[facility] retracted bed offer due to flu dx. CSW updated pt and provided additional bed offers. Pt chose [facility]. Facility is able to accept pt tomorrow (06/30/2015) when they will be able to obtain medications. MD updated as well as pt's daughter, [name]. CSW will continue to follow to facility transfer.   Darden Dates, MSW, LCSW  Clinical Social Worker  [phone]

## 2015-06-29 NOTE — Progress Notes (Signed)
Spoke with Legrand Como, [facility] rep at [phone], to notify of non-emergent EMS transport.  Auth notification reference given as [id number].   Service date range good from 06/29/15 - 09/27/15.   Gap exception requested to determine if services can be considered at an in-network level.

## 2015-06-29 NOTE — Clinical Social Work Placement (Signed)
   CLINICAL SOCIAL WORK PLACEMENT  NOTE  Date:  06/29/2015  Patient Details  Name: [name] [name] MRN: [id number] Date of Birth: 08-15-1922  Clinical Social Work is seeking post-discharge placement for this patient at the [place] level of care (*CSW will initial, date and re-position this form in  chart as items are completed):  Yes   Patient/family provided with [place] Work Department's list of facilities offering this level of care within the geographic area requested by the patient (or if unable, by the patient's family).  Yes   Patient/family informed of their freedom to choose among providers that offer the needed level of care, that participate in Medicare, Medicaid or managed care program needed by the patient, have an available bed and are willing to accept the patient.  Yes   Patient/family informed of [facility] ownership interest in [facility] and [facility], as well as of the fact that they are under no obligation to receive care at these facilities.  PASRR submitted to EDS on       PASRR number received on       Existing PASRR number confirmed on 06/29/15     FL2 transmitted to all facilities in geographic area requested by pt/family on 06/29/15     FL2 transmitted to all facilities within larger geographic area on       Patient informed that his/her managed care company has contracts with or will negotiate with certain facilities, including the following:        Yes   Patient/family informed of bed offers received.  Patient [facility] bed at [facility]     Physician recommends and patient chooses bed at  [facility])    Patient to be transferred to [facility] [place] on 06/29/15.  Patient to be transferred to facility by [facility]     Patient family notified on 06/29/15 of transfer.  Name of family member notified:  Pt declined, she will reach out to family.      PHYSICIAN       Additional  Comment:    _______________________________________________ Darden Dates, LCSW 06/29/2015, 2:13 PM

## 2015-06-29 NOTE — Plan of Care (Signed)
Problem: Education: Goal: Knowledge of [place] [facility] Education information/materials will improve Outcome: Progressing Pt oriented to unit and how to contact RN and NA   Problem: Safety: Goal: Ability to remain free from injury will improve Outcome: Progressing Bed alarm used at night to remind pt to call for assistance when getting up.  Pt free from injury this shift.  Problem: Health Behavior/Discharge Planning: Goal: Ability to manage health-related needs will improve Outcome: Progressing Discussed going to rehab for physical therapy until able to go home and care for self.  Pt agreeable to plan.  Problem: Activity: Goal: Risk for activity intolerance will decrease Outcome: Progressing Pt ambulating with stand by assist to bathroom

## 2015-06-29 NOTE — Clinical Social Work Note (Signed)
Clinical Social Work Assessment  Patient Details  Name: [name] [name] MRN: [id number] Date of Birth: 1922/06/17  Date of referral:  06/28/15               Reason for consult:  Discharge Planning                Permission sought to share information with:  Family Supports, Facility Sport and exercise psychologist Permission granted to share information::  Yes, Verbal Permission Granted  Name::      Quentin Strebel [phone]) & Aram Candela [phone]) )  Agency::   ([facility] )  Relationship::     Contact Information:     Housing/Transportation Living arrangements for the past 2 months:  Single Family Home Source of Information:  Patient Patient Interpreter Needed:  None Criminal Activity/Legal Involvement Pertinent to Current Situation/Hospitalization:  No - Comment as needed Significant Relationships:  Adult Children, Other Family Members, Friend Lives with:  Self Do you feel safe going back to the place where you live?  Yes Need for family participation in patient care:  Yes (Comment) Levander Campion Marrin [phone]) & Aram Candela [phone]))  Care giving concerns:  New SNF referral    Social Worker assessment / plan:  CSW was informed by PT that patient would be a candidate for SNF placement. CSW met with patient at bedside. Patient was sitting up in bed. Patient was alert and oriented. CSW explained her role. Patient reports that she currently lives alone. She stated that she went to rehab in November 2016 but was released by Thanksgiving. Per patient she went to [facility] and wants to return for SNF placement. She reports that she does not use any DME or O2. Per patient she's "pretty independent". Patient granted CSW verbal permission to contact Amaura Authier [phone]) & Aram Candela [phone]). She also granted CSW to send referrals to SNFs in [facility] with her preference being [facility].   FL2/ PASRR complete. Patient faxed to SNFs in [facility]. CSW alerted Broadus John, admissions  coordinator at [facility] that patient is interested in placement. Awaiting bed offers.   CSW will continue to follow and assist.   Ernest Pine, MSW, [place] Work Department [phone]    Employment status:  Retired Insurance underwriter information:  Acupuncturist ) PT Recommendations:  [place] / Referral to community resources:  [place]  Patient/Family's Response to care:  Patient is in agreement to going SNF after hospitalization. Preference [facility].   Patient/Family's Understanding of and Emotional Response to Diagnosis, Current Treatment, and Prognosis:  Patient was pleasant. Per patient she understands CSW's role and is appreciative of CSW's assistance.   Emotional Assessment Appearance:  Appears younger than stated age Attitude/Demeanor/Rapport:   (None ) Affect (typically observed):  Accepting, Calm, Pleasant Orientation:  Oriented to Self, Oriented to Place, Oriented to  Time, Oriented to Situation Alcohol / Substance use:  Not Applicable Psych involvement (Current and /or in the community):  No (Comment)  Discharge Needs  Concerns to be addressed:  Discharge Planning Concerns Readmission within the last 30 days:    Current discharge risk:  Chronically ill Barriers to Discharge:  Continued Medical Work up   [employer], LCSW 06/29/2015, 7:52 AM

## 2015-06-29 NOTE — Plan of Care (Signed)
Facility called and stated they could not take pt  D/t them currently being treated for influenza.  They were declining their offer to admit pt.  Social wk states she has bed at [facility] but will not be able to go till 1/20 AM.  Dr. and pt informed.  Dr. Also gave verbal ok to leave pt with NO IV for now (since probable DC in morning).

## 2015-06-29 NOTE — Clinical Social Work Note (Signed)
Pt is ready for discharge to [facility] today. Facility has received discharge information and is ready to admit pt. Pt is aware and agreeable. Pt reported that she will follow up with her daughter, [name]. RN to call report and EMS will provide transportation. CSW is signing off as no further needs identified.   Darden Dates, MSW, LCSW Clinical Social Worker  [phone]

## 2015-06-29 NOTE — Discharge Planning (Addendum)
Pt IV removed.  Pt DC papers discussed, explained and educated.  Told of suggested FU appts and scripts sent to pharm. Report called to [facility] Yaakov Guthrie, LPN).and EMS called to transport since pt on continuous O2.  Packet sent.  RN assessment and VS reveal stability for DC to facility. Daughter also called/informed of DC POC. Awaiting EMS arrival - Pt will be in room 602.

## 2015-06-29 NOTE — Progress Notes (Signed)
[place] at [place] NAME: [name] [name]    MR#:  [id number]  DATE OF BIRTH:  July 21, 1922  SUBJECTIVE:   Shortness of breath improved. Still feels weak.  Afebrile.    REVIEW OF SYSTEMS:    Review of Systems  Constitutional: Negative for fever and chills.  HENT: Negative for congestion and tinnitus.   Eyes: Negative for blurred vision and double vision.  Respiratory: Negative for cough, shortness of breath and wheezing.   Cardiovascular: Negative for chest pain, orthopnea and PND.  Gastrointestinal: Negative for nausea, vomiting, abdominal pain and diarrhea.  Genitourinary: Negative for dysuria and hematuria.  Neurological: Positive for weakness. Negative for dizziness, sensory change and focal weakness.  All other systems reviewed and are negative.   Nutrition: Heart healthy Tolerating Diet: Yes Tolerating PT: Eval noted.    DRUG ALLERGIES:   Allergies  Allergen Reactions  . Penicillins Itching, Swelling, Rash and Other (See Comments)    Pt is unable to answer additional questions about this medication because it happened so long ago.  [name] [Levofloxacin In D5w]   . Sulfa Antibiotics Other (See Comments)    Reaction:  Unknown     VITALS:  Blood pressure 133/66, pulse 72, temperature 97.9 F (36.6 C), temperature source Oral, resp. rate 18, height 5\' 6"  (1.676 m), weight 73.165 kg (161 lb 4.8 oz), SpO2 94 %.  PHYSICAL EXAMINATION:   Physical Exam  GENERAL:  82 y.o.-year-old patient lying in the bed with no acute distress.  EYES: Pupils equal, round, reactive to light and accommodation. No scleral icterus. Extraocular muscles intact.  HEENT: Head atraumatic, normocephalic. Oropharynx and nasopharynx clear.  NECK:  Supple, no jugular venous distention. No thyroid enlargement, no tenderness.  LUNGS: Prolonged inspiratory and expiratory phase, no wheezing, rales, rhonchi. No use of accessory muscles of respiration.   CARDIOVASCULAR: S1, S2 normal. No murmurs, rubs, or gallops.  ABDOMEN: Soft, nontender, nondistended. Bowel sounds present. No organomegaly or mass.  EXTREMITIES: No cyanosis, clubbing or edema b/l.    NEUROLOGIC: Cranial nerves II through XII are intact. No focal Motor or sensory deficits b/l.  Globally weak PSYCHIATRIC: The patient is alert and oriented x 3.  SKIN: No obvious rash, lesion, or ulcer.    LABORATORY PANEL:   CBC  Recent Labs Lab 06/28/15 0553  WBC 10.9  HGB 12.4  HCT 37.8  PLT 161   ------------------------------------------------------------------------------------------------------------------  Chemistries   Recent Labs Lab 06/28/15 0553  NA 137  K 4.1  CL 93*  CO2 35*  GLUCOSE 260*  BUN 24*  CREATININE 1.19*  CALCIUM 8.5*   ------------------------------------------------------------------------------------------------------------------  Cardiac Enzymes  Recent Labs Lab 06/27/15 1742  TROPONINI 0.14*   ------------------------------------------------------------------------------------------------------------------  RADIOLOGY:  No results found.   ASSESSMENT AND PLAN:   82 year old female with past medical history of COPD, hip attention, Takasubo cardiomyopathy, history of esophageal stricture, chronic a. Fib,  GERD, chronic kidney disease stage III, history of breast cancer, history of CHF who presented to the hospital due to shortness of breath and noted to be in COPD exacerbation.  #1 COPD exacerbation-clinically improved since yesterday. Continue IV steroids but will taper further. -Continue budesonide nebs, Spiriva, albuterol nebs -Patient is already on oxygen at home.  #2 history of CHF-clinically patient was not in congestive heart failure. -Continue Lasix, Coreg.  #3 influenza-patient was positive for influenza A PCR.  -continue Tamiflu.  Clinically although has not had any flu symptoms.  #4 GERD-continue Protonix.  #  5  history of chronic afibrillation-continue Coreg. Continue Xarelto.  Likely d/c to SNF tomorrow.   All the records are reviewed and case discussed with Care Management/Social Workerr. Management plans discussed with the patient, family and they are in agreement.  CODE STATUS: Full  DVT Prophylaxis: Xarelto  TOTAL TIME TAKING CARE OF THIS PATIENT: 30 minutes.   POSSIBLE D/C IN 1-2 DAYS, DEPENDING ON CLINICAL CONDITION.   Henreitta Leber M.D on 06/29/2015 at 5:27 PM  Between 7am to 6pm - Pager - [phone]  After 6pm go to [email or web address] - password [email or web address] [place] [facility]  Office  [phone]  CC: Primary care physician; [name] Doe, MD

## 2015-06-29 NOTE — Clinical Social Work Placement (Signed)
   CLINICAL SOCIAL WORK PLACEMENT  NOTE  Date:  06/29/2015  Patient Details  Name: [name] [name] MRN: [id number] Date of Birth: 1922-12-16  Clinical Social Work is seeking post-discharge placement for this patient at the [place] level of care (*CSW will initial, date and re-position this form in  chart as items are completed):  Yes   Patient/family provided with [place] Work Department's list of facilities offering this level of care within the geographic area requested by the patient (or if unable, by the patient's family).  Yes   Patient/family informed of their freedom to choose among providers that offer the needed level of care, that participate in Medicare, Medicaid or managed care program needed by the patient, have an available bed and are willing to accept the patient.  Yes   Patient/family informed of [facility] ownership interest in [facility] and [facility], as well as of the fact that they are under no obligation to receive care at these facilities.  PASRR submitted to EDS on       PASRR number received on       Existing PASRR number confirmed on 06/29/15     FL2 transmitted to all facilities in geographic area requested by pt/family on 06/29/15     FL2 transmitted to all facilities within larger geographic area on       Patient informed that his/her managed care company has contracts with or will negotiate with certain facilities, including the following:            Patient/family informed of bed offers received.  Patient chooses bed at       Physician recommends and patient chooses bed at      Patient to be transferred to   on  .  Patient to be transferred to facility by       Patient family notified on   of transfer.  Name of family member notified:        PHYSICIAN       Additional Comment:    _______________________________________________ Baldemar Lenis, LCSW 06/29/2015, 8:08 AM

## 2015-06-29 NOTE — Care Management Important Message (Signed)
Important Message  Patient Details  Name: [name] [name] MRN: [id number] Date of Birth: 1923/03/06   Medicare Important Message Given:  Yes    Shelbie Ammons, RN 06/29/2015, 12:08 PM

## 2015-06-29 NOTE — NC FL2 (Signed)
[place] LEVEL OF CARE SCREENING TOOL     IDENTIFICATION  Patient Name: [name] [name] Birthdate: 1923/05/22 Sex: female Admission Date (Current Location): 06/27/2015  [place] and [place] Number:  Engineering geologist and Address:  [facility], [address], [place], [place] [address]      Provider Number: [id number]  Attending Physician Name and Address:  Henreitta Leber, MD  Relative Name and Phone Number:       Current Level of Care: Hospital Recommended Level of Care: [place] Prior Approval Number:    Date Approved/Denied:   PASRR Number:  ([id number])  Discharge Plan: SNF    Current Diagnoses: Patient Active Problem List   Diagnosis Date Noted  . COPD with acute exacerbation ([place]) 06/27/2015  . Problems with swallowing and mastication   . Hiatal hernia   . Stricture and stenosis of esophagus   . Respiratory failure with hypoxia ([place]) 04/10/2015  . Diverticulitis large intestine 03/28/2015  . Acute on chronic respiratory failure with hypoxemia ([place]) 03/03/2015  . Essential hypertension 04/08/2014  . Chronic systolic heart failure ([place]) 04/30/2013  . PAD (peripheral artery disease) ([place]) 12/07/2012  . Atrial fibrillation ([place]) 10/31/2012  . Pleural effusion 10/29/2012  . Acute systolic CHF (congestive heart failure) ([place]) 10/28/2012  . Acute respiratory failure ([place]) 10/28/2012  . Hypokalemia 10/28/2012  . Cardiogenic shock ([place]) 10/28/2012  . GERD (gastroesophageal reflux disease) 10/28/2012  . Esophageal stricture 10/28/2012  . Community acquired pneumonia 10/26/2012  . Takotsubo syndrome 10/25/2012    Orientation RESPIRATION BLADDER Height & Weight    Self, Time, Situation, Place  O2 (Nasal Cannula 3 (L/min)) Continent 5\' 6"  (167.6 cm) 161 lbs.  BEHAVIORAL SYMPTOMS/MOOD NEUROLOGICAL BOWEL NUTRITION STATUS   (None )  (None ) Continent Diet (2 gram sodium )  AMBULATORY STATUS COMMUNICATION OF  NEEDS Skin   Extensive Assist Verbally Normal                       Personal Care Assistance Level of Assistance  Bathing, Feeding, Dressing Bathing Assistance: Limited assistance Feeding assistance: Independent Dressing Assistance: Limited assistance     Functional Limitations Info  Sight, Hearing, Speech Sight Info: Adequate Hearing Info: Adequate Speech Info: Adequate    SPECIAL CARE FACTORS FREQUENCY  PT (By licensed PT)     PT Frequency:  (5)              Contractures      Additional Factors Info  Allergies, Code Status Code Status Info:  (Full Code ) Allergies Info:  (Penicillins, Levaquin, Sulfa Antibiotics)           Current Medications (06/29/2015):  This is the current hospital active medication list Current Facility-Administered Medications  Medication Dose Route Frequency Provider Last Rate Last Dose  . acetaminophen (TYLENOL) tablet 650 mg  650 mg Oral Q6H PRN Loletha Grayer, MD       Or  . acetaminophen (TYLENOL) suppository 650 mg  650 mg Rectal Q6H PRN Loletha Grayer, MD      . albuterol (PROVENTIL) (2.5 MG/3ML) 0.083% nebulizer solution 2.5 mg  2.5 mg Nebulization Q6H Loletha Grayer, MD   2.5 mg at 06/29/15 0235  . budesonide (PULMICORT) nebulizer solution 0.25 mg  0.25 mg Nebulization BID Loletha Grayer, MD   0.25 mg at 06/28/15 2040  . carvedilol (COREG) tablet 6.25 mg  6.25 mg Oral BID WC Loletha Grayer, MD   6.25 mg at 06/28/15 1730  .  doxycycline (VIBRA-TABS) tablet 100 mg  100 mg Oral Q12H Henreitta Leber, MD   100 mg at 06/28/15 2212  . famotidine (PEPCID) tablet 20 mg  20 mg Oral QHS Loletha Grayer, MD   20 mg at 06/28/15 2212  . furosemide (LASIX) tablet 80 mg  80 mg Oral BID Loletha Grayer, MD   80 mg at 06/28/15 1730  . loratadine (CLARITIN) tablet 10 mg  10 mg Oral Daily Loletha Grayer, MD   10 mg at 06/28/15 0936  . methylPREDNISolone sodium succinate (SOLU-MEDROL) 40 mg/mL injection 40 mg  40 mg Intravenous Q12H Henreitta Leber, MD   40 mg at 06/28/15 2213  . multivitamin with minerals tablet 1 tablet  1 tablet Oral Daily Loletha Grayer, MD   1 tablet at 06/28/15 0935  . oseltamivir (TAMIFLU) capsule 30 mg  30 mg Oral BID Henreitta Leber, MD   30 mg at 06/28/15 2240  . pantoprazole (PROTONIX) EC tablet 40 mg  40 mg Oral QAC breakfast Loletha Grayer, MD   40 mg at 06/28/15 0936  . pyridOXINE (VITAMIN B-6) tablet 100 mg  100 mg Oral Daily Loletha Grayer, MD   100 mg at 06/28/15 0936  . Rivaroxaban (XARELTO) tablet 15 mg  15 mg Oral Daily Henreitta Leber, MD   15 mg at 06/28/15 2213  . senna-docusate (Senokot-S) tablet 1 tablet  1 tablet Oral QHS PRN Loletha Grayer, MD      . sodium chloride 0.9 % injection 3 mL  3 mL Intravenous Q12H Loletha Grayer, MD   3 mL at 06/28/15 2200  . tiotropium (SPIRIVA) inhalation capsule 18 mcg  18 mcg Inhalation Daily Loletha Grayer, MD   18 mcg at 06/28/15 [phone]  . vitamin B-12 (CYANOCOBALAMIN) tablet 1,000 mcg  1,000 mcg Oral Daily Loletha Grayer, MD   1,000 mcg at 06/28/15 0935     Discharge Medications: Please see discharge summary for a list of discharge medications.  Relevant Imaging Results:  Relevant Lab Results:   Additional Information  (SSN [id number])  Lorenso Quarry Sunkins, LCSW

## 2015-06-29 NOTE — Progress Notes (Signed)
Initial Nutrition Assessment   INTERVENTION:   Meals and Snacks: Cater to patient preferences Medical Food Supplement Therapy: will recommend on follow if intake declines   NUTRITION DIAGNOSIS:   Inadequate oral intake related to acute illness as evidenced by per patient/family report.  GOAL:   Patient will meet greater than or equal to 90% of their needs  MONITOR:    (Energy Intake, Electrolyte and renal Profile, Anthropometrics)  REASON FOR ASSESSMENT:   Malnutrition Screening Tool    ASSESSMENT:   Pt admitted with COPD exacerbation and influenza A.   Past Medical History  Diagnosis Date  . HTN (hypertension)   . COPD (chronic obstructive pulmonary disease) (HCC)     a. on home O2 nightly.  . Diverticulosis   . Takotsubo cardiomyopathy 10/2012    a. 10/2012: presented w/ cardiogenic shock/systolic CHF/VDRF - EF 123456 by cath, 40% by f/u echo - cath with mild nonobstructive CAD. b. Not discharged on ACEI due to hypotension.  . Cardiogenic shock ([place])     a. 10/2012 - due to Takotsubo CM.  [name] Respiratory failure ([place])     a. On home O2 nightly. b. VDRF 10/2012 - due to CAP/pulm edema in setting of cardiogenic shock/Takotsubo CM.  [name] CAP (community acquired pneumonia)     a. 10/2012 - will need f/u CXR in June 2014 to ensure clearance.  . Esophageal stricture     a. s/p dilitation 10/2012.  [name] Pleural effusion, right     a. s/p thoracentesis 10/28/12.  . Atrial fibrillation ([place])     a. Dx 10/2012, rate controlled, started on Xarelto.  . Claudication ([place])     a. ABI 10/2012 - R 0.87, L 0.74.  [name] GERD (gastroesophageal reflux disease)   . CKD (chronic kidney disease), stage III   . Breast CA (HCC)     hx  . Abdominal hernia     chronic  . STEMI (ST elevation myocardial infarction) ([place])   . Cellulitis   . CHF (congestive heart failure) ([place])   . CAD (coronary artery disease)     a. 10/2012 - presented w/ STEMI felt due to Takotsubo - mild nonobstructive dz by cath.      Diet Order:  Diet 2 gram sodium Room service appropriate?: Yes; Fluid consistency:: Thin Diet - low sodium heart healthy    Current Nutrition: Pt ate 100% of french toast this am and tolerated well.   Food/Nutrition-Related History: Pt reports poor po intake started Sunday and then ate nothing Monday or Tuesday. Pt reports usually a good appetite.   Scheduled Medications:  . albuterol  2.5 mg Nebulization Q6H  . budesonide (PULMICORT) nebulizer solution  0.25 mg Nebulization BID  . carvedilol  6.25 mg Oral BID WC  . doxycycline  100 mg Oral Q12H  . famotidine  20 mg Oral QHS  . furosemide  80 mg Oral BID  . loratadine  10 mg Oral Daily  . methylPREDNISolone (SOLU-MEDROL) injection  40 mg Intravenous Q12H  . multivitamin with minerals  1 tablet Oral Daily  . oseltamivir  30 mg Oral BID  . pantoprazole  40 mg Oral QAC breakfast  . pyridOXINE  100 mg Oral Daily  . Rivaroxaban  15 mg Oral Daily  . sodium chloride  3 mL Intravenous Q12H  . tiotropium  18 mcg Inhalation Daily  . vitamin B-12  1,000 mcg Oral Daily     Electrolyte/Renal Profile and Glucose Profile:   Recent Labs Lab 06/27/15 0813 06/28/15  0553  NA 133* 137  K 3.8 4.1  CL 88* 93*  CO2 36* 35*  BUN 15 24*  CREATININE 1.11* 1.19*  CALCIUM 8.7* 8.5*  GLUCOSE 169* 260*   Protein Profile: No results for input(s): ALBUMIN in the last 168 hours.  Gastrointestinal Profile: Last BM:  06/28/2015   Weight Change: Pt reports weight 159-160lbs usually. Pt reports weighing herself not daily but when she remembers  Height:   Ht Readings from Last 1 Encounters:  06/27/15 5\' 6"  (1.676 m)    Weight:   Wt Readings from Last 1 Encounters:  06/27/15 161 lb 4.8 oz (73.165 kg)    Wt Readings from Last 10 Encounters:  06/27/15 161 lb 4.8 oz (73.165 kg)  06/21/15 160 lb (72.576 kg)  05/29/15 162 lb 6.4 oz (73.664 kg)  05/16/15 160 lb (72.576 kg)  05/01/15 160 lb 3.2 oz (72.666 kg)  04/21/15 156 lb (70.761  kg)  04/14/15 158 lb 12.8 oz (72.031 kg)  03/31/15 156 lb 8 oz (70.988 kg)  03/28/15 155 lb 9.6 oz (70.58 kg)  03/15/15 153 lb 6.4 oz (69.582 kg)    BMI:  Body mass index is 26.05 kg/(m^2).   EDUCATION NEEDS:   No education needs identified at this time   [place], RD, LDN Pager [phone] Weekend/On-Call Pager [phone]

## 2015-06-29 NOTE — Progress Notes (Signed)
Pt requesting Ambien for sleep as she did not sleep well last night.  Spoke with Dr Jannifer Franklin and received order for Ambien. Dorna Bloom RN

## 2015-06-29 NOTE — Discharge Summary (Addendum)
[place] [facility] [place] NAME: [name] [name]    MR#:  [id number]  DATE OF BIRTH:  Nov 07, 1922  DATE OF ADMISSION:  06/27/2015 ADMITTING PHYSICIAN: Loletha Grayer, MD  DATE OF DISCHARGE: 06/30/2015  PRIMARY CARE PHYSICIAN: [name], MD    ADMISSION DIAGNOSIS:  Hypoxia [R09.02] COPD exacerbation ([place]) [J44.1]  DISCHARGE DIAGNOSIS:  Active Problems:   COPD with acute exacerbation ([place])   SECONDARY DIAGNOSIS:   Past Medical History  Diagnosis Date  . HTN (hypertension)   . COPD (chronic obstructive pulmonary disease) (HCC)     a. on home O2 nightly.  . Diverticulosis   . Takotsubo cardiomyopathy 10/2012    a. 10/2012: presented w/ cardiogenic shock/systolic CHF/VDRF - EF 123456 by cath, 40% by f/u echo - cath with mild nonobstructive CAD. b. Not discharged on ACEI due to hypotension.  . Cardiogenic shock ([place])     a. 10/2012 - due to Takotsubo CM.  [name] Respiratory failure ([place])     a. On home O2 nightly. b. VDRF 10/2012 - due to CAP/pulm edema in setting of cardiogenic shock/Takotsubo CM.  [name] CAP (community acquired pneumonia)     a. 10/2012 - will need f/u CXR in June 2014 to ensure clearance.  . Esophageal stricture     a. s/p dilitation 10/2012.  [name] Pleural effusion, right     a. s/p thoracentesis 10/28/12.  . Atrial fibrillation ([place])     a. Dx 10/2012, rate controlled, started on Xarelto.  . Claudication ([place])     a. ABI 10/2012 - R 0.87, L 0.74.  [name] GERD (gastroesophageal reflux disease)   . CKD (chronic kidney disease), stage III   . Breast CA (HCC)     hx  . Abdominal hernia     chronic  . STEMI (ST elevation myocardial infarction) ([place])   . Cellulitis   . CHF (congestive heart failure) ([place])   . CAD (coronary artery disease)     a. 10/2012 - presented w/ STEMI felt due to Takotsubo - mild nonobstructive dz by cath.    HOSPITAL COURSE:   82 year old female with past medical history of COPD, hip attention, Takasubo  cardiomyopathy, history of esophageal stricture, chronic a. Fib, GERD, chronic kidney disease stage III, history of breast cancer, history of CHF who presented to the hospital due to shortness of breath and noted to be in COPD exacerbation.  #1 COPD exacerbation-this was the cause of patient's shortness of breath on admission. She was treated with IV steroids, desonide nebs, albuterol nebs and has clinically improved. -She is being discharged to a skilled nursing facility now on oral prednisone taper empiric antibiotics as mentioned below. -Patient is already on oxygen.    #2 history of CHF-clinically patient was not in congestive heart failure while in the hospiital - she will Continue Lasix, Coreg.  #3 influenza-patient was positive for influenza A PCR. She was clinically asymptomatic without any superimposed pneumonia.  - she will cont. Two more days of Tamiflu.   #4 GERD- She will continue Omeprazole.   #5 history of chronic afibrillation- she remained rate controlled.  - she will continue Coreg, Xarelto  DISCHARGE CONDITIONS:   Stable.   CONSULTS OBTAINED:     DRUG ALLERGIES:   Allergies  Allergen Reactions  . Penicillins Itching, Swelling, Rash and Other (See Comments)    Pt is unable to answer additional questions about this medication because it happened so long ago.  [name] [Levofloxacin  In D5w]   . Sulfa Antibiotics Other (See Comments)    Reaction:  Unknown     DISCHARGE MEDICATIONS:   Current Discharge Medication List    START taking these medications   Details  doxycycline (VIBRA-TABS) 100 MG tablet Take 1 tablet (100 mg total) by mouth every 12 (twelve) hours. Qty: 10 tablet    oseltamivir (TAMIFLU) 30 MG capsule Take 1 capsule (30 mg total) by mouth 2 (two) times daily. Qty: 6 capsule    predniSONE (DELTASONE) 10 MG tablet Label  & dispense according to the schedule below. 5 Pills PO for 1 day then, 4 Pills PO for 1 day, 3 Pills PO for 1 day, 2  Pills PO for 1 day, 1 Pill PO for 1 days then STOP. Qty: 15 tablet, Refills: 0      CONTINUE these medications which have NOT CHANGED   Details  albuterol (PROVENTIL HFA;VENTOLIN HFA) 108 (90 BASE) MCG/ACT inhaler Inhale 2 puffs into the lungs every 4 (four) hours as needed for wheezing or shortness of breath.    albuterol (PROVENTIL) (2.5 MG/3ML) 0.083% nebulizer solution Take 2.5 mg by nebulization 4 (four) times daily as needed for wheezing or shortness of breath.    carvedilol (COREG) 6.25 MG tablet Take 6.25 mg by mouth 2 (two) times daily with a meal.    Fluticasone-Salmeterol (ADVAIR) 250-50 MCG/DOSE AEPB Inhale 1 puff into the lungs 2 (two) times daily.    furosemide (LASIX) 40 MG tablet Take 80 mg by mouth 2 (two) times daily.     loratadine (CLARITIN) 10 MG tablet Take 1 tablet (10 mg total) by mouth daily. Qty: 30 tablet, Refills: 0   Associated Diagnoses: Upper respiratory infection    Multiple Vitamin (MULTIVITAMIN WITH MINERALS) TABS tablet Take 1 tablet by mouth daily.    omeprazole (PRILOSEC) 20 MG capsule Take 1 capsule (20 mg total) by mouth daily. Qty: 30 capsule, Refills: 11    pyridOXINE (VITAMIN B-6) 100 MG tablet Take 100 mg by mouth daily.    ranitidine (ZANTAC) 150 MG tablet Take 150 mg by mouth at bedtime.     Rivaroxaban (XARELTO) 15 MG TABS tablet Take 15 mg by mouth daily.     tiotropium (SPIRIVA) 18 MCG inhalation capsule Place 18 mcg into inhaler and inhale daily.    vitamin B-12 (CYANOCOBALAMIN) 1000 MCG tablet Take 1,000 mcg by mouth daily.         DISCHARGE INSTRUCTIONS:   DIET:  Cardiac diet  DISCHARGE CONDITION:  Stable  ACTIVITY:  Activity as tolerated  OXYGEN:  Home Oxygen: Yes.     Oxygen Delivery: 2 liters/min via Patient connected to nasal cannula oxygen  DISCHARGE LOCATION:  nursing home   If you experience worsening of your admission symptoms, develop shortness of breath, life threatening emergency, suicidal or  homicidal thoughts you must seek medical attention immediately by calling 911 or calling your MD immediately  if symptoms less severe.  You Must read complete instructions/literature along with all the possible adverse reactions/side effects for all the Medicines you take and that have been prescribed to you. Take any new Medicines after you have completely understood and accpet all the possible adverse reactions/side effects.   Please note  You were cared for by a hospitalist during your hospital stay. If you have any questions about your discharge medications or the care you received while you were in the hospital after you are discharged, you can call the unit and asked to speak with the  hospitalist on call if the hospitalist that took care of you is not available. Once you are discharged, your primary care physician will handle any further medical issues. Please note that NO REFILLS for any discharge medications will be authorized once you are discharged, as it is imperative that you return to your primary care physician (or establish a relationship with a primary care physician if you do not have one) for your aftercare needs so that they can reassess your need for medications and monitor your lab values.     Today   Shortness of breath improved.  No chest pain. No N/V.  + mild Post-nasal drip.    VITAL SIGNS:  Blood pressure 133/66, pulse 72, temperature 97.9 F (36.6 C), temperature source Oral, resp. rate 18, height 5\' 6"  (1.676 m), weight 73.165 kg (161 lb 4.8 oz), SpO2 94 %.  I/O:   Intake/Output Summary (Last 24 hours) at 06/29/15 1027 Last data filed at 06/28/15 2100  Gross per 24 hour  Intake      0 ml  Output   1750 ml  Net  -1750 ml    PHYSICAL EXAMINATION:   GENERAL: 82 y.o.-year-old patient lying in the bed with no acute distress.  EYES: Pupils equal, round, reactive to light and accommodation. No scleral icterus. Extraocular muscles intact.  HEENT: Head  atraumatic, normocephalic. Oropharynx and nasopharynx clear.  NECK: Supple, no jugular venous distention. No thyroid enlargement, no tenderness.  LUNGS: Prolonged inspiratory and expiratory phase, minimal end-exp. Wheezing, No rales, rhonchi. No use of accessory muscles of respiration.  CARDIOVASCULAR: S1, S2 normal. No murmurs, rubs, or gallops.  ABDOMEN: Soft, nontender, nondistended. Bowel sounds present. No organomegaly or mass.  EXTREMITIES: No cyanosis, clubbing or edema b/l.  NEUROLOGIC: Cranial nerves II through XII are intact. No focal Motor or sensory deficits b/l. Globally weak PSYCHIATRIC: The patient is alert and oriented x 3.  SKIN: No obvious rash, lesion, or ulcer.   DATA REVIEW:   CBC  Recent Labs Lab 06/28/15 0553  WBC 10.9  HGB 12.4  HCT 37.8  PLT 161    Chemistries   Recent Labs Lab 06/28/15 0553  NA 137  K 4.1  CL 93*  CO2 35*  GLUCOSE 260*  BUN 24*  CREATININE 1.19*  CALCIUM 8.5*    Cardiac Enzymes  Recent Labs Lab 06/27/15 1742  TROPONINI 0.14*    RADIOLOGY:  No results found.    Management plans discussed with the patient, family and they are in agreement.  CODE STATUS:     Code Status Orders        Start     Ordered   06/27/15 1043  Full code   Continuous     06/27/15 1043    Code Status History    Date Active Date Inactive Code Status Order ID Comments User Context   04/10/2015  6:10 PM 04/14/2015  5:52 PM Full Code [id number]  Max Sane, MD Inpatient   03/03/2015  8:05 PM 03/08/2015  6:00 PM Full Code [id number]  Fritzi Mandes, MD ED   02/07/2015 11:08 AM 02/07/2015  7:00 PM Full Code [id number]  Katha Cabal, MD Inpatient   10/25/2012 10:04 PM 11/06/2012  7:12 PM Full Code [id number]  Elsie Stain, MD Inpatient      TOTAL TIME TAKING CARE OF THIS PATIENT: 40 minutes.    Henreitta Leber M.D on 06/29/2015 at 10:27 AM  Between 7am to 6pm - Pager - [phone]  After 6pm go to  [email or web address] - password [email or web address]  [facility]  Office  [phone]  CC: Primary care physician; [name], MD

## 2015-06-30 DIAGNOSIS — J449 Chronic obstructive pulmonary disease, unspecified: Secondary | ICD-10-CM | POA: Diagnosis not present

## 2015-06-30 DIAGNOSIS — J111 Influenza due to unidentified influenza virus with other respiratory manifestations: Secondary | ICD-10-CM | POA: Diagnosis not present

## 2015-06-30 DIAGNOSIS — I482 Chronic atrial fibrillation: Secondary | ICD-10-CM | POA: Diagnosis not present

## 2015-06-30 DIAGNOSIS — K219 Gastro-esophageal reflux disease without esophagitis: Secondary | ICD-10-CM | POA: Diagnosis not present

## 2015-06-30 DIAGNOSIS — Z9981 Dependence on supplemental oxygen: Secondary | ICD-10-CM | POA: Diagnosis not present

## 2015-06-30 DIAGNOSIS — I4891 Unspecified atrial fibrillation: Secondary | ICD-10-CM | POA: Diagnosis not present

## 2015-06-30 DIAGNOSIS — N183 Chronic kidney disease, stage 3 (moderate): Secondary | ICD-10-CM | POA: Diagnosis not present

## 2015-06-30 DIAGNOSIS — I5022 Chronic systolic (congestive) heart failure: Secondary | ICD-10-CM | POA: Diagnosis not present

## 2015-06-30 DIAGNOSIS — R2689 Other abnormalities of gait and mobility: Secondary | ICD-10-CM | POA: Diagnosis not present

## 2015-06-30 DIAGNOSIS — J811 Chronic pulmonary edema: Secondary | ICD-10-CM | POA: Diagnosis not present

## 2015-06-30 DIAGNOSIS — I10 Essential (primary) hypertension: Secondary | ICD-10-CM | POA: Diagnosis not present

## 2015-06-30 DIAGNOSIS — R531 Weakness: Secondary | ICD-10-CM | POA: Diagnosis not present

## 2015-06-30 DIAGNOSIS — R0602 Shortness of breath: Secondary | ICD-10-CM | POA: Diagnosis not present

## 2015-06-30 DIAGNOSIS — J441 Chronic obstructive pulmonary disease with (acute) exacerbation: Secondary | ICD-10-CM | POA: Diagnosis not present

## 2015-06-30 DIAGNOSIS — I509 Heart failure, unspecified: Secondary | ICD-10-CM | POA: Diagnosis not present

## 2015-06-30 DIAGNOSIS — I739 Peripheral vascular disease, unspecified: Secondary | ICD-10-CM | POA: Diagnosis not present

## 2015-06-30 DIAGNOSIS — I252 Old myocardial infarction: Secondary | ICD-10-CM | POA: Diagnosis not present

## 2015-06-30 DIAGNOSIS — I5181 Takotsubo syndrome: Secondary | ICD-10-CM | POA: Diagnosis not present

## 2015-06-30 DIAGNOSIS — K449 Diaphragmatic hernia without obstruction or gangrene: Secondary | ICD-10-CM | POA: Diagnosis not present

## 2015-06-30 DIAGNOSIS — I129 Hypertensive chronic kidney disease with stage 1 through stage 4 chronic kidney disease, or unspecified chronic kidney disease: Secondary | ICD-10-CM | POA: Diagnosis not present

## 2015-06-30 DIAGNOSIS — K5732 Diverticulitis of large intestine without perforation or abscess without bleeding: Secondary | ICD-10-CM | POA: Diagnosis not present

## 2015-06-30 LAB — GLUCOSE, CAPILLARY: GLUCOSE-CAPILLARY: 111 mg/dL — AB (ref 65–99)

## 2015-06-30 MED ORDER — OSELTAMIVIR PHOSPHATE 30 MG PO CAPS: 30.0000 mg | ORAL_CAPSULE | Freq: Two times a day (BID) | ORAL | Status: AC

## 2015-06-30 NOTE — Clinical Social Work Placement (Signed)
   CLINICAL SOCIAL WORK PLACEMENT  NOTE  Date:  06/30/2015  Patient Details  Name: [name] [name] MRN: [id number] Date of Birth: [birth year]  Clinical Social Work is seeking post-discharge placement for this patient at the [place] level of care (*CSW will initial, date and re-position this form in  chart as items are completed):  Yes   Patient/family provided with [facility] Work Department's list of facilities offering this level of care within the geographic area requested by the patient (or if unable, by the patient's family).  Yes   Patient/family informed of their freedom to choose among providers that offer the needed level of care, that participate in Medicare, Medicaid or managed care program needed by the patient, have an available bed and are willing to accept the patient.  Yes   Patient/family informed of [facility]'s ownership interest in [facility] and [facility], as well as of the fact that they are under no obligation to receive care at these facilities.  PASRR submitted to EDS on       PASRR number received on       Existing PASRR number confirmed on 06/29/15     FL2 transmitted to all facilities in geographic area requested by pt/family on 06/29/15     FL2 transmitted to all facilities within larger geographic area on       Patient informed that his/her managed care company has contracts with or will negotiate with certain facilities, including the following:        Yes   Patient/family informed of bed offers received.  Patient chooses bed at [facility]     Physician recommends and patient chooses bed at  [facility])    Patient to be transferred to [facility] on 06/30/15.  Patient to be transferred to facility by [facility]     Patient family notified on 06/30/15 of transfer.  Name of family member notified:  Pt declined, she will reach out to family.      PHYSICIAN        Additional Comment:    _______________________________________________ Darden Dates, LCSW 06/30/2015, 2:53 PM

## 2015-06-30 NOTE — Progress Notes (Signed)
EMS called for transport. Haruki Arnold S, RN  

## 2015-06-30 NOTE — Progress Notes (Signed)
Patient discharged to [facility] via EMS. Madlyn Frankel, RN

## 2015-06-30 NOTE — Clinical Social Work Note (Signed)
Pt is ready for discharge to [facility]. Pt and daughter are aware and agreeable to discharge plan. RN called report and EMS will provide transportation. CSW is signing off as no further needs identified.   Darden Dates, MSW, LCSW Clinical Social Worker  [phone]

## 2015-06-30 NOTE — Progress Notes (Signed)
Report called to [facility]. Madlyn Frankel, RN

## 2015-07-03 ENCOUNTER — Other Ambulatory Visit: Payer: Self-pay | Admitting: *Deleted

## 2015-07-03 NOTE — Patient Outreach (Signed)
Unsuccessful outreach for [facility] outreach for transition of care. HIPAA compliant message left.   Plan: RNCM will await return call  RNCM will attempt outreach again in 2 days.  Erisha Paugh RN, BSN  [facility] Care Management ([phone]) 

## 2015-07-04 DIAGNOSIS — I509 Heart failure, unspecified: Secondary | ICD-10-CM | POA: Diagnosis not present

## 2015-07-04 DIAGNOSIS — J449 Chronic obstructive pulmonary disease, unspecified: Secondary | ICD-10-CM | POA: Diagnosis not present

## 2015-07-04 DIAGNOSIS — I4891 Unspecified atrial fibrillation: Secondary | ICD-10-CM | POA: Diagnosis not present

## 2015-07-04 DIAGNOSIS — I10 Essential (primary) hypertension: Secondary | ICD-10-CM | POA: Diagnosis not present

## 2015-07-07 ENCOUNTER — Encounter: Payer: Self-pay | Admitting: *Deleted

## 2015-07-07 ENCOUNTER — Other Ambulatory Visit: Payer: Self-pay | Admitting: *Deleted

## 2015-07-07 NOTE — Patient Outreach (Addendum)
[facility]) Care Management  [facility] Social Work  07/07/2015  [name] [name] 08-26-1922 [id number]  Subjective:  Patient is a 82 year old female.  Objective:   Current Medications:  Current Outpatient Prescriptions  Medication Sig Dispense Refill  . albuterol (PROVENTIL HFA;VENTOLIN HFA) 108 (90 BASE) MCG/ACT inhaler Inhale 2 puffs into the lungs every 4 (four) hours as needed for wheezing or shortness of breath.    [name] albuterol (PROVENTIL) (2.5 MG/3ML) 0.083% nebulizer solution Take 2.5 mg by nebulization 4 (four) times daily as needed for wheezing or shortness of breath.    . carvedilol (COREG) 6.25 MG tablet Take 6.25 mg by mouth 2 (two) times daily with a meal.    . Fluticasone-Salmeterol (ADVAIR) 250-50 MCG/DOSE AEPB Inhale 1 puff into the lungs 2 (two) times daily.    . furosemide (LASIX) 40 MG tablet Take 80 mg by mouth 2 (two) times daily.     [name] loratadine (CLARITIN) 10 MG tablet Take 1 tablet (10 mg total) by mouth daily. 30 tablet 0  . Multiple Vitamin (MULTIVITAMIN WITH MINERALS) TABS tablet Take 1 tablet by mouth daily.    [name] omeprazole (PRILOSEC) 20 MG capsule Take 1 capsule (20 mg total) by mouth daily. 30 capsule 11  . predniSONE (DELTASONE) 10 MG tablet Label  & dispense according to the schedule below. 5 Pills PO for 1 day then, 4 Pills PO for 1 day, 3 Pills PO for 1 day, 2 Pills PO for 1 day, 1 Pill PO for 1 days then STOP. 15 tablet 0  . pyridOXINE (VITAMIN B-6) 100 MG tablet Take 100 mg by mouth daily.    . ranitidine (ZANTAC) 150 MG tablet Take 150 mg by mouth at bedtime.     . Rivaroxaban (XARELTO) 15 MG TABS tablet Take 15 mg by mouth daily.     [name] tiotropium (SPIRIVA) 18 MCG inhalation capsule Place 18 mcg into inhaler and inhale daily.    . vitamin B-12 (CYANOCOBALAMIN) 1000 MCG tablet Take 1,000 mcg by mouth daily.     No current facility-administered medications for this visit.    Functional Status:  In your present state of health, do you have  any difficulty performing the following activities: 07/07/2015 06/27/2015  Hearing? - N  Vision? - N  Difficulty concentrating or making decisions? - N  Walking or climbing stairs? - N  Dressing or bathing? - N  Doing errands, shopping? - N  Conservation officer, nature and eating ? N -  Using the Toilet? N -  In the past six months, have you accidently leaked urine? Y -  Do you have problems with loss of bowel control? N -  Managing your Medications? N -  Managing your Finances? Y -  Housekeeping or managing your Housekeeping? N -    Fall/Depression Screening:  PHQ 2/9 Scores 07/07/2015 05/16/2015 04/24/2015 03/15/2015 12/28/2014 12/23/2014  PHQ - 2 Score 0 0 0 0 0 0    Assessment:  This Education officer, museum visited patient at [facility].  Per patient, she has a discharge date set for 07/14/15 with home health.  Patient describes a positive support system. Daughter in law and close friend Parke Simmers visit daily.   Parke Simmers transports patient to all doctor appointments.  Daughter law assits with bill payments.  Patient identified no barriers to discharging next Friday, anxious to return home.  This Education officer, museum spoke with [employer] of social work.  Patient's discharge date confirmed for 07/28/15 with [facility].  No barriers to discharge identified,  Plan: This social worker will notify RNCM of anticipated discharge date of 07/14/15.            This social worker will follow up with patient and discharge planner on 07/14/15 to ensure that patient discharged.   [facility] CM Care Plan Problem One        Most Recent Value   Care Plan Problem One  patient currently in a skilled nursing faciliity   Role Documenting the Problem One  Clinical Social Worker   Care Plan for Problem One  Active   THN CM Short Term Goal #2 (0-30 days)  patient will activley participate in physical and occupational therapy for  the next 30 days   THN CM Short Term Goal #2 Met Date  07/07/15   Interventions for Short Term  Goal #2  this social worker encouraged patient to participate in each treatment session    [facility] CM Care Plan Problem Two        Most Recent Value   Care Plan Problem Two  patient in skilled nursing home   Role Documenting the Problem Two  Clinical Social Worker   Care Plan for Problem Two  Active   THN CM Short Term Goal #1 (0-30 days)  patient and social worker will collaborate with discharge planner to ensure a safe and stabel discharge within the next 2 weeks   THN CM Short Term Goal #1 Start Date  07/07/15   Interventions for Short Term Goal #2   patient and social worker discussed discharge plan with Education officer, museum.  Anticipated discharge date scheduled for 07/14/15 with HH.  Patient has no DME needs    [facility] CM Care Plan Problem Three        Most Recent Value   THN CM Short Term Goal #1 Met Date  -- [Pt unable to be successful with this related to the tablet not responding to her finger tip: RNCM gave pt a stylus today]     [place], [place] Management [phone]

## 2015-07-11 ENCOUNTER — Ambulatory Visit: Payer: Medicare Other | Admitting: Family Medicine

## 2015-07-14 ENCOUNTER — Other Ambulatory Visit: Payer: Self-pay | Admitting: *Deleted

## 2015-07-14 NOTE — Patient Outreach (Signed)
[facility]) Care Management  07/14/2015  [name] [name] 1923/02/23 [id number]   Phone call to [facility], discharge planner(Ms. Womack) to confirm patient's discharge today.  Voicemail message left for a return call.    Sheralyn Boatman [facility] Care Management [phone]

## 2015-07-14 NOTE — Patient Outreach (Signed)
[facility]) Care Management  07/14/2015  [name] [name] [birth year] [id number]   Return phone call from Ms. Womack, discharge planner for [facility] who states that due to a respiratory infection, patient will not discharge today.  She is being treated with antibiotics and her anticipated discharge date will be Thursday, 07/20/15.  Plan:  This social worker will follow up with patient 07/19/15.  Sheralyn Boatman [facility] Care Management [phone]

## 2015-07-21 ENCOUNTER — Other Ambulatory Visit: Payer: Self-pay | Admitting: Family Medicine

## 2015-07-22 DIAGNOSIS — J449 Chronic obstructive pulmonary disease, unspecified: Secondary | ICD-10-CM | POA: Diagnosis not present

## 2015-07-24 DIAGNOSIS — I130 Hypertensive heart and chronic kidney disease with heart failure and stage 1 through stage 4 chronic kidney disease, or unspecified chronic kidney disease: Secondary | ICD-10-CM | POA: Diagnosis not present

## 2015-07-24 DIAGNOSIS — N189 Chronic kidney disease, unspecified: Secondary | ICD-10-CM | POA: Diagnosis not present

## 2015-07-24 DIAGNOSIS — I5021 Acute systolic (congestive) heart failure: Secondary | ICD-10-CM | POA: Diagnosis not present

## 2015-07-24 DIAGNOSIS — J441 Chronic obstructive pulmonary disease with (acute) exacerbation: Secondary | ICD-10-CM | POA: Diagnosis not present

## 2015-07-25 DIAGNOSIS — I130 Hypertensive heart and chronic kidney disease with heart failure and stage 1 through stage 4 chronic kidney disease, or unspecified chronic kidney disease: Secondary | ICD-10-CM | POA: Diagnosis not present

## 2015-07-25 DIAGNOSIS — J441 Chronic obstructive pulmonary disease with (acute) exacerbation: Secondary | ICD-10-CM | POA: Diagnosis not present

## 2015-07-25 DIAGNOSIS — I5021 Acute systolic (congestive) heart failure: Secondary | ICD-10-CM | POA: Diagnosis not present

## 2015-07-25 DIAGNOSIS — N189 Chronic kidney disease, unspecified: Secondary | ICD-10-CM | POA: Diagnosis not present

## 2015-07-27 ENCOUNTER — Other Ambulatory Visit: Payer: Self-pay | Admitting: *Deleted

## 2015-07-27 ENCOUNTER — Encounter: Payer: Self-pay | Admitting: *Deleted

## 2015-07-27 DIAGNOSIS — I130 Hypertensive heart and chronic kidney disease with heart failure and stage 1 through stage 4 chronic kidney disease, or unspecified chronic kidney disease: Secondary | ICD-10-CM | POA: Diagnosis not present

## 2015-07-27 DIAGNOSIS — N189 Chronic kidney disease, unspecified: Secondary | ICD-10-CM | POA: Diagnosis not present

## 2015-07-27 DIAGNOSIS — J441 Chronic obstructive pulmonary disease with (acute) exacerbation: Secondary | ICD-10-CM | POA: Diagnosis not present

## 2015-07-27 DIAGNOSIS — I5021 Acute systolic (congestive) heart failure: Secondary | ICD-10-CM | POA: Diagnosis not present

## 2015-07-27 NOTE — Patient Outreach (Signed)
[facility]) Care Management  07/27/2015  [name] [name] Aug 28, 1922 [id number]   Phone call to patient to follow up with her following her skilled nursing facility stay.  Per patient, she discharged on 07/20/15 with home health through [facility]..  Patient reports having all of her medications, good support system and transportation to medical appointments. Patient verbalized having no further social work needs at this time.  Case to be closed to social work at this time.  RN Health Coach to be notified.   Sheralyn Boatman [facility] Care Management [phone]

## 2015-07-27 NOTE — Patient Outreach (Signed)
[facility]) Care Management  07/27/2015  [name] [name] 1922/07/29 [id number]   Initial transition of care call Received referral 2/16, patient discharged from rehab on 2/9.  Spoke with [name] [name] as part of the transition of care program, patient gave verbal consent.  [name] [name] discussed her recent discharge from rehab, and states that she is doing okay, more tired today, continues to wear her oxygen at 3 liters nasal cannula and some shortness of breath with activity. [name]. Lanthier states that she has forgotten to weigh on today, but she weighed 160 on yesterday no weight gain greater than  3 pounds ,she reported having some swelling in her legs earlier this week but today it is better. Patient reports that she is trying to watch her salt, educated patient on symptoms to call PCP, shortness of breath, weight gain greater than 3  pounds in a day, 5 pounds in a week and increased swelling. [name] [name] has [employer] scales, but she states that she has been weighing on her regular scales, explained to patient that the scales are apart of insurance company and a way to help monitor/manage early signs of heart failure, she prefers her scales.  [name] [name] reports taking her medication as prescribed, and not problems purchasing medications.  [name] [name] stated that she has not made an post discharge appointment with Dr.Hawkins, she agreed that I can assist her with that, she states that her friend will provide transportation.  [name] [name] reports that home health RN has visited patient and has another scheduled visit on today, and physical therapy will see her in the am, patient unable to recall name of agency at present.    Plan Patient will remain active with transition of care program, next call on 2/21. Patient will attend PCP appointment in the next 7 days Patient will continue to weigh daily and record I have called Dr.Hawkins, patient's office to schedule office visit,  scheduled for 2/23 at 0930 patient agreeable with that date and friend will provide transportation .   [facility] CM Care Plan Problem One        Most Recent Value   Care Plan Problem One  Patient    Role Documenting the Problem One  Care Management Coordinator   Care Plan for Problem One  Active   THN Long Term Goal (31-90 days)  Patient will not readmit to hospital in the next 31 days   THN Long Term Goal Start Date  07/27/15   Interventions for Problem One Long Term Goal  Educated on the Transition of care program, importance of notifying MD for symptomssooner, provided my contact infomation,    THN CM Short Term Goal #1 (0-30 days)  Patient will attend PCP appointment in the next 7 days   THN CM Short Term Goal #1 Start Date  07/27/15   Interventions for Short Term Goal #1  Called PCP office to schedule appointment, [name] [name] agreeable to date, and confirmed she will have transportation.     Joylene Draft, RN, [place] Management [phone]- Mobile [phone]- Toll Free Main Office

## 2015-07-28 DIAGNOSIS — N189 Chronic kidney disease, unspecified: Secondary | ICD-10-CM | POA: Diagnosis not present

## 2015-07-28 DIAGNOSIS — J441 Chronic obstructive pulmonary disease with (acute) exacerbation: Secondary | ICD-10-CM | POA: Diagnosis not present

## 2015-07-28 DIAGNOSIS — I5021 Acute systolic (congestive) heart failure: Secondary | ICD-10-CM | POA: Diagnosis not present

## 2015-07-28 DIAGNOSIS — I130 Hypertensive heart and chronic kidney disease with heart failure and stage 1 through stage 4 chronic kidney disease, or unspecified chronic kidney disease: Secondary | ICD-10-CM | POA: Diagnosis not present

## 2015-07-31 DIAGNOSIS — I130 Hypertensive heart and chronic kidney disease with heart failure and stage 1 through stage 4 chronic kidney disease, or unspecified chronic kidney disease: Secondary | ICD-10-CM | POA: Diagnosis not present

## 2015-07-31 DIAGNOSIS — N189 Chronic kidney disease, unspecified: Secondary | ICD-10-CM | POA: Diagnosis not present

## 2015-07-31 DIAGNOSIS — J441 Chronic obstructive pulmonary disease with (acute) exacerbation: Secondary | ICD-10-CM | POA: Diagnosis not present

## 2015-07-31 DIAGNOSIS — I5021 Acute systolic (congestive) heart failure: Secondary | ICD-10-CM | POA: Diagnosis not present

## 2015-08-01 ENCOUNTER — Other Ambulatory Visit: Payer: Self-pay | Admitting: *Deleted

## 2015-08-01 ENCOUNTER — Encounter: Payer: Self-pay | Admitting: *Deleted

## 2015-08-01 NOTE — Patient Outreach (Signed)
[facility]) Care Management  08/01/2015  [name] [name] Dec 15, 1922 [id number]  RNCM placed call to patient for scheduled transition of care call. No answer, I left a HIPPA compliant voice message with my return call number.  Plan If no return call , I will  try patient later today, and again on 2/22.  Joylene Draft, RN, [place] Management [phone]- Mobile [phone]- Toll Free Main Office

## 2015-08-01 NOTE — Patient Outreach (Signed)
[facility]) Care Management  08/01/2015  JAMILLIA BENDELL May 12, 1923 [id number]   RNCM spoke with patient for transition of care call follow up, Mrs.Lucena reports that her  breathing is no worse, continues to wear her oxygen at 3 liters. Mrs.Yambao denies increase in swelling, or weight gain she reports her weight has being stable at around 160, and is able to verbalize awareness to call MD for weight gain greater than 2 pound in a day and 5 pounds in a week, increased shortness of breath.   Patient has a appointment with PCP on 2/23 and states she has transportation arranged. Patient denies any other concerns at this time, reminded to call for worsen of symptoms.  Plan Patient will continue with transition of care program,  Initial transition of care home visit scheduled for 3/2.   Joylene Draft, RN, [place] Management [phone]- Mobile [phone]- Toll Free Main Office

## 2015-08-02 DIAGNOSIS — N189 Chronic kidney disease, unspecified: Secondary | ICD-10-CM | POA: Diagnosis not present

## 2015-08-02 DIAGNOSIS — J441 Chronic obstructive pulmonary disease with (acute) exacerbation: Secondary | ICD-10-CM | POA: Diagnosis not present

## 2015-08-02 DIAGNOSIS — I5021 Acute systolic (congestive) heart failure: Secondary | ICD-10-CM | POA: Diagnosis not present

## 2015-08-02 DIAGNOSIS — I130 Hypertensive heart and chronic kidney disease with heart failure and stage 1 through stage 4 chronic kidney disease, or unspecified chronic kidney disease: Secondary | ICD-10-CM | POA: Diagnosis not present

## 2015-08-03 ENCOUNTER — Ambulatory Visit (INDEPENDENT_AMBULATORY_CARE_PROVIDER_SITE_OTHER): Payer: Medicare Other | Admitting: Family Medicine

## 2015-08-03 ENCOUNTER — Encounter: Payer: Self-pay | Admitting: Family Medicine

## 2015-08-03 VITALS — BP 132/64 | HR 65 | Temp 98.4°F | Resp 16 | Ht 67.0 in | Wt 160.0 lb

## 2015-08-03 DIAGNOSIS — I481 Persistent atrial fibrillation: Secondary | ICD-10-CM | POA: Diagnosis not present

## 2015-08-03 DIAGNOSIS — I10 Essential (primary) hypertension: Secondary | ICD-10-CM

## 2015-08-03 DIAGNOSIS — L989 Disorder of the skin and subcutaneous tissue, unspecified: Secondary | ICD-10-CM | POA: Diagnosis not present

## 2015-08-03 DIAGNOSIS — I5021 Acute systolic (congestive) heart failure: Secondary | ICD-10-CM

## 2015-08-03 DIAGNOSIS — K222 Esophageal obstruction: Secondary | ICD-10-CM

## 2015-08-03 DIAGNOSIS — J9621 Acute and chronic respiratory failure with hypoxia: Secondary | ICD-10-CM | POA: Diagnosis not present

## 2015-08-03 DIAGNOSIS — I5022 Chronic systolic (congestive) heart failure: Secondary | ICD-10-CM

## 2015-08-03 DIAGNOSIS — I4819 Other persistent atrial fibrillation: Secondary | ICD-10-CM

## 2015-08-03 DIAGNOSIS — K2100 Gastro-esophageal reflux disease with esophagitis, without bleeding: Secondary | ICD-10-CM

## 2015-08-03 NOTE — Progress Notes (Signed)
Name: [name] [name]   MRN: [id number]    DOB: [birth year]   Date:08/03/2015       Progress Note  Subjective  Chief Complaint  Chief Complaint  Patient presents with  . COPD    discharged 3 weeks ago doing well     HPI Here for f/u of hospitalization for resp. And CHF.  Released to Rehab.  Now home and getting home PT.  Uses O2 continuously.  Feeling pretty well at this time.  Breathing doing prettyhwell.  No problem-specific assessment & plan notes found for this encounter.   Past Medical History  Diagnosis Date  . HTN (hypertension)   . COPD (chronic obstructive pulmonary disease) (HCC)     a. on home O2 nightly.  . Diverticulosis   . Takotsubo cardiomyopathy 10/2012    a. 10/2012: presented w/ cardiogenic shock/systolic CHF/VDRF - EF 36% by cath, 40% by f/u echo - cath with mild nonobstructive CAD. b. Not discharged on ACEI due to hypotension.  . Cardiogenic shock ([place])     a. 10/2012 - due to Takotsubo CM.  [name] Respiratory failure ([place])     a. On home O2 nightly. b. VDRF 10/2012 - due to CAP/pulm edema in setting of cardiogenic shock/Takotsubo CM.  [name] CAP (community acquired pneumonia)     a. 10/2012 - will need f/u CXR in June 2014 to ensure clearance.  . Esophageal stricture     a. s/p dilitation 10/2012.  [name] Pleural effusion, right     a. s/p thoracentesis 10/28/12.  . Atrial fibrillation ([place])     a. Dx 10/2012, rate controlled, started on Xarelto.  . Claudication ([place])     a. ABI 10/2012 - R 0.87, L 0.74.  [name] GERD (gastroesophageal reflux disease)   . CKD (chronic kidney disease), stage III   . Breast CA (HCC)     hx  . Abdominal hernia     chronic  . STEMI (ST elevation myocardial infarction) ([place])   . Cellulitis   . CHF (congestive heart failure) ([place])   . CAD (coronary artery disease)     a. 10/2012 - presented w/ STEMI felt due to Takotsubo - mild nonobstructive dz by cath.    Past Surgical History  Procedure Laterality Date  . Mastectomy    . Cataract extraction     . Esophagogastroduodenoscopy N/A 11/05/2012    Procedure: ESOPHAGOGASTRODUODENOSCOPY (EGD) with esophageal savary dilatation;  Surgeon: Missy Sabins, MD;  Location: [facility];  Service: Endoscopy;  Laterality: N/A;  savary dil...no floro needed  . Bladder surgery    . Left heart catheterization with coronary angiogram N/A 10/24/2012    Procedure: LEFT HEART CATHETERIZATION WITH CORONARY ANGIOGRAM;  Surgeon: Sherren Mocha, MD;  Location[facility] CATH LAB;  Service: Cardiovascular;  Laterality: N/A;  . Peripheral vascular catheterization N/A 02/07/2015    Procedure: Abdominal Aortogram w/Lower Extremity;  Surgeon: Katha Cabal, MD;  Location: [place] CV LAB;  Service: Cardiovascular;  Laterality: N/A;  . Peripheral vascular catheterization  02/07/2015    Procedure: Lower Extremity Intervention;  Surgeon: Katha Cabal, MD;  Location: [place] CV LAB;  Service: Cardiovascular;;  . Coronary angioplasty    . Esophagogastroduodenoscopy (egd) with propofol N/A 05/23/2015    Procedure: ESOPHAGOGASTRODUODENOSCOPY (EGD) WITH PROPOFOL;  Surgeon: Lucilla Lame, MD;  Location: [facility] ENDOSCOPY;  Service: Endoscopy;  Laterality: N/A;    Family History  Problem Relation Age of Onset  . Heart attack Mother   . Stroke Mother   .  COPD Son   . Pneumonia Father     Social History   Social History  . Marital Status: Widowed    Spouse Name: N/A  . Number of Children: 1  . Years of Education: N/A   Occupational History  . Not on file.   Social History Main Topics  . Smoking status: Former Smoker -- 1.00 packs/day for 55 years    Types: Cigarettes  . Smokeless tobacco: Former Systems developer  . Alcohol Use: No  . Drug Use: No  . Sexual Activity: No   Other Topics Concern  . Not on file   Social History Narrative     Current outpatient prescriptions:  .  ADVAIR DISKUS 250-50 MCG/DOSE AEPB, USE 1 (ONE) INHALATION, TWO TIMES DAILY., Disp: 60 each, Rfl: 5 .  albuterol (PROVENTIL HFA;VENTOLIN  HFA) 108 (90 BASE) MCG/ACT inhaler, Inhale 2 puffs into the lungs every 4 (four) hours as needed for wheezing or shortness of breath., Disp: , Rfl:  .  albuterol (PROVENTIL) (2.5 MG/3ML) 0.083% nebulizer solution, Take 2.5 mg by nebulization 4 (four) times daily as needed for wheezing or shortness of breath., Disp: , Rfl:  .  carvedilol (COREG) 6.25 MG tablet, Take 6.25 mg by mouth 2 (two) times daily with a meal., Disp: , Rfl:  .  furosemide (LASIX) 40 MG tablet, Take 80 mg by mouth 2 (two) times daily. , Disp: , Rfl:  .  loratadine (CLARITIN) 10 MG tablet, Take 1 tablet (10 mg total) by mouth daily., Disp: 30 tablet, Rfl: 0 .  Multiple Vitamin (MULTIVITAMIN WITH MINERALS) TABS tablet, Take 1 tablet by mouth daily., Disp: , Rfl:  .  omeprazole (PRILOSEC) 20 MG capsule, Take 1 capsule (20 mg total) by mouth daily., Disp: 30 capsule, Rfl: 11 .  pyridOXINE (VITAMIN B-6) 100 MG tablet, Take 100 mg by mouth daily., Disp: , Rfl:  .  ranitidine (ZANTAC) 150 MG tablet, Take 150 mg by mouth at bedtime. , Disp: , Rfl:  .  Rivaroxaban (XARELTO) 15 MG TABS tablet, Take 15 mg by mouth daily. , Disp: , Rfl:  .  tiotropium (SPIRIVA) 18 MCG inhalation capsule, Place 18 mcg into inhaler and inhale daily., Disp: , Rfl:  .  vitamin B-12 (CYANOCOBALAMIN) 1000 MCG tablet, Take 1,000 mcg by mouth daily., Disp: , Rfl:   Allergies  Allergen Reactions  . Penicillins Itching, Swelling, Rash and Other (See Comments)    Pt is unable to answer additional questions about this medication because it happened so long ago.  [name] [Levofloxacin In D5w]   . Sulfa Antibiotics Other (See Comments)    Reaction:  Unknown      Review of Systems  Constitutional: Negative for fever, chills, weight loss and malaise/fatigue.  HENT: Negative for hearing loss.   Eyes: Negative for blurred vision and double vision.  Respiratory: Positive for shortness of breath. Negative for cough.   Cardiovascular: Negative for chest pain,  palpitations and leg swelling.  Gastrointestinal: Negative for heartburn, abdominal pain and blood in stool.  Genitourinary: Negative for dysuria, urgency and frequency.  Skin: Negative for rash.  Neurological: Negative for tremors, weakness and headaches.      Objective  Filed Vitals:   08/03/15 1000  BP: 132/64  Pulse: 65  Temp: 98.4 F (36.9 C)  TempSrc: Oral  Resp: 16  Height: 5' 7"  (1.702 m)  Weight: 160 lb (72.576 kg)  SpO2: 89%    Physical Exam  Constitutional: She is oriented to person, place, and  time and well-developed, well-nourished, and in no distress. No distress.  HENT:  Head: Normocephalic and atraumatic.  Eyes: Conjunctivae and EOM are normal. Pupils are equal, round, and reactive to light. No scleral icterus.  Neck: Normal range of motion. Neck supple. Carotid bruit is not present. No thyromegaly present.  Cardiovascular: Normal rate, regular rhythm and normal heart sounds.  Exam reveals no gallop and no friction rub.   No murmur heard. Pulmonary/Chest: Effort normal and breath sounds normal. No respiratory distress. She has no wheezes. She has no rales.  Decreased breath sounds throughout.  Musculoskeletal: She exhibits no edema.  Lymphadenopathy:    She has no cervical adenopathy.  Neurological: She is alert and oriented to person, place, and time.  Skin:  Multiple skin lesions on face and chests.  Probable basal cells.  Vitals reviewed.      Recent Results (from the past 2160 hour(s))  CBC with Differential     Status: Abnormal   Collection Time: 06/27/15  8:13 AM  Result Value Ref Range   WBC 7.5 3.6 - 11.0 K/uL   RBC 4.15 3.80 - 5.20 MIL/uL   Hemoglobin 12.7 12.0 - 16.0 g/dL   HCT 39.1 35.0 - 47.0 %   MCV 94.1 80.0 - 100.0 fL   MCH 30.5 26.0 - 34.0 pg   MCHC 32.4 32.0 - 36.0 g/dL   RDW 14.3 11.5 - 14.5 %   Platelets 153 150 - 440 K/uL   Neutrophils Relative % 78 %   Neutro Abs 5.9 1.4 - 6.5 K/uL   Lymphocytes Relative 12 %   Lymphs  Abs 0.9 (L) 1.0 - 3.6 K/uL   Monocytes Relative 10 %   Monocytes Absolute 0.7 0.2 - 0.9 K/uL   Eosinophils Relative 0 %   Eosinophils Absolute 0.0 0 - 0.7 K/uL   Basophils Relative 0 %   Basophils Absolute 0.0 0 - 0.1 K/uL  Basic metabolic panel     Status: Abnormal   Collection Time: 06/27/15  8:13 AM  Result Value Ref Range   Sodium 133 (L) 135 - 145 mmol/L   Potassium 3.8 3.5 - 5.1 mmol/L    Comment: HEMOLYSIS AT THIS LEVEL MAY AFFECT RESULT   Chloride 88 (L) 101 - 111 mmol/L   CO2 36 (H) 22 - 32 mmol/L   Glucose, Bld 169 (H) 65 - 99 mg/dL   BUN 15 6 - 20 mg/dL   Creatinine, Ser 1.11 (H) 0.44 - 1.00 mg/dL   Calcium 8.7 (L) 8.9 - 10.3 mg/dL   GFR calc non Af Amer 42 (L) >60 mL/min   GFR calc Af Amer 49 (L) >60 mL/min    Comment: (NOTE) The eGFR has been calculated using the CKD EPI equation. This calculation has not been validated in all clinical situations. eGFR's persistently <60 mL/min signify possible Chronic Kidney Disease.    Anion gap 9 5 - 15  Troponin I     Status: None   Collection Time: 06/27/15  8:13 AM  Result Value Ref Range   Troponin I 0.03 <0.031 ng/mL    Comment:        NO INDICATION OF MYOCARDIAL INJURY.   Troponin I     Status: Abnormal   Collection Time: 06/27/15  3:16 PM  Result Value Ref Range   Troponin I 0.14 (H) <0.031 ng/mL    Comment: READ BACK AND VERIFIED WITH ABIGAIL JACKSON ON 06/27/15 AT 1658PM BY TLB        PERSISTENTLY INCREASED  TROPONIN VALUES IN THE RANGE OF 0.04-0.49 ng/mL CAN BE SEEN IN:       -UNSTABLE ANGINA       -CONGESTIVE HEART FAILURE       -MYOCARDITIS       -CHEST TRAUMA       -ARRYHTHMIAS       -LATE PRESENTING MYOCARDIAL INFARCTION       -COPD   CLINICAL FOLLOW-UP RECOMMENDED.   Influenza panel by PCR (type A & B, H1N1)     Status: Abnormal   Collection Time: 06/27/15  3:39 PM  Result Value Ref Range   Influenza A By PCR POSITIVE (A) NEGATIVE   Influenza B By PCR NEGATIVE NEGATIVE   H1N1 flu by pcr NOT  DETECTED NOT DETECTED    Comment:        The Xpert Flu assay (FDA approved for nasal aspirates or washes and nasopharyngeal swab specimens), is intended as an aid in the diagnosis of influenza and should not be used as a sole basis for treatment.   Troponin I     Status: Abnormal   Collection Time: 06/27/15  5:42 PM  Result Value Ref Range   Troponin I 0.14 (H) <0.031 ng/mL    Comment: PREVIOUS RESULT CALLED ABIGAIL JACKSON ON 06/27/15 AT 1658PM BY TLB        PERSISTENTLY INCREASED TROPONIN VALUES IN THE RANGE OF 0.04-0.49 ng/mL CAN BE SEEN IN:       -UNSTABLE ANGINA       -CONGESTIVE HEART FAILURE       -MYOCARDITIS       -CHEST TRAUMA       -ARRYHTHMIAS       -LATE PRESENTING MYOCARDIAL INFARCTION       -COPD   CLINICAL FOLLOW-UP RECOMMENDED.   Basic metabolic panel     Status: Abnormal   Collection Time: 06/28/15  5:53 AM  Result Value Ref Range   Sodium 137 135 - 145 mmol/L   Potassium 4.1 3.5 - 5.1 mmol/L   Chloride 93 (L) 101 - 111 mmol/L   CO2 35 (H) 22 - 32 mmol/L   Glucose, Bld 260 (H) 65 - 99 mg/dL   BUN 24 (H) 6 - 20 mg/dL   Creatinine, Ser 1.19 (H) 0.44 - 1.00 mg/dL   Calcium 8.5 (L) 8.9 - 10.3 mg/dL   GFR calc non Af Amer 39 (L) >60 mL/min   GFR calc Af Amer 45 (L) >60 mL/min    Comment: (NOTE) The eGFR has been calculated using the CKD EPI equation. This calculation has not been validated in all clinical situations. eGFR's persistently <60 mL/min signify possible Chronic Kidney Disease.    Anion gap 9 5 - 15  CBC     Status: Abnormal   Collection Time: 06/28/15  5:53 AM  Result Value Ref Range   WBC 10.9 3.6 - 11.0 K/uL   RBC 3.94 3.80 - 5.20 MIL/uL   Hemoglobin 12.4 12.0 - 16.0 g/dL   HCT 37.8 35.0 - 47.0 %   MCV 95.9 80.0 - 100.0 fL   MCH 31.4 26.0 - 34.0 pg   MCHC 32.7 32.0 - 36.0 g/dL   RDW 14.7 (H) 11.5 - 14.5 %   Platelets 161 150 - 440 K/uL  Glucose, capillary     Status: Abnormal   Collection Time: 06/30/15  7:25 AM  Result Value Ref  Range   Glucose-Capillary 111 (H) 65 - 99 mg/dL     Assessment & Plan  Problem List Items Addressed This Visit      Cardiovascular and Mediastinum   Acute systolic CHF (congestive heart failure) (HCC) - Primary   Atrial fibrillation (HCC)   Chronic systolic heart failure ([place])   Essential hypertension     Respiratory   Respiratory failure with hypoxia ([place])   Relevant Orders   Ambulatory referral to Pulmonology     Digestive   GERD (gastroesophageal reflux disease)   Esophageal stricture    Other Visit Diagnoses    Skin lesions        Relevant Orders    Ambulatory referral to Dermatology     .1. Acute systolic CHF (congestive heart failure) (HCC)   2. Persistent atrial fibrillation ([place])   3. Chronic systolic heart failure ([place])   4. Essential hypertension   5. Acute on chronic respiratory failure with hypoxia (HCC)  - Ambulatory referral to Pulmonology  6. Gastroesophageal reflux disease with esophagitis   7. Esophageal stricture   8. Skin lesions  - Ambulatory referral to Dermatology  No orders of the defined types were placed in this encounter.   Continue all current meds.

## 2015-08-04 DIAGNOSIS — J441 Chronic obstructive pulmonary disease with (acute) exacerbation: Secondary | ICD-10-CM | POA: Diagnosis not present

## 2015-08-04 DIAGNOSIS — I130 Hypertensive heart and chronic kidney disease with heart failure and stage 1 through stage 4 chronic kidney disease, or unspecified chronic kidney disease: Secondary | ICD-10-CM | POA: Diagnosis not present

## 2015-08-04 DIAGNOSIS — N189 Chronic kidney disease, unspecified: Secondary | ICD-10-CM | POA: Diagnosis not present

## 2015-08-04 DIAGNOSIS — I5021 Acute systolic (congestive) heart failure: Secondary | ICD-10-CM | POA: Diagnosis not present

## 2015-08-07 ENCOUNTER — Ambulatory Visit (INDEPENDENT_AMBULATORY_CARE_PROVIDER_SITE_OTHER): Payer: Medicare Other | Admitting: Cardiovascular Disease

## 2015-08-07 ENCOUNTER — Encounter: Payer: Self-pay | Admitting: Cardiovascular Disease

## 2015-08-07 VITALS — BP 100/54 | HR 75 | Ht 67.0 in | Wt 160.0 lb

## 2015-08-07 DIAGNOSIS — R0789 Other chest pain: Secondary | ICD-10-CM

## 2015-08-07 DIAGNOSIS — R0602 Shortness of breath: Secondary | ICD-10-CM | POA: Diagnosis not present

## 2015-08-07 DIAGNOSIS — I4891 Unspecified atrial fibrillation: Secondary | ICD-10-CM | POA: Diagnosis not present

## 2015-08-07 NOTE — Progress Notes (Deleted)
Primary care physician: Dr. Luan Pulling  HPI  This is a pleasant 82 -year-old female who is here today for a followup visit for chronic systolic heart failure due to recurrent stress-induced cardiomyopathy,chronic atrial fibrillation, PAD and COPD. Previous cardiac catheterization in 2014 showed mild nonobstructive coronary artery disease.  she was hospitalized recently at [facility] for acute on chronic heart failure and COPD exacerbation. She improved with steroids and diuresis. She tends to improve with steroids.   Allergies  Allergen Reactions  . Penicillins Itching, Swelling, Rash and Other (See Comments)    Pt is unable to answer additional questions about this medication because it happened so long ago.  [name] [Levofloxacin In D5w]   . Sulfa Antibiotics Other (See Comments)    Reaction:  Unknown      Current Outpatient Prescriptions on File Prior to Visit  Medication Sig Dispense Refill  . ADVAIR DISKUS 250-50 MCG/DOSE AEPB USE 1 (ONE) INHALATION, TWO TIMES DAILY. 60 each 5  . albuterol (PROVENTIL HFA;VENTOLIN HFA) 108 (90 BASE) MCG/ACT inhaler Inhale 2 puffs into the lungs every 4 (four) hours as needed for wheezing or shortness of breath.    [name] albuterol (PROVENTIL) (2.5 MG/3ML) 0.083% nebulizer solution Take 2.5 mg by nebulization 4 (four) times daily as needed for wheezing or shortness of breath.    . carvedilol (COREG) 6.25 MG tablet Take 6.25 mg by mouth 2 (two) times daily with a meal.    . furosemide (LASIX) 40 MG tablet Take 80 mg by mouth 2 (two) times daily.     [name] loratadine (CLARITIN) 10 MG tablet Take 1 tablet (10 mg total) by mouth daily. 30 tablet 0  . Multiple Vitamin (MULTIVITAMIN WITH MINERALS) TABS tablet Take 1 tablet by mouth daily.    [name] omeprazole (PRILOSEC) 20 MG capsule Take 1 capsule (20 mg total) by mouth daily. 30 capsule 11  . pyridOXINE (VITAMIN B-6) 100 MG tablet Take 100 mg by mouth daily.    . ranitidine (ZANTAC) 150 MG tablet Take 150 mg by mouth at  bedtime.     . Rivaroxaban (XARELTO) 15 MG TABS tablet Take 15 mg by mouth daily.     [name] tiotropium (SPIRIVA) 18 MCG inhalation capsule Place 18 mcg into inhaler and inhale daily.    . vitamin B-12 (CYANOCOBALAMIN) 1000 MCG tablet Take 1,000 mcg by mouth daily.     No current facility-administered medications on file prior to visit.     Past Medical History  Diagnosis Date  . HTN (hypertension)   . COPD (chronic obstructive pulmonary disease) (HCC)     a. on home O2 nightly.  . Diverticulosis   . Takotsubo cardiomyopathy 10/2012    a. 10/2012: presented w/ cardiogenic shock/systolic CHF/VDRF - EF 123456 by cath, 40% by f/u echo - cath with mild nonobstructive CAD. b. Not discharged on ACEI due to hypotension.  . Cardiogenic shock ([place])     a. 10/2012 - due to Takotsubo CM.  [name] Respiratory failure ([place])     a. On home O2 nightly. b. VDRF 10/2012 - due to CAP/pulm edema in setting of cardiogenic shock/Takotsubo CM.  [name] CAP (community acquired pneumonia)     a. 10/2012 - will need f/u CXR in June 2014 to ensure clearance.  . Esophageal stricture     a. s/p dilitation 10/2012.  [name] Pleural effusion, right     a. s/p thoracentesis 10/28/12.  . Atrial fibrillation ([place])     a. Dx 10/2012, rate controlled, started on  Xarelto.  . Claudication ([place])     a. ABI 10/2012 - R 0.87, L 0.74.  [name] GERD (gastroesophageal reflux disease)   . CKD (chronic kidney disease), stage III   . Breast CA (HCC)     hx  . Abdominal hernia     chronic  . STEMI (ST elevation myocardial infarction) ([place])   . Cellulitis   . CHF (congestive heart failure) ([place])   . CAD (coronary artery disease)     a. 10/2012 - presented w/ STEMI felt due to Takotsubo - mild nonobstructive dz by cath.     Past Surgical History  Procedure Laterality Date  . Mastectomy    . Cataract extraction    . Esophagogastroduodenoscopy N/A 11/05/2012    Procedure: ESOPHAGOGASTRODUODENOSCOPY (EGD) with esophageal savary dilatation;  Surgeon: Missy Sabins, MD;  Location: [place];  Service: Endoscopy;  Laterality: N/A;  savary dil...no floro needed  . Bladder surgery    . Left heart catheterization with coronary angiogram N/A 10/24/2012    Procedure: LEFT HEART CATHETERIZATION WITH CORONARY ANGIOGRAM;  Surgeon: Sherren Mocha, MD;  Location: [facility] CATH LAB;  Service: Cardiovascular;  Laterality: N/A;  . Peripheral vascular catheterization N/A 02/07/2015    Procedure: Abdominal Aortogram w/Lower Extremity;  Surgeon: Katha Cabal, MD;  Location: [place] CV LAB;  Service: Cardiovascular;  Laterality: N/A;  . Peripheral vascular catheterization  02/07/2015    Procedure: Lower Extremity Intervention;  Surgeon: Katha Cabal, MD;  Location: [place] CV LAB;  Service: Cardiovascular;;  . Coronary angioplasty    . Esophagogastroduodenoscopy (egd) with propofol N/A 05/23/2015    Procedure: ESOPHAGOGASTRODUODENOSCOPY (EGD) WITH PROPOFOL;  Surgeon: Lucilla Lame, MD;  Location: [facility] ENDOSCOPY;  Service: Endoscopy;  Laterality: N/A;     Family History  Problem Relation Age of Onset  . Heart attack Mother   . Stroke Mother   . COPD Son   . Pneumonia Father      Social History   Social History  . Marital Status: Widowed    Spouse Name: N/A  . Number of Children: 1  . Years of Education: N/A   Occupational History  . Not on file.   Social History Main Topics  . Smoking status: Former Smoker -- 1.00 packs/day for 55 years    Types: Cigarettes  . Smokeless tobacco: Former Systems developer  . Alcohol Use: No  . Drug Use: No  . Sexual Activity: No   Other Topics Concern  . Not on file   Social History Narrative     PHYSICAL EXAM   Ht 5\' 7"  (1.702 m)  Wt 160 lb (72.576 kg)  BMI 25.05 kg/m2 Constitutional: She is oriented to person, place, and time. She appears well-developed and well-nourished. No distress.  HENT: No nasal discharge.  Head: Normocephalic and atraumatic.  Eyes: Pupils are equal and round.  Neck: Normal  range of motion. Neck supple. No JVD present. No thyromegaly present.  Cardiovascular: Normal rate, irregular rhythm, normal heart sounds. Exam reveals no gallop and no friction rub. No murmur heard.  Pulmonary/Chest: Effort normal . Very few crackles at the base. No stridor. No respiratory distress. She has no wheezes. She has no rales. She exhibits no tenderness.  Abdominal: Soft. Bowel sounds are normal. She exhibits no distension. There is no tenderness. There is no rebound and no guarding.  Musculoskeletal: Normal range of motion. She exhibits trace edema and no tenderness.  Neurological: She is alert and oriented to person, place, and time. Coordination  normal.  Skin: There is a small wound on the right shin which has healed nicely. No induration or warmth.  Psychiatric: She has a normal mood and affect. Her behavior is normal. Judgment and thought content normal.     EKG:  Atrial fibrillation  ABNORMAL RHYTHM   ASSESSMENT AND PLAN

## 2015-08-07 NOTE — Patient Instructions (Signed)
Medication Instructions: Continue same medications.   Labwork: None.   Procedures/Testing: None.   Follow-Up: 3 months with Dr. Zawadi Aplin.   Any Additional Special Instructions Will Be Listed Below (If Applicable).     If you need a refill on your cardiac medications before your next appointment, please call your pharmacy.   

## 2015-08-07 NOTE — Progress Notes (Signed)
Cardiology Office Note   Date:  08/07/2015   ID:  [name] [name], DOB June 28, 1922, MRN [id number]  PCP:  [name], MD  Cardiologist: Kathlyn Sacramento, MD   Chief Complaint  Patient presents with  . other    2 month follow up. Meds reviewed by the patient verbally. Pt. c/o chest heaviness and shortness of breath.       History of Present Illness: [name] [name] is a 82 y.o. female who presents for  a routine follow-up visit. She has known history of chronic systolic heart failure due to recurrent stress-induced cardiomyopathy,chronic atrial fibrillation, PAD and COPD. Previous cardiac catheterization in 2014 showed mild nonobstructive coronary artery disease. She had multiple hospitalizations for COPD exacerbations. She tends to improve with steroids.  She was hospitalized in January with the flu. She improved with treatment. She was discharged to short-term rehabilitation where she stayed for a few weeks. She is back home and feels better overall. Dyspnea is stable. No chest pain.    Past Medical History  Diagnosis Date  . HTN (hypertension)   . COPD (chronic obstructive pulmonary disease) (HCC)     a. on home O2 nightly.  . Diverticulosis   . Takotsubo cardiomyopathy 10/2012    a. 10/2012: presented w/ cardiogenic shock/systolic CHF/VDRF - EF 123456 by cath, 40% by f/u echo - cath with mild nonobstructive CAD. b. Not discharged on ACEI due to hypotension.  . Cardiogenic shock ([place])     a. 10/2012 - due to Takotsubo CM.  [name] Respiratory failure ([place])     a. On home O2 nightly. b. VDRF 10/2012 - due to CAP/pulm edema in setting of cardiogenic shock/Takotsubo CM.  [name] CAP (community acquired pneumonia)     a. 10/2012 - will need f/u CXR in June 2014 to ensure clearance.  . Esophageal stricture     a. s/p dilitation 10/2012.  [name] Pleural effusion, right     a. s/p thoracentesis 10/28/12.  . Atrial fibrillation ([place])     a. Dx 10/2012, rate controlled, started on Xarelto.  . Claudication  ([place])     a. ABI 10/2012 - R 0.87, L 0.74.  [name] GERD (gastroesophageal reflux disease)   . CKD (chronic kidney disease), stage III   . Breast CA (HCC)     hx  . Abdominal hernia     chronic  . STEMI (ST elevation myocardial infarction) ([place])   . Cellulitis   . CHF (congestive heart failure) ([place])   . CAD (coronary artery disease)     a. 10/2012 - presented w/ STEMI felt due to Takotsubo - mild nonobstructive dz by cath.    Past Surgical History  Procedure Laterality Date  . Mastectomy    . Cataract extraction    . Esophagogastroduodenoscopy N/A 11/05/2012    Procedure: ESOPHAGOGASTRODUODENOSCOPY (EGD) with esophageal savary dilatation;  Surgeon: Missy Sabins, MD;  Location: [place];  Service: Endoscopy;  Laterality: N/A;  savary dil...no floro needed  . Bladder surgery    . Left heart catheterization with coronary angiogram N/A 10/24/2012    Procedure: LEFT HEART CATHETERIZATION WITH CORONARY ANGIOGRAM;  Surgeon: Sherren Mocha, MD;  Location: [facility] CATH LAB;  Service: Cardiovascular;  Laterality: N/A;  . Peripheral vascular catheterization N/A 02/07/2015    Procedure: Abdominal Aortogram w/Lower Extremity;  Surgeon: Katha Cabal, MD;  Location: [place] CV LAB;  Service: Cardiovascular;  Laterality: N/A;  . Peripheral vascular catheterization  02/07/2015    Procedure: Lower Extremity  Intervention;  Surgeon: Katha Cabal, MD;  Location: [place] CV LAB;  Service: Cardiovascular;;  . Coronary angioplasty    . Esophagogastroduodenoscopy (egd) with propofol N/A 05/23/2015    Procedure: ESOPHAGOGASTRODUODENOSCOPY (EGD) WITH PROPOFOL;  Surgeon: Lucilla Lame, MD;  Location: [facility] ENDOSCOPY;  Service: Endoscopy;  Laterality: N/A;     Current Outpatient Prescriptions  Medication Sig Dispense Refill  . ADVAIR DISKUS 250-50 MCG/DOSE AEPB USE 1 (ONE) INHALATION, TWO TIMES DAILY. 60 each 5  . albuterol (PROVENTIL HFA;VENTOLIN HFA) 108 (90 BASE) MCG/ACT inhaler Inhale 2 puffs into  the lungs every 4 (four) hours as needed for wheezing or shortness of breath.    [name] albuterol (PROVENTIL) (2.5 MG/3ML) 0.083% nebulizer solution Take 2.5 mg by nebulization 4 (four) times daily as needed for wheezing or shortness of breath.    . carvedilol (COREG) 6.25 MG tablet Take 6.25 mg by mouth 2 (two) times daily with a meal.    . furosemide (LASIX) 40 MG tablet Take 80 mg by mouth 2 (two) times daily. 80 mg in am and 40 mg in pm    . loratadine (CLARITIN) 10 MG tablet Take 1 tablet (10 mg total) by mouth daily. 30 tablet 0  . Multiple Vitamin (MULTIVITAMIN WITH MINERALS) TABS tablet Take 1 tablet by mouth daily.    [name] omeprazole (PRILOSEC) 20 MG capsule Take 1 capsule (20 mg total) by mouth daily. 30 capsule 11  . pyridOXINE (VITAMIN B-6) 100 MG tablet Take 100 mg by mouth daily.    . ranitidine (ZANTAC) 150 MG tablet Take 150 mg by mouth at bedtime.     . Rivaroxaban (XARELTO) 15 MG TABS tablet Take 15 mg by mouth daily.     [name] tiotropium (SPIRIVA) 18 MCG inhalation capsule Place 18 mcg into inhaler and inhale daily.    . vitamin B-12 (CYANOCOBALAMIN) 1000 MCG tablet Take 1,000 mcg by mouth daily.     No current facility-administered medications for this visit.    Allergies:   Penicillins; Levaquin; and Sulfa antibiotics    Social History:  The patient  reports that she has quit smoking. Her smoking use included Cigarettes. She has a 55 pack-year smoking history. She has quit using smokeless tobacco. She reports that she does not drink alcohol or use illicit drugs.   Family History:  The patient's family history includes COPD in her son; Heart attack in her mother; Pneumonia in her father; Stroke in her mother.    ROS:  Please see the history of present illness.   Otherwise, review of systems are positive for none.   All other systems are reviewed and negative.    PHYSICAL EXAM: VS:  BP 100/54 mmHg  Pulse 75  Ht 5\' 7"  (1.702 m)  Wt 160 lb (72.576 kg)  BMI 25.05 kg/m2 , BMI Body  mass index is 25.05 kg/(m^2). GEN: Well nourished, well developed, in no acute distress HEENT: normal Neck: no JVD, carotid bruits, or masses Cardiac: Irregularly irregular; no murmurs, rubs, or gallops,no edema  Respiratory:  clear to auscultation bilaterally, normal work of breathing GI: soft, nontender, nondistended, + BS MS: no deformity or atrophy Skin: warm and dry, no rash Neuro:  Strength and sensation are intact Psych: euthymic mood, full affect   EKG:  EKG is ordered today.  The ekg ordered today demonstrates : Atrial fibrillation with no significant ST or T wave changes.   Recent Labs: 03/03/2015: ALT 13* 04/10/2015: B Natriuretic Peptide 185.0* 06/28/2015: BUN 24*; Creatinine, Ser 1.19*;  Hemoglobin 12.4; Platelets 161; Potassium 4.1; Sodium 137    Lipid Panel    Component Value Date/Time   CHOL 104 10/25/2012 0449   CHOL 67 03/28/2012 0239   TRIG 53 10/25/2012 0449   TRIG 41 03/28/2012 0239   HDL 66 10/25/2012 0449   HDL 27* 03/28/2012 0239   CHOLHDL 1.6 10/25/2012 0449   VLDL 11 10/25/2012 0449   VLDL 8 03/28/2012 0239   LDLCALC 27 10/25/2012 0449   LDLCALC 32 03/28/2012 0239      Wt Readings from Last 3 Encounters:  08/07/15 160 lb (72.576 kg)  08/03/15 160 lb (72.576 kg)  06/27/15 161 lb 4.8 oz (73.165 kg)         ASSESSMENT AND PLAN:  1.  Chronic systolic heart failure due to recurrent stress-induced cardiomyopathy. Most recent echocardiogram in November 2016 showed normal LV systolic function with borderline pulmonary hypertension. She appears to be euvolemic on current dose of furosemide. She is tolerating current dose of carvedilol with no side effects.  2. Chronic atrial fibrillation: Ventricular rate is well controlled on current dose of carvedilol. She is tolerating renal dose Xarelto with no side effects.  3. Essential hypertension: Blood pressure is controlled.   Current medicines are reviewed at length with the patient today.  The  patient does not have concerns regarding medicines.  The following changes have been made:  no change   Orders Placed This Encounter  Procedures  . EKG 12-Lead     Disposition:   FU with me in 3 month  Signed,  Kathlyn Sacramento, MD  08/07/2015 12:07 PM    [place]

## 2015-08-08 DIAGNOSIS — Z79899 Other long term (current) drug therapy: Secondary | ICD-10-CM | POA: Diagnosis not present

## 2015-08-08 DIAGNOSIS — I739 Peripheral vascular disease, unspecified: Secondary | ICD-10-CM | POA: Diagnosis not present

## 2015-08-08 DIAGNOSIS — I4891 Unspecified atrial fibrillation: Secondary | ICD-10-CM | POA: Diagnosis not present

## 2015-08-08 DIAGNOSIS — I5021 Acute systolic (congestive) heart failure: Secondary | ICD-10-CM | POA: Diagnosis not present

## 2015-08-08 DIAGNOSIS — N189 Chronic kidney disease, unspecified: Secondary | ICD-10-CM | POA: Diagnosis not present

## 2015-08-08 DIAGNOSIS — I252 Old myocardial infarction: Secondary | ICD-10-CM | POA: Diagnosis not present

## 2015-08-08 DIAGNOSIS — K219 Gastro-esophageal reflux disease without esophagitis: Secondary | ICD-10-CM | POA: Diagnosis not present

## 2015-08-08 DIAGNOSIS — I130 Hypertensive heart and chronic kidney disease with heart failure and stage 1 through stage 4 chronic kidney disease, or unspecified chronic kidney disease: Secondary | ICD-10-CM | POA: Diagnosis not present

## 2015-08-08 DIAGNOSIS — Z853 Personal history of malignant neoplasm of breast: Secondary | ICD-10-CM | POA: Diagnosis not present

## 2015-08-08 DIAGNOSIS — J441 Chronic obstructive pulmonary disease with (acute) exacerbation: Secondary | ICD-10-CM | POA: Diagnosis not present

## 2015-08-09 DIAGNOSIS — N189 Chronic kidney disease, unspecified: Secondary | ICD-10-CM | POA: Diagnosis not present

## 2015-08-09 DIAGNOSIS — I5021 Acute systolic (congestive) heart failure: Secondary | ICD-10-CM | POA: Diagnosis not present

## 2015-08-09 DIAGNOSIS — K219 Gastro-esophageal reflux disease without esophagitis: Secondary | ICD-10-CM | POA: Diagnosis not present

## 2015-08-09 DIAGNOSIS — J441 Chronic obstructive pulmonary disease with (acute) exacerbation: Secondary | ICD-10-CM | POA: Diagnosis not present

## 2015-08-09 DIAGNOSIS — Z853 Personal history of malignant neoplasm of breast: Secondary | ICD-10-CM | POA: Diagnosis not present

## 2015-08-09 DIAGNOSIS — I4891 Unspecified atrial fibrillation: Secondary | ICD-10-CM | POA: Diagnosis not present

## 2015-08-09 DIAGNOSIS — I739 Peripheral vascular disease, unspecified: Secondary | ICD-10-CM | POA: Diagnosis not present

## 2015-08-09 DIAGNOSIS — I130 Hypertensive heart and chronic kidney disease with heart failure and stage 1 through stage 4 chronic kidney disease, or unspecified chronic kidney disease: Secondary | ICD-10-CM | POA: Diagnosis not present

## 2015-08-09 DIAGNOSIS — Z79899 Other long term (current) drug therapy: Secondary | ICD-10-CM | POA: Diagnosis not present

## 2015-08-09 DIAGNOSIS — I252 Old myocardial infarction: Secondary | ICD-10-CM | POA: Diagnosis not present

## 2015-08-10 ENCOUNTER — Other Ambulatory Visit: Payer: Self-pay | Admitting: *Deleted

## 2015-08-10 ENCOUNTER — Encounter: Payer: Self-pay | Admitting: *Deleted

## 2015-08-10 ENCOUNTER — Other Ambulatory Visit: Payer: Self-pay | Admitting: Family Medicine

## 2015-08-10 DIAGNOSIS — I130 Hypertensive heart and chronic kidney disease with heart failure and stage 1 through stage 4 chronic kidney disease, or unspecified chronic kidney disease: Secondary | ICD-10-CM | POA: Diagnosis not present

## 2015-08-10 DIAGNOSIS — I739 Peripheral vascular disease, unspecified: Secondary | ICD-10-CM | POA: Diagnosis not present

## 2015-08-10 DIAGNOSIS — J441 Chronic obstructive pulmonary disease with (acute) exacerbation: Secondary | ICD-10-CM | POA: Diagnosis not present

## 2015-08-10 DIAGNOSIS — Z79899 Other long term (current) drug therapy: Secondary | ICD-10-CM | POA: Diagnosis not present

## 2015-08-10 DIAGNOSIS — I4891 Unspecified atrial fibrillation: Secondary | ICD-10-CM | POA: Diagnosis not present

## 2015-08-10 DIAGNOSIS — K219 Gastro-esophageal reflux disease without esophagitis: Secondary | ICD-10-CM | POA: Diagnosis not present

## 2015-08-10 DIAGNOSIS — Z853 Personal history of malignant neoplasm of breast: Secondary | ICD-10-CM | POA: Diagnosis not present

## 2015-08-10 DIAGNOSIS — I5021 Acute systolic (congestive) heart failure: Secondary | ICD-10-CM | POA: Diagnosis not present

## 2015-08-10 DIAGNOSIS — I252 Old myocardial infarction: Secondary | ICD-10-CM | POA: Diagnosis not present

## 2015-08-10 DIAGNOSIS — N189 Chronic kidney disease, unspecified: Secondary | ICD-10-CM | POA: Diagnosis not present

## 2015-08-10 NOTE — Patient Outreach (Signed)
[name]) Care Management   08/10/2015  [name] 24-May-1923 [id number]  [name] [name] is an 82 y.o. female  Subjective: " I had a really good day on yesterday, today not as good, getting tired easier after activity and my breathing is my usual shortness of breath"  Objective:  BP 118/62 mmHg  Pulse 65  Resp 20  SpO2 97% Review of Systems  Constitutional: Negative.   HENT: Negative.   Eyes: Negative.   Respiratory: Negative for cough and sputum production.        Slight expiratory wheeze  Cardiovascular: Positive for leg swelling.       1+ bilateral lower edema swelling, not increased per patient  Gastrointestinal: Negative.   Genitourinary: Negative.   Musculoskeletal: Negative.   Skin: Negative.   Neurological: Negative.   Psychiatric/Behavioral: Negative for depression.    Physical Exam  Constitutional: She is oriented to person, place, and time. She appears well-developed and well-nourished.  Cardiovascular: Normal rate.   Pulses:      Radial pulses are 2+ on the right side, and 2+ on the left side.       Dorsalis pedis pulses are 2+ on the right side, and 2+ on the left side.  Respiratory: No respiratory distress. She has wheezes.  Patient states at her normal short of breath especially with exertion. Slight expiratory wheeze  GI: Soft. Bowel sounds are normal.  Musculoskeletal: Normal range of motion.  Neurological: She is alert and oriented to person, place, and time.  Skin: Skin is warm, dry and intact.     Psychiatric: She has a normal mood and affect. Her behavior is normal. Judgment and thought content normal.    Current Medications:   Current Outpatient Prescriptions  Medication Sig Dispense Refill  . ADVAIR DISKUS 250-50 MCG/DOSE AEPB USE 1 (ONE) INHALATION, TWO TIMES DAILY. 60 each 5  . albuterol (PROVENTIL HFA;VENTOLIN HFA) 108 (90 BASE) MCG/ACT inhaler Inhale 2 puffs into the lungs every 4 (four) hours as needed for wheezing or  shortness of breath.    [name] [name] albuterol (PROVENTIL) (2.5 MG/3ML) 0.083% nebulizer solution INHALE THE CONTENTS OF 1 VIAL IN NEBULIZER 4 TIMES A DAY UNTIL FEELING BETTER THEN AS NEEDED 75 mL 11  . carvedilol (COREG) 6.25 MG tablet Take 6.25 mg by mouth 2 (two) times daily with a meal.    . furosemide (LASIX) 40 MG tablet Take 80 mg by mouth 2 (two) times daily. 80 mg in am and 40 mg in pm    . loratadine (CLARITIN) 10 MG tablet Take 1 tablet (10 mg total) by mouth daily. 30 tablet 0  . Multiple Vitamin (MULTIVITAMIN WITH MINERALS) TABS tablet Take 1 tablet by mouth daily.    [name] [name] omeprazole (PRILOSEC) 20 MG capsule Take 1 capsule (20 mg total) by mouth daily. 30 capsule 11  . pyridOXINE (VITAMIN B-6) 100 MG tablet Take 100 mg by mouth daily.    . ranitidine (ZANTAC) 150 MG tablet Take 150 mg by mouth at bedtime.     . Rivaroxaban (XARELTO) 15 MG TABS tablet Take 15 mg by mouth daily.     [name] [name] tiotropium (SPIRIVA) 18 MCG inhalation capsule Place 18 mcg into inhaler and inhale daily.    . vitamin B-12 (CYANOCOBALAMIN) 1000 MCG tablet Take 1,000 mcg by mouth daily.     No current facility-administered medications for this visit.    Functional Status:   In your present state of health, do you have any difficulty performing  the following activities: 08/01/2015 07/07/2015  Hearing? [place]? N -  Difficulty concentrating or making decisions? N -  Walking or climbing stairs? Y -  Dressing or bathing? N -  Doing errands, shopping? Y -  Conservation officer, nature and eating ? N N  Using the Toilet? N N  In the past six months, have you accidently leaked urine? Y Y  Do you have problems with loss of bowel control? N N  Managing your Medications? N N  Managing your Finances? N Y  Housekeeping or managing your Housekeeping? Y N   Fall Risk  08/10/2015 08/03/2015 07/07/2015 05/16/2015 04/24/2015  Falls in the past year? No No No No No  Risk for fall due to : - - - Medication side effect Impaired vision   Fall/Depression  Screening:    PHQ 2/9 Scores 08/10/2015 08/03/2015 07/07/2015 05/16/2015 04/24/2015 03/15/2015 12/28/2014  PHQ - 2 Score 0 0 0 0 0 0 0    Assessment:  Routine home visit, patient resting on sofa with oxygen at 3 liters continuous. Patient discussed her recent visit with PCP and Cardiologist. [name] [name] is still followed by [place] home health RN , that visited 3/1 and home health physical therapy visited on today, patient reports she was able to participate in therapy. Patient states good support from daughter in law, and friend that assist with transportation .   Heart Failure Patient reports she is weighing daily, but not recording states weight is staying around 160, noted swelling in lower extremities, but patient states it is not increased. SophiaGutierrez reports she is watching her salt, tries to eat fresh vegetables instead of canned, and does not add salt. Reviewed heart failure zones, yellow zone symptoms to take action and notify MD patient verbalized importance of following action plan  COPD Patient using oxygen at 3 liters nasal cannula continuous, patient report her PCP office if following up on getting her a smaller O2 tank for when she goes out, she states the small tank she has now is too heavy. Patient describes her breathing as her usual shortness of breath, reports she gives out easily,she does short activities such sweeping her [name] floor and had to rest. Patient reports taking her medications as prescribed, keeps her rescue inhaler with her, hasn't used it today, states she is using her albuterol nebulizer about 3 times today,her friend will pick up her albuterol refill  for nebulizer today from pharmacy. Reviewed with patient the zones of COPD and action plan for yellow zone, encouraged patient to call for worsening symptoms.   Plan:  Patient will continue with transition of care program, next call on 3/10 and next home visit, scheduled on 4/3 Patient will notify PCP of yellow zone   worsening symptoms of Heart Failure or COPD, and 911 for emergency red zone symptoms.  [facility] CM Care Plan Problem One        Most Recent Value   Care Plan Problem One  Patient    Role Documenting the Problem One  Care Management Coordinator   Care Plan for Problem One  Active   THN Long Term Goal (31-90 days)  Patient will not readmit to hospital in the next 31 days   THN Long Term Goal Start Date  07/27/15   Interventions for Problem One Long Term Goal  Reinforced on calling MD for worsening symptoms sooner to arrange office visit.   THN CM Short Term Goal #1 (0-30 days)  Patient will attend PCP appointment in  the next 7 days   THN CM Short Term Goal #1 Start Date  07/27/15   [facility] CM Short Term Goal #1 Met Date  08/10/15   THN CM Short Term Goal #2 (0-30 days)  Patient will weight daily and record in the next 30 days   THN CM Short Term Goal #2 Start Date  08/01/15   Interventions for Short Term Goal #2  Educated patient on how to use THN book to record weights, educated on the importance of tracking weights, to recognize small changes   THN CM Short Term Goal #3 (0-30 days)  Patient will report participating in physical therapy taught exercises at least 5 days a week at tolerated in the next 30 days   THN CM Short Term Goal #3 Start Date  08/10/15   Interventions for Short Tern Goal #3  Educated on the benefits of exercises to increase strength and balance   THN CM Short Term Goal #4 (0-30 days)  Patient will review and COPD zones and state 3 s/s of yellow zone and action plan in the next 30 days   THN CM Short Term Goal #4 Start Date  08/10/15   Interventions for Short Term Goal #4  Educated on COPD Zones in [facility] calendar, provided magnet for refrigerator   THN CM Short Term Goal #5 (0-30 days)  Patient will be able to state 2 s/s of Heart failure yellow zone symptoms and action plan in the next 30 days   THN CM Short Term Goal #5 Start Date  08/10/15   Interventions for Short Term Goal #5   Reeducated on Heart Failure zones, and actions to take       Joylene Draft, RN, [place] Management [phone]- Mobile [phone]- [place]

## 2015-08-11 DIAGNOSIS — I739 Peripheral vascular disease, unspecified: Secondary | ICD-10-CM | POA: Diagnosis not present

## 2015-08-11 DIAGNOSIS — Z853 Personal history of malignant neoplasm of breast: Secondary | ICD-10-CM | POA: Diagnosis not present

## 2015-08-11 DIAGNOSIS — I4891 Unspecified atrial fibrillation: Secondary | ICD-10-CM | POA: Diagnosis not present

## 2015-08-11 DIAGNOSIS — I252 Old myocardial infarction: Secondary | ICD-10-CM | POA: Diagnosis not present

## 2015-08-11 DIAGNOSIS — J441 Chronic obstructive pulmonary disease with (acute) exacerbation: Secondary | ICD-10-CM | POA: Diagnosis not present

## 2015-08-11 DIAGNOSIS — N189 Chronic kidney disease, unspecified: Secondary | ICD-10-CM | POA: Diagnosis not present

## 2015-08-11 DIAGNOSIS — Z79899 Other long term (current) drug therapy: Secondary | ICD-10-CM | POA: Diagnosis not present

## 2015-08-11 DIAGNOSIS — I5021 Acute systolic (congestive) heart failure: Secondary | ICD-10-CM | POA: Diagnosis not present

## 2015-08-11 DIAGNOSIS — I130 Hypertensive heart and chronic kidney disease with heart failure and stage 1 through stage 4 chronic kidney disease, or unspecified chronic kidney disease: Secondary | ICD-10-CM | POA: Diagnosis not present

## 2015-08-11 DIAGNOSIS — K219 Gastro-esophageal reflux disease without esophagitis: Secondary | ICD-10-CM | POA: Diagnosis not present

## 2015-08-14 ENCOUNTER — Other Ambulatory Visit: Payer: Self-pay

## 2015-08-14 ENCOUNTER — Other Ambulatory Visit: Payer: Self-pay | Admitting: Family Medicine

## 2015-08-14 DIAGNOSIS — J449 Chronic obstructive pulmonary disease, unspecified: Secondary | ICD-10-CM | POA: Diagnosis not present

## 2015-08-14 DIAGNOSIS — I252 Old myocardial infarction: Secondary | ICD-10-CM | POA: Diagnosis not present

## 2015-08-14 DIAGNOSIS — I4891 Unspecified atrial fibrillation: Secondary | ICD-10-CM | POA: Diagnosis not present

## 2015-08-14 DIAGNOSIS — Z79899 Other long term (current) drug therapy: Secondary | ICD-10-CM | POA: Diagnosis not present

## 2015-08-14 DIAGNOSIS — J441 Chronic obstructive pulmonary disease with (acute) exacerbation: Secondary | ICD-10-CM

## 2015-08-14 DIAGNOSIS — Z853 Personal history of malignant neoplasm of breast: Secondary | ICD-10-CM | POA: Diagnosis not present

## 2015-08-14 DIAGNOSIS — I130 Hypertensive heart and chronic kidney disease with heart failure and stage 1 through stage 4 chronic kidney disease, or unspecified chronic kidney disease: Secondary | ICD-10-CM | POA: Diagnosis not present

## 2015-08-14 DIAGNOSIS — N189 Chronic kidney disease, unspecified: Secondary | ICD-10-CM | POA: Diagnosis not present

## 2015-08-14 DIAGNOSIS — K219 Gastro-esophageal reflux disease without esophagitis: Secondary | ICD-10-CM | POA: Diagnosis not present

## 2015-08-14 DIAGNOSIS — J9621 Acute and chronic respiratory failure with hypoxia: Secondary | ICD-10-CM

## 2015-08-14 DIAGNOSIS — I739 Peripheral vascular disease, unspecified: Secondary | ICD-10-CM | POA: Diagnosis not present

## 2015-08-14 DIAGNOSIS — I5021 Acute systolic (congestive) heart failure: Secondary | ICD-10-CM | POA: Diagnosis not present

## 2015-08-15 DIAGNOSIS — I4891 Unspecified atrial fibrillation: Secondary | ICD-10-CM | POA: Diagnosis not present

## 2015-08-15 DIAGNOSIS — I739 Peripheral vascular disease, unspecified: Secondary | ICD-10-CM | POA: Diagnosis not present

## 2015-08-15 DIAGNOSIS — J441 Chronic obstructive pulmonary disease with (acute) exacerbation: Secondary | ICD-10-CM | POA: Diagnosis not present

## 2015-08-15 DIAGNOSIS — K219 Gastro-esophageal reflux disease without esophagitis: Secondary | ICD-10-CM | POA: Diagnosis not present

## 2015-08-15 DIAGNOSIS — N189 Chronic kidney disease, unspecified: Secondary | ICD-10-CM | POA: Diagnosis not present

## 2015-08-15 DIAGNOSIS — Z79899 Other long term (current) drug therapy: Secondary | ICD-10-CM | POA: Diagnosis not present

## 2015-08-15 DIAGNOSIS — Z853 Personal history of malignant neoplasm of breast: Secondary | ICD-10-CM | POA: Diagnosis not present

## 2015-08-15 DIAGNOSIS — I5021 Acute systolic (congestive) heart failure: Secondary | ICD-10-CM | POA: Diagnosis not present

## 2015-08-15 DIAGNOSIS — I130 Hypertensive heart and chronic kidney disease with heart failure and stage 1 through stage 4 chronic kidney disease, or unspecified chronic kidney disease: Secondary | ICD-10-CM | POA: Diagnosis not present

## 2015-08-15 DIAGNOSIS — I252 Old myocardial infarction: Secondary | ICD-10-CM | POA: Diagnosis not present

## 2015-08-17 ENCOUNTER — Telehealth: Payer: Self-pay | Admitting: Family Medicine

## 2015-08-17 DIAGNOSIS — I739 Peripheral vascular disease, unspecified: Secondary | ICD-10-CM | POA: Diagnosis not present

## 2015-08-17 DIAGNOSIS — I130 Hypertensive heart and chronic kidney disease with heart failure and stage 1 through stage 4 chronic kidney disease, or unspecified chronic kidney disease: Secondary | ICD-10-CM | POA: Diagnosis not present

## 2015-08-17 DIAGNOSIS — I252 Old myocardial infarction: Secondary | ICD-10-CM | POA: Diagnosis not present

## 2015-08-17 DIAGNOSIS — Z79899 Other long term (current) drug therapy: Secondary | ICD-10-CM | POA: Diagnosis not present

## 2015-08-17 DIAGNOSIS — I4891 Unspecified atrial fibrillation: Secondary | ICD-10-CM | POA: Diagnosis not present

## 2015-08-17 DIAGNOSIS — J441 Chronic obstructive pulmonary disease with (acute) exacerbation: Secondary | ICD-10-CM | POA: Diagnosis not present

## 2015-08-17 DIAGNOSIS — N189 Chronic kidney disease, unspecified: Secondary | ICD-10-CM | POA: Diagnosis not present

## 2015-08-17 DIAGNOSIS — Z853 Personal history of malignant neoplasm of breast: Secondary | ICD-10-CM | POA: Diagnosis not present

## 2015-08-17 DIAGNOSIS — I5021 Acute systolic (congestive) heart failure: Secondary | ICD-10-CM | POA: Diagnosis not present

## 2015-08-17 DIAGNOSIS — K219 Gastro-esophageal reflux disease without esophagitis: Secondary | ICD-10-CM | POA: Diagnosis not present

## 2015-08-17 MED ORDER — ALBUTEROL SULFATE HFA 108 (90 BASE) MCG/ACT IN AERS: 1.0000 | INHALATION_SPRAY | Freq: Four times a day (QID) | RESPIRATORY_TRACT | Status: AC | PRN

## 2015-08-17 NOTE — Telephone Encounter (Signed)
Pt. Called request a refill on albuterol. Pt. Call back # is [phone]

## 2015-08-18 ENCOUNTER — Other Ambulatory Visit: Payer: Self-pay | Admitting: *Deleted

## 2015-08-18 DIAGNOSIS — Z853 Personal history of malignant neoplasm of breast: Secondary | ICD-10-CM | POA: Diagnosis not present

## 2015-08-18 DIAGNOSIS — I130 Hypertensive heart and chronic kidney disease with heart failure and stage 1 through stage 4 chronic kidney disease, or unspecified chronic kidney disease: Secondary | ICD-10-CM | POA: Diagnosis not present

## 2015-08-18 DIAGNOSIS — N189 Chronic kidney disease, unspecified: Secondary | ICD-10-CM | POA: Diagnosis not present

## 2015-08-18 DIAGNOSIS — I4891 Unspecified atrial fibrillation: Secondary | ICD-10-CM | POA: Diagnosis not present

## 2015-08-18 DIAGNOSIS — I5021 Acute systolic (congestive) heart failure: Secondary | ICD-10-CM | POA: Diagnosis not present

## 2015-08-18 DIAGNOSIS — Z79899 Other long term (current) drug therapy: Secondary | ICD-10-CM | POA: Diagnosis not present

## 2015-08-18 DIAGNOSIS — J441 Chronic obstructive pulmonary disease with (acute) exacerbation: Secondary | ICD-10-CM | POA: Diagnosis not present

## 2015-08-18 DIAGNOSIS — K219 Gastro-esophageal reflux disease without esophagitis: Secondary | ICD-10-CM | POA: Diagnosis not present

## 2015-08-18 DIAGNOSIS — I252 Old myocardial infarction: Secondary | ICD-10-CM | POA: Diagnosis not present

## 2015-08-18 DIAGNOSIS — I739 Peripheral vascular disease, unspecified: Secondary | ICD-10-CM | POA: Diagnosis not present

## 2015-08-18 NOTE — Patient Outreach (Signed)
[facility]) Care Management  08/18/2015  [name] [name] 1922/06/27 [id number]   Transition of care call RNCM placed call to patient as part of transition of care program, Mrs.Karras reports she has had some increased shortness of breath on today, but she is taking her nebulizer treatments as prescribed,wearing her oxygen continuously  and feeling some better, and she doesn't feel the need to notify MD,  reinforced with patient importance  to seek medical attention for increased breathing difficulty, patient states she  Is aware and  will follow through . Patient denies increased in swelling or weight gain.   Plan Patient will seek medical attention for unrelieved, or worsening respiratory problems. RNCM will meet for community home visit on 4/3.  [facility] CM Care Plan Problem One        Most Recent Value   Care Plan Problem One  Patient    Role Documenting the Problem One  Care Management Coordinator   Care Plan for Problem One  Active   THN Long Term Goal (31-90 days)  Patient will not readmit to hospital in the next 31 days   THN Long Term Goal Start Date  07/27/15   Interventions for Problem One Long Term Goal  Reviewed importance of calling MD for worsening symptoms sooner to arrange office visit.   THN CM Short Term Goal #1 (0-30 days)  Patient will attend PCP appointment in the next 7 days   THN CM Short Term Goal #1 Start Date  07/27/15   [facility] CM Short Term Goal #1 Met Date  08/10/15   [facility]N CM Short Term Goal #2 (0-30 days)  Patient will weight daily and record in the next 30 days   THN CM Short Term Goal #2 Start Date  08/01/15   [facility] CM Short Term Goal #2 Met Date  08/18/15   THN CM Short Term Goal #3 (0-30 days)  Patient will report participating in physical therapy taught exercises at least 5 days a week at tolerated in the next 30 days   [facility]N CM Short Term Goal #3 Start Date  08/10/15   Interventions for Short Tern Goal #3  Educated on the benefits of exercises to  increase strength and balance   THN CM Short Term Goal #4 (0-30 days)  Patient will review and COPD zones and state 3 s/s of yellow zone and action plan in the next 30 days   THN CM Short Term Goal #4 Start Date  08/10/15   Interventions for Short Term Goal #4  Reviewed action plan for symptoms in yellow zone and importance of seeking medical attention if symptoms not relieved by interventions   THN CM Short Term Goal #5 (0-30 days)  Patient will be able to state 2 s/s of Heart failure yellow zone symptoms and action plan in the next 30 days   [facility]N CM Short Term Goal #5 Start Date  08/10/15   Interventions for Short Term Goal #5  Reviewed with patient to notify MD for symptoms, prior to reaching red zone., take medications as prescribed, limit salt in diet.      Joylene Draft, RN, [facility] [phone]- Mobile [phone]- Toll Free Main Office

## 2015-08-19 DIAGNOSIS — J449 Chronic obstructive pulmonary disease, unspecified: Secondary | ICD-10-CM | POA: Diagnosis not present

## 2015-08-22 DIAGNOSIS — K219 Gastro-esophageal reflux disease without esophagitis: Secondary | ICD-10-CM | POA: Diagnosis not present

## 2015-08-22 DIAGNOSIS — I252 Old myocardial infarction: Secondary | ICD-10-CM | POA: Diagnosis not present

## 2015-08-22 DIAGNOSIS — I4891 Unspecified atrial fibrillation: Secondary | ICD-10-CM | POA: Diagnosis not present

## 2015-08-22 DIAGNOSIS — I130 Hypertensive heart and chronic kidney disease with heart failure and stage 1 through stage 4 chronic kidney disease, or unspecified chronic kidney disease: Secondary | ICD-10-CM | POA: Diagnosis not present

## 2015-08-22 DIAGNOSIS — I739 Peripheral vascular disease, unspecified: Secondary | ICD-10-CM | POA: Diagnosis not present

## 2015-08-22 DIAGNOSIS — Z853 Personal history of malignant neoplasm of breast: Secondary | ICD-10-CM | POA: Diagnosis not present

## 2015-08-22 DIAGNOSIS — N189 Chronic kidney disease, unspecified: Secondary | ICD-10-CM | POA: Diagnosis not present

## 2015-08-22 DIAGNOSIS — Z79899 Other long term (current) drug therapy: Secondary | ICD-10-CM | POA: Diagnosis not present

## 2015-08-22 DIAGNOSIS — I5021 Acute systolic (congestive) heart failure: Secondary | ICD-10-CM | POA: Diagnosis not present

## 2015-08-22 DIAGNOSIS — J441 Chronic obstructive pulmonary disease with (acute) exacerbation: Secondary | ICD-10-CM | POA: Diagnosis not present

## 2015-08-23 ENCOUNTER — Encounter: Payer: Self-pay | Admitting: Emergency Medicine

## 2015-08-23 ENCOUNTER — Inpatient Hospital Stay
Admission: EM | Admit: 2015-08-23 | Discharge: 2015-08-25 | DRG: 190 | Disposition: A | Discharge status: Home Health | Payer: Medicare Other | Attending: Specialist | Admitting: Specialist

## 2015-08-23 ENCOUNTER — Telehealth: Payer: Self-pay | Admitting: Family Medicine

## 2015-08-23 ENCOUNTER — Emergency Department: Payer: Medicare Other

## 2015-08-23 DIAGNOSIS — J9620 Acute and chronic respiratory failure, unspecified whether with hypoxia or hypercapnia: Secondary | ICD-10-CM | POA: Diagnosis not present

## 2015-08-23 DIAGNOSIS — I252 Old myocardial infarction: Secondary | ICD-10-CM | POA: Diagnosis not present

## 2015-08-23 DIAGNOSIS — R0600 Dyspnea, unspecified: Secondary | ICD-10-CM | POA: Diagnosis not present

## 2015-08-23 DIAGNOSIS — Z7951 Long term (current) use of inhaled steroids: Secondary | ICD-10-CM | POA: Diagnosis not present

## 2015-08-23 DIAGNOSIS — I4891 Unspecified atrial fibrillation: Secondary | ICD-10-CM | POA: Diagnosis not present

## 2015-08-23 DIAGNOSIS — Z9981 Dependence on supplemental oxygen: Secondary | ICD-10-CM | POA: Diagnosis not present

## 2015-08-23 DIAGNOSIS — I2510 Atherosclerotic heart disease of native coronary artery without angina pectoris: Secondary | ICD-10-CM | POA: Diagnosis not present

## 2015-08-23 DIAGNOSIS — J9691 Respiratory failure, unspecified with hypoxia: Secondary | ICD-10-CM | POA: Diagnosis present

## 2015-08-23 DIAGNOSIS — I482 Chronic atrial fibrillation: Secondary | ICD-10-CM | POA: Diagnosis present

## 2015-08-23 DIAGNOSIS — Z880 Allergy status to penicillin: Secondary | ICD-10-CM

## 2015-08-23 DIAGNOSIS — J441 Chronic obstructive pulmonary disease with (acute) exacerbation: Secondary | ICD-10-CM | POA: Diagnosis not present

## 2015-08-23 DIAGNOSIS — Z853 Personal history of malignant neoplasm of breast: Secondary | ICD-10-CM

## 2015-08-23 DIAGNOSIS — R0602 Shortness of breath: Secondary | ICD-10-CM | POA: Diagnosis not present

## 2015-08-23 DIAGNOSIS — I739 Peripheral vascular disease, unspecified: Secondary | ICD-10-CM | POA: Diagnosis not present

## 2015-08-23 DIAGNOSIS — Z79899 Other long term (current) drug therapy: Secondary | ICD-10-CM

## 2015-08-23 DIAGNOSIS — Z87891 Personal history of nicotine dependence: Secondary | ICD-10-CM | POA: Diagnosis not present

## 2015-08-23 DIAGNOSIS — Z7901 Long term (current) use of anticoagulants: Secondary | ICD-10-CM

## 2015-08-23 DIAGNOSIS — I5033 Acute on chronic diastolic (congestive) heart failure: Secondary | ICD-10-CM | POA: Diagnosis present

## 2015-08-23 DIAGNOSIS — Z881 Allergy status to other antibiotic agents status: Secondary | ICD-10-CM | POA: Diagnosis not present

## 2015-08-23 DIAGNOSIS — K219 Gastro-esophageal reflux disease without esophagitis: Secondary | ICD-10-CM | POA: Diagnosis present

## 2015-08-23 DIAGNOSIS — N183 Chronic kidney disease, stage 3 (moderate): Secondary | ICD-10-CM | POA: Diagnosis not present

## 2015-08-23 DIAGNOSIS — I509 Heart failure, unspecified: Secondary | ICD-10-CM | POA: Diagnosis not present

## 2015-08-23 DIAGNOSIS — J9621 Acute and chronic respiratory failure with hypoxia: Secondary | ICD-10-CM | POA: Diagnosis present

## 2015-08-23 DIAGNOSIS — Z882 Allergy status to sulfonamides status: Secondary | ICD-10-CM | POA: Diagnosis not present

## 2015-08-23 DIAGNOSIS — I130 Hypertensive heart and chronic kidney disease with heart failure and stage 1 through stage 4 chronic kidney disease, or unspecified chronic kidney disease: Secondary | ICD-10-CM | POA: Diagnosis present

## 2015-08-23 DIAGNOSIS — J449 Chronic obstructive pulmonary disease, unspecified: Secondary | ICD-10-CM | POA: Diagnosis not present

## 2015-08-23 LAB — CBC WITH DIFFERENTIAL/PLATELET
BASOS ABS: 0 10*3/uL (ref 0–0.1)
Basophils Relative: 1 %
Eosinophils Absolute: 0.1 10*3/uL (ref 0–0.7)
Eosinophils Relative: 3 %
HEMATOCRIT: 34.7 % — AB (ref 35.0–47.0)
HEMOGLOBIN: 11.5 g/dL — AB (ref 12.0–16.0)
LYMPHS PCT: 29 %
Lymphs Abs: 1.5 10*3/uL (ref 1.0–3.6)
MCH: 31.5 pg (ref 26.0–34.0)
MCHC: 33.1 g/dL (ref 32.0–36.0)
MCV: 95.2 fL (ref 80.0–100.0)
MONO ABS: 0.6 10*3/uL (ref 0.2–0.9)
Monocytes Relative: 11 %
NEUTROS ABS: 2.9 10*3/uL (ref 1.4–6.5)
NEUTROS PCT: 56 %
Platelets: 202 10*3/uL (ref 150–440)
RBC: 3.65 MIL/uL — AB (ref 3.80–5.20)
RDW: 14.7 % — AB (ref 11.5–14.5)
WBC: 5.2 10*3/uL (ref 3.6–11.0)

## 2015-08-23 LAB — COMPREHENSIVE METABOLIC PANEL
ALBUMIN: 3.9 g/dL (ref 3.5–5.0)
ALK PHOS: 65 U/L (ref 38–126)
ALT: 11 U/L — AB (ref 14–54)
ANION GAP: 7 (ref 5–15)
AST: 23 U/L (ref 15–41)
BUN: 15 mg/dL (ref 6–20)
CALCIUM: 8.9 mg/dL (ref 8.9–10.3)
CO2: 36 mmol/L — AB (ref 22–32)
Chloride: 95 mmol/L — ABNORMAL LOW (ref 101–111)
Creatinine, Ser: 1.04 mg/dL — ABNORMAL HIGH (ref 0.44–1.00)
GFR calc Af Amer: 53 mL/min — ABNORMAL LOW (ref >60)
GFR calc non Af Amer: 46 mL/min — ABNORMAL LOW (ref >60)
GLUCOSE: 185 mg/dL — AB (ref 65–99)
Potassium: 3.4 mmol/L — ABNORMAL LOW (ref 3.5–5.1)
SODIUM: 138 mmol/L (ref 135–145)
Total Bilirubin: 0.3 mg/dL (ref 0.3–1.2)
Total Protein: 7.1 g/dL (ref 6.5–8.1)

## 2015-08-23 LAB — TROPONIN I
Troponin I: <0.03 ng/mL (ref <0.031)
Troponin I: <0.03 ng/mL (ref <0.031)

## 2015-08-23 LAB — BRAIN NATRIURETIC PEPTIDE: B Natriuretic Peptide: 214 pg/mL — ABNORMAL HIGH (ref 0.0–100.0)

## 2015-08-23 LAB — LACTIC ACID, PLASMA
LACTIC ACID, VENOUS: 1.4 mmol/L (ref 0.5–2.0)
Lactic Acid, Venous: 1.9 mmol/L (ref 0.5–2.0)

## 2015-08-23 MED ORDER — FUROSEMIDE 10 MG/ML IJ SOLN
80.0000 mg | Freq: Once | INTRAMUSCULAR | Status: AC
  Administered 2015-08-23: 80 mg via INTRAVENOUS
  Filled 2015-08-23: qty 8

## 2015-08-23 NOTE — ED Notes (Addendum)
Pt reports being short of breath and swelling in bilat feet and legs - Pt called her primary provider and he told her to come to the ER - Pt has +3 pitting edema in bilat ext and feet - Bilat pedal pulses equal/faint

## 2015-08-23 NOTE — Telephone Encounter (Signed)
Called pt she is feeling funny breaking out in Sweats in whole body SOB from week but no chest pain or dizziness but her legs and feet are  Swollen she doesn't think she has sinus infection but don't know for sure I advised her that we will find our from Dr. Luan Pulling but meanwhile if her Sx gets worst please go to ER or urgent care [facility]

## 2015-08-23 NOTE — Telephone Encounter (Signed)
Pt is having swelling in feet and legs and is having trouble breathing.  Please call [phone]

## 2015-08-23 NOTE — Telephone Encounter (Signed)
Patient needs ER evaluation today.-jh

## 2015-08-23 NOTE — ED Provider Notes (Addendum)
[facility] Emergency Department Provider Note  ____________________________________________  Time seen: Approximately 6:55 PM  I have reviewed the triage vital signs and the nursing notes.   HISTORY  Chief Complaint Shortness of Breath    HPI [name] [name] is a 82 y.o. female who complains of chronic shortness of breath and is usually on at least 2-1/2 L of oxygen at home. However the shortness of breath has gotten worse in the last week patient reports increased swelling in her legs. No fever no productive cough no other complaints. Shortness of breath appears to be moderate in nature   Past Medical History  Diagnosis Date  . HTN (hypertension)   . COPD (chronic obstructive pulmonary disease) (HCC)     a. on home O2 nightly.  . Diverticulosis   . Takotsubo cardiomyopathy 10/2012    a. 10/2012: presented w/ cardiogenic shock/systolic CHF/VDRF - EF [id number] by cath, 40% by f/u echo - cath with mild nonobstructive CAD. b. Not discharged on ACEI due to hypotension.  . Cardiogenic shock ([place])     a. 10/2012 - due to Takotsubo CM.  [name] Respiratory failure ([place])     a. On home O2 nightly. b. VDRF 10/2012 - due to CAP/pulm edema in setting of cardiogenic shock/Takotsubo CM.  [name] CAP (community acquired pneumonia)     a. 10/2012 - will need f/u CXR in June 2014 to ensure clearance.  . Esophageal stricture     a. s/p dilitation 10/2012.  [name] Pleural effusion, right     a. s/p thoracentesis 10/28/12.  . Atrial fibrillation ([place])     a. Dx 10/2012, rate controlled, started on Xarelto.  . Claudication ([place])     a. ABI 10/2012 - R 0.87, L 0.74.  [name] GERD (gastroesophageal reflux disease)   . CKD (chronic kidney disease), stage III   . Breast CA (HCC)     hx  . Abdominal hernia     chronic  . STEMI (ST elevation myocardial infarction) ([place])   . Cellulitis   . CHF (congestive heart failure) ([place])   . CAD (coronary artery disease)     a. 10/2012 - presented w/ STEMI felt due to  Takotsubo - mild nonobstructive dz by cath.    Patient Active Problem List   Diagnosis Date Noted  . COPD with acute exacerbation ([place]) 06/27/2015  . Problems with swallowing and mastication   . Hiatal hernia   . Stricture and stenosis of esophagus   . Respiratory failure with hypoxia ([place]) 04/10/2015  . Diverticulitis large intestine 03/28/2015  . Acute on chronic respiratory failure with hypoxemia ([place]) 03/03/2015  . Essential hypertension 04/08/2014  . Chronic systolic heart failure ([place]) 04/30/2013  . PAD (peripheral artery disease) ([place]) 12/07/2012  . Atrial fibrillation ([place]) 10/31/2012  . Pleural effusion 10/29/2012  . Acute systolic CHF (congestive heart failure) ([place]) 10/28/2012  . Acute respiratory failure ([place]) 10/28/2012  . Hypokalemia 10/28/2012  . Cardiogenic shock ([place]) 10/28/2012  . GERD (gastroesophageal reflux disease) 10/28/2012  . Esophageal stricture 10/28/2012  . Community acquired pneumonia 10/26/2012  . Takotsubo syndrome 10/25/2012    Past Surgical History  Procedure Laterality Date  . Mastectomy    . Cataract extraction    . Esophagogastroduodenoscopy N/A 11/05/2012    Procedure: ESOPHAGOGASTRODUODENOSCOPY (EGD) with esophageal savary dilatation;  Surgeon: Missy Sabins, MD;  Location: [facility];  Service: Endoscopy;  Laterality: N/A;  savary dil...no floro needed  . Bladder surgery    . Left heart catheterization  with coronary angiogram N/A 10/24/2012    Procedure: LEFT HEART CATHETERIZATION WITH CORONARY ANGIOGRAM;  Surgeon: Sherren Mocha, MD;  Location: [facility] CATH LAB;  Service: Cardiovascular;  Laterality: N/A;  . Peripheral vascular catheterization N/A 02/07/2015    Procedure: Abdominal Aortogram w/Lower Extremity;  Surgeon: Katha Cabal, MD;  Location: [facility] CV LAB;  Service: Cardiovascular;  Laterality: N/A;  . Peripheral vascular catheterization  02/07/2015    Procedure: Lower Extremity Intervention;  Surgeon: Katha Cabal, MD;   Location: [facility] CV LAB;  Service: Cardiovascular;;  . Coronary angioplasty    . Esophagogastroduodenoscopy (egd) with propofol N/A 05/23/2015    Procedure: ESOPHAGOGASTRODUODENOSCOPY (EGD) WITH PROPOFOL;  Surgeon: Lucilla Lame, MD;  Location: [facility] ENDOSCOPY;  Service: Endoscopy;  Laterality: N/A;    Current Outpatient Rx  Name  Route  Sig  Dispense  Refill  . albuterol (PROVENTIL HFA;VENTOLIN HFA) 108 (90 Base) MCG/ACT inhaler   Inhalation   Inhale 1-2 puffs into the lungs every 6 (six) hours as needed for wheezing or shortness of breath.   18 g   3   . albuterol (PROVENTIL) (2.5 MG/3ML) 0.083% nebulizer solution   Nebulization   Take 2.5 mg by nebulization every 6 (six) hours as needed for wheezing or shortness of breath.         . carvedilol (COREG) 6.25 MG tablet   Oral   Take 6.25 mg by mouth 2 (two) times daily with a meal.         . Fluticasone-Salmeterol (ADVAIR) 250-50 MCG/DOSE AEPB   Inhalation   Inhale 1 puff into the lungs 2 (two) times daily.         . furosemide (LASIX) 40 MG tablet   Oral   Take 40-80 mg by mouth 2 (two) times daily. Pt takes two tablets in the morning and one tablet in the evening.         . loratadine (CLARITIN) 10 MG tablet   Oral   Take 1 tablet (10 mg total) by mouth daily.   30 tablet   0   . Multiple Vitamin (MULTIVITAMIN WITH MINERALS) TABS tablet   Oral   Take 1 tablet by mouth daily.          [name] omeprazole (PRILOSEC) 20 MG capsule   Oral   Take 1 capsule (20 mg total) by mouth daily.   30 capsule   11   . ranitidine (ZANTAC) 150 MG tablet   Oral   Take 150 mg by mouth at bedtime.          . Rivaroxaban (XARELTO) 15 MG TABS tablet   Oral   Take 15 mg by mouth every evening.          . tiotropium (SPIRIVA) 18 MCG inhalation capsule   Inhalation   Place 18 mcg into inhaler and inhale daily.         . vitamin B-12 (CYANOCOBALAMIN) 1000 MCG tablet   Oral   Take 1,000 mcg by mouth daily.            Allergies Penicillins; Levofloxacin; and Sulfa antibiotics  Family History  Problem Relation Age of Onset  . Heart attack Mother   . Stroke Mother   . COPD Son   . Pneumonia Father     Social History Social History  Substance Use Topics  . Smoking status: Former Smoker -- 1.00 packs/day for 55 years    Types: Cigarettes  . Smokeless tobacco: Former Systems developer  . Alcohol  Use: No    Review of Systems Constitutional: No fever/chills Eyes: No visual changes. ENT: No sore throat. Cardiovascular: Denies chest pain. Respiratory: See history of present illness Gastrointestinal: Patient has chronic abdominal pain from an hiatal hernia. This may have been worse the last few days.  No nausea, no vomiting.  No diarrhea.  No constipation. Genitourinary: Negative for dysuria. Musculoskeletal: Negative for back pain. Skin: Negative for rash. Neurological: Negative for headaches, focal weakness or numbness.  10-point ROS otherwise negative.  ____________________________________________   PHYSICAL EXAM:  VITAL SIGNS: ED Triage Vitals  Enc Vitals Group     BP 08/23/15 1816 138/54 mmHg     Pulse Rate 08/23/15 1816 77     Resp 08/23/15 1816 18     Temp 08/23/15 1816 97.5 F (36.4 C)     Temp Source 08/23/15 1816 Oral     SpO2 08/23/15 1816 88 %     Weight 08/23/15 1816 160 lb (72.576 kg)     Height 08/23/15 1816 5\' 7"  (1.702 m)     Head Cir --      Peak Flow --      Pain Score 08/23/15 1817 0     Pain Loc --      Pain Edu? --      Excl. in [place]? --     Constitutional: Alert and oriented. Well appearing and in no acute distress. Eyes: Conjunctivae are normal. PERRL. EOMI. Head: Atraumatic. Nose: No congestion/rhinnorhea. Mouth/Throat: Mucous membranes are moist.  Oropharynx non-erythematous. Neck: No stridor.   Cardiovascular: Normal rate, regular rhythm. Grossly normal heart sounds.  Good peripheral circulation. Respiratory: Normal respiratory effort.  No retractions.  Lungs CTAB. Gastrointestinal: Soft and nontender. No distention. No abdominal bruits. No CVA tenderness. Musculoskeletal: No lower extremity tenderness patient does have bilateral edema which she reports was present in the past but she has not had for some time.  No joint effusions. Neurologic:  Normal speech and language. No gross focal neurologic deficits are appreciated. No gait instability. Skin:  Skin is warm, dry and intact. No rash noted. Psychiatric: Mood and affect are normal. Speech and behavior are normal.  ____________________________________________   LABS (all labs ordered are listed, but only abnormal results are displayed)  Labs Reviewed  COMPREHENSIVE METABOLIC PANEL - Abnormal; Notable for the following:    Potassium 3.4 (*)    Chloride 95 (*)    CO2 36 (*)    Glucose, Bld 185 (*)    Creatinine, Ser 1.04 (*)    ALT 11 (*)    GFR calc non Af Amer 46 (*)    GFR calc Af Amer 53 (*)    All other components within normal limits  CBC WITH DIFFERENTIAL/PLATELET - Abnormal; Notable for the following:    RBC 3.65 (*)    Hemoglobin 11.5 (*)    HCT 34.7 (*)    RDW 14.7 (*)    All other components within normal limits  BRAIN NATRIURETIC PEPTIDE - Abnormal; Notable for the following:    B Natriuretic Peptide 214.0 (*)    All other components within normal limits  TROPONIN I  LACTIC ACID, PLASMA  LACTIC ACID, PLASMA  TROPONIN I   ____________________________________________  EKG  EKG shows what appears to be atrial fibrillation with a rate of 77 normal axis no acute ST-T changes ____________________________________________  RADIOLOGY  Chest x-ray shows COPD and mild CHF ____________________________________________   PROCEDURES Patient maintains good O2 sats on her usual 2-1/2 L  ____________________________________________  INITIAL IMPRESSION / ASSESSMENT AND PLAN / ED COURSE  Pertinent labs & imaging results that were available during my care of the  patient were reviewed by me and considered in my medical decision making (see chart for details).  Patient will be signed out to Dr. Edd Fabian ____________________________________________   FINAL CLINICAL IMPRESSION(S) / ED DIAGNOSES  Final diagnoses:  Dyspnea      Nena Polio, MD 08/23/15 2151  After Lasix patient only urinates once. Patient is unable to lay at less than 45 before she gets dyspneic and her O2 sats started dropping from 98 and through below 95. She is so uncomfortable that I sit her up before her O2 sats go any lower. Patient's edema has not changed. We will put the patient in the hospital for at least observation to diuresis her and improve her  Nena Polio, MD 08/23/15 2237

## 2015-08-23 NOTE — ED Notes (Addendum)
C/o sob and swelling in ankles.  No resp distress at this time

## 2015-08-24 DIAGNOSIS — I2510 Atherosclerotic heart disease of native coronary artery without angina pectoris: Secondary | ICD-10-CM | POA: Diagnosis present

## 2015-08-24 DIAGNOSIS — Z9981 Dependence on supplemental oxygen: Secondary | ICD-10-CM | POA: Diagnosis not present

## 2015-08-24 DIAGNOSIS — I5033 Acute on chronic diastolic (congestive) heart failure: Secondary | ICD-10-CM | POA: Diagnosis present

## 2015-08-24 DIAGNOSIS — K219 Gastro-esophageal reflux disease without esophagitis: Secondary | ICD-10-CM | POA: Diagnosis present

## 2015-08-24 DIAGNOSIS — R0600 Dyspnea, unspecified: Secondary | ICD-10-CM | POA: Diagnosis present

## 2015-08-24 DIAGNOSIS — Z882 Allergy status to sulfonamides status: Secondary | ICD-10-CM | POA: Diagnosis not present

## 2015-08-24 DIAGNOSIS — J441 Chronic obstructive pulmonary disease with (acute) exacerbation: Secondary | ICD-10-CM | POA: Diagnosis present

## 2015-08-24 DIAGNOSIS — I482 Chronic atrial fibrillation: Secondary | ICD-10-CM | POA: Diagnosis present

## 2015-08-24 DIAGNOSIS — Z7951 Long term (current) use of inhaled steroids: Secondary | ICD-10-CM | POA: Diagnosis not present

## 2015-08-24 DIAGNOSIS — Z79899 Other long term (current) drug therapy: Secondary | ICD-10-CM | POA: Diagnosis not present

## 2015-08-24 DIAGNOSIS — N183 Chronic kidney disease, stage 3 (moderate): Secondary | ICD-10-CM | POA: Diagnosis present

## 2015-08-24 DIAGNOSIS — Z853 Personal history of malignant neoplasm of breast: Secondary | ICD-10-CM | POA: Diagnosis not present

## 2015-08-24 DIAGNOSIS — J9621 Acute and chronic respiratory failure with hypoxia: Secondary | ICD-10-CM | POA: Diagnosis present

## 2015-08-24 DIAGNOSIS — I739 Peripheral vascular disease, unspecified: Secondary | ICD-10-CM | POA: Diagnosis present

## 2015-08-24 DIAGNOSIS — I130 Hypertensive heart and chronic kidney disease with heart failure and stage 1 through stage 4 chronic kidney disease, or unspecified chronic kidney disease: Secondary | ICD-10-CM | POA: Diagnosis present

## 2015-08-24 DIAGNOSIS — Z87891 Personal history of nicotine dependence: Secondary | ICD-10-CM | POA: Diagnosis not present

## 2015-08-24 DIAGNOSIS — Z881 Allergy status to other antibiotic agents status: Secondary | ICD-10-CM | POA: Diagnosis not present

## 2015-08-24 DIAGNOSIS — I252 Old myocardial infarction: Secondary | ICD-10-CM | POA: Diagnosis not present

## 2015-08-24 DIAGNOSIS — Z880 Allergy status to penicillin: Secondary | ICD-10-CM | POA: Diagnosis not present

## 2015-08-24 DIAGNOSIS — Z7901 Long term (current) use of anticoagulants: Secondary | ICD-10-CM | POA: Diagnosis not present

## 2015-08-24 LAB — HEMOGLOBIN A1C: Hgb A1c MFr Bld: 7.1 % — ABNORMAL HIGH (ref 4.0–6.0)

## 2015-08-24 LAB — TSH: TSH: 1.297 u[IU]/mL (ref 0.350–4.500)

## 2015-08-24 MED ORDER — POTASSIUM CHLORIDE IN NACL 40-0.9 MEQ/L-% IV SOLN
INTRAVENOUS | Status: DC
  Administered 2015-08-24: 100 mL/h via INTRAVENOUS
  Filled 2015-08-24 (×3): qty 1000

## 2015-08-24 MED ORDER — PREDNISONE 20 MG PO TABS
20.0000 mg | ORAL_TABLET | Freq: Every day | ORAL | Status: DC
Start: 2015-08-27 — End: 2015-08-25

## 2015-08-24 MED ORDER — PREDNISONE 5 MG PO TABS: 30.0000 mg | ORAL_TABLET | Freq: Every day | ORAL | Status: DC

## 2015-08-24 MED ORDER — MORPHINE SULFATE (PF) 2 MG/ML IV SOLN: 1.0000 mg | INTRAVENOUS | Status: DC | PRN

## 2015-08-24 MED ORDER — CARVEDILOL 6.25 MG PO TABS
6.2500 mg | ORAL_TABLET | Freq: Two times a day (BID) | ORAL | Status: DC
  Administered 2015-08-24 – 2015-08-25 (×3): 6.25 mg via ORAL
  Filled 2015-08-24 (×3): qty 1

## 2015-08-24 MED ORDER — PREDNISONE 50 MG PO TABS
50.0000 mg | ORAL_TABLET | Freq: Every day | ORAL | Status: AC
  Administered 2015-08-24: 50 mg via ORAL
  Filled 2015-08-24: qty 1

## 2015-08-24 MED ORDER — ALBUTEROL SULFATE HFA 108 (90 BASE) MCG/ACT IN AERS: 1.0000 | INHALATION_SPRAY | Freq: Four times a day (QID) | RESPIRATORY_TRACT | Status: DC | PRN

## 2015-08-24 MED ORDER — ONDANSETRON HCL 4 MG PO TABS: 4.0000 mg | ORAL_TABLET | Freq: Four times a day (QID) | ORAL | Status: DC | PRN

## 2015-08-24 MED ORDER — RIVAROXABAN 15 MG PO TABS
15.0000 mg | ORAL_TABLET | Freq: Every day | ORAL | Status: DC
  Administered 2015-08-24: 15 mg via ORAL
  Filled 2015-08-24: qty 1

## 2015-08-24 MED ORDER — PREDNISONE 20 MG PO TABS
40.0000 mg | ORAL_TABLET | Freq: Every day | ORAL | Status: AC
  Administered 2015-08-25: 40 mg via ORAL
  Filled 2015-08-24: qty 2

## 2015-08-24 MED ORDER — MOMETASONE FURO-FORMOTEROL FUM 200-5 MCG/ACT IN AERO
2.0000 | INHALATION_SPRAY | Freq: Two times a day (BID) | RESPIRATORY_TRACT | Status: DC
  Administered 2015-08-24 – 2015-08-25 (×3): 2 via RESPIRATORY_TRACT
  Filled 2015-08-24: qty 8.8

## 2015-08-24 MED ORDER — PREDNISONE 5 MG PO TABS: 10.0000 mg | ORAL_TABLET | Freq: Every day | ORAL | Status: DC

## 2015-08-24 MED ORDER — MAGNESIUM HYDROXIDE 400 MG/5ML PO SUSP
30.0000 mL | Freq: Every day | ORAL | Status: DC
  Administered 2015-08-24 – 2015-08-25 (×2): 30 mL via ORAL
  Filled 2015-08-24 (×2): qty 30

## 2015-08-24 MED ORDER — SODIUM CHLORIDE 0.9% FLUSH
3.0000 mL | Freq: Two times a day (BID) | INTRAVENOUS | Status: DC
  Administered 2015-08-24 – 2015-08-25 (×2): 3 mL via INTRAVENOUS

## 2015-08-24 MED ORDER — DOCUSATE SODIUM 100 MG PO CAPS
100.0000 mg | ORAL_CAPSULE | Freq: Two times a day (BID) | ORAL | Status: DC
  Administered 2015-08-24 – 2015-08-25 (×2): 100 mg via ORAL
  Filled 2015-08-24 (×3): qty 1

## 2015-08-24 MED ORDER — FUROSEMIDE 40 MG PO TABS
40.0000 mg | ORAL_TABLET | Freq: Every day | ORAL | Status: DC
  Administered 2015-08-24 – 2015-08-25 (×2): 40 mg via ORAL
  Filled 2015-08-24 (×2): qty 1

## 2015-08-24 MED ORDER — ADULT MULTIVITAMIN W/MINERALS CH
1.0000 | ORAL_TABLET | Freq: Every day | ORAL | Status: DC
  Administered 2015-08-24 – 2015-08-25 (×2): 1 via ORAL
  Filled 2015-08-24 (×2): qty 1

## 2015-08-24 MED ORDER — TIOTROPIUM BROMIDE MONOHYDRATE 18 MCG IN CAPS
18.0000 ug | ORAL_CAPSULE | Freq: Every day | RESPIRATORY_TRACT | Status: DC
  Administered 2015-08-24 – 2015-08-25 (×2): 18 ug via RESPIRATORY_TRACT
  Filled 2015-08-24: qty 5

## 2015-08-24 MED ORDER — VITAMIN B-12 1000 MCG PO TABS
1000.0000 ug | ORAL_TABLET | Freq: Every day | ORAL | Status: DC
  Administered 2015-08-24 – 2015-08-25 (×2): 1000 ug via ORAL
  Filled 2015-08-24 (×2): qty 1

## 2015-08-24 MED ORDER — ONDANSETRON HCL 4 MG/2ML IJ SOLN: 4.0000 mg | Freq: Four times a day (QID) | INTRAMUSCULAR | Status: DC | PRN

## 2015-08-24 MED ORDER — PANTOPRAZOLE SODIUM 40 MG PO TBEC
40.0000 mg | DELAYED_RELEASE_TABLET | Freq: Every day | ORAL | Status: DC
  Administered 2015-08-24 – 2015-08-25 (×2): 40 mg via ORAL
  Filled 2015-08-24 (×2): qty 1

## 2015-08-24 MED ORDER — LORATADINE 10 MG PO TABS
10.0000 mg | ORAL_TABLET | Freq: Every day | ORAL | Status: DC
  Administered 2015-08-24 – 2015-08-25 (×2): 10 mg via ORAL
  Filled 2015-08-24 (×2): qty 1

## 2015-08-24 MED ORDER — ACETAMINOPHEN 650 MG RE SUPP
650.0000 mg | Freq: Four times a day (QID) | RECTAL | Status: DC | PRN
Start: 2015-08-24 — End: 2015-08-25

## 2015-08-24 MED ORDER — ALBUTEROL SULFATE (2.5 MG/3ML) 0.083% IN NEBU
2.5000 mg | INHALATION_SOLUTION | Freq: Four times a day (QID) | RESPIRATORY_TRACT | Status: DC | PRN
  Administered 2015-08-24 – 2015-08-25 (×4): 2.5 mg via RESPIRATORY_TRACT
  Filled 2015-08-24 (×4): qty 3

## 2015-08-24 MED ORDER — ACETAMINOPHEN 325 MG PO TABS: 650.0000 mg | ORAL_TABLET | Freq: Four times a day (QID) | ORAL | Status: DC | PRN

## 2015-08-24 MED ORDER — PREDNISONE 5 MG PO TABS: 5.0000 mg | ORAL_TABLET | Freq: Every day | ORAL | Status: DC

## 2015-08-24 NOTE — Telephone Encounter (Signed)
Pt was advise to go to ER as per Dr. Luan Pulling yesterday 08/23/2015.

## 2015-08-24 NOTE — Progress Notes (Signed)
[place] [facility] [place] NAME: [name] [name]    MR#:  [id number]  DATE OF BIRTH:  Nov 14, 1922  SUBJECTIVE:   Pt. Here due to worsening shortness of breath and noted to be in acute on chronic respiratory failure. Still feels somewhat short of breath but improved since yesterday.  REVIEW OF SYSTEMS:    Review of Systems  Constitutional: Negative for fever and chills.  HENT: Negative for congestion and tinnitus.   Eyes: Negative for blurred vision and double vision.  Respiratory: Positive for cough and shortness of breath. Negative for wheezing.   Cardiovascular: Negative for chest pain, orthopnea and PND.  Gastrointestinal: Negative for nausea, vomiting, abdominal pain and diarrhea.  Genitourinary: Negative for dysuria and hematuria.  Neurological: Positive for weakness (generalized). Negative for dizziness, sensory change and focal weakness.  All other systems reviewed and are negative.   Nutrition: Heart healthy Tolerating Diet: Yes Tolerating PT: Await evaluation   DRUG ALLERGIES:   Allergies  Allergen Reactions  . Penicillins Itching, Swelling, Rash and Other (See Comments)    Reaction:  Arm swelling  Has patient had a PCN reaction causing immediate rash, facial/tongue/throat swelling, SOB or lightheadedness with hypotension: No Has patient had a PCN reaction causing severe rash involving mucus membranes or skin necrosis: No Has patient had a PCN reaction that required hospitalization No Has patient had a PCN reaction occurring within the last 10 years: No If all of the above answers are "NO", then may proceed with Cephalosporin use.  . Levofloxacin Other (See Comments)    Reaction:  Unknown   . Sulfa Antibiotics Other (See Comments)    Reaction:  Unknown     VITALS:  Blood pressure 146/72, pulse 73, temperature 97.7 F (36.5 C), temperature source Oral, resp. rate 18, height 5\' 7"  (1.702 m), weight 72.576 kg (160 lb), SpO2  98 %.  PHYSICAL EXAMINATION:   Physical Exam  GENERAL:  82 y.o.-year-old patient lying in the bed with no acute distress.  EYES: Pupils equal, round, reactive to light and accommodation. No scleral icterus. Extraocular muscles intact.  HEENT: Head atraumatic, normocephalic. Oropharynx and nasopharynx clear.  NECK:  Supple, no jugular venous distention. No thyroid enlargement, no tenderness.  LUNGS: Normal breath sounds bilaterally, no wheezing, rales, rhonchi. No use of accessory muscles of respiration.  CARDIOVASCULAR: S1, S2 normal. No murmurs, rubs, or gallops.  ABDOMEN: Soft, nontender, nondistended. Bowel sounds present. No organomegaly or mass.  EXTREMITIES: No cyanosis, clubbing or edema b/l.    NEUROLOGIC: Cranial nerves II through XII are intact. No focal Motor or sensory deficits b/l.   PSYCHIATRIC: The patient is alert and oriented x 3. Good affect.    SKIN: No obvious rash, lesion, or ulcer.    LABORATORY PANEL:   CBC  Recent Labs Lab 08/23/15 1850  WBC 5.2  HGB 11.5*  HCT 34.7*  PLT 202   ------------------------------------------------------------------------------------------------------------------  Chemistries   Recent Labs Lab 08/23/15 1850  NA 138  K 3.4*  CL 95*  CO2 36*  GLUCOSE 185*  BUN 15  CREATININE 1.04*  CALCIUM 8.9  AST 23  ALT 11*  ALKPHOS 65  BILITOT 0.3   ------------------------------------------------------------------------------------------------------------------  Cardiac Enzymes  Recent Labs Lab 08/23/15 2104  TROPONINI <0.03   ------------------------------------------------------------------------------------------------------------------  RADIOLOGY:  Dg Chest Portable 1 View  08/23/2015  CLINICAL DATA:  Acute onset of shortness of breath and bilateral lower extremity swelling. Initial encounter. EXAM: PORTABLE CHEST 1 VIEW COMPARISON:  Chest  radiograph performed 06/27/2015 FINDINGS: The lungs are mildly  hyperexpanded, raising question for COPD. Mild vascular congestion is noted. Minimal bilateral atelectasis is noted. No pleural effusion or pneumothorax is seen. The cardiomediastinal silhouette is mildly enlarged. No acute osseous abnormalities are seen. Clips are noted overlying the left axilla. IMPRESSION: Findings of COPD. Mild vascular congestion and mild cardiomegaly. Minimal bilateral atelectasis noted. Electronically Signed   By: Garald Balding M.D.   On: 08/23/2015 18:49     ASSESSMENT AND PLAN:   82 year old female with past medical history of hypertension, COPD, Takasubo cardiomyopathy, history of GERD, chronic kidney disease stage III, history of breast cancer, CHF who presented to the hospital with shortness of breath and noted to be in acute on chronic respiratory failure.  #1 acute on chronic respiratory failure-this is probably secondary to underlying COPD with some mild underlying CHF. -Patient has been diuresed with some Lasix, and is also on a prednisone taper for her COPD. -Clinically improved and continue O2 supplementation.  #2 COPD Exacerbation - cont. Pred. Taper.  - albuterol nebs, Spiriva, Dulera  #3 CHF - acute on chronic diastolic CHF.  - cont. Lasix, Coreg.   #4 history of chronic afibrillation-rate controlled. -Continue Coreg, Xarelto.  #5 GERD-continue Protonix.   All the records are reviewed and case discussed with Care Management/Social Workerr. Management plans discussed with the patient, family and they are in agreement.  CODE STATUS: Full  DVT Prophylaxis: Xarelto  TOTAL TIME TAKING CARE OF THIS PATIENT: 30 minutes.   POSSIBLE D/C IN 1-2 DAYS, DEPENDING ON CLINICAL CONDITION.   Henreitta Leber M.D on 08/24/2015 at 2:16 PM  Between 7am to 6pm - Pager - [phone]  After 6pm go to [email or web address] - password [email or web address] [facility]  Office  [phone]  CC: Primary care physician; [name], MD

## 2015-08-24 NOTE — Care Management Note (Signed)
Case Management Note  Patient Details  Name: [name] [name] MRN: [id number] Date of Birth: 27-Apr-1923  Subjective/Objective:                  Met with patient to discuss discharge planning. She is from home alone. She depends on a friend and a daughter in law for transportation. She denies having a walker available at home; says she doesn't use one at home. Her PCP is Dr. Luan Pulling. She is open to [facility] for nursing and PT- resumption of care orders needed. Arville Go is aware of patient admission. She has chronic O2 at home through [place]. She also has a nebulizer at home.  Action/Plan: RNCM will continue to follow.   Expected Discharge Date:                  Expected Discharge Plan:     In-House Referral:     Discharge planning Services  CM Consult  Post Acute Care Choice:  Home Health, Durable Medical Equipment Choice offered to:  Patient  DME Arranged:    DME Agency:     HH Arranged:  PT, RN HH Agency:  [facility]  Status of Service:  In process, will continue to follow  Medicare Important Message Given:    Date Medicare IM Given:    Medicare IM give by:    Date Additional Medicare IM Given:    Additional Medicare Important Message give by:     If discussed at [place] of Stay Meetings, dates discussed:    Additional Comments:  Marshell Garfinkel, RN 08/24/2015, 9:20 AM

## 2015-08-24 NOTE — H&P (Signed)
[name] [name] is an 82 y.o. female.   Chief Complaint: Shortness of breath HPI: The patient presents emergency department complaining of shortness of breath. She states she has had progressively worsening dyspnea both at rest and with exertion for at least one week. Throughout the winter she has been using her nebulizer multiple times a day but has not been getting relief lately area she denies cough, fever, nausea, vomiting or diarrhea. She also denies orthopnea and chest pain but admits to swelling of her lower extremities which is essentially stable, however her feet do not decrease in swelling even when elevated. In the emergency department chest x-ray revealed mild vascular congestion. The patient's oxygen requirement was increased from baseline and she was quite tachycardic. She received a dose of Lasix IV in the emergency department her to the treating physician contacting the hospitalist service for further management.  Past Medical History  Diagnosis Date  . HTN (hypertension)   . COPD (chronic obstructive pulmonary disease) (HCC)     a. on home O2 nightly.  . Diverticulosis   . Takotsubo cardiomyopathy 10/2012    a. 10/2012: presented w/ cardiogenic shock/systolic CHF/VDRF - EF 41% by cath, 40% by f/u echo - cath with mild nonobstructive CAD. b. Not discharged on ACEI due to hypotension.  . Cardiogenic shock ([place])     a. 10/2012 - due to Takotsubo CM.  [name] Respiratory failure ([place])     a. On home O2 nightly. b. VDRF 10/2012 - due to CAP/pulm edema in setting of cardiogenic shock/Takotsubo CM.  [name] CAP (community acquired pneumonia)     a. 10/2012 - will need f/u CXR in June 2014 to ensure clearance.  . Esophageal stricture     a. s/p dilitation 10/2012.  [name] Pleural effusion, right     a. s/p thoracentesis 10/28/12.  . Atrial fibrillation ([place])     a. Dx 10/2012, rate controlled, started on Xarelto.  . Claudication ([place])     a. ABI 10/2012 - R 0.87, L 0.74.  [name] GERD (gastroesophageal reflux  disease)   . CKD (chronic kidney disease), stage III   . Breast CA (HCC)     hx  . Abdominal hernia     chronic  . STEMI (ST elevation myocardial infarction) ([place])   . Cellulitis   . CHF (congestive heart failure) ([place])   . CAD (coronary artery disease)     a. 10/2012 - presented w/ STEMI felt due to Takotsubo - mild nonobstructive dz by cath.    Past Surgical History  Procedure Laterality Date  . Mastectomy    . Cataract extraction    . Esophagogastroduodenoscopy N/A 11/05/2012    Procedure: ESOPHAGOGASTRODUODENOSCOPY (EGD) with esophageal savary dilatation;  Surgeon: Missy Sabins, MD;  Location: [facility];  Service: Endoscopy;  Laterality: N/A;  savary dil...no floro needed  . Bladder surgery    . Left heart catheterization with coronary angiogram N/A 10/24/2012    Procedure: LEFT HEART CATHETERIZATION WITH CORONARY ANGIOGRAM;  Surgeon: Sherren Mocha, MD;  Location: [facility] CATH LAB;  Service: Cardiovascular;  Laterality: N/A;  . Peripheral vascular catheterization N/A 02/07/2015    Procedure: Abdominal Aortogram w/Lower Extremity;  Surgeon: Katha Cabal, MD;  Location: [place] CV LAB;  Service: Cardiovascular;  Laterality: N/A;  . Peripheral vascular catheterization  02/07/2015    Procedure: Lower Extremity Intervention;  Surgeon: Katha Cabal, MD;  Location: [facility] CV LAB;  Service: Cardiovascular;;  . Coronary angioplasty    . Esophagogastroduodenoscopy (egd)  with propofol N/A 05/23/2015    Procedure: ESOPHAGOGASTRODUODENOSCOPY (EGD) WITH PROPOFOL;  Surgeon: Lucilla Lame, MD;  Location: [facility] ENDOSCOPY;  Service: Endoscopy;  Laterality: N/A;    Family History  Problem Relation Age of Onset  . Heart attack Mother   . Stroke Mother   . COPD Son   . Pneumonia Father    Social History:  reports that she has quit smoking. Her smoking use included Cigarettes. She has a 55 pack-year smoking history. She has quit using smokeless tobacco. She reports that she does not  drink alcohol or use illicit drugs.  Allergies:  Allergies  Allergen Reactions  . Penicillins Itching, Swelling, Rash and Other (See Comments)    Reaction:  Arm swelling  Has patient had a PCN reaction causing immediate rash, facial/tongue/throat swelling, SOB or lightheadedness with hypotension: No Has patient had a PCN reaction causing severe rash involving mucus membranes or skin necrosis: No Has patient had a PCN reaction that required hospitalization No Has patient had a PCN reaction occurring within the last 10 years: No If all of the above answers are "NO", then may proceed with Cephalosporin use.  . Levofloxacin Other (See Comments)    Reaction:  Unknown   . Sulfa Antibiotics Other (See Comments)    Reaction:  Unknown      (Not in a hospital admission)  Results for orders placed or performed during the hospital encounter of 08/23/15 (from the past 48 hour(s))  Comprehensive metabolic panel     Status: Abnormal   Collection Time: 08/23/15  6:50 PM  Result Value Ref Range   Sodium 138 135 - 145 mmol/L   Potassium 3.4 (L) 3.5 - 5.1 mmol/L   Chloride 95 (L) 101 - 111 mmol/L   CO2 36 (H) 22 - 32 mmol/L   Glucose, Bld 185 (H) 65 - 99 mg/dL   BUN 15 6 - 20 mg/dL   Creatinine, Ser 1.04 (H) 0.44 - 1.00 mg/dL   Calcium 8.9 8.9 - 10.3 mg/dL   Total Protein 7.1 6.5 - 8.1 g/dL   Albumin 3.9 3.5 - 5.0 g/dL   AST 23 15 - 41 U/L   ALT 11 (L) 14 - 54 U/L   Alkaline Phosphatase 65 38 - 126 U/L   Total Bilirubin 0.3 0.3 - 1.2 mg/dL   GFR calc non Af Amer 46 (L) >60 mL/min   GFR calc Af Amer 53 (L) >60 mL/min    Comment: (NOTE) The eGFR has been calculated using the CKD EPI equation. This calculation has not been validated in all clinical situations. eGFR's persistently <60 mL/min signify possible Chronic Kidney Disease.    Anion gap 7 5 - 15  Troponin I     Status: None   Collection Time: 08/23/15  6:50 PM  Result Value Ref Range   Troponin I <0.03 <0.031 ng/mL    Comment:         NO INDICATION OF MYOCARDIAL INJURY.   CBC with Differential     Status: Abnormal   Collection Time: 08/23/15  6:50 PM  Result Value Ref Range   WBC 5.2 3.6 - 11.0 K/uL   RBC 3.65 (L) 3.80 - 5.20 MIL/uL   Hemoglobin 11.5 (L) 12.0 - 16.0 g/dL   HCT 34.7 (L) 35.0 - 47.0 %   MCV 95.2 80.0 - 100.0 fL   MCH 31.5 26.0 - 34.0 pg   MCHC 33.1 32.0 - 36.0 g/dL   RDW 14.7 (H) 11.5 - 14.5 %  Platelets 202 150 - 440 K/uL   Neutrophils Relative % 56 %   Neutro Abs 2.9 1.4 - 6.5 K/uL   Lymphocytes Relative 29 %   Lymphs Abs 1.5 1.0 - 3.6 K/uL   Monocytes Relative 11 %   Monocytes Absolute 0.6 0.2 - 0.9 K/uL   Eosinophils Relative 3 %   Eosinophils Absolute 0.1 0 - 0.7 K/uL   Basophils Relative 1 %   Basophils Absolute 0.0 0 - 0.1 K/uL  Brain natriuretic peptide     Status: Abnormal   Collection Time: 08/23/15  6:50 PM  Result Value Ref Range   B Natriuretic Peptide 214.0 (H) 0.0 - 100.0 pg/mL  Lactic acid, plasma     Status: None   Collection Time: 08/23/15  6:51 PM  Result Value Ref Range   Lactic Acid, Venous 1.9 0.5 - 2.0 mmol/L  Troponin I     Status: None   Collection Time: 08/23/15  9:04 PM  Result Value Ref Range   Troponin I <0.03 <0.031 ng/mL    Comment:        NO INDICATION OF MYOCARDIAL INJURY.   Lactic acid, plasma     Status: None   Collection Time: 08/23/15  9:26 PM  Result Value Ref Range   Lactic Acid, Venous 1.4 0.5 - 2.0 mmol/L   Dg Chest Portable 1 View  08/23/2015  CLINICAL DATA:  Acute onset of shortness of breath and bilateral lower extremity swelling. Initial encounter. EXAM: PORTABLE CHEST 1 VIEW COMPARISON:  Chest radiograph performed 06/27/2015 FINDINGS: The lungs are mildly hyperexpanded, raising question for COPD. Mild vascular congestion is noted. Minimal bilateral atelectasis is noted. No pleural effusion or pneumothorax is seen. The cardiomediastinal silhouette is mildly enlarged. No acute osseous abnormalities are seen. Clips are noted overlying  the left axilla. IMPRESSION: Findings of COPD. Mild vascular congestion and mild cardiomegaly. Minimal bilateral atelectasis noted. Electronically Signed   By: Garald Balding M.D.   On: 08/23/2015 18:49    Review of Systems  Constitutional: Negative for fever and chills.  HENT: Negative for sore throat and tinnitus.   Eyes: Negative for blurred vision and redness.  Respiratory: Positive for shortness of breath. Negative for cough.   Cardiovascular: Negative for chest pain, palpitations, orthopnea and PND.  Gastrointestinal: Negative for nausea, vomiting, abdominal pain and diarrhea.  Genitourinary: Negative for dysuria, urgency and frequency.  Musculoskeletal: Negative for myalgias and joint pain.  Skin: Negative for rash.       No lesions  Neurological: Negative for speech change, focal weakness and weakness.  Endo/Heme/Allergies: Does not bruise/bleed easily.       No temperature intolerance  Psychiatric/Behavioral: Negative for depression and suicidal ideas.    Blood pressure 112/89, pulse 51, temperature 97.5 F (36.4 C), temperature source Oral, resp. rate 18, height 5' 7"  (1.702 m), weight 72.576 kg (160 lb), SpO2 97 %. Physical Exam  Vitals reviewed. Constitutional: She is oriented to person, place, and time. She appears well-developed and well-nourished. No distress.  HENT:  Head: Normocephalic and atraumatic.  Mouth/Throat: Oropharynx is clear and moist.  Eyes: Conjunctivae and EOM are normal. Pupils are equal, round, and reactive to light. No scleral icterus.  Neck: Normal range of motion. Neck supple. No JVD present. No tracheal deviation present. No thyromegaly present.  Cardiovascular: Normal rate, regular rhythm and normal heart sounds.  Exam reveals no gallop and no friction rub.   No murmur heard. Respiratory: Effort normal and breath sounds normal.  GI: Soft.  Bowel sounds are normal. She exhibits no distension. There is no tenderness.  Genitourinary:  Deferred   Musculoskeletal: Normal range of motion. She exhibits edema (non-tense, non-pitting).  Lymphadenopathy:    She has no cervical adenopathy.  Neurological: She is alert and oriented to person, place, and time. No cranial nerve deficit. She exhibits normal muscle tone.  Skin: Skin is warm and dry. No rash noted. No erythema.  Psychiatric: She has a normal mood and affect. Her behavior is normal. Judgment and thought content normal.     Assessment/Planthis is a 82 year old female with respiratory distress secondary to COPD and heart failure. 1. Respiratory distress: The patient is comfortably tachypneic. Oxygen saturations have improved with nasal cannula. Wean submental oxygen as tolerated. 2. Congestive heart failure: Likely diastolic although echocardiogram cannot definitively show diastolic function. Most recent ejection fraction is preserved 60-65%. She is received Lasix in the emergency department but I believe that her heart failure is actually preload dependent. Thus I will give her gentle IV hydration. Continue home dose of Lasix to maintain fluid balance. 3. COPD: The patient does not meet criteria for exacerbation although I have started her on a steroid taper given her reported history of poor response to DuoNeb at home. Continue Spiriva. Continue inhaled corticosteroid. 4. Atrial fibrillation: Rate controlled; continue carvedilol. Xarelto for anticoagulation 5. DVT prophylaxis: As above 6. GI prophylaxis: None The patient is a full code. Time spent on admission orders and patient care approximately 45 minutes  Harrie Foreman, MD 08/24/2015, 1:17 AM

## 2015-08-25 MED ORDER — PREDNISONE 10 MG PO TABS: ORAL_TABLET | ORAL | Status: DC

## 2015-08-25 NOTE — Care Management (Addendum)
PT pending. Discharge order to home with resumption of home health services. I have notified [facility] Go of home health need. Patient may benefit from rolling walker. I will follow PT recommendation and have one delivered prior to discharge if patient agrees. She states she has a rolling walker at home she can use. No further RNCM needs. Case closed.

## 2015-08-25 NOTE — Clinical Social Work Note (Signed)
Clinical Social Worker consulted for [employer]. PT is recommending HHPT. RNCM is following for discharge planning needs. CSW is signing off as no further needs identified.   Darden Dates, MSW, LCSW  Clinical Social Worker  [phone]

## 2015-08-25 NOTE — Evaluation (Signed)
Physical Therapy Evaluation Patient Details Name: [name] [name] MRN: [id number] DOB: 11/18/1922 Today's Date: 08/25/2015   History of Present Illness  Pt admitted for complaints of SOB at rest and with exertion x 1 week. Pt with history of COPD, HTN, and STEMI and wears chronic 2L of O2 at home.  Clinical Impression  Pt is a pleasant 82 year old female who was admitted for complaints of SOB. Pt performs bed mobility, transfers, and ambulation with cga and rw. All mobility performed on 3L of O2, increased from baseline 2L of O2 at home. Pt demonstrates deficits with strength/endurance/mobility/balance. Pt needs RW for all mobility to improve balance/endurance and decrease fall risk. O2 sats decreased quickly with limited mobility, RN/MD notified. Would benefit from skilled PT to address above deficits and promote optimal return to PLOF.      Follow Up Recommendations Home health PT    Equipment Recommendations  Rolling walker with 5" wheels    Recommendations for Other Services       Precautions / Restrictions Precautions Precautions: Fall Restrictions Weight Bearing Restrictions: No      Mobility  Bed Mobility Overal bed mobility: Needs Assistance Bed Mobility: Supine to Sit     Supine to sit: Min guard     General bed mobility comments: cues for sequencing and scooting towards EOB. Once seated at EOB, pt with slight SOB with cues for pursed lip breathing. O2 sats remain at 93% at rest on 3L of O2.  Transfers Overall transfer level: Needs assistance Equipment used: Rolling walker (2 wheeled) Transfers: Sit to/from Stand Sit to Stand: Min guard         General transfer comment: transfers performed with rw and safe technique. Once standing, pt demonstrates upright posture. O2 donned for all mobility  Ambulation/Gait Ambulation/Gait assistance: Min guard Ambulation Distance (Feet): 70 Feet Assistive device: Rolling walker (2 wheeled) Gait Pattern/deviations:  Step-through pattern     General Gait Details: slow gait speed with reciprocal gait pattern performed. Pt with complaints of dizziness upon ambulation with 1 standing rest break noted. Pt becomes fatigued and SOB with limited exertion, requesting to return back to room. Once seated O2 sats decreased to 85% with cues for pursed lip breathing. Sats quickly improved to 92% with resting.  Stairs            Wheelchair Mobility    Modified Rankin (Stroke Patients Only)       Balance Overall balance assessment: Needs assistance Sitting-balance support: Feet supported Sitting balance-Leahy Scale: Normal     Standing balance support: Bilateral upper extremity supported Standing balance-Leahy Scale: Good                               Pertinent Vitals/Pain Pain Assessment: No/denies pain    Home Living Family/patient expects to be discharged to:: Private residence Living Arrangements: Alone Available Help at Discharge: Friend(s);Available PRN/intermittently Type of Home: House Home Access: Level entry     Home Layout: One level Home Equipment: Cane - single point Additional Comments: only recently started using cane.    Prior Function Level of Independence: Independent with assistive device(s)         Comments: Pt was indep with household ambulation and ADLs.      Hand Dominance   Dominant Hand: Right    Extremity/Trunk Assessment   Upper Extremity Assessment: Overall WFL for tasks assessed  Lower Extremity Assessment: Generalized weakness (B LE grossly 4/5)         Communication   Communication: No difficulties  Cognition Arousal/Alertness: Awake/alert Behavior During Therapy: WFL for tasks assessed/performed Overall Cognitive Status: Within Functional Limits for tasks assessed                      General Comments      Exercises Other Exercises Other Exercises: supine ther-ex performed including B LE ankle pumps,  quad sets, SLR, heel slides, and hip abd/add. All ther-ex performed x 10 reps with cga. 2 rest breaks required as pt becomes SOB with exertion. O2 sats WNL.      Assessment/Plan    PT Assessment Patient needs continued PT services  PT Diagnosis Difficulty walking;Generalized weakness   PT Problem List Decreased strength;Decreased balance;Decreased mobility;Decreased knowledge of use of DME;Cardiopulmonary status limiting activity  PT Treatment Interventions Gait training;Therapeutic exercise;DME instruction   PT Goals (Current goals can be found in the Care Plan section) Acute Rehab PT Goals Patient Stated Goal: to get stronger PT Goal Formulation: With patient Time For Goal Achievement: 09/08/15 Potential to Achieve Goals: Good    Frequency Min 2X/week   Barriers to discharge Decreased caregiver support      Co-evaluation               End of Session Equipment Utilized During Treatment: Gait belt;Oxygen Activity Tolerance: Patient limited by fatigue Patient left: in chair;with chair alarm set Nurse Communication: Mobility status         Time: [id number] PT Time Calculation (min) (ACUTE ONLY): 27 min   Charges:   PT Evaluation $PT Eval Moderate Complexity: 1 Procedure PT Treatments $Therapeutic Exercise: 8-22 mins   PT G Codes:        [name] 17-Sep-2015, 11:48 AM [name], PT, DPT [phone]

## 2015-08-25 NOTE — Discharge Summary (Signed)
[facility] NAME: [name] [name]    MR#:  [id number]  DATE OF BIRTH:  1922/09/30  DATE OF ADMISSION:  08/23/2015 ADMITTING PHYSICIAN: Harrie Foreman, MD  DATE OF DISCHARGE: 08/25/2015  3:19 PM  PRIMARY CARE PHYSICIAN: [name], MD    ADMISSION DIAGNOSIS:  Dyspnea [R06.00]  DISCHARGE DIAGNOSIS:  Active Problems:   Respiratory failure with hypoxia ([place])   SECONDARY DIAGNOSIS:   Past Medical History  Diagnosis Date  . HTN (hypertension)   . COPD (chronic obstructive pulmonary disease) (HCC)     a. on home O2 nightly.  . Diverticulosis   . Takotsubo cardiomyopathy 10/2012    a. 10/2012: presented w/ cardiogenic shock/systolic CHF/VDRF - EF [id number] by cath, 40% by f/u echo - cath with mild nonobstructive CAD. b. Not discharged on ACEI due to hypotension.  . Cardiogenic shock ([place])     a. 10/2012 - due to Takotsubo CM.  [name] Respiratory failure ([place])     a. On home O2 nightly. b. VDRF 10/2012 - due to CAP/pulm edema in setting of cardiogenic shock/Takotsubo CM.  [name] CAP (community acquired pneumonia)     a. 10/2012 - will need f/u CXR in June 2014 to ensure clearance.  . Esophageal stricture     a. s/p dilitation 10/2012.  [name] Pleural effusion, right     a. s/p thoracentesis 10/28/12.  . Atrial fibrillation ([place])     a. Dx 10/2012, rate controlled, started on Xarelto.  . Claudication ([place])     a. ABI 10/2012 - R 0.87, L 0.74.  [name] GERD (gastroesophageal reflux disease)   . CKD (chronic kidney disease), stage III   . Breast CA (HCC)     hx  . Abdominal hernia     chronic  . STEMI (ST elevation myocardial infarction) ([place])   . Cellulitis   . CHF (congestive heart failure) ([place])   . CAD (coronary artery disease)     a. 10/2012 - presented w/ STEMI felt due to Takotsubo - mild nonobstructive dz by cath.    HOSPITAL COURSE:   82 year old female with past medical history of hypertension, COPD, Takasubo cardiomyopathy, history of  GERD, chronic kidney disease stage III, history of breast cancer, CHF who presented to the hospital with shortness of breath and noted to be in acute on chronic respiratory failure.  #1 acute on chronic respiratory failure-this was probably secondary to underlying COPD with some mild underlying CHF. -Patient was diuresed with some Lasix and also given some IV steroids and has improved now. She is already on chronic oxygen at home. -She is not being discharged on oral prednisone taper and maintenance inhalers and will follow up with her primary care physician as an outpatient. Patient had no evidence of acute infectious process while in the hospital.  #2 COPD Exacerbation - patient was given some IV steroids, scheduled DuoNeb's, Pulmicort nebs on the hospital and has improved. -She is being discharged on oral prednisone taper, and will resume her Spiriva and Dulera and nebulizers as needed.   #3 CHF - acute on chronic diastolic CHF. Patient was given some IV Lasix in the hospital and improved with diuresis. - She will resume her home dose Lasix, Coreg upon discharge.  #4 history of chronic afibrillation-patient remained rate controlled while in the hospital and she will continue her Coreg. -She will continue her Xarelto.  #5 GERD-patient will resume her omeprazole, ranitidine.  DISCHARGE CONDITIONS:   Stable  CONSULTS OBTAINED:     DRUG ALLERGIES:   Allergies  Allergen Reactions  . Penicillins Itching, Swelling, Rash and Other (See Comments)    Reaction:  Arm swelling  Has patient had a PCN reaction causing immediate rash, facial/tongue/throat swelling, SOB or lightheadedness with hypotension: No Has patient had a PCN reaction causing severe rash involving mucus membranes or skin necrosis: No Has patient had a PCN reaction that required hospitalization No Has patient had a PCN reaction occurring within the last 10 years: No If all of the above answers are "NO", then may proceed  with Cephalosporin use.  . Levofloxacin Other (See Comments)    Reaction:  Unknown   . Sulfa Antibiotics Other (See Comments)    Reaction:  Unknown     DISCHARGE MEDICATIONS:   Discharge Medication List as of 08/25/2015 12:54 PM    START taking these medications   Details  predniSONE (DELTASONE) 10 MG tablet Label  & dispense according to the schedule below. 4 Pills PO for 1 day then, 3 Pills PO for 1 day, 2 Pills PO for 1 day, 1 Pill PO for 1 day then STOP., Print      CONTINUE these medications which have NOT CHANGED   Details  albuterol (PROVENTIL HFA;VENTOLIN HFA) 108 (90 Base) MCG/ACT inhaler Inhale 1-2 puffs into the lungs every 6 (six) hours as needed for wheezing or shortness of breath., Starting 08/17/2015, Until Discontinued, Normal    albuterol (PROVENTIL) (2.5 MG/3ML) 0.083% nebulizer solution Take 2.5 mg by nebulization every 6 (six) hours as needed for wheezing or shortness of breath., Until Discontinued, Historical Med    carvedilol (COREG) 6.25 MG tablet Take 6.25 mg by mouth 2 (two) times daily with a meal., Until Discontinued, Historical Med    Fluticasone-Salmeterol (ADVAIR) 250-50 MCG/DOSE AEPB Inhale 1 puff into the lungs 2 (two) times daily., Until Discontinued, Historical Med    furosemide (LASIX) 40 MG tablet Take 40-80 mg by mouth 2 (two) times daily. Pt takes two tablets in the morning and one tablet in the evening., Until Discontinued, Historical Med    loratadine (CLARITIN) 10 MG tablet Take 1 tablet (10 mg total) by mouth daily., Starting 05/29/2015, Until Discontinued, Normal    Multiple Vitamin (MULTIVITAMIN WITH MINERALS) TABS tablet Take 1 tablet by mouth daily. , Until Discontinued, Historical Med    omeprazole (PRILOSEC) 20 MG capsule Take 1 capsule (20 mg total) by mouth daily., Starting 12/21/2014, Until Discontinued, Normal    ranitidine (ZANTAC) 150 MG tablet Take 150 mg by mouth at bedtime. , Until Discontinued, Historical Med    Rivaroxaban  (XARELTO) 15 MG TABS tablet Take 15 mg by mouth every evening. , Until Discontinued, Historical Med    tiotropium (SPIRIVA) 18 MCG inhalation capsule Place 18 mcg into inhaler and inhale daily., Until Discontinued, Historical Med    vitamin B-12 (CYANOCOBALAMIN) 1000 MCG tablet Take 1,000 mcg by mouth daily., Until Discontinued, Historical Med         DISCHARGE INSTRUCTIONS:   DIET:  Cardiac diet  DISCHARGE CONDITION:  Stable  ACTIVITY:  Activity as tolerated  OXYGEN:  Home Oxygen: Yes.     Oxygen Delivery: 2 liters/min via Patient connected to nasal cannula oxygen  DISCHARGE LOCATION:  Home with home health nursing, physical therapy, home health aide   If you experience worsening of your admission symptoms, develop shortness of breath, life threatening emergency, suicidal or homicidal thoughts you must seek medical attention immediately by calling 911 or calling your MD  immediately  if symptoms less severe.  You Must read complete instructions/literature along with all the possible adverse reactions/side effects for all the Medicines you take and that have been prescribed to you. Take any new Medicines after you have completely understood and accpet all the possible adverse reactions/side effects.   Please note  You were cared for by a hospitalist during your hospital stay. If you have any questions about your discharge medications or the care you received while you were in the hospital after you are discharged, you can call the unit and asked to speak with the hospitalist on call if the hospitalist that took care of you is not available. Once you are discharged, your primary care physician will handle any further medical issues. Please note that NO REFILLS for any discharge medications will be authorized once you are discharged, as it is imperative that you return to your primary care physician (or establish a relationship with a primary care physician if you do not have one) for  your aftercare needs so that they can reassess your need for medications and monitor your lab values.     Today   Shortness of breath improved. Still feels a bit weak.  VITAL SIGNS:  Blood pressure 102/47, pulse 60, temperature 97.8 F (36.6 C), temperature source Oral, resp. rate 18, height 5\' 7"  (1.702 m), weight 77.474 kg (170 lb 12.8 oz), SpO2 96 %.  I/O:   Intake/Output Summary (Last 24 hours) at 08/25/15 1623 Last data filed at 08/25/15 1132  Gross per 24 hour  Intake    480 ml  Output      0 ml  Net    480 ml    PHYSICAL EXAMINATION:  GENERAL:  82 y.o.-year-old patient lying in the bed in no acute distress.  EYES: Pupils equal, round, reactive to light and accommodation. No scleral icterus. Extraocular muscles intact.  HEENT: Head atraumatic, normocephalic. Oropharynx and nasopharynx clear.  NECK:  Supple, no jugular venous distention. No thyroid enlargement, no tenderness.  LUNGS: Normal breath sounds bilaterally, no wheezing, rales,rhonchi. No use of accessory muscles of respiration.  CARDIOVASCULAR: S1, S2 normal. No murmurs, rubs, or gallops.  ABDOMEN: Soft, non-tender, non-distended. Bowel sounds present. No organomegaly or mass.  EXTREMITIES: No pedal edema, cyanosis, or clubbing.  NEUROLOGIC: Cranial nerves II through XII are intact. No focal motor or sensory defecits b/l.  PSYCHIATRIC: The patient is alert and oriented x 3. Good affect.  SKIN: No obvious rash, lesion, or ulcer.   DATA REVIEW:   CBC  Recent Labs Lab 08/23/15 1850  WBC 5.2  HGB 11.5*  HCT 34.7*  PLT 202    Chemistries   Recent Labs Lab 08/23/15 1850  NA 138  K 3.4*  CL 95*  CO2 36*  GLUCOSE 185*  BUN 15  CREATININE 1.04*  CALCIUM 8.9  AST 23  ALT 11*  ALKPHOS 65  BILITOT 0.3    Cardiac Enzymes  Recent Labs Lab 08/23/15 2104  TROPONINI <0.03    Microbiology Results  Results for orders placed or performed in visit on 03/28/15  Urine culture     Status: None    Collection Time: 03/28/15 12:00 AM  Result Value Ref Range Status   Urine Culture, Routine Final report  Final   Urine Culture result 1 Comment  Final    Comment: Mixed urogenital flora Greater than 100,000 colony forming units per mL     RADIOLOGY:  Dg Chest Portable 1 View  08/23/2015  CLINICAL  DATA:  Acute onset of shortness of breath and bilateral lower extremity swelling. Initial encounter. EXAM: PORTABLE CHEST 1 VIEW COMPARISON:  Chest radiograph performed 06/27/2015 FINDINGS: The lungs are mildly hyperexpanded, raising question for COPD. Mild vascular congestion is noted. Minimal bilateral atelectasis is noted. No pleural effusion or pneumothorax is seen. The cardiomediastinal silhouette is mildly enlarged. No acute osseous abnormalities are seen. Clips are noted overlying the left axilla. IMPRESSION: Findings of COPD. Mild vascular congestion and mild cardiomegaly. Minimal bilateral atelectasis noted. Electronically Signed   By: Garald Balding M.D.   On: 08/23/2015 18:49      Management plans discussed with the patient, family and they are in agreement.  CODE STATUS:     Code Status Orders        Start     Ordered   08/24/15 0236  Full code   Continuous     08/24/15 0235    Code Status History    Date Active Date Inactive Code Status Order ID Comments User Context   06/27/2015 10:43 AM 06/30/2015  7:41 PM Full Code [id number]  Loletha Grayer, MD ED   04/10/2015  6:10 PM 04/14/2015  5:52 PM Full Code [id number]  Max Sane, MD Inpatient   03/03/2015  8:05 PM 03/08/2015  6:00 PM Full Code [id number]  Fritzi Mandes, MD ED   02/07/2015 11:08 AM 02/07/2015  7:00 PM Full Code [id number]  Katha Cabal, MD Inpatient   10/25/2012 10:04 PM 11/06/2012  7:12 PM Full Code [id number]  Elsie Stain, MD Inpatient      TOTAL TIME TAKING CARE OF THIS PATIENT: 40 minutes.    Henreitta Leber M.D on 08/25/2015 at 4:23 PM  Between 7am to 6pm - Pager - [phone]  After 6pm go to  [email or web address] - password [email or web address] [facility]  Office  [phone]  CC: Primary care physician; [name], MD

## 2015-08-25 NOTE — Progress Notes (Signed)
Patient discharging home, instructions and prescriptions given to patient, verbalized understanding. Waiting on transportation from family.

## 2015-08-25 NOTE — Discharge Instructions (Signed)
Heart Failure Clinic appointment on September 15, 2015 at 11:30am with Darylene Price, [place]. Please call [phone] to reschedule.

## 2015-08-25 NOTE — Progress Notes (Signed)
Pt discharged home with cger

## 2015-08-25 NOTE — Progress Notes (Signed)
Initial appointment scheduled at the [facility] Failure Clinic on September 15, 2015 at 11:30am. Thank you.

## 2015-08-28 ENCOUNTER — Other Ambulatory Visit: Payer: Self-pay | Admitting: *Deleted

## 2015-08-28 NOTE — Patient Outreach (Signed)
[place] [facility]) Care Management  08/28/2015  [name] [name] [birth year] [id number]   Transition of care call  RNCM placed call to patient as part of the transition of care program, Mrs.[name] [name] was admitted to [facility] on 3/15 and discharged on 3/17 with Acute on chronic respiratory failure, copd, mild chf. Mrs.[name] [name] reports that she is having some shortness of breath with activity in home, she states she was able to get her prescription for prednisone filled and is taking as ordered and using nebulizer treatments today with relief. Reviewed with patient symptoms in yellow zone to notify MD of , patient verbalized understanding.  Mrs.[name] [name] states she forgot to weight on today, but states he will get back to it on tomorrow. Reviewed importance of tracking weights daily. Patient denies increase in swelling on today.  Patient will resume with [place] home health physical therapy, spoke [place] home health agency and they will contact patient by 3/21 to arrange first visit.  Mrs.[name] [name] has post hospital office visit with Dr.Hawkins on 3/23, and her daughter in law or friend will provide  transportation.  Plan Patient will remain active with transition of care program, next telephone visit will be 3/27. Patient will notify PCP for worsening symptoms and 911 for emergency, red zones symptoms.  [facility] CM Care Plan Problem One        Most Recent Value   Care Plan Problem One  Patient    Role Documenting the Problem One  Care Management Coordinator   Care Plan for Problem One  Active   [facility] Long Term Goal (31-90 days)  Patient will not readmit to hospital in the next 31 days   [facility] Long Term Goal Start Date  08/28/15   Interventions for Problem One Long Term Goal  Reviewed importance of calling MD for worsening symptoms sooner to arrange office visit., reintroduced to transition of care care program   [facility] CM Short Term Goal #1 (0-30 days)  Patient will attend PCP appointment in the next 7  days   [facility] CM Short Term Goal #1 Start Date  08/28/15   Interventions for Short Term Goal #1  Verified patient has transportation for her office visit on 3/23,   [facility] CM Short Term Goal #2 (0-30 days)  Patient will weight daily and record in the next 30 days   [facility] CM Short Term Goal #2 Start Date  08/01/15   Interventions for Short Term Goal #2  Re-educated patient on the importance of monitoring and keeping a record of daily weights, to help track and small weight increases sooner     Joylene Draft, RN, [place] Management [phone]- Mobile [phone]- [place]

## 2015-08-28 NOTE — Patient Outreach (Signed)
[place] [facility]) Care Management  08/28/2015  [name] [name] [birth year] [id number]  Transition of care  RNCM placed call to patient as part of the transition of care program, phone line with a busy signal unable to leave a message .  RNCM placed call to Luanna Cole her daughter in law listed on consent, HIPAA verified, Diane states she has spoken to patient on this am and she was doing all right.  Plan RNCM will attempt to contact patient later today and if unable to contact will attempt again on 3/21.  Joylene Draft, RN, [place] [facility] [phone]- Mobile [phone]- Toll Free Main Office

## 2015-08-29 DIAGNOSIS — I739 Peripheral vascular disease, unspecified: Secondary | ICD-10-CM | POA: Diagnosis not present

## 2015-08-29 DIAGNOSIS — Z853 Personal history of malignant neoplasm of breast: Secondary | ICD-10-CM | POA: Diagnosis not present

## 2015-08-29 DIAGNOSIS — I4891 Unspecified atrial fibrillation: Secondary | ICD-10-CM | POA: Diagnosis not present

## 2015-08-29 DIAGNOSIS — I252 Old myocardial infarction: Secondary | ICD-10-CM | POA: Diagnosis not present

## 2015-08-29 DIAGNOSIS — I5021 Acute systolic (congestive) heart failure: Secondary | ICD-10-CM | POA: Diagnosis not present

## 2015-08-29 DIAGNOSIS — Z79899 Other long term (current) drug therapy: Secondary | ICD-10-CM | POA: Diagnosis not present

## 2015-08-29 DIAGNOSIS — I130 Hypertensive heart and chronic kidney disease with heart failure and stage 1 through stage 4 chronic kidney disease, or unspecified chronic kidney disease: Secondary | ICD-10-CM | POA: Diagnosis not present

## 2015-08-29 DIAGNOSIS — N189 Chronic kidney disease, unspecified: Secondary | ICD-10-CM | POA: Diagnosis not present

## 2015-08-29 DIAGNOSIS — K219 Gastro-esophageal reflux disease without esophagitis: Secondary | ICD-10-CM | POA: Diagnosis not present

## 2015-08-29 DIAGNOSIS — J441 Chronic obstructive pulmonary disease with (acute) exacerbation: Secondary | ICD-10-CM | POA: Diagnosis not present

## 2015-08-30 DIAGNOSIS — J441 Chronic obstructive pulmonary disease with (acute) exacerbation: Secondary | ICD-10-CM | POA: Diagnosis not present

## 2015-08-30 DIAGNOSIS — Z79899 Other long term (current) drug therapy: Secondary | ICD-10-CM | POA: Diagnosis not present

## 2015-08-30 DIAGNOSIS — Z853 Personal history of malignant neoplasm of breast: Secondary | ICD-10-CM | POA: Diagnosis not present

## 2015-08-30 DIAGNOSIS — I4891 Unspecified atrial fibrillation: Secondary | ICD-10-CM | POA: Diagnosis not present

## 2015-08-30 DIAGNOSIS — K219 Gastro-esophageal reflux disease without esophagitis: Secondary | ICD-10-CM | POA: Diagnosis not present

## 2015-08-30 DIAGNOSIS — I252 Old myocardial infarction: Secondary | ICD-10-CM | POA: Diagnosis not present

## 2015-08-30 DIAGNOSIS — I130 Hypertensive heart and chronic kidney disease with heart failure and stage 1 through stage 4 chronic kidney disease, or unspecified chronic kidney disease: Secondary | ICD-10-CM | POA: Diagnosis not present

## 2015-08-30 DIAGNOSIS — N189 Chronic kidney disease, unspecified: Secondary | ICD-10-CM | POA: Diagnosis not present

## 2015-08-30 DIAGNOSIS — I739 Peripheral vascular disease, unspecified: Secondary | ICD-10-CM | POA: Diagnosis not present

## 2015-08-30 DIAGNOSIS — I5021 Acute systolic (congestive) heart failure: Secondary | ICD-10-CM | POA: Diagnosis not present

## 2015-08-31 ENCOUNTER — Telehealth: Payer: Self-pay | Admitting: *Deleted

## 2015-08-31 ENCOUNTER — Other Ambulatory Visit: Payer: Self-pay | Admitting: Family Medicine

## 2015-08-31 DIAGNOSIS — I4891 Unspecified atrial fibrillation: Secondary | ICD-10-CM | POA: Diagnosis not present

## 2015-08-31 DIAGNOSIS — I5021 Acute systolic (congestive) heart failure: Secondary | ICD-10-CM | POA: Diagnosis not present

## 2015-08-31 DIAGNOSIS — J441 Chronic obstructive pulmonary disease with (acute) exacerbation: Secondary | ICD-10-CM | POA: Diagnosis not present

## 2015-08-31 DIAGNOSIS — K219 Gastro-esophageal reflux disease without esophagitis: Secondary | ICD-10-CM | POA: Diagnosis not present

## 2015-08-31 DIAGNOSIS — I130 Hypertensive heart and chronic kidney disease with heart failure and stage 1 through stage 4 chronic kidney disease, or unspecified chronic kidney disease: Secondary | ICD-10-CM | POA: Diagnosis not present

## 2015-08-31 DIAGNOSIS — I739 Peripheral vascular disease, unspecified: Secondary | ICD-10-CM | POA: Diagnosis not present

## 2015-08-31 DIAGNOSIS — I252 Old myocardial infarction: Secondary | ICD-10-CM | POA: Diagnosis not present

## 2015-08-31 DIAGNOSIS — N189 Chronic kidney disease, unspecified: Secondary | ICD-10-CM | POA: Diagnosis not present

## 2015-08-31 DIAGNOSIS — Z79899 Other long term (current) drug therapy: Secondary | ICD-10-CM | POA: Diagnosis not present

## 2015-08-31 DIAGNOSIS — Z853 Personal history of malignant neoplasm of breast: Secondary | ICD-10-CM | POA: Diagnosis not present

## 2015-08-31 MED ORDER — ALBUTEROL SULFATE (2.5 MG/3ML) 0.083% IN NEBU: 2.5000 mg | INHALATION_SOLUTION | Freq: Four times a day (QID) | RESPIRATORY_TRACT | Status: AC | PRN

## 2015-08-31 NOTE — Telephone Encounter (Signed)
I have sent order to pharmacy.-jh

## 2015-08-31 NOTE — Telephone Encounter (Signed)
      Pt states before the end of the month, she is running out of her albuterol and has to pay out of pocket. She's taking 4 a day so she needs 120 tubes for one month. She says she needs a new prescription for this. She uses [facility] in [place]. [place]y

## 2015-09-04 ENCOUNTER — Encounter: Payer: Self-pay | Admitting: *Deleted

## 2015-09-04 ENCOUNTER — Other Ambulatory Visit: Payer: Self-pay | Admitting: *Deleted

## 2015-09-04 DIAGNOSIS — I739 Peripheral vascular disease, unspecified: Secondary | ICD-10-CM | POA: Diagnosis not present

## 2015-09-04 DIAGNOSIS — Z853 Personal history of malignant neoplasm of breast: Secondary | ICD-10-CM | POA: Diagnosis not present

## 2015-09-04 DIAGNOSIS — I130 Hypertensive heart and chronic kidney disease with heart failure and stage 1 through stage 4 chronic kidney disease, or unspecified chronic kidney disease: Secondary | ICD-10-CM | POA: Diagnosis not present

## 2015-09-04 DIAGNOSIS — I252 Old myocardial infarction: Secondary | ICD-10-CM | POA: Diagnosis not present

## 2015-09-04 DIAGNOSIS — I4891 Unspecified atrial fibrillation: Secondary | ICD-10-CM | POA: Diagnosis not present

## 2015-09-04 DIAGNOSIS — Z79899 Other long term (current) drug therapy: Secondary | ICD-10-CM | POA: Diagnosis not present

## 2015-09-04 DIAGNOSIS — K219 Gastro-esophageal reflux disease without esophagitis: Secondary | ICD-10-CM | POA: Diagnosis not present

## 2015-09-04 DIAGNOSIS — J441 Chronic obstructive pulmonary disease with (acute) exacerbation: Secondary | ICD-10-CM | POA: Diagnosis not present

## 2015-09-04 DIAGNOSIS — N189 Chronic kidney disease, unspecified: Secondary | ICD-10-CM | POA: Diagnosis not present

## 2015-09-04 DIAGNOSIS — I5021 Acute systolic (congestive) heart failure: Secondary | ICD-10-CM | POA: Diagnosis not present

## 2015-09-04 NOTE — Patient Outreach (Addendum)
[place] [facility]) Care Management  09/04/2015  [name] [name] 10/22/1922 [id number]  Transition of care week #2  RNCM placed call to patient as part of the transition of care program, [name] [name] reports that she is having her usual breathing pattern, taking her nebulizer treatments with relief. and all medications . Patient is able verbalize symptoms of worsening COPD to notify MD of . Patient reports she was able to go to church of yesterday, but feeling tired on today.  [name] [name] reports her weight today is 159, and states swelling in her legs has gone down and no increase in weight gain. [name] [name]'s friend will provided transportation to her PCP appointment on 3/30.   [name] [name] denies any new concerns, Home health physical therapy visited her on today, and the nurse will come one day this week, not sure which day.  Joylene Draft, RN, [facility] Care Management [phone]- Mobile [phone]- [place] will visit patient in home on 3/4 at 1000.

## 2015-09-05 ENCOUNTER — Inpatient Hospital Stay: Payer: Medicare Other | Admitting: Family Medicine

## 2015-09-05 DIAGNOSIS — I5021 Acute systolic (congestive) heart failure: Secondary | ICD-10-CM | POA: Diagnosis not present

## 2015-09-05 DIAGNOSIS — Z79899 Other long term (current) drug therapy: Secondary | ICD-10-CM | POA: Diagnosis not present

## 2015-09-05 DIAGNOSIS — I252 Old myocardial infarction: Secondary | ICD-10-CM | POA: Diagnosis not present

## 2015-09-05 DIAGNOSIS — I739 Peripheral vascular disease, unspecified: Secondary | ICD-10-CM | POA: Diagnosis not present

## 2015-09-05 DIAGNOSIS — Z853 Personal history of malignant neoplasm of breast: Secondary | ICD-10-CM | POA: Diagnosis not present

## 2015-09-05 DIAGNOSIS — K219 Gastro-esophageal reflux disease without esophagitis: Secondary | ICD-10-CM | POA: Diagnosis not present

## 2015-09-05 DIAGNOSIS — J441 Chronic obstructive pulmonary disease with (acute) exacerbation: Secondary | ICD-10-CM | POA: Diagnosis not present

## 2015-09-05 DIAGNOSIS — I4891 Unspecified atrial fibrillation: Secondary | ICD-10-CM | POA: Diagnosis not present

## 2015-09-05 DIAGNOSIS — N189 Chronic kidney disease, unspecified: Secondary | ICD-10-CM | POA: Diagnosis not present

## 2015-09-05 DIAGNOSIS — I130 Hypertensive heart and chronic kidney disease with heart failure and stage 1 through stage 4 chronic kidney disease, or unspecified chronic kidney disease: Secondary | ICD-10-CM | POA: Diagnosis not present

## 2015-09-06 ENCOUNTER — Telehealth: Payer: Self-pay | Admitting: Family Medicine

## 2015-09-06 NOTE — Telephone Encounter (Signed)
Called jeff and gave verbal for OT.

## 2015-09-06 NOTE — Telephone Encounter (Signed)
[name] [name] from [facility] needs a verbal for OT to evaluate and treat pt.  His call back number is [phone]

## 2015-09-07 ENCOUNTER — Ambulatory Visit (INDEPENDENT_AMBULATORY_CARE_PROVIDER_SITE_OTHER): Payer: Medicare Other | Admitting: Family Medicine

## 2015-09-07 ENCOUNTER — Encounter: Payer: Self-pay | Admitting: Family Medicine

## 2015-09-07 VITALS — BP 126/63 | HR 80 | Temp 97.8°F | Resp 16 | Ht 67.0 in | Wt 161.0 lb

## 2015-09-07 DIAGNOSIS — J441 Chronic obstructive pulmonary disease with (acute) exacerbation: Secondary | ICD-10-CM

## 2015-09-07 DIAGNOSIS — I5021 Acute systolic (congestive) heart failure: Secondary | ICD-10-CM | POA: Diagnosis not present

## 2015-09-07 DIAGNOSIS — I10 Essential (primary) hypertension: Secondary | ICD-10-CM | POA: Diagnosis not present

## 2015-09-07 DIAGNOSIS — I481 Persistent atrial fibrillation: Secondary | ICD-10-CM

## 2015-09-07 DIAGNOSIS — I4819 Other persistent atrial fibrillation: Secondary | ICD-10-CM

## 2015-09-07 MED ORDER — POTASSIUM CHLORIDE ER 10 MEQ PO TBCR: 10.0000 meq | EXTENDED_RELEASE_TABLET | Freq: Every day | ORAL | Status: AC

## 2015-09-07 NOTE — Patient Instructions (Signed)
Keep Pulmonary appt. next week as scheduled.

## 2015-09-07 NOTE — Progress Notes (Signed)
Name: [name] [name]   MRN: [id number]    DOB: [birth year]   Date:09/07/2015       Progress Note  Subjective  Chief Complaint  Chief Complaint  Patient presents with  . COPD    HPI Here for f/u of hospitalization for COPD and CHF.  Still c/o cramps on and off during the day and at night.  Breaths better when on prednisone.  She is to see Pulmonary MD next week.   Still SOB with almost any activity.    No problem-specific assessment & plan notes found for this encounter.   Past Medical History  Diagnosis Date  . HTN (hypertension)   . COPD (chronic obstructive pulmonary disease) (HCC)     a. on home O2 nightly.  . Diverticulosis   . Takotsubo cardiomyopathy 10/2012    a. 10/2012: presented w/ cardiogenic shock/systolic CHF/VDRF - EF 24% by cath, 40% by f/u echo - cath with mild nonobstructive CAD. b. Not discharged on ACEI due to hypotension.  . Cardiogenic shock ([place])     a. 10/2012 - due to Takotsubo CM.  [name] Respiratory failure ([place])     a. On home O2 nightly. b. VDRF 10/2012 - due to CAP/pulm edema in setting of cardiogenic shock/Takotsubo CM.  [name] CAP (community acquired pneumonia)     a. 10/2012 - will need f/u CXR in June 2014 to ensure clearance.  . Esophageal stricture     a. s/p dilitation 10/2012.  [name] Pleural effusion, right     a. s/p thoracentesis 10/28/12.  . Atrial fibrillation ([place])     a. Dx 10/2012, rate controlled, started on Xarelto.  . Claudication ([place])     a. ABI 10/2012 - R 0.87, L 0.74.  [name] GERD (gastroesophageal reflux disease)   . CKD (chronic kidney disease), stage III   . Breast CA (HCC)     hx  . Abdominal hernia     chronic  . STEMI (ST elevation myocardial infarction) ([place])   . Cellulitis   . CHF (congestive heart failure) ([place])   . CAD (coronary artery disease)     a. 10/2012 - presented w/ STEMI felt due to Takotsubo - mild nonobstructive dz by cath.    Past Surgical History  Procedure Laterality Date  . Mastectomy    . Cataract extraction    .  Esophagogastroduodenoscopy N/A 11/05/2012    Procedure: ESOPHAGOGASTRODUODENOSCOPY (EGD) with esophageal savary dilatation;  Surgeon: Missy Sabins, MD;  Location: [facility];  Service: Endoscopy;  Laterality: N/A;  savary dil...no floro needed  . Bladder surgery    . Left heart catheterization with coronary angiogram N/A 10/24/2012    Procedure: LEFT HEART CATHETERIZATION WITH CORONARY ANGIOGRAM;  Surgeon: Sherren Mocha, MD;  Location: [facility] CATH LAB;  Service: Cardiovascular;  Laterality: N/A;  . Peripheral vascular catheterization N/A 02/07/2015    Procedure: Abdominal Aortogram w/Lower Extremity;  Surgeon: Katha Cabal, MD;  Location: [place] CV LAB;  Service: Cardiovascular;  Laterality: N/A;  . Peripheral vascular catheterization  02/07/2015    Procedure: Lower Extremity Intervention;  Surgeon: Katha Cabal, MD;  Location: [facility] CV LAB;  Service: Cardiovascular;;  . Coronary angioplasty    . Esophagogastroduodenoscopy (egd) with propofol N/A 05/23/2015    Procedure: ESOPHAGOGASTRODUODENOSCOPY (EGD) WITH PROPOFOL;  Surgeon: Lucilla Lame, MD;  Location: [facility] ENDOSCOPY;  Service: Endoscopy;  Laterality: N/A;    Family History  Problem Relation Age of Onset  . Heart attack Mother   . Stroke Mother   .  COPD Son   . Pneumonia Father     Social History   Social History  . Marital Status: Widowed    Spouse Name: N/A  . Number of Children: 1  . Years of Education: N/A   Occupational History  . Not on file.   Social History Main Topics  . Smoking status: Former Smoker -- 1.00 packs/day for 55 years    Types: Cigarettes  . Smokeless tobacco: Former Systems developer  . Alcohol Use: No  . Drug Use: No  . Sexual Activity: No   Other Topics Concern  . Not on file   Social History Narrative     Current outpatient prescriptions:  .  albuterol (PROVENTIL HFA;VENTOLIN HFA) 108 (90 Base) MCG/ACT inhaler, Inhale 1-2 puffs into the lungs every 6 (six) hours as needed for  wheezing or shortness of breath., Disp: 18 g, Rfl: 3 .  albuterol (PROVENTIL) (2.5 MG/3ML) 0.083% nebulizer solution, Take 3 mLs (2.5 mg total) by nebulization every 6 (six) hours as needed for wheezing or shortness of breath., Disp: 120 mL, Rfl: 12 .  carvedilol (COREG) 6.25 MG tablet, Take 6.25 mg by mouth 2 (two) times daily with a meal., Disp: , Rfl:  .  Fluticasone-Salmeterol (ADVAIR) 250-50 MCG/DOSE AEPB, Inhale 1 puff into the lungs 2 (two) times daily., Disp: , Rfl:  .  furosemide (LASIX) 40 MG tablet, Take 40-80 mg by mouth 2 (two) times daily. Pt takes two tablets in the morning and one tablet in the evening., Disp: , Rfl:  .  loratadine (CLARITIN) 10 MG tablet, Take 1 tablet (10 mg total) by mouth daily., Disp: 30 tablet, Rfl: 0 .  Multiple Vitamin (MULTIVITAMIN WITH MINERALS) TABS tablet, Take 1 tablet by mouth daily. , Disp: , Rfl:  .  omeprazole (PRILOSEC) 20 MG capsule, Take 1 capsule (20 mg total) by mouth daily., Disp: 30 capsule, Rfl: 11 .  ranitidine (ZANTAC) 150 MG tablet, Take 150 mg by mouth at bedtime. , Disp: , Rfl:  .  Rivaroxaban (XARELTO) 15 MG TABS tablet, Take 15 mg by mouth every evening. , Disp: , Rfl:  .  tiotropium (SPIRIVA) 18 MCG inhalation capsule, Place 18 mcg into inhaler and inhale daily., Disp: , Rfl:  .  vitamin B-12 (CYANOCOBALAMIN) 1000 MCG tablet, Take 1,000 mcg by mouth daily., Disp: , Rfl:  .  potassium chloride (K-DUR) 10 MEQ tablet, Take 1 tablet (10 mEq total) by mouth daily., Disp: 30 tablet, Rfl: 12  Allergies  Allergen Reactions  . Penicillins Itching, Swelling, Rash and Other (See Comments)    Reaction:  Arm swelling  Has patient had a PCN reaction causing immediate rash, facial/tongue/throat swelling, SOB or lightheadedness with hypotension: No Has patient had a PCN reaction causing severe rash involving mucus membranes or skin necrosis: No Has patient had a PCN reaction that required hospitalization No Has patient had a PCN reaction  occurring within the last 10 years: No If all of the above answers are "NO", then may proceed with Cephalosporin use.  . Levofloxacin Other (See Comments)    Reaction:  Unknown   . Sulfa Antibiotics Other (See Comments)    Reaction:  Unknown      Review of Systems  Constitutional: Positive for malaise/fatigue. Negative for fever, chills and weight loss.  HENT: Negative for hearing loss.   Eyes: Negative for blurred vision and double vision.  Respiratory: Positive for shortness of breath. Negative for cough, sputum production and wheezing.   Cardiovascular: Negative for chest pain,  palpitations and leg swelling.  Gastrointestinal: Negative for heartburn, abdominal pain and blood in stool.  Genitourinary: Negative for dysuria, urgency and frequency.  Skin: Negative for rash.  Neurological: Positive for weakness. Negative for headaches.      Objective  Filed Vitals:   09/07/15 1558  BP: 126/63  Pulse: 80  Temp: 97.8 F (36.6 C)  TempSrc: Oral  Resp: 16  Height: _0  (1.702 m)  Weight: 161 lb (73.029 kg)    Physical Exam  Constitutional: She is oriented to person, place, and time and well-developed, well-nourished, and in no distress. No distress.  HENT:  Head: Normocephalic and atraumatic.  Eyes: Conjunctivae and EOM are normal. Pupils are equal, round, and reactive to light. No scleral icterus.  Neck: Normal range of motion. Neck supple. Carotid bruit is not present. No thyromegaly present.  Cardiovascular: Normal rate and normal heart sounds.  An irregularly irregular rhythm present. Exam reveals no gallop and no friction rub.   No murmur heard. Pulmonary/Chest: Effort normal. No respiratory distress. She has no wheezes. She has rales (Faint rales in bilateral bases, L>R).  Musculoskeletal: She exhibits edema (trace bilateral pedal edema.).  Lymphadenopathy:    She has no cervical adenopathy.  Neurological: She is alert and oriented to person, place, and time.   Vitals reviewed.      Recent Results (from the past 2160 hour(s))  CBC with Differential     Status: Abnormal   Collection Time: 06/27/15  8:13 AM  Result Value Ref Range   WBC 7.5 3.6 - 11.0 K/uL   RBC 4.15 3.80 - 5.20 MIL/uL   Hemoglobin 12.7 12.0 - 16.0 g/dL   HCT 39.1 35.0 - 47.0 %   MCV 94.1 80.0 - 100.0 fL   MCH 30.5 26.0 - 34.0 pg   MCHC 32.4 32.0 - 36.0 g/dL   RDW 14.3 11.5 - 14.5 %   Platelets 153 150 - 440 K/uL   Neutrophils Relative % 78 %   Neutro Abs 5.9 1.4 - 6.5 K/uL   Lymphocytes Relative 12 %   Lymphs Abs 0.9 (L) 1.0 - 3.6 K/uL   Monocytes Relative 10 %   Monocytes Absolute 0.7 0.2 - 0.9 K/uL   Eosinophils Relative 0 %   Eosinophils Absolute 0.0 0 - 0.7 K/uL   Basophils Relative 0 %   Basophils Absolute 0.0 0 - 0.1 K/uL  Basic metabolic panel     Status: Abnormal   Collection Time: 06/27/15  8:13 AM  Result Value Ref Range   Sodium 133 (L) 135 - 145 mmol/L   Potassium 3.8 3.5 - 5.1 mmol/L    Comment: HEMOLYSIS AT THIS LEVEL MAY AFFECT RESULT   Chloride 88 (L) 101 - 111 mmol/L   CO2 36 (H) 22 - 32 mmol/L   Glucose, Bld 169 (H) 65 - 99 mg/dL   BUN 15 6 - 20 mg/dL   Creatinine, Ser 1.11 (H) 0.44 - 1.00 mg/dL   Calcium 8.7 (L) 8.9 - 10.3 mg/dL   GFR calc non Af Amer 42 (L) >60 mL/min   GFR calc Af Amer 49 (L) >60 mL/min    Comment: (NOTE) The eGFR has been calculated using the CKD EPI equation. This calculation has not been validated in all clinical situations. eGFR's persistently <60 mL/min signify possible Chronic Kidney Disease.    Anion gap 9 5 - 15  Troponin I     Status: None   Collection Time: 06/27/15  8:13 AM  Result Value Ref  Range   Troponin I 0.03 <0.031 ng/mL    Comment:        NO INDICATION OF MYOCARDIAL INJURY.   Troponin I     Status: Abnormal   Collection Time: 06/27/15  3:16 PM  Result Value Ref Range   Troponin I 0.14 (H) <0.031 ng/mL    Comment: READ BACK AND VERIFIED WITH ABIGAIL JACKSON ON 06/27/15 AT 1658PM BY TLB         PERSISTENTLY INCREASED TROPONIN VALUES IN THE RANGE OF 0.04-0.49 ng/mL CAN BE SEEN IN:       -UNSTABLE ANGINA       -CONGESTIVE HEART FAILURE       -MYOCARDITIS       -CHEST TRAUMA       -ARRYHTHMIAS       -LATE PRESENTING MYOCARDIAL INFARCTION       -COPD   CLINICAL FOLLOW-UP RECOMMENDED.   Influenza panel by PCR (type A & B, H1N1)     Status: Abnormal   Collection Time: 06/27/15  3:39 PM  Result Value Ref Range   Influenza A By PCR POSITIVE (A) NEGATIVE   Influenza B By PCR NEGATIVE NEGATIVE   H1N1 flu by pcr NOT DETECTED NOT DETECTED    Comment:        The Xpert Flu assay (FDA approved for nasal aspirates or washes and nasopharyngeal swab specimens), is intended as an aid in the diagnosis of influenza and should not be used as a sole basis for treatment.   Troponin I     Status: Abnormal   Collection Time: 06/27/15  5:42 PM  Result Value Ref Range   Troponin I 0.14 (H) <0.031 ng/mL    Comment: PREVIOUS RESULT CALLED ABIGAIL JACKSON ON 06/27/15 AT 1658PM BY TLB        PERSISTENTLY INCREASED TROPONIN VALUES IN THE RANGE OF 0.04-0.49 ng/mL CAN BE SEEN IN:       -UNSTABLE ANGINA       -CONGESTIVE HEART FAILURE       -MYOCARDITIS       -CHEST TRAUMA       -ARRYHTHMIAS       -LATE PRESENTING MYOCARDIAL INFARCTION       -COPD   CLINICAL FOLLOW-UP RECOMMENDED.   Basic metabolic panel     Status: Abnormal   Collection Time: 06/28/15  5:53 AM  Result Value Ref Range   Sodium 137 135 - 145 mmol/L   Potassium 4.1 3.5 - 5.1 mmol/L   Chloride 93 (L) 101 - 111 mmol/L   CO2 35 (H) 22 - 32 mmol/L   Glucose, Bld 260 (H) 65 - 99 mg/dL   BUN 24 (H) 6 - 20 mg/dL   Creatinine, Ser 1.19 (H) 0.44 - 1.00 mg/dL   Calcium 8.5 (L) 8.9 - 10.3 mg/dL   GFR calc non Af Amer 39 (L) >60 mL/min   GFR calc Af Amer 45 (L) >60 mL/min    Comment: (NOTE) The eGFR has been calculated using the CKD EPI equation. This calculation has not been validated in all clinical situations. eGFR's  persistently <60 mL/min signify possible Chronic Kidney Disease.    Anion gap 9 5 - 15  CBC     Status: Abnormal   Collection Time: 06/28/15  5:53 AM  Result Value Ref Range   WBC 10.9 3.6 - 11.0 K/uL   RBC 3.94 3.80 - 5.20 MIL/uL   Hemoglobin 12.4 12.0 - 16.0 g/dL   HCT 37.8 35.0 - 47.0 %  MCV 95.9 80.0 - 100.0 fL   MCH 31.4 26.0 - 34.0 pg   MCHC 32.7 32.0 - 36.0 g/dL   RDW 14.7 (H) 11.5 - 14.5 %   Platelets 161 150 - 440 K/uL  Glucose, capillary     Status: Abnormal   Collection Time: 06/30/15  7:25 AM  Result Value Ref Range   Glucose-Capillary 111 (H) 65 - 99 mg/dL  Comprehensive metabolic panel     Status: Abnormal   Collection Time: 08/23/15  6:50 PM  Result Value Ref Range   Sodium 138 135 - 145 mmol/L   Potassium 3.4 (L) 3.5 - 5.1 mmol/L   Chloride 95 (L) 101 - 111 mmol/L   CO2 36 (H) 22 - 32 mmol/L   Glucose, Bld 185 (H) 65 - 99 mg/dL   BUN 15 6 - 20 mg/dL   Creatinine, Ser 1.04 (H) 0.44 - 1.00 mg/dL   Calcium 8.9 8.9 - 10.3 mg/dL   Total Protein 7.1 6.5 - 8.1 g/dL   Albumin 3.9 3.5 - 5.0 g/dL   AST 23 15 - 41 U/L   ALT 11 (L) 14 - 54 U/L   Alkaline Phosphatase 65 38 - 126 U/L   Total Bilirubin 0.3 0.3 - 1.2 mg/dL   GFR calc non Af Amer 46 (L) >60 mL/min   GFR calc Af Amer 53 (L) >60 mL/min    Comment: (NOTE) The eGFR has been calculated using the CKD EPI equation. This calculation has not been validated in all clinical situations. eGFR's persistently <60 mL/min signify possible Chronic Kidney Disease.    Anion gap 7 5 - 15  Troponin I     Status: None   Collection Time: 08/23/15  6:50 PM  Result Value Ref Range   Troponin I <0.03 <0.031 ng/mL    Comment:        NO INDICATION OF MYOCARDIAL INJURY.   CBC with Differential     Status: Abnormal   Collection Time: 08/23/15  6:50 PM  Result Value Ref Range   WBC 5.2 3.6 - 11.0 K/uL   RBC 3.65 (L) 3.80 - 5.20 MIL/uL   Hemoglobin 11.5 (L) 12.0 - 16.0 g/dL   HCT 34.7 (L) 35.0 - 47.0 %   MCV 95.2 80.0 -  100.0 fL   MCH 31.5 26.0 - 34.0 pg   MCHC 33.1 32.0 - 36.0 g/dL   RDW 14.7 (H) 11.5 - 14.5 %   Platelets 202 150 - 440 K/uL   Neutrophils Relative % 56 %   Neutro Abs 2.9 1.4 - 6.5 K/uL   Lymphocytes Relative 29 %   Lymphs Abs 1.5 1.0 - 3.6 K/uL   Monocytes Relative 11 %   Monocytes Absolute 0.6 0.2 - 0.9 K/uL   Eosinophils Relative 3 %   Eosinophils Absolute 0.1 0 - 0.7 K/uL   Basophils Relative 1 %   Basophils Absolute 0.0 0 - 0.1 K/uL  Brain natriuretic peptide     Status: Abnormal   Collection Time: 08/23/15  6:50 PM  Result Value Ref Range   B Natriuretic Peptide 214.0 (H) 0.0 - 100.0 pg/mL  TSH     Status: None   Collection Time: 08/23/15  6:50 PM  Result Value Ref Range   TSH 1.297 0.350 - 4.500 uIU/mL  Lactic acid, plasma     Status: None   Collection Time: 08/23/15  6:51 PM  Result Value Ref Range   Lactic Acid, Venous 1.9 0.5 - 2.0 mmol/L  Troponin  I     Status: None   Collection Time: 08/23/15  9:04 PM  Result Value Ref Range   Troponin I <0.03 <0.031 ng/mL    Comment:        NO INDICATION OF MYOCARDIAL INJURY.   Lactic acid, plasma     Status: None   Collection Time: 08/23/15  9:26 PM  Result Value Ref Range   Lactic Acid, Venous 1.4 0.5 - 2.0 mmol/L  Hemoglobin A1c     Status: Abnormal   Collection Time: 08/24/15  3:56 AM  Result Value Ref Range   Hgb A1c MFr Bld 7.1 (H) 4.0 - 6.0 %     Assessment & Plan  Problem List Items Addressed This Visit      Cardiovascular and Mediastinum   Acute systolic CHF (congestive heart failure) (HCC)   Relevant Medications   potassium chloride (K-DUR) 10 MEQ tablet   Other Relevant Orders   Comprehensive Metabolic Panel (CMET)   CBC with Differential   Ambulatory referral to Cardiology   Atrial fibrillation [facility])   Relevant Orders   Ambulatory referral to Cardiology   Essential hypertension     Respiratory   COPD with acute exacerbation ([place]) - Primary      Meds ordered this encounter  Medications  .  potassium chloride (K-DUR) 10 MEQ tablet    Sig: Take 1 tablet (10 mEq total) by mouth daily.    Dispense:  30 tablet    Refill:  12  1. COPD with acute exacerbation (HCC) Cont. meds  2. Acute systolic CHF (congestive heart failure) (HCC) Cont meds - Comprehensive Metabolic Panel (CMET) - CBC with Differential - Ambulatory referral to Cardiology - potassium chloride (K-DUR) 10 MEQ tablet; Take 1 tablet (10 mEq total) by mouth daily.  Dispense: 30 tablet; Refill: 12  3. Persistent atrial fibrillation ([place]) Cont meds - Ambulatory referral to Cardiology  4. Essential hypertension Cont. meds.

## 2015-09-08 DIAGNOSIS — K219 Gastro-esophageal reflux disease without esophagitis: Secondary | ICD-10-CM | POA: Diagnosis not present

## 2015-09-08 DIAGNOSIS — Z853 Personal history of malignant neoplasm of breast: Secondary | ICD-10-CM | POA: Diagnosis not present

## 2015-09-08 DIAGNOSIS — I4891 Unspecified atrial fibrillation: Secondary | ICD-10-CM | POA: Diagnosis not present

## 2015-09-08 DIAGNOSIS — Z79899 Other long term (current) drug therapy: Secondary | ICD-10-CM | POA: Diagnosis not present

## 2015-09-08 DIAGNOSIS — I130 Hypertensive heart and chronic kidney disease with heart failure and stage 1 through stage 4 chronic kidney disease, or unspecified chronic kidney disease: Secondary | ICD-10-CM | POA: Diagnosis not present

## 2015-09-08 DIAGNOSIS — I252 Old myocardial infarction: Secondary | ICD-10-CM | POA: Diagnosis not present

## 2015-09-08 DIAGNOSIS — I739 Peripheral vascular disease, unspecified: Secondary | ICD-10-CM | POA: Diagnosis not present

## 2015-09-08 DIAGNOSIS — I5021 Acute systolic (congestive) heart failure: Secondary | ICD-10-CM | POA: Diagnosis not present

## 2015-09-08 DIAGNOSIS — N189 Chronic kidney disease, unspecified: Secondary | ICD-10-CM | POA: Diagnosis not present

## 2015-09-08 DIAGNOSIS — J441 Chronic obstructive pulmonary disease with (acute) exacerbation: Secondary | ICD-10-CM | POA: Diagnosis not present

## 2015-09-09 LAB — COMPREHENSIVE METABOLIC PANEL
ALT: 9 IU/L (ref 0–32)
AST: 17 IU/L (ref 0–40)
Albumin/Globulin Ratio: 1.3 (ref 1.2–2.2)
Albumin: 3.9 g/dL (ref 3.2–4.6)
Alkaline Phosphatase: 86 IU/L (ref 39–117)
BUN / CREAT RATIO: 15 (ref 11–26)
BUN: 18 mg/dL (ref 10–36)
Bilirubin Total: 0.4 mg/dL (ref 0.0–1.2)
CALCIUM: 9.3 mg/dL (ref 8.7–10.3)
CHLORIDE: 90 mmol/L — AB (ref 96–106)
CO2: 33 mmol/L — ABNORMAL HIGH (ref 18–29)
Creatinine, Ser: 1.19 mg/dL — ABNORMAL HIGH (ref 0.57–1.00)
GFR, EST AFRICAN AMERICAN: 46 mL/min/{1.73_m2} — AB (ref >59)
GFR, EST NON AFRICAN AMERICAN: 40 mL/min/{1.73_m2} — AB (ref >59)
GLUCOSE: 156 mg/dL — AB (ref 65–99)
Globulin, Total: 3.1 g/dL (ref 1.5–4.5)
POTASSIUM: 3.6 mmol/L (ref 3.5–5.2)
Sodium: 141 mmol/L (ref 134–144)
TOTAL PROTEIN: 7 g/dL (ref 6.0–8.5)

## 2015-09-09 LAB — CBC WITH DIFFERENTIAL/PLATELET
BASOS ABS: 0 10*3/uL (ref 0.0–0.2)
BASOS: 0 %
EOS (ABSOLUTE): 0.1 10*3/uL (ref 0.0–0.4)
Eos: 1 %
HEMOGLOBIN: 11.9 g/dL (ref 11.1–15.9)
Hematocrit: 36.1 % (ref 34.0–46.6)
Immature Grans (Abs): 0 10*3/uL (ref 0.0–0.1)
Immature Granulocytes: 0 %
LYMPHS ABS: 2.4 10*3/uL (ref 0.7–3.1)
Lymphs: 27 %
MCH: 31 pg (ref 26.6–33.0)
MCHC: 33 g/dL (ref 31.5–35.7)
MCV: 94 fL (ref 79–97)
Monocytes Absolute: 0.7 10*3/uL (ref 0.1–0.9)
Monocytes: 8 %
NEUTROS ABS: 5.5 10*3/uL (ref 1.4–7.0)
Neutrophils: 64 %
PLATELETS: 215 10*3/uL (ref 150–379)
RBC: 3.84 x10E6/uL (ref 3.77–5.28)
RDW: 14.7 % (ref 12.3–15.4)
WBC: 8.7 10*3/uL (ref 3.4–10.8)

## 2015-09-11 ENCOUNTER — Other Ambulatory Visit: Payer: Self-pay | Admitting: *Deleted

## 2015-09-11 ENCOUNTER — Encounter: Payer: Self-pay | Admitting: *Deleted

## 2015-09-11 NOTE — Patient Outreach (Signed)
[facility] ([facility]) Care Management   09/11/2015  [name] [name] 07/19/1922 [id number]  [name] [name] is an 82 y.o. female   Transition of care home visit.  Subjective: " My breathing is good on today and I am feeling better"  Objective:  BP 128/60 mmHg  Pulse 80  Resp 20  SpO2 94% Review of Systems  Constitutional: Negative.   HENT: Negative.   Eyes: Negative.   Respiratory: Negative.  Negative for shortness of breath.   Cardiovascular: Positive for leg swelling.       Slight edema bilateral ankle area  Gastrointestinal: Negative.   Genitourinary: Negative.   Musculoskeletal: Negative for falls.  Skin: Negative.   Neurological: Negative.   Endo/Heme/Allergies: Negative.   Psychiatric/Behavioral: Negative.     Physical Exam  Constitutional: She is oriented to person, place, and time. She appears well-developed and well-nourished.  Cardiovascular: Normal rate.   Respiratory: Effort normal.  GI: Soft.  Neurological: She is alert and oriented to person, place, and time.  Skin: Skin is warm and dry.     Psychiatric: She has a normal mood and affect. Her behavior is normal. Judgment and thought content normal.    Current Medications:   Current Outpatient Prescriptions  Medication Sig Dispense Refill  . albuterol (PROVENTIL HFA;VENTOLIN HFA) 108 (90 Base) MCG/ACT inhaler Inhale 1-2 puffs into the lungs every 6 (six) hours as needed for wheezing or shortness of breath. 18 g 3  . albuterol (PROVENTIL) (2.5 MG/3ML) 0.083% nebulizer solution Take 3 mLs (2.5 mg total) by nebulization every 6 (six) hours as needed for wheezing or shortness of breath. 120 mL 12  . carvedilol (COREG) 6.25 MG tablet Take 6.25 mg by mouth 2 (two) times daily with a meal.    . Fluticasone-Salmeterol (ADVAIR) 250-50 MCG/DOSE AEPB Inhale 1 puff into the lungs 2 (two) times daily.    . furosemide (LASIX) 40 MG tablet Take 40-80 mg by mouth 2 (two) times daily. Pt takes two tablets in the morning  and one tablet in the evening.    . loratadine (CLARITIN) 10 MG tablet Take 1 tablet (10 mg total) by mouth daily. 30 tablet 0  . Multiple Vitamin (MULTIVITAMIN WITH MINERALS) TABS tablet Take 1 tablet by mouth daily.     . omeprazole (PRILOSEC) 20 MG capsule Take 1 capsule (20 mg total) by mouth daily. 30 capsule 11  . potassium chloride (K-DUR) 10 MEQ tablet Take 1 tablet (10 mEq total) by mouth daily. 30 tablet 12  . ranitidine (ZANTAC) 150 MG tablet Take 150 mg by mouth at bedtime.     . Rivaroxaban (XARELTO) 15 MG TABS tablet Take 15 mg by mouth every evening.     . tiotropium (SPIRIVA) 18 MCG inhalation capsule Place 18 mcg into inhaler and inhale daily.    . vitamin B-12 (CYANOCOBALAMIN) 1000 MCG tablet Take 1,000 mcg by mouth daily.     No current facility-administered medications for this visit.    Functional Status:   In your present state of health, do you have any difficulty performing the following activities: 09/11/2015 08/24/2015  Hearing? Y Y  Vision? N N  Difficulty concentrating or making decisions? N N  Walking or climbing stairs? N N  Dressing or bathing? Y Y  Doing errands, shopping? N N  Preparing Food and eating ? N -  Using the Toilet? N -  In the past six months, have you accidently leaked urine? Y -  Do you have problems with   loss of bowel control? N -  Managing your Medications? N -  Managing your Finances? N -  Housekeeping or managing your Housekeeping? Y -    Fall/Depression Screening:    PHQ 2/9 Scores 09/11/2015 08/10/2015 08/03/2015 07/07/2015 05/16/2015 04/24/2015 03/15/2015  PHQ - 2 Score 0 0 0 0 0 0 0    Assessment:  Routine home visit, patient resting on her couch. Patient discussed her recent surprise birthday celebration. Patient is still followed by Home Health RN, PT and patient states occupational therapy will begin seeing her.  COPD Patient reports her breathing is in the green zone, she continues to wear her oxygen at 3 liters use her nebulize  treatments as scheduled. Patient able to state symptoms in yellow zone, actions to take for worsening symptoms.   Congestive Heart Failure Patient denies increased swelling,noted ankle edema patient reports no increased. Patient weights on her scales more than [employer] scales, because they are in her bathroom, and [employer] scale plugged in at her kitchen. Reviewed benefits of using [employer] scales, reviewed with patient how to use and able to go through demonstration with her. Patient reports she continues to watch the salt in her diet, using frozen vegetables now more than can.  Plan:  Patient will use [employer] scale to weigh on daily. [facility] CM Care Plan Problem One        Most Recent Value   Care Plan Problem One  Patient with recent hospital admission   Role Documenting the Problem One  Care Management Coordinator   Care Plan for Problem One  Active   [facility] Long Term Goal (31-90 days)  Patient will not readmit to hospital in the next 31 days   [facility] Long Term Goal Start Date  08/28/15   Interventions for Problem One Long Term Goal  Reviewed importance of calling MD for worsening symptoms sooner to arrange office visit., reintroduced to transition of care care program   [facility] CM Short Term Goal #1 (0-30 days)  Patient will attend PCP appointment in the next 7 days   [facility] CM Short Term Goal #1 Start Date  08/28/15   [facility] CM Short Term Goal #1 Met Date  09/11/15   [facility] CM Short Term Goal #2 (0-30 days)  Patient will weight daily and record in the next 30 days using [employer] scale program [goal revised]   [facility] CM Short Term Goal #2 Start Date  08/28/15 [goal date restarted]   Interventions for Short Term Goal #2  Educated on benefits of using [employer] scale program,Reviewed patient on the importance of monitoring and keeping a record of daily weights, to help track and small weight increases sooner   [facility] CM Short Term Goal #3 (0-30 days)  Patient will report participating in physical therapy taught exercises at least 5  days a week at tolerated in the next 30 days   [facility] CM Short Term Goal #3 Start Date  09/04/15   Interventions for Short Tern Goal #3  Reviewed with patient on the benefits of exercises to increase strength and balance   [facility] CM Short Term Goal #4 (0-30 days)  Patient will review and COPD zones and state 3 s/s of yellow zone and action plan in the next 30 days   [facility] CM Short Term Goal #4 Start Date  09/04/15   [facility] CM Short Term Goal #4 Met Date  09/04/15   [facility] CM Short Term Goal #5 (0-30 days)  Patient will be able to state   2 s/s of Heart failure yellow zone symptoms and action plan in the next 30 days   [facility] CM Short Term Goal #5 Start Date  09/04/15   [facility] CM Short Term Goal #5 Met Date  09/11/15     Joylene Draft, RN, [place] Care Management [phone]- Mobile [phone]- [place]

## 2015-09-12 ENCOUNTER — Other Ambulatory Visit: Payer: Self-pay | Admitting: Family Medicine

## 2015-09-12 DIAGNOSIS — N189 Chronic kidney disease, unspecified: Secondary | ICD-10-CM | POA: Diagnosis not present

## 2015-09-12 DIAGNOSIS — J441 Chronic obstructive pulmonary disease with (acute) exacerbation: Secondary | ICD-10-CM | POA: Diagnosis not present

## 2015-09-12 DIAGNOSIS — I252 Old myocardial infarction: Secondary | ICD-10-CM | POA: Diagnosis not present

## 2015-09-12 DIAGNOSIS — K219 Gastro-esophageal reflux disease without esophagitis: Secondary | ICD-10-CM | POA: Diagnosis not present

## 2015-09-12 DIAGNOSIS — I739 Peripheral vascular disease, unspecified: Secondary | ICD-10-CM | POA: Diagnosis not present

## 2015-09-12 DIAGNOSIS — I5021 Acute systolic (congestive) heart failure: Secondary | ICD-10-CM | POA: Diagnosis not present

## 2015-09-12 DIAGNOSIS — I4891 Unspecified atrial fibrillation: Secondary | ICD-10-CM | POA: Diagnosis not present

## 2015-09-12 DIAGNOSIS — I130 Hypertensive heart and chronic kidney disease with heart failure and stage 1 through stage 4 chronic kidney disease, or unspecified chronic kidney disease: Secondary | ICD-10-CM | POA: Diagnosis not present

## 2015-09-12 DIAGNOSIS — Z853 Personal history of malignant neoplasm of breast: Secondary | ICD-10-CM | POA: Diagnosis not present

## 2015-09-12 DIAGNOSIS — Z79899 Other long term (current) drug therapy: Secondary | ICD-10-CM | POA: Diagnosis not present

## 2015-09-14 ENCOUNTER — Encounter: Payer: Self-pay | Admitting: Pulmonary Disease

## 2015-09-14 ENCOUNTER — Ambulatory Visit (INDEPENDENT_AMBULATORY_CARE_PROVIDER_SITE_OTHER): Payer: Medicare Other | Admitting: Pulmonary Disease

## 2015-09-14 VITALS — BP 104/50 | HR 82 | Ht 68.0 in | Wt 162.4 lb

## 2015-09-14 DIAGNOSIS — J449 Chronic obstructive pulmonary disease, unspecified: Secondary | ICD-10-CM

## 2015-09-14 DIAGNOSIS — J45909 Unspecified asthma, uncomplicated: Secondary | ICD-10-CM

## 2015-09-14 DIAGNOSIS — R0902 Hypoxemia: Secondary | ICD-10-CM | POA: Diagnosis not present

## 2015-09-14 DIAGNOSIS — Z87891 Personal history of nicotine dependence: Secondary | ICD-10-CM | POA: Diagnosis not present

## 2015-09-14 DIAGNOSIS — R0600 Dyspnea, unspecified: Secondary | ICD-10-CM

## 2015-09-14 MED ORDER — CETIRIZINE HCL 10 MG PO TABS: 10.0000 mg | ORAL_TABLET | Freq: Every day | ORAL | Status: DC

## 2015-09-15 ENCOUNTER — Telehealth: Payer: Self-pay | Admitting: Family Medicine

## 2015-09-15 ENCOUNTER — Ambulatory Visit: Payer: Medicare Other | Admitting: Family

## 2015-09-15 DIAGNOSIS — J441 Chronic obstructive pulmonary disease with (acute) exacerbation: Secondary | ICD-10-CM | POA: Diagnosis not present

## 2015-09-15 DIAGNOSIS — Z853 Personal history of malignant neoplasm of breast: Secondary | ICD-10-CM | POA: Diagnosis not present

## 2015-09-15 DIAGNOSIS — I739 Peripheral vascular disease, unspecified: Secondary | ICD-10-CM | POA: Diagnosis not present

## 2015-09-15 DIAGNOSIS — I130 Hypertensive heart and chronic kidney disease with heart failure and stage 1 through stage 4 chronic kidney disease, or unspecified chronic kidney disease: Secondary | ICD-10-CM | POA: Diagnosis not present

## 2015-09-15 DIAGNOSIS — J0110 Acute frontal sinusitis, unspecified: Secondary | ICD-10-CM

## 2015-09-15 DIAGNOSIS — Z79899 Other long term (current) drug therapy: Secondary | ICD-10-CM | POA: Diagnosis not present

## 2015-09-15 DIAGNOSIS — N189 Chronic kidney disease, unspecified: Secondary | ICD-10-CM | POA: Diagnosis not present

## 2015-09-15 DIAGNOSIS — I4891 Unspecified atrial fibrillation: Secondary | ICD-10-CM | POA: Diagnosis not present

## 2015-09-15 DIAGNOSIS — I5021 Acute systolic (congestive) heart failure: Secondary | ICD-10-CM | POA: Diagnosis not present

## 2015-09-15 DIAGNOSIS — I252 Old myocardial infarction: Secondary | ICD-10-CM | POA: Diagnosis not present

## 2015-09-15 DIAGNOSIS — K219 Gastro-esophageal reflux disease without esophagitis: Secondary | ICD-10-CM | POA: Diagnosis not present

## 2015-09-15 MED ORDER — DM-GUAIFENESIN ER 30-600 MG PO TB12: 1.0000 | ORAL_TABLET | Freq: Two times a day (BID) | ORAL | Status: DC

## 2015-09-15 MED ORDER — DOXYCYCLINE HYCLATE 100 MG PO TABS: 100.0000 mg | ORAL_TABLET | Freq: Two times a day (BID) | ORAL | Status: DC

## 2015-09-15 NOTE — Telephone Encounter (Signed)
Home health nurse called- very congested. Sinus pain since Sunday. Worsened on Wednesday. Facial pain and pressure. Daughter in law checks on her every other day. Seen in pulmonology yesterday. Pulm said to take zyrtec. No chest tightness or trouble  Breathing. SpO2 is 92% on 2-3L [place] and this is her baseline.  Treat with doxy given allergies- No dosage adjustment necessary for renal impairment.  Has flonase spray at home- been using. Nasal drainage is clear. Face is painful and tender above the eyes. Sinus HA.  Will treat empirically for sinus infection. Home health with check on her on Monday. Family will check on her over the weekend. Any chest pain, shortness of breath, increasing inhaler use- must be seen in ER or urgent care over the week. She will follow with [facility] if not improving.

## 2015-09-15 NOTE — Progress Notes (Signed)
PULMONARY CONSULT NOTE  Requesting MD/Service: Larene Beach, MD Date of initial consultation: 09/14/15 Reason for consultation: COPD  PT PROFILE: 82 y.o. F with extensive smoking history referred for evaluation of dyspnea after hospitalization 03/15 - 08/25/15 for acute on chronic respiratory failure thought due to COPD and CHF  HPI:  28 F former smoker with presumptive diagnosis of COPD and with chronic dyspnea. She was hospitalized 03/15 - 03/17 with acute resp distress which was ultimately diagnosed as COPD and CHF. She was discharged on Advair, Spiriva and O2 by [place]. She has now recovered to her previous baseline and reports class 2/3 dyspnea (dyspnea with ADLs). Her baseline dyspnea has gradually worsened over the years. She occasionally has paroxysmal episodes. She has minimal cough, no hemoptysis or CP. She has had intermittent LE edema in the past, none presently. She is presently satisfied with her current control of her respiratory symptoms and overall feels much better than when she was admitted.  Past Medical History  Diagnosis Date  . HTN (hypertension)   . COPD (chronic obstructive pulmonary disease) (HCC)     a. on home O2 nightly.  . Diverticulosis   . Takotsubo cardiomyopathy 10/2012    a. 10/2012: presented w/ cardiogenic shock/systolic CHF/VDRF - EF 123456 by cath, 40% by f/u echo - cath with mild nonobstructive CAD. b. Not discharged on ACEI due to hypotension.  . Cardiogenic shock ([place])     a. 10/2012 - due to Takotsubo CM.  [name] Respiratory failure ([place])     a. On home O2 nightly. b. VDRF 10/2012 - due to CAP/pulm edema in setting of cardiogenic shock/Takotsubo CM.  [name] CAP (community acquired pneumonia)     a. 10/2012 - will need f/u CXR in June 2014 to ensure clearance.  . Esophageal stricture     a. s/p dilitation 10/2012.  [name] Pleural effusion, right     a. s/p thoracentesis 10/28/12.  . Atrial fibrillation ([place])     a. Dx 10/2012, rate controlled, started on Xarelto.  .  Claudication ([place])     a. ABI 10/2012 - R 0.87, L 0.74.  [name] GERD (gastroesophageal reflux disease)   . CKD (chronic kidney disease), stage III   . Breast CA (HCC)     hx  . Abdominal hernia     chronic  . STEMI (ST elevation myocardial infarction) ([place])   . Cellulitis   . CHF (congestive heart failure) ([place])   . CAD (coronary artery disease)     a. 10/2012 - presented w/ STEMI felt due to Takotsubo - mild nonobstructive dz by cath.    Past Surgical History  Procedure Laterality Date  . Mastectomy    . Cataract extraction    . Esophagogastroduodenoscopy N/A 11/05/2012    Procedure: ESOPHAGOGASTRODUODENOSCOPY (EGD) with esophageal savary dilatation;  Surgeon: Missy Sabins, MD;  Location: [place];  Service: Endoscopy;  Laterality: N/A;  savary dil...no floro needed  . Bladder surgery    . Left heart catheterization with coronary angiogram N/A 10/24/2012    Procedure: LEFT HEART CATHETERIZATION WITH CORONARY ANGIOGRAM;  Surgeon: Sherren Mocha, MD;  Location: [facility] CATH LAB;  Service: Cardiovascular;  Laterality: N/A;  . Peripheral vascular catheterization N/A 02/07/2015    Procedure: Abdominal Aortogram w/Lower Extremity;  Surgeon: Katha Cabal, MD;  Location: [place] CV LAB;  Service: Cardiovascular;  Laterality: N/A;  . Peripheral vascular catheterization  02/07/2015    Procedure: Lower Extremity Intervention;  Surgeon: Katha Cabal, MD;  Location: [place]  CV LAB;  Service: Cardiovascular;;  . Coronary angioplasty    . Esophagogastroduodenoscopy (egd) with propofol N/A 05/23/2015    Procedure: ESOPHAGOGASTRODUODENOSCOPY (EGD) WITH PROPOFOL;  Surgeon: Lucilla Lame, MD;  Location: [facility] ENDOSCOPY;  Service: Endoscopy;  Laterality: N/A;    MEDICATIONS: I have reviewed all medications and confirmed regimen as documented  Social History   Social History  . Marital Status: Widowed    Spouse Name: N/A  . Number of Children: 1  . Years of Education: N/A   Occupational  History  . Not on file.   Social History Main Topics  . Smoking status: Former Smoker -- 1.00 packs/day for 55 years    Types: Cigarettes    Quit date: 06/15/2007  . Smokeless tobacco: Former Systems developer  . Alcohol Use: No  . Drug Use: No  . Sexual Activity: No   Other Topics Concern  . Not on file   Social History Narrative    Family History  Problem Relation Age of Onset  . Heart attack Mother   . Stroke Mother   . COPD Son   . Pneumonia Father     ROS: No fever, myalgias/arthralgias, unexplained weight loss or weight gain No new focal weakness or sensory deficits No otalgia, hearing loss, visual changes, nasal and sinus symptoms, mouth and throat problems No neck pain or adenopathy No abdominal pain, N/V/D, diarrhea, change in bowel pattern No dysuria, change in urinary pattern No LE edema or calf tenderness   Filed Vitals:   09/14/15 0933  BP: 104/50  Pulse: 82  Height: 5\' 8"  (1.727 m)  Weight: 162 lb 6.4 oz (73.664 kg)  SpO2: 90%  Room air   EXAM:  Gen: Appears younger than her true age, No overt respiratory distress HEENT: NCAT, sclera white, oropharynx normal Neck: Supple without LAN, thyromegaly, JVD Lungs: breath sounds mildly diminished, percussion note normal, No wheezes or other adventitious sounds Cardiovascular: Normal rate, reg rhythm, no murmurs noted Abdomen: Soft, nontender, normal BS Ext: without clubbing, cyanosis, edema Neuro: CNs grossly intact, motor and sensory intact, DTRs symmetric Skin: Limited exam, no lesions noted  DATA:   BMP Latest Ref Rng 09/08/2015 08/23/2015 06/28/2015  Glucose 65 - 99 mg/dL 156(H) 185(H) 260(H)  BUN 10 - 36 mg/dL 18 15 24(H)  Creatinine 0.57 - 1.00 mg/dL 1.19(H) 1.04(H) 1.19(H)  BUN/Creat Ratio 11 - 26 15 - -  Sodium 134 - 144 mmol/L 141 138 137  Potassium 3.5 - 5.2 mmol/L 3.6 3.4(L) 4.1  Chloride 96 - 106 mmol/L 90(L) 95(L) 93(L)  CO2 18 - 29 mmol/L 33(H) 36(H) 35(H)  Calcium 8.7 - 10.3 mg/dL 9.3 8.9  8.5(L)    CBC Latest Ref Rng 09/08/2015 08/23/2015 06/28/2015  WBC 3.4 - 10.8 x10E3/uL 8.7 5.2 10.9  Hemoglobin 12.0 - 16.0 g/dL - 11.5(L) 12.4  Hematocrit 34.0 - 46.6 % 36.1 34.7(L) 37.8  Platelets 150 - 379 x10E3/uL 215 202 161    CXR (08/23/15): Hyperinflated, LUL retraction, NAD   Echocardiogram 04/12/15: LVEF 60-65%, LA mildly dilated  IMPRESSION:     ICD-9-CM ICD-10-CM   1. Chronic asthmatic bronchitis (HCC) 493.20 J44.9     J45.909   2. Former smoker V15.82 Z87.891   3. Dyspnea 786.09 R06.00   4. Hypoxemia 799.02 R09.02      PLAN:  Continue Advair, Spiriva, PRN albuterol Encouraged that she wear O2 as previously prescribed for a minimum of 18 hrs per day Change loratidine to cetirizine @ her request ROV 6 months or PRN  Wilhelmina Mcardle, MD [place] [facility], Critical Care Medicine

## 2015-09-19 ENCOUNTER — Other Ambulatory Visit: Payer: Self-pay | Admitting: *Deleted

## 2015-09-19 DIAGNOSIS — Z79899 Other long term (current) drug therapy: Secondary | ICD-10-CM | POA: Diagnosis not present

## 2015-09-19 DIAGNOSIS — I739 Peripheral vascular disease, unspecified: Secondary | ICD-10-CM | POA: Diagnosis not present

## 2015-09-19 DIAGNOSIS — K219 Gastro-esophageal reflux disease without esophagitis: Secondary | ICD-10-CM | POA: Diagnosis not present

## 2015-09-19 DIAGNOSIS — I5021 Acute systolic (congestive) heart failure: Secondary | ICD-10-CM | POA: Diagnosis not present

## 2015-09-19 DIAGNOSIS — J441 Chronic obstructive pulmonary disease with (acute) exacerbation: Secondary | ICD-10-CM | POA: Diagnosis not present

## 2015-09-19 DIAGNOSIS — I4891 Unspecified atrial fibrillation: Secondary | ICD-10-CM | POA: Diagnosis not present

## 2015-09-19 DIAGNOSIS — J449 Chronic obstructive pulmonary disease, unspecified: Secondary | ICD-10-CM | POA: Diagnosis not present

## 2015-09-19 DIAGNOSIS — N189 Chronic kidney disease, unspecified: Secondary | ICD-10-CM | POA: Diagnosis not present

## 2015-09-19 DIAGNOSIS — Z853 Personal history of malignant neoplasm of breast: Secondary | ICD-10-CM | POA: Diagnosis not present

## 2015-09-19 DIAGNOSIS — I130 Hypertensive heart and chronic kidney disease with heart failure and stage 1 through stage 4 chronic kidney disease, or unspecified chronic kidney disease: Secondary | ICD-10-CM | POA: Diagnosis not present

## 2015-09-19 DIAGNOSIS — I252 Old myocardial infarction: Secondary | ICD-10-CM | POA: Diagnosis not present

## 2015-09-19 NOTE — Patient Outreach (Addendum)
[facility]) Care Management  09/19/2015  [name] [name] [birth year] [id number]   Transition of care week #4  RN spoke with [name] [name] , she reports feeling better than a few days ago,reports decreased nasal congestion, breathing clearer. Patient reports she still a few more days of antibiotics to day, educated on the importance of completing full prescription.  Heart Failure/COPD [name] [name] reports recent weight using [employer] scales is 157 on yesterday, patient states just a tiny bit of swelling in left leg, denies increase in weight gain, shortness of breath, no increase in cough or sputum production. Patient reports taking at least 3 nebulizer treatments per day. Reinforced with patient signs and symptoms of worsening  heart failure and copd to notify MD of , patient able to verbalize. Reinforced importance of weighing and recording on a daily basis.  [name] [name] is still followed by Home Health RN, PT, OT, patient reports she is able to participate with therapies. Reminded patient of Heart Failure Clinic appointment  4/12, patient denies concern for transportation. Discussed transfer to health coach for continued management of her COPD at the end of transition of care .  Plan Patient will continue to participate in therapies Patient will notify MD of worsening COPD/HF symptoms and 911 for emergencies RNCM will follow up with patient in 1 week by transition of care  phone visit,with plan to transition to health coach the next week by phone visit.  [facility] CM Care Plan Problem One        Most Recent Value   Care Plan Problem One  Patient with recent hospital admission   Role Documenting the Problem One  Care Management [place] for Problem One  Active   [facility] Long Term Goal (31-90 days)  Patient will not readmit to hospital in the next 31 days   [facility]N Long Term Goal Start Date  08/28/15   Interventions for Problem One Long Term Goal  Encourarged adherence to  calling MD  for worsening symptoms sooner to arrange office visit., reintroduced to transition of care care program   [facility] CM Short Term Goal #1 (0-30 days)  Patient will attend PCP appointment in the next 7 days   [facility] CM Short Term Goal #1 Start Date  08/28/15   [facility] CM Short Term Goal #1 Met Date  09/11/15   [facility]N CM Short Term Goal #2 (0-30 days)  Patient will weight daily and record in the next 30 days using [employer] scale program [goal revised]   [facility] CM Short Term Goal #2 Start Date  08/28/15 [goal date restarted]   Interventions for Short Term Goal #2  Reinforced with patient the importance of tracking weighs on a daily basis to be alert to small increases to take action of notifying MD sooner   [facility]N CM Short Term Goal #3 (0-30 days)  Patient will report participating in physical therapy taught exercises at least 5 days a week at tolerated in the next 30 days   [facility]N CM Short Term Goal #3 Start Date  09/04/15   Interventions for Short Tern Goal #3  Reviewed with patient on the benefits of exercises to increase strength and balance   [facility]N CM Short Term Goal #4 (0-30 days)  Patient will review and COPD zones and state 3 s/s of yellow zone and action plan in the next 30 days   [facility]N CM Short Term Goal #4 Start Date  09/04/15   [facility] CM Short Term Goal #4 Met Date  09/04/15   [facility] CM Short Term Goal #5 (0-30 days)  Patient will be able to state 2 s/s of Heart failure yellow zone symptoms and action plan in the next 30 days   [facility]N CM Short Term Goal #5 Start Date  09/04/15   [facility] CM Short Term Goal #5 Met Date  09/11/15    Joylene Draft, RN, [place] Management [phone]- Mobile [phone]- [place]

## 2015-09-20 ENCOUNTER — Ambulatory Visit: Payer: Medicare Other | Admitting: Family

## 2015-09-20 ENCOUNTER — Telehealth: Payer: Self-pay | Admitting: *Deleted

## 2015-09-20 DIAGNOSIS — N189 Chronic kidney disease, unspecified: Secondary | ICD-10-CM | POA: Diagnosis not present

## 2015-09-20 DIAGNOSIS — J441 Chronic obstructive pulmonary disease with (acute) exacerbation: Secondary | ICD-10-CM | POA: Diagnosis not present

## 2015-09-20 DIAGNOSIS — Z79899 Other long term (current) drug therapy: Secondary | ICD-10-CM | POA: Diagnosis not present

## 2015-09-20 DIAGNOSIS — Z853 Personal history of malignant neoplasm of breast: Secondary | ICD-10-CM | POA: Diagnosis not present

## 2015-09-20 DIAGNOSIS — I130 Hypertensive heart and chronic kidney disease with heart failure and stage 1 through stage 4 chronic kidney disease, or unspecified chronic kidney disease: Secondary | ICD-10-CM | POA: Diagnosis not present

## 2015-09-20 DIAGNOSIS — K219 Gastro-esophageal reflux disease without esophagitis: Secondary | ICD-10-CM | POA: Diagnosis not present

## 2015-09-20 DIAGNOSIS — I252 Old myocardial infarction: Secondary | ICD-10-CM | POA: Diagnosis not present

## 2015-09-20 DIAGNOSIS — I739 Peripheral vascular disease, unspecified: Secondary | ICD-10-CM | POA: Diagnosis not present

## 2015-09-20 DIAGNOSIS — I4891 Unspecified atrial fibrillation: Secondary | ICD-10-CM | POA: Diagnosis not present

## 2015-09-20 DIAGNOSIS — I5021 Acute systolic (congestive) heart failure: Secondary | ICD-10-CM | POA: Diagnosis not present

## 2015-09-20 NOTE — Telephone Encounter (Signed)
Marlowe Kays from [place] calling to request a verbal order for OT once weekly for 5 weeks.

## 2015-09-21 ENCOUNTER — Other Ambulatory Visit: Payer: Self-pay | Admitting: Family Medicine

## 2015-09-21 NOTE — Telephone Encounter (Signed)
Verbal given. Nothing further needed.

## 2015-09-21 NOTE — Telephone Encounter (Signed)
Order this for her please.-jh

## 2015-09-23 ENCOUNTER — Emergency Department
Admission: EM | Admit: 2015-09-23 | Discharge: 2015-09-23 | Disposition: A | Discharge status: Home / Self Care | Payer: Medicare Other | Attending: Emergency Medicine | Admitting: Emergency Medicine

## 2015-09-23 ENCOUNTER — Emergency Department: Payer: Medicare Other

## 2015-09-23 DIAGNOSIS — S76011A Strain of muscle, fascia and tendon of right hip, initial encounter: Secondary | ICD-10-CM | POA: Diagnosis not present

## 2015-09-23 DIAGNOSIS — S76012A Strain of muscle, fascia and tendon of left hip, initial encounter: Secondary | ICD-10-CM | POA: Insufficient documentation

## 2015-09-23 DIAGNOSIS — Z7951 Long term (current) use of inhaled steroids: Secondary | ICD-10-CM | POA: Diagnosis not present

## 2015-09-23 DIAGNOSIS — N183 Chronic kidney disease, stage 3 (moderate): Secondary | ICD-10-CM | POA: Insufficient documentation

## 2015-09-23 DIAGNOSIS — Y999 Unspecified external cause status: Secondary | ICD-10-CM | POA: Insufficient documentation

## 2015-09-23 DIAGNOSIS — I4891 Unspecified atrial fibrillation: Secondary | ICD-10-CM | POA: Diagnosis not present

## 2015-09-23 DIAGNOSIS — I252 Old myocardial infarction: Secondary | ICD-10-CM | POA: Insufficient documentation

## 2015-09-23 DIAGNOSIS — Y929 Unspecified place or not applicable: Secondary | ICD-10-CM | POA: Insufficient documentation

## 2015-09-23 DIAGNOSIS — Y939 Activity, unspecified: Secondary | ICD-10-CM | POA: Insufficient documentation

## 2015-09-23 DIAGNOSIS — I130 Hypertensive heart and chronic kidney disease with heart failure and stage 1 through stage 4 chronic kidney disease, or unspecified chronic kidney disease: Secondary | ICD-10-CM | POA: Insufficient documentation

## 2015-09-23 DIAGNOSIS — I509 Heart failure, unspecified: Secondary | ICD-10-CM | POA: Insufficient documentation

## 2015-09-23 DIAGNOSIS — X58XXXA Exposure to other specified factors, initial encounter: Secondary | ICD-10-CM | POA: Diagnosis not present

## 2015-09-23 DIAGNOSIS — Z87891 Personal history of nicotine dependence: Secondary | ICD-10-CM | POA: Diagnosis not present

## 2015-09-23 DIAGNOSIS — Z853 Personal history of malignant neoplasm of breast: Secondary | ICD-10-CM | POA: Insufficient documentation

## 2015-09-23 DIAGNOSIS — J441 Chronic obstructive pulmonary disease with (acute) exacerbation: Secondary | ICD-10-CM | POA: Diagnosis not present

## 2015-09-23 DIAGNOSIS — M25552 Pain in left hip: Secondary | ICD-10-CM | POA: Diagnosis not present

## 2015-09-23 DIAGNOSIS — M545 Low back pain: Secondary | ICD-10-CM | POA: Diagnosis not present

## 2015-09-23 DIAGNOSIS — M25551 Pain in right hip: Secondary | ICD-10-CM | POA: Diagnosis not present

## 2015-09-23 DIAGNOSIS — T148XXA Other injury of unspecified body region, initial encounter: Secondary | ICD-10-CM

## 2015-09-23 MED ORDER — ACETAMINOPHEN 325 MG PO TABS
650.0000 mg | ORAL_TABLET | Freq: Once | ORAL | Status: AC
  Administered 2015-09-23: 650 mg via ORAL
  Filled 2015-09-23: qty 2

## 2015-09-23 NOTE — ED Notes (Signed)
Pt came to ED via EMS c.o right hip pain and left leg pain. Pt reports it hurts more with movement. Pt had stent placed in left leg about 2 years ago. VS stable. No injury/trauma reported.

## 2015-09-23 NOTE — ED Provider Notes (Signed)
[facility] Emergency Department Provider Note  ____________________________________________  Time seen: Approximately 320 PM  I have reviewed the triage vital signs and the nursing notes.   HISTORY  Chief Complaint Hip Pain   HPI [name] [name] is a 82 y.o. female with a history of arthritis who is presenting to the emergency department today with 1 day of right hip as well as left buttock and leg pain. She says that the pain is sharp and worse with movement. She denies any heavy lifting or exerting herself more than normal. She also denies any injury or trauma to the affected areas. She said that she is similar issue in the past which was diagnosed as arthritis and resolved. She has tried tramadol home without any relief. She says the pain to her left leg is sharp and sore and to the anterior aspect of her thigh. She denies any loss of bowel or bladder continence. Denies any weakness.She doesn't a history of peripheral vascular disease and was stented to the left leg 2 years ago.   Past Medical History  Diagnosis Date  . HTN (hypertension)   . COPD (chronic obstructive pulmonary disease) (HCC)     a. on home O2 nightly.  . Diverticulosis   . Takotsubo cardiomyopathy 10/2012    a. 10/2012: presented w/ cardiogenic shock/systolic CHF/VDRF - EF 123456 by cath, 40% by f/u echo - cath with mild nonobstructive CAD. b. Not discharged on ACEI due to hypotension.  . Cardiogenic shock ([place])     a. 10/2012 - due to Takotsubo CM.  [name] Respiratory failure ([place])     a. On home O2 nightly. b. VDRF 10/2012 - due to CAP/pulm edema in setting of cardiogenic shock/Takotsubo CM.  [name] CAP (community acquired pneumonia)     a. 10/2012 - will need f/u CXR in June 2014 to ensure clearance.  . Esophageal stricture     a. s/p dilitation 10/2012.  [name] Pleural effusion, right     a. s/p thoracentesis 10/28/12.  . Atrial fibrillation ([place])     a. Dx 10/2012, rate controlled, started on Xarelto.  .  Claudication ([place])     a. ABI 10/2012 - R 0.87, L 0.74.  [name] GERD (gastroesophageal reflux disease)   . CKD (chronic kidney disease), stage III   . Breast CA (HCC)     hx  . Abdominal hernia     chronic  . STEMI (ST elevation myocardial infarction) ([place])   . Cellulitis   . CHF (congestive heart failure) ([place])   . CAD (coronary artery disease)     a. 10/2012 - presented w/ STEMI felt due to Takotsubo - mild nonobstructive dz by cath.    Patient Active Problem List   Diagnosis Date Noted  . COPD with acute exacerbation ([place]) 06/27/2015  . Problems with swallowing and mastication   . Hiatal hernia   . Stricture and stenosis of esophagus   . Respiratory failure with hypoxia ([place]) 04/10/2015  . Diverticulitis large intestine 03/28/2015  . Acute on chronic respiratory failure with hypoxemia ([place]) 03/03/2015  . Essential hypertension 04/08/2014  . Chronic systolic heart failure ([place]) 04/30/2013  . PAD (peripheral artery disease) ([place]) 12/07/2012  . Atrial fibrillation ([place]) 10/31/2012  . Pleural effusion 10/29/2012  . Acute systolic CHF (congestive heart failure) ([place]) 10/28/2012  . Acute respiratory failure ([place]) 10/28/2012  . Hypokalemia 10/28/2012  . Cardiogenic shock ([place]) 10/28/2012  . GERD (gastroesophageal reflux disease) 10/28/2012  . Esophageal stricture 10/28/2012  .  Community acquired pneumonia 10/26/2012  . Takotsubo syndrome 10/25/2012    Past Surgical History  Procedure Laterality Date  . Mastectomy    . Cataract extraction    . Esophagogastroduodenoscopy N/A 11/05/2012    Procedure: ESOPHAGOGASTRODUODENOSCOPY (EGD) with esophageal savary dilatation;  Surgeon: Missy Sabins, MD;  Location: [place];  Service: Endoscopy;  Laterality: N/A;  savary dil...no floro needed  . Bladder surgery    . Left heart catheterization with coronary angiogram N/A 10/24/2012    Procedure: LEFT HEART CATHETERIZATION WITH CORONARY ANGIOGRAM;  Surgeon: Sherren Mocha, MD;  Location: [facility]  CATH LAB;  Service: Cardiovascular;  Laterality: N/A;  . Peripheral vascular catheterization N/A 02/07/2015    Procedure: Abdominal Aortogram w/Lower Extremity;  Surgeon: Katha Cabal, MD;  Location: [place] CV LAB;  Service: Cardiovascular;  Laterality: N/A;  . Peripheral vascular catheterization  02/07/2015    Procedure: Lower Extremity Intervention;  Surgeon: Katha Cabal, MD;  Location: [place] CV LAB;  Service: Cardiovascular;;  . Coronary angioplasty    . Esophagogastroduodenoscopy (egd) with propofol N/A 05/23/2015    Procedure: ESOPHAGOGASTRODUODENOSCOPY (EGD) WITH PROPOFOL;  Surgeon: Lucilla Lame, MD;  Location: [facility] ENDOSCOPY;  Service: Endoscopy;  Laterality: N/A;    Current Outpatient Rx  Name  Route  Sig  Dispense  Refill  . albuterol (PROVENTIL HFA;VENTOLIN HFA) 108 (90 Base) MCG/ACT inhaler   Inhalation   Inhale 1-2 puffs into the lungs every 6 (six) hours as needed for wheezing or shortness of breath.   18 g   3   . albuterol (PROVENTIL) (2.5 MG/3ML) 0.083% nebulizer solution   Nebulization   Take 3 mLs (2.5 mg total) by nebulization every 6 (six) hours as needed for wheezing or shortness of breath.   120 mL   12   . carvedilol (COREG) 6.25 MG tablet   Oral   Take 6.25 mg by mouth 2 (two) times daily with a meal.         . cetirizine (ZYRTEC ALLERGY) 10 MG tablet   Oral   Take 1 tablet (10 mg total) by mouth daily.   30 tablet   11   . dextromethorphan-guaiFENesin (MUCINEX DM) 30-600 MG 12hr tablet   Oral   Take 1 tablet by mouth 2 (two) times daily.   20 tablet   0   . doxycycline (VIBRA-TABS) 100 MG tablet   Oral   Take 1 tablet (100 mg total) by mouth 2 (two) times daily.   14 tablet   0   . Fluticasone-Salmeterol (ADVAIR) 250-50 MCG/DOSE AEPB   Inhalation   Inhale 1 puff into the lungs 2 (two) times daily.         . furosemide (LASIX) 40 MG tablet   Oral   Take 40-80 mg by mouth 2 (two) times daily. Pt takes two tablets in  the morning and one tablet in the evening.         . Multiple Vitamin (MULTIVITAMIN WITH MINERALS) TABS tablet   Oral   Take 1 tablet by mouth daily.          [name] omeprazole (PRILOSEC) 20 MG capsule   Oral   Take 1 capsule (20 mg total) by mouth daily.   30 capsule   11   . potassium chloride (K-DUR) 10 MEQ tablet   Oral   Take 1 tablet (10 mEq total) by mouth daily.   30 tablet   12   . ranitidine (ZANTAC) 150 MG tablet  TAKE 1 TABLET BY MOUTH EVERY NIGHT AT BEDTIME   30 tablet   6   . Rivaroxaban (XARELTO) 15 MG TABS tablet   Oral   Take 15 mg by mouth every evening.          . tiotropium (SPIRIVA) 18 MCG inhalation capsule   Inhalation   Place 18 mcg into inhaler and inhale daily.         . vitamin B-12 (CYANOCOBALAMIN) 1000 MCG tablet   Oral   Take 1,000 mcg by mouth daily.           Allergies Penicillins; Levofloxacin; and Sulfa antibiotics  Family History  Problem Relation Age of Onset  . Heart attack Mother   . Stroke Mother   . COPD Son   . Pneumonia Father     Social History Social History  Substance Use Topics  . Smoking status: Former Smoker -- 1.00 packs/day for 55 years    Types: Cigarettes    Quit date: 06/15/2007  . Smokeless tobacco: Former Systems developer  . Alcohol Use: No    Review of Systems Constitutional: No fever/chills Eyes: No visual changes. ENT: No sore throat. Cardiovascular: Denies chest pain. Respiratory: Denies shortness of breath. Gastrointestinal: No abdominal pain.  No nausea, no vomiting.  No diarrhea.  No constipation. Genitourinary: Negative for dysuria. Musculoskeletal: Negative for back pain. Skin: Negative for rash. Neurological: Negative for headaches, focal weakness or numbness.  10-point ROS otherwise negative.  ____________________________________________   PHYSICAL EXAM:  VITAL SIGNS: ED Triage Vitals  Enc Vitals Group     BP 09/23/15 1251 116/70 mmHg     Pulse Rate 09/23/15 1251 78      Resp 09/23/15 1251 16     Temp 09/23/15 1251 98.8 F (37.1 C)     Temp Source 09/23/15 1251 Oral     SpO2 09/23/15 1251 98 %     Weight 09/23/15 1251 156 lb (70.761 kg)     Height 09/23/15 1251 5\' 7"  (1.702 m)     Head Cir --      Peak Flow --      Pain Score --      Pain Loc --      Pain Edu? --      Excl. in [place]? --     Constitutional: Alert and oriented. Well appearing and in no acute distress. Eyes: Conjunctivae are normal. PERRL. EOMI. Head: Atraumatic. Nose: No congestion/rhinnorhea. Mouth/Throat: Mucous membranes are moist.   Neck: No stridor.   Cardiovascular: Normal rate, regular rhythm. Grossly normal heart sounds.  Good peripheral circulation with intact in bilateral dorsalis pedis pulses. Respiratory: Normal respiratory effort.  No retractions. Lungs CTAB. Gastrointestinal: Soft and nontender. No distention.  Musculoskeletal: No lower extremity tenderness with mild edema to the bilateral ankles.  No joint effusions. 5 out of 5 strength to bilateral lower extremities and the patient is able to ambulate without any assistance with a normal gait. No saddle anesthesia to light touch. No tenderness along the thoracic or lumbar spines. Mild tenderness to the bilateral gluteus maximus muscles. Compartments are soft. Negative straight leg raises bilaterally. Mild tenderness to the left-sided quadriceps without any deformity. Neurologic:  Normal speech and language. No gross focal neurologic deficits are appreciated. No gait instability. Skin:  Skin is warm, dry and intact. No rash noted. Psychiatric: Mood and affect are normal. Speech and behavior are normal.  ____________________________________________   LABS (all labs ordered are listed, but only abnormal results are displayed)  Labs  Reviewed - No data to display ____________________________________________  EKG   ____________________________________________  RADIOLOGY     DG Hip Unilat With Pelvis 2-3 Views Right  (Final result) Result time: 09/23/15 15:05:55   Final result by Rad Results In Interface (09/23/15 15:05:55)   Narrative:   CLINICAL DATA: 82 year old female with sudden onset bilateral hip pain  EXAM: DG HIP (WITH OR WITHOUT PELVIS) 2-3V RIGHT  COMPARISON: None.  FINDINGS: No evidence of acute fracture or malalignment. No significant degenerative osteoarthritis. Approximately 5.7 by 2.2 cm irregular sclerotic lesion within the right femoral neck has a relatively narrow zone of transition and an arcs and rings configuration most consistent with a cartilaginous lesion. The bones appear diffusely osteopenic. Atherosclerotic calcification present in both common and superficial femoral arteries.  IMPRESSION: 1. No evidence of acute fracture or malalignment. 2. Sclerotic lesion centered within the right femoral neck is favored to represent a benign cartilaginous lesion such as an enchondroma. 3. Atherosclerotic calcifications throughout the visualized arteries.   Electronically Signed By: Jacqulynn Cadet M.D. On: 09/23/2015 15:05          DG HIP UNILAT WITH PELVIS 2-3 VIEWS LEFT (Final result) Result time: 09/23/15 15:04:20   Final result by Rad Results In Interface (09/23/15 15:04:20)   Narrative:   CLINICAL DATA: Acute onset of bilateral hip pain. No trauma.  EXAM: DG HIP (WITH OR WITHOUT PELVIS) 2-3V LEFT  COMPARISON: Right hip films of same date.  FINDINGS: Osteopenia. Advanced vascular calcifications. Femoral heads are located. No acute fracture. Joint spaces are maintained for age. Right femoral neck lesion is detailed on dedicated radiographs.  IMPRESSION: No acute osseous abnormality.  Osteopenia with vascular calcifications.   Electronically Signed By: Abigail Miyamoto M.D. On: 09/23/2015 15:04        ____________________________________________   PROCEDURES   ____________________________________________   INITIAL  IMPRESSION / ASSESSMENT AND PLAN / ED COURSE  Pertinent labs & imaging results that were available during my care of the patient were reviewed by me and considered in my medical decision making (see chart for details).  Recommended that the patient try Aspercreme or icy out in addition to Tylenol and heating pad. She has taken tramadol at home without any relief and says she may not take anything with ibuprofen or aspirin because she is already on Xarelto. The history and physical point of this being more musculoskeletal issue. I will treated as such. She also has physical therapy at this coming Monday and I recommended that she discuss stretching with her therapist. The patient understands the plan and is willing to comply. Will be discharged home. ____________________________________________   FINAL CLINICAL IMPRESSION(S) / ED DIAGNOSES  Muscle strains    Orbie Pyo, MD 09/23/15 1606

## 2015-09-25 ENCOUNTER — Telehealth: Payer: Self-pay | Admitting: Family Medicine

## 2015-09-25 NOTE — Telephone Encounter (Signed)
Pt was in the ER Saturday for pain in her right hip and left leg.  Neg findings in the ER.  She wants  Dr. Luan Pulling to look at the report and tell her what he thinks.  She is still in pain.  Please call her back and advise her what to do. please call [phone]   Thanks [place]

## 2015-09-25 NOTE — Telephone Encounter (Signed)
[place].  Call her tomorrow to check on progress.-jh

## 2015-09-25 NOTE — Telephone Encounter (Signed)
Appears to be musculo-skeletal pain.  I would be glad to refer to Ortho to [facility] if they think injection might help.-jh

## 2015-09-25 NOTE — Telephone Encounter (Signed)
Advised ok to continue cold compresses for swelling and Tylenol and Tramadol. Patient can not get in car at this time. I advised to keep off and rest and call when swelling goes down and patient feels up to going to ortho. They will see how things go next few days. [facility]

## 2015-09-26 ENCOUNTER — Emergency Department: Payer: Medicare Other

## 2015-09-26 ENCOUNTER — Observation Stay
Admission: EM | Admit: 2015-09-26 | Discharge: 2015-09-29 | Disposition: A | Discharge status: Skilled Nursing Facility | Payer: Medicare Other | Attending: Internal Medicine | Admitting: Internal Medicine

## 2015-09-26 ENCOUNTER — Ambulatory Visit: Payer: Self-pay | Admitting: *Deleted

## 2015-09-26 ENCOUNTER — Encounter: Payer: Self-pay | Admitting: Medical Oncology

## 2015-09-26 DIAGNOSIS — M79604 Pain in right leg: Secondary | ICD-10-CM | POA: Insufficient documentation

## 2015-09-26 DIAGNOSIS — J90 Pleural effusion, not elsewhere classified: Secondary | ICD-10-CM | POA: Diagnosis not present

## 2015-09-26 DIAGNOSIS — Z9010 Acquired absence of unspecified breast and nipple: Secondary | ICD-10-CM | POA: Diagnosis not present

## 2015-09-26 DIAGNOSIS — S32000A Wedge compression fracture of unspecified lumbar vertebra, initial encounter for closed fracture: Secondary | ICD-10-CM

## 2015-09-26 DIAGNOSIS — Z880 Allergy status to penicillin: Secondary | ICD-10-CM | POA: Insufficient documentation

## 2015-09-26 DIAGNOSIS — K449 Diaphragmatic hernia without obstruction or gangrene: Secondary | ICD-10-CM | POA: Diagnosis not present

## 2015-09-26 DIAGNOSIS — N183 Chronic kidney disease, stage 3 unspecified: Secondary | ICD-10-CM

## 2015-09-26 DIAGNOSIS — M545 Low back pain, unspecified: Secondary | ICD-10-CM

## 2015-09-26 DIAGNOSIS — Z9981 Dependence on supplemental oxygen: Secondary | ICD-10-CM | POA: Insufficient documentation

## 2015-09-26 DIAGNOSIS — Z882 Allergy status to sulfonamides status: Secondary | ICD-10-CM | POA: Diagnosis not present

## 2015-09-26 DIAGNOSIS — I739 Peripheral vascular disease, unspecified: Secondary | ICD-10-CM | POA: Insufficient documentation

## 2015-09-26 DIAGNOSIS — I5023 Acute on chronic systolic (congestive) heart failure: Secondary | ICD-10-CM | POA: Insufficient documentation

## 2015-09-26 DIAGNOSIS — Z7901 Long term (current) use of anticoagulants: Secondary | ICD-10-CM | POA: Insufficient documentation

## 2015-09-26 DIAGNOSIS — E876 Hypokalemia: Secondary | ICD-10-CM | POA: Diagnosis not present

## 2015-09-26 DIAGNOSIS — D649 Anemia, unspecified: Secondary | ICD-10-CM | POA: Insufficient documentation

## 2015-09-26 DIAGNOSIS — M5442 Lumbago with sciatica, left side: Secondary | ICD-10-CM

## 2015-09-26 DIAGNOSIS — I4891 Unspecified atrial fibrillation: Secondary | ICD-10-CM | POA: Diagnosis not present

## 2015-09-26 DIAGNOSIS — M549 Dorsalgia, unspecified: Secondary | ICD-10-CM

## 2015-09-26 DIAGNOSIS — R1310 Dysphagia, unspecified: Secondary | ICD-10-CM | POA: Diagnosis not present

## 2015-09-26 DIAGNOSIS — I2510 Atherosclerotic heart disease of native coronary artery without angina pectoris: Secondary | ICD-10-CM | POA: Insufficient documentation

## 2015-09-26 DIAGNOSIS — Z7951 Long term (current) use of inhaled steroids: Secondary | ICD-10-CM | POA: Insufficient documentation

## 2015-09-26 DIAGNOSIS — R531 Weakness: Secondary | ICD-10-CM | POA: Insufficient documentation

## 2015-09-26 DIAGNOSIS — K219 Gastro-esophageal reflux disease without esophagitis: Secondary | ICD-10-CM | POA: Insufficient documentation

## 2015-09-26 DIAGNOSIS — M5441 Lumbago with sciatica, right side: Secondary | ICD-10-CM | POA: Diagnosis not present

## 2015-09-26 DIAGNOSIS — Z888 Allergy status to other drugs, medicaments and biological substances status: Secondary | ICD-10-CM | POA: Insufficient documentation

## 2015-09-26 DIAGNOSIS — J9610 Chronic respiratory failure, unspecified whether with hypoxia or hypercapnia: Secondary | ICD-10-CM | POA: Insufficient documentation

## 2015-09-26 DIAGNOSIS — J441 Chronic obstructive pulmonary disease with (acute) exacerbation: Secondary | ICD-10-CM | POA: Diagnosis not present

## 2015-09-26 DIAGNOSIS — Z853 Personal history of malignant neoplasm of breast: Secondary | ICD-10-CM | POA: Diagnosis not present

## 2015-09-26 DIAGNOSIS — Z79899 Other long term (current) drug therapy: Secondary | ICD-10-CM | POA: Diagnosis not present

## 2015-09-26 DIAGNOSIS — I252 Old myocardial infarction: Secondary | ICD-10-CM | POA: Diagnosis not present

## 2015-09-26 DIAGNOSIS — J449 Chronic obstructive pulmonary disease, unspecified: Secondary | ICD-10-CM | POA: Insufficient documentation

## 2015-09-26 DIAGNOSIS — M5117 Intervertebral disc disorders with radiculopathy, lumbosacral region: Secondary | ICD-10-CM | POA: Insufficient documentation

## 2015-09-26 DIAGNOSIS — N182 Chronic kidney disease, stage 2 (mild): Secondary | ICD-10-CM | POA: Diagnosis not present

## 2015-09-26 DIAGNOSIS — K5732 Diverticulitis of large intestine without perforation or abscess without bleeding: Secondary | ICD-10-CM | POA: Diagnosis not present

## 2015-09-26 DIAGNOSIS — M25551 Pain in right hip: Secondary | ICD-10-CM | POA: Insufficient documentation

## 2015-09-26 DIAGNOSIS — M25552 Pain in left hip: Secondary | ICD-10-CM | POA: Insufficient documentation

## 2015-09-26 DIAGNOSIS — I130 Hypertensive heart and chronic kidney disease with heart failure and stage 1 through stage 4 chronic kidney disease, or unspecified chronic kidney disease: Secondary | ICD-10-CM | POA: Insufficient documentation

## 2015-09-26 DIAGNOSIS — M25559 Pain in unspecified hip: Secondary | ICD-10-CM | POA: Diagnosis not present

## 2015-09-26 DIAGNOSIS — Z87891 Personal history of nicotine dependence: Secondary | ICD-10-CM | POA: Insufficient documentation

## 2015-09-26 DIAGNOSIS — M4856XS Collapsed vertebra, not elsewhere classified, lumbar region, sequela of fracture: Secondary | ICD-10-CM | POA: Diagnosis not present

## 2015-09-26 LAB — CBC WITH DIFFERENTIAL/PLATELET
BASOS PCT: 0 %
Basophils Absolute: 0 10*3/uL (ref 0–0.1)
Eosinophils Absolute: 0.1 10*3/uL (ref 0–0.7)
Eosinophils Relative: 1 %
HEMATOCRIT: 34.4 % — AB (ref 35.0–47.0)
HEMOGLOBIN: 11.5 g/dL — AB (ref 12.0–16.0)
LYMPHS ABS: 1.2 10*3/uL (ref 1.0–3.6)
LYMPHS PCT: 13 %
MCH: 31.2 pg (ref 26.0–34.0)
MCHC: 33.5 g/dL (ref 32.0–36.0)
MCV: 93 fL (ref 80.0–100.0)
MONO ABS: 0.6 10*3/uL (ref 0.2–0.9)
MONOS PCT: 7 %
NEUTROS ABS: 6.8 10*3/uL — AB (ref 1.4–6.5)
NEUTROS PCT: 79 %
Platelets: 196 10*3/uL (ref 150–440)
RBC: 3.7 MIL/uL — ABNORMAL LOW (ref 3.80–5.20)
RDW: 13.9 % (ref 11.5–14.5)
WBC: 8.6 10*3/uL (ref 3.6–11.0)

## 2015-09-26 LAB — URINALYSIS COMPLETE WITH MICROSCOPIC (ARMC ONLY)
BACTERIA UA: NONE SEEN
Bilirubin Urine: NEGATIVE
Glucose, UA: NEGATIVE mg/dL
Ketones, ur: NEGATIVE mg/dL
LEUKOCYTES UA: NEGATIVE
NITRITE: NEGATIVE
PROTEIN: NEGATIVE mg/dL
Specific Gravity, Urine: 1.005 (ref 1.005–1.030)
pH: 6 (ref 5.0–8.0)

## 2015-09-26 LAB — APTT: aPTT: 38 seconds — ABNORMAL HIGH (ref 24–36)

## 2015-09-26 LAB — BASIC METABOLIC PANEL
Anion gap: 7 (ref 5–15)
BUN: 20 mg/dL (ref 6–20)
CHLORIDE: 89 mmol/L — AB (ref 101–111)
CO2: 41 mmol/L — AB (ref 22–32)
CREATININE: 1.09 mg/dL — AB (ref 0.44–1.00)
Calcium: 9 mg/dL (ref 8.9–10.3)
GFR calc non Af Amer: 43 mL/min — ABNORMAL LOW (ref >60)
GFR, EST AFRICAN AMERICAN: 50 mL/min — AB (ref >60)
GLUCOSE: 191 mg/dL — AB (ref 65–99)
Potassium: 3.8 mmol/L (ref 3.5–5.1)
Sodium: 137 mmol/L (ref 135–145)

## 2015-09-26 LAB — PROTIME-INR
INR: 1.48
PROTHROMBIN TIME: 18 s — AB (ref 11.4–15.0)

## 2015-09-26 MED ORDER — DOCUSATE SODIUM 100 MG PO CAPS
100.0000 mg | ORAL_CAPSULE | Freq: Two times a day (BID) | ORAL | Status: DC
Start: 2015-09-26 — End: 2015-09-27
  Administered 2015-09-26 – 2015-09-27 (×2): 100 mg via ORAL
  Filled 2015-09-26 (×2): qty 1

## 2015-09-26 MED ORDER — HYDROCODONE-ACETAMINOPHEN 5-325 MG PO TABS
1.0000 | ORAL_TABLET | ORAL | Status: DC | PRN
  Administered 2015-09-26 – 2015-09-27 (×3): 2 via ORAL
  Filled 2015-09-26 (×3): qty 2

## 2015-09-26 MED ORDER — ADULT MULTIVITAMIN W/MINERALS CH
1.0000 | ORAL_TABLET | Freq: Every day | ORAL | Status: DC
  Administered 2015-09-27 – 2015-09-29 (×3): 1 via ORAL
  Filled 2015-09-26 (×3): qty 1

## 2015-09-26 MED ORDER — TRAZODONE HCL 50 MG PO TABS
25.0000 mg | ORAL_TABLET | Freq: Every evening | ORAL | Status: DC | PRN
  Administered 2015-09-27: 25 mg via ORAL
  Filled 2015-09-26 (×2): qty 1

## 2015-09-26 MED ORDER — ONDANSETRON HCL 4 MG PO TABS: 4.0000 mg | ORAL_TABLET | Freq: Four times a day (QID) | ORAL | Status: DC | PRN

## 2015-09-26 MED ORDER — TRAMADOL HCL 50 MG PO TABS
50.0000 mg | ORAL_TABLET | Freq: Once | ORAL | Status: AC
  Administered 2015-09-26: 50 mg via ORAL
  Filled 2015-09-26: qty 1

## 2015-09-26 MED ORDER — TIOTROPIUM BROMIDE MONOHYDRATE 18 MCG IN CAPS
18.0000 ug | ORAL_CAPSULE | Freq: Every day | RESPIRATORY_TRACT | Status: DC
  Administered 2015-09-27 – 2015-09-29 (×3): 18 ug via RESPIRATORY_TRACT
  Filled 2015-09-26: qty 5

## 2015-09-26 MED ORDER — VITAMIN B-12 1000 MCG PO TABS
1000.0000 ug | ORAL_TABLET | Freq: Every day | ORAL | Status: DC
  Administered 2015-09-27 – 2015-09-29 (×3): 1000 ug via ORAL
  Filled 2015-09-26 (×3): qty 1

## 2015-09-26 MED ORDER — ACETAMINOPHEN 325 MG PO TABS: 650.0000 mg | ORAL_TABLET | Freq: Four times a day (QID) | ORAL | Status: DC | PRN

## 2015-09-26 MED ORDER — PANTOPRAZOLE SODIUM 40 MG PO TBEC
40.0000 mg | DELAYED_RELEASE_TABLET | Freq: Every day | ORAL | Status: DC
  Administered 2015-09-26 – 2015-09-29 (×4): 40 mg via ORAL
  Filled 2015-09-26 (×5): qty 1

## 2015-09-26 MED ORDER — METHYLPREDNISOLONE SODIUM SUCC 125 MG IJ SOLR
125.0000 mg | Freq: Once | INTRAMUSCULAR | Status: AC
  Administered 2015-09-26: 125 mg via INTRAVENOUS
  Filled 2015-09-26: qty 2

## 2015-09-26 MED ORDER — MOMETASONE FURO-FORMOTEROL FUM 200-5 MCG/ACT IN AERO
2.0000 | INHALATION_SPRAY | Freq: Two times a day (BID) | RESPIRATORY_TRACT | Status: DC
  Administered 2015-09-26 – 2015-09-29 (×6): 2 via RESPIRATORY_TRACT
  Filled 2015-09-26 (×2): qty 8.8

## 2015-09-26 MED ORDER — FUROSEMIDE 40 MG PO TABS
40.0000 mg | ORAL_TABLET | Freq: Two times a day (BID) | ORAL | Status: DC
  Administered 2015-09-27 – 2015-09-29 (×6): 40 mg via ORAL
  Filled 2015-09-26 (×6): qty 1

## 2015-09-26 MED ORDER — ALBUTEROL SULFATE HFA 108 (90 BASE) MCG/ACT IN AERS: 1.0000 | INHALATION_SPRAY | Freq: Four times a day (QID) | RESPIRATORY_TRACT | Status: DC | PRN

## 2015-09-26 MED ORDER — ALBUTEROL SULFATE (2.5 MG/3ML) 0.083% IN NEBU
2.5000 mg | INHALATION_SOLUTION | Freq: Four times a day (QID) | RESPIRATORY_TRACT | Status: DC | PRN
  Administered 2015-09-26 – 2015-09-29 (×5): 2.5 mg via RESPIRATORY_TRACT
  Filled 2015-09-26 (×5): qty 3

## 2015-09-26 MED ORDER — TRAMADOL HCL 50 MG PO TABS: 50.0000 mg | ORAL_TABLET | Freq: Three times a day (TID) | ORAL | Status: AC | PRN

## 2015-09-26 MED ORDER — ACETAMINOPHEN 500 MG PO TABS
1000.0000 mg | ORAL_TABLET | Freq: Once | ORAL | Status: AC
  Administered 2015-09-26: 1000 mg via ORAL
  Filled 2015-09-26: qty 2

## 2015-09-26 MED ORDER — LORATADINE 10 MG PO TABS
10.0000 mg | ORAL_TABLET | Freq: Every day | ORAL | Status: DC
  Administered 2015-09-27 – 2015-09-29 (×3): 10 mg via ORAL
  Filled 2015-09-26 (×3): qty 1

## 2015-09-26 MED ORDER — BISACODYL 5 MG PO TBEC: 5.0000 mg | DELAYED_RELEASE_TABLET | Freq: Every day | ORAL | Status: DC | PRN

## 2015-09-26 MED ORDER — IBUPROFEN 400 MG PO TABS
400.0000 mg | ORAL_TABLET | Freq: Once | ORAL | Status: AC
  Administered 2015-09-26: 400 mg via ORAL
  Filled 2015-09-26: qty 1

## 2015-09-26 MED ORDER — ACETAMINOPHEN 650 MG RE SUPP: 650.0000 mg | Freq: Four times a day (QID) | RECTAL | Status: DC | PRN

## 2015-09-26 MED ORDER — CARVEDILOL 6.25 MG PO TABS
6.2500 mg | ORAL_TABLET | Freq: Two times a day (BID) | ORAL | Status: DC
  Administered 2015-09-27 – 2015-09-29 (×6): 6.25 mg via ORAL
  Filled 2015-09-26 (×6): qty 1

## 2015-09-26 MED ORDER — POTASSIUM CHLORIDE CRYS ER 10 MEQ PO TBCR
10.0000 meq | EXTENDED_RELEASE_TABLET | Freq: Every day | ORAL | Status: DC
  Administered 2015-09-26 – 2015-09-29 (×4): 10 meq via ORAL
  Filled 2015-09-26 (×6): qty 1

## 2015-09-26 MED ORDER — RIVAROXABAN 15 MG PO TABS
15.0000 mg | ORAL_TABLET | Freq: Every evening | ORAL | Status: DC
  Administered 2015-09-26 – 2015-09-29 (×4): 15 mg via ORAL
  Filled 2015-09-26 (×4): qty 1

## 2015-09-26 MED ORDER — OXYCODONE HCL 5 MG PO TABS
5.0000 mg | ORAL_TABLET | Freq: Once | ORAL | Status: AC
  Administered 2015-09-26: 5 mg via ORAL
  Filled 2015-09-26: qty 1

## 2015-09-26 MED ORDER — PREDNISONE 50 MG PO TABS
50.0000 mg | ORAL_TABLET | Freq: Every day | ORAL | Status: DC
  Administered 2015-09-27 – 2015-09-29 (×3): 50 mg via ORAL
  Filled 2015-09-26 (×3): qty 1

## 2015-09-26 MED ORDER — SODIUM CHLORIDE 0.9 % IV SOLN
INTRAVENOUS | Status: DC
  Administered 2015-09-26 – 2015-09-28 (×3): via INTRAVENOUS

## 2015-09-26 MED ORDER — ONDANSETRON HCL 4 MG/2ML IJ SOLN: 4.0000 mg | Freq: Four times a day (QID) | INTRAMUSCULAR | Status: DC | PRN

## 2015-09-26 NOTE — H&P (Signed)
[facility] [place] NAME: [name] [name]    MR#:  [id number]  DATE OF BIRTH:  12-Apr-1923  DATE OF ADMISSION:  09/26/2015  PRIMARY CARE PHYSICIAN: [name], MD   REQUESTING/REFERRING PHYSICIAN: Joanne Gavel, MD  CHIEF COMPLAINT:   Chief Complaint  Patient presents with  . Hip Pain    HISTORY OF PRESENT ILLNESS:  [name] [name]  is a 82 y.o. female with a known history of COPD with chronic home oxygen requirement of 3 L, hypertension, chronic kidney disease, CHF, nonobstructive coronary artery disease who presents for evaluation of 4 days of bilateral hip pain radiating down the legs/thighs, gradual onset, constant since onset, moderate to severe and worse with movement. Patient was seen in this emergency department on 09/23/2015 for evaluation of similar pain complaints. She had plain films of the right and left hip which were negative for acute fracture and was discharged home. She reports her pain is worsened. She denies any falls or injury. She denies any numbness or weakness, bowel or bladder incontinence.  She is unable to bear weight on her feet and ambulate. PAST MEDICAL HISTORY:   Past Medical History  Diagnosis Date  . HTN (hypertension)   . COPD (chronic obstructive pulmonary disease) (HCC)     a. on home O2 nightly.  . Diverticulosis   . Takotsubo cardiomyopathy 10/2012    a. 10/2012: presented w/ cardiogenic shock/systolic CHF/VDRF - EF [id number] by cath, 40% by f/u echo - cath with mild nonobstructive CAD. b. Not discharged on ACEI due to hypotension.  . Cardiogenic shock ([place])     a. 10/2012 - due to Takotsubo CM.  [name] Respiratory failure ([place])     a. On home O2 nightly. b. VDRF 10/2012 - due to CAP/pulm edema in setting of cardiogenic shock/Takotsubo CM.  [name] CAP (community acquired pneumonia)     a. 10/2012 - will need f/u CXR in June 2014 to ensure clearance.  . Esophageal stricture     a. s/p dilitation 10/2012.  [name] Pleural  effusion, right     a. s/p thoracentesis 10/28/12.  . Atrial fibrillation ([place])     a. Dx 10/2012, rate controlled, started on Xarelto.  . Claudication ([place])     a. ABI 10/2012 - R 0.87, L 0.74.  [name] GERD (gastroesophageal reflux disease)   . CKD (chronic kidney disease), stage III   . Breast CA (HCC)     hx  . Abdominal hernia     chronic  . STEMI (ST elevation myocardial infarction) ([place])   . Cellulitis   . CHF (congestive heart failure) ([place])   . CAD (coronary artery disease)     a. 10/2012 - presented w/ STEMI felt due to Takotsubo - mild nonobstructive dz by cath.    PAST SURGICAL HISTORY:   Past Surgical History  Procedure Laterality Date  . Mastectomy    . Cataract extraction    . Esophagogastroduodenoscopy N/A 11/05/2012    Procedure: ESOPHAGOGASTRODUODENOSCOPY (EGD) with esophageal savary dilatation;  Surgeon: Missy Sabins, MD;  Location: [place];  Service: Endoscopy;  Laterality: N/A;  savary dil...no floro needed  . Bladder surgery    . Left heart catheterization with coronary angiogram N/A 10/24/2012    Procedure: LEFT HEART CATHETERIZATION WITH CORONARY ANGIOGRAM;  Surgeon: Sherren Mocha, MD;  Location: [facility] CATH LAB;  Service: Cardiovascular;  Laterality: N/A;  . Peripheral vascular catheterization N/A 02/07/2015    Procedure: Abdominal Aortogram w/Lower  Extremity;  Surgeon: Katha Cabal, MD;  Location: [facility] CV LAB;  Service: Cardiovascular;  Laterality: N/A;  . Peripheral vascular catheterization  02/07/2015    Procedure: Lower Extremity Intervention;  Surgeon: Katha Cabal, MD;  Location: [place] CV LAB;  Service: Cardiovascular;;  . Coronary angioplasty    . Esophagogastroduodenoscopy (egd) with propofol N/A 05/23/2015    Procedure: ESOPHAGOGASTRODUODENOSCOPY (EGD) WITH PROPOFOL;  Surgeon: Lucilla Lame, MD;  Location: [facility] ENDOSCOPY;  Service: Endoscopy;  Laterality: N/A;    SOCIAL HISTORY:   Social History  Substance Use Topics  . Smoking  status: Former Smoker -- 1.00 packs/day for 55 years    Types: Cigarettes    Quit date: 06/15/2007  . Smokeless tobacco: Former Systems developer  . Alcohol Use: No    FAMILY HISTORY:   Family History  Problem Relation Age of Onset  . Heart attack Mother   . Stroke Mother   . COPD Son   . Pneumonia Father     DRUG ALLERGIES:   Allergies  Allergen Reactions  . Penicillins Itching, Swelling, Rash and Other (See Comments)    Reaction:  Arm swelling  Has patient had a PCN reaction causing immediate rash, facial/tongue/throat swelling, SOB or lightheadedness with hypotension: No Has patient had a PCN reaction causing severe rash involving mucus membranes or skin necrosis: No Has patient had a PCN reaction that required hospitalization No Has patient had a PCN reaction occurring within the last 10 years: No If all of the above answers are "NO", then may proceed with Cephalosporin use.  . Levofloxacin Other (See Comments)    Reaction:  Unknown   . Sulfa Antibiotics Other (See Comments)    Reaction:  Unknown     REVIEW OF SYSTEMS:   Review of Systems  Constitutional: Negative for fever, weight loss, malaise/fatigue and diaphoresis.  HENT: Negative for ear discharge, ear pain, hearing loss, nosebleeds, sore throat and tinnitus.   Eyes: Negative for blurred vision and pain.  Respiratory: Negative for cough, hemoptysis, shortness of breath and wheezing.   Cardiovascular: Negative for chest pain, palpitations, orthopnea and leg swelling.  Gastrointestinal: Negative for heartburn, nausea, vomiting, abdominal pain, diarrhea, constipation and blood in stool.  Genitourinary: Negative for dysuria, urgency and frequency.  Musculoskeletal: Positive for back pain. Negative for myalgias.  Skin: Negative for itching and rash.  Neurological: Negative for dizziness, tingling, tremors, focal weakness, seizures, weakness and headaches.  Psychiatric/Behavioral: Negative for depression. The patient is not  nervous/anxious.    MEDICATIONS AT HOME:   Prior to Admission medications   Medication Sig Start Date End Date Taking? Authorizing Provider  albuterol (PROVENTIL HFA;VENTOLIN HFA) 108 (90 Base) MCG/ACT inhaler Inhale 1-2 puffs into the lungs every 6 (six) hours as needed for wheezing or shortness of breath. 08/17/15   Arlis Porta., MD  albuterol (PROVENTIL) (2.5 MG/3ML) 0.083% nebulizer solution Take 3 mLs (2.5 mg total) by nebulization every 6 (six) hours as needed for wheezing or shortness of breath. 08/31/15   Arlis Porta., MD  carvedilol (COREG) 6.25 MG tablet Take 6.25 mg by mouth 2 (two) times daily with a meal.    Historical Provider, MD  cetirizine (ZYRTEC ALLERGY) 10 MG tablet Take 1 tablet (10 mg total) by mouth daily. 09/14/15   Wilhelmina Mcardle, MD  dextromethorphan-guaiFENesin [facility] DM) 30-600 MG 12hr tablet Take 1 tablet by mouth 2 (two) times daily. 09/15/15   Amy Overton Mam, NP  doxycycline (VIBRA-TABS) 100 MG tablet Take 1  tablet (100 mg total) by mouth 2 (two) times daily. 09/15/15   Amy Overton Mam, NP  Fluticasone-Salmeterol (ADVAIR) 250-50 MCG/DOSE AEPB Inhale 1 puff into the lungs 2 (two) times daily.    Historical Provider, MD  furosemide (LASIX) 40 MG tablet Take 40-80 mg by mouth 2 (two) times daily. Pt takes two tablets in the morning and one tablet in the evening.    Historical Provider, MD  Multiple Vitamin (MULTIVITAMIN WITH MINERALS) TABS tablet Take 1 tablet by mouth daily.     Historical Provider, MD  omeprazole (PRILOSEC) 20 MG capsule Take 1 capsule (20 mg total) by mouth daily. 12/21/14   Arlis Porta., MD  potassium chloride (K-DUR) 10 MEQ tablet Take 1 tablet (10 mEq total) by mouth daily. 09/07/15   Arlis Porta., MD  ranitidine (ZANTAC) 150 MG tablet TAKE 1 TABLET BY MOUTH EVERY NIGHT AT BEDTIME 09/21/15   Arlis Porta., MD  Rivaroxaban (XARELTO) 15 MG TABS tablet Take 15 mg by mouth every evening.     Historical Provider, MD   tiotropium (SPIRIVA) 18 MCG inhalation capsule Place 18 mcg into inhaler and inhale daily.    Historical Provider, MD  traMADol (ULTRAM) 50 MG tablet Take 1 tablet (50 mg total) by mouth every 8 (eight) hours as needed. 09/26/15 09/25/16  Joanne Gavel, MD  vitamin B-12 (CYANOCOBALAMIN) 1000 MCG tablet Take 1,000 mcg by mouth daily.    Historical Provider, MD   VITAL SIGNS:  Blood pressure 122/76, pulse 68, temperature 97.8 F (36.6 C), temperature source Oral, resp. rate 16, weight 70.761 kg (156 lb), SpO2 96 %. PHYSICAL EXAMINATION:  Physical Exam  Constitutional: She is oriented to person, place, and time and well-developed, well-nourished, and in no distress.  HENT:  Head: Normocephalic and atraumatic.  Eyes: Conjunctivae and EOM are normal. Pupils are equal, round, and reactive to light.  Neck: Normal range of motion. Neck supple. No tracheal deviation present. No thyromegaly present.  Cardiovascular: Normal rate, regular rhythm and normal heart sounds.   Pulmonary/Chest: Effort normal and breath sounds normal. No respiratory distress. She has no wheezes. She exhibits no tenderness.  Abdominal: Soft. Bowel sounds are normal. She exhibits no distension. There is no tenderness.  Musculoskeletal:       Lumbar back: She exhibits decreased range of motion, tenderness and spasm.  Neurological: She is alert and oriented to person, place, and time. No cranial nerve deficit.  Skin: Skin is warm and dry. No rash noted.  Psychiatric: Mood and affect normal.   LABORATORY PANEL:   CBC  Recent Labs Lab 09/26/15 1415  WBC 8.6  HGB 11.5*  HCT 34.4*  PLT 196   ------------------------------------------------------------------------------------------------------------------  Chemistries   Recent Labs Lab 09/26/15 1415  NA 137  K 3.8  CL 89*  CO2 41*  GLUCOSE 191*  BUN 20  CREATININE 1.09*  CALCIUM 9.0    RADIOLOGY:  Dg Lumbar Spine 2-3 Views  09/26/2015  CLINICAL DATA:   Severe low back pain beginning on Sunday. Initial encounter. EXAM: LUMBAR SPINE - 2-3 VIEW COMPARISON:  Chest CT 04/13/2015 which includes the upper lumbar levels FINDINGS: Remote compression fractures of L1, L2, and L3 superior endplate with over 579FGE height loss at L1 and L2 when measured centrally. These compression fractures have a chronic appearance and were also seen in 2016. There is upper lumbar kyphosis from the vertebral body height loss. No acute fracture is seen. The lower thoracic spine is ankylosed with  thin syndesmophytes suggesting ankylosing spondylitis. Lumbar spondylosis. Disc calcification at the ankylosed levels. IMPRESSION: 1. No acute finding. 2. Chronic L1, L2, and L3 compression fractures. 3. Spondyloarthropathy with diffuse thoracic ankylosis. Electronically Signed   By: Monte Fantasia M.D.   On: 09/26/2015 15:16   Dg Pelvis 1-2 Views  09/26/2015  CLINICAL DATA:  Acute low back and bilateral lower extremity pain without known injury. EXAM: PELVIS - 1-2 VIEW COMPARISON:  None. FINDINGS: There is no evidence of pelvic fracture or diastasis. No pelvic bone lesions are seen. IMPRESSION: No acute abnormality seen in the pelvis. Electronically Signed   By: Marijo Conception, M.D.   On: 09/26/2015 15:15   IMPRESSION AND PLAN:  82 y.o. female with COPD with chronic home oxygen requirement, hypertension, chronic kidney disease, CHF, nonobstructive coronary artery disease who presents for evaluation of 4 days of bilateral hip pain radiating down the legs/thighs, gradual onset, constant since onset, moderate to severe and worse with movement  * Possible sciatica - Steroids per orthopedics ([place] side with Dr Sabra Heck) - X-ray is negative - pain management - Physical and occupational therapy - MRI of the lumbar spine - Care management and social worker consult for possible placement and/or home health needs  * COPD - On 3 L oxygen by nasal cannula chronically at home - Continue home  inhalers  * History of CHF - Not having any exacerbation - Continue home dose of Lasix and potassium  * CK D stage II - at Baseline    All the records are reviewed and case discussed with ED provider. Management plans discussed with the patient, family and they are in agreement.  CODE STATUS: FULL CODE  TOTAL TIME TAKING CARE OF THIS PATIENT: 45 minutes.    [facility], [name] [name] M.D on 09/26/2015 at 8:09 PM  Between 7am to 6pm - Pager - [phone]  After 6pm go to [email or web address] - password [email or web address] [place] [facility]  Office  [phone]  CC: Primary care physician; [name], MD   Note: This dictation was prepared with Dragon dictation along with smaller phrase technology. Any transcriptional errors that result from this process are unintentional.

## 2015-09-26 NOTE — Telephone Encounter (Signed)
Decided to call EMS and have her transferred to ER given there is now an area bulging in lower back. Patient upset that we haven't offered muscle relaxer. I did explain we could do Tylenol 3 if they could get her to ortho but with new pain in lower back it is best to have additional images.[facility]

## 2015-09-26 NOTE — ED Notes (Signed)
Pt given meal tray and drink.

## 2015-09-26 NOTE — ED Notes (Signed)
Pt from home via ems with reports that she began Saturday having left hip pain, pain has continued to worsen and now hurts into rt hip. Pt was seen here Saturday for same. Pt denies injury and states that the pain migrates to different areas at different times.

## 2015-09-26 NOTE — Telephone Encounter (Signed)
Ok-jh 

## 2015-09-26 NOTE — ED Provider Notes (Signed)
[facility] Emergency Department Provider Note  ____________________________________________  Time seen: Approximately 2:30 PM  I have reviewed the triage vital signs and the nursing notes.   HISTORY  Chief Complaint Hip Pain    HPI EUDELIA CAHOON is a 82 y.o. female with COPD with chronic home oxygen requirement, hypertension, chronic kidney disease, CHF, nonobstructive coronary artery disease who presents for evaluation of 4 days of bilateral hip pain radiating down the legs/thighs, gradual onset, constant since onset, moderate to severe and worse with movement. Patient was seen in this emergency department on 09/23/2015 for evaluation of similar pain complaints. She had plain films of the right and left hip which were negative for acute fracture and was discharged home. She reports her pain is worsened. She denies any falls or injury. She denies any chest pain, vomiting, diarrhea, fevers or chills per she has chronic shortness of breath which she reports is not increased from baseline. She is having some pain in her back and reports that previously when she had this type of pain it was related to "arthritis in my back". She denies any numbness or weakness, bowel or bladder incontinence or fevers.   Past Medical History  Diagnosis Date  . HTN (hypertension)   . COPD (chronic obstructive pulmonary disease) (HCC)     a. on home O2 nightly.  . Diverticulosis   . Takotsubo cardiomyopathy 10/2012    a. 10/2012: presented w/ cardiogenic shock/systolic CHF/VDRF - EF [id number] by cath, 40% by f/u echo - cath with mild nonobstructive CAD. b. Not discharged on ACEI due to hypotension.  . Cardiogenic shock ([place])     a. 10/2012 - due to Takotsubo CM.  [name] Respiratory failure ([place])     a. On home O2 nightly. b. VDRF 10/2012 - due to CAP/pulm edema in setting of cardiogenic shock/Takotsubo CM.  [name] CAP (community acquired pneumonia)     a. 10/2012 - will need f/u CXR in June 2014 to ensure  clearance.  . Esophageal stricture     a. s/p dilitation 10/2012.  [name] Pleural effusion, right     a. s/p thoracentesis 10/28/12.  . Atrial fibrillation ([place])     a. Dx 10/2012, rate controlled, started on Xarelto.  . Claudication ([place])     a. ABI 10/2012 - R 0.87, L 0.74.  [name] GERD (gastroesophageal reflux disease)   . CKD (chronic kidney disease), stage III   . Breast CA (HCC)     hx  . Abdominal hernia     chronic  . STEMI (ST elevation myocardial infarction) ([place])   . Cellulitis   . CHF (congestive heart failure) ([place])   . CAD (coronary artery disease)     a. 10/2012 - presented w/ STEMI felt due to Takotsubo - mild nonobstructive dz by cath.    Patient Active Problem List   Diagnosis Date Noted  . COPD with acute exacerbation ([place]) 06/27/2015  . Problems with swallowing and mastication   . Hiatal hernia   . Stricture and stenosis of esophagus   . Respiratory failure with hypoxia ([place]) 04/10/2015  . Diverticulitis large intestine 03/28/2015  . Acute on chronic respiratory failure with hypoxemia ([place]) 03/03/2015  . Essential hypertension 04/08/2014  . Chronic systolic heart failure ([place]) 04/30/2013  . PAD (peripheral artery disease) ([place]) 12/07/2012  . Atrial fibrillation ([place]) 10/31/2012  . Pleural effusion 10/29/2012  . Acute systolic CHF (congestive heart failure) ([place]) 10/28/2012  . Acute respiratory failure ([place]) 10/28/2012  . Hypokalemia  10/28/2012  . Cardiogenic shock ([place]) 10/28/2012  . GERD (gastroesophageal reflux disease) 10/28/2012  . Esophageal stricture 10/28/2012  . Community acquired pneumonia 10/26/2012  . Takotsubo syndrome 10/25/2012    Past Surgical History  Procedure Laterality Date  . Mastectomy    . Cataract extraction    . Esophagogastroduodenoscopy N/A 11/05/2012    Procedure: ESOPHAGOGASTRODUODENOSCOPY (EGD) with esophageal savary dilatation;  Surgeon: Missy Sabins, MD;  Location: [facility];  Service: Endoscopy;  Laterality: N/A;  savary  dil...no floro needed  . Bladder surgery    . Left heart catheterization with coronary angiogram N/A 10/24/2012    Procedure: LEFT HEART CATHETERIZATION WITH CORONARY ANGIOGRAM;  Surgeon: Sherren Mocha, MD;  Location: [facility] CATH LAB;  Service: Cardiovascular;  Laterality: N/A;  . Peripheral vascular catheterization N/A 02/07/2015    Procedure: Abdominal Aortogram w/Lower Extremity;  Surgeon: Katha Cabal, MD;  Location: [facility] CV LAB;  Service: Cardiovascular;  Laterality: N/A;  . Peripheral vascular catheterization  02/07/2015    Procedure: Lower Extremity Intervention;  Surgeon: Katha Cabal, MD;  Location: [facility] CV LAB;  Service: Cardiovascular;;  . Coronary angioplasty    . Esophagogastroduodenoscopy (egd) with propofol N/A 05/23/2015    Procedure: ESOPHAGOGASTRODUODENOSCOPY (EGD) WITH PROPOFOL;  Surgeon: Lucilla Lame, MD;  Location: [facility] ENDOSCOPY;  Service: Endoscopy;  Laterality: N/A;    Current Outpatient Rx  Name  Route  Sig  Dispense  Refill  . albuterol (PROVENTIL HFA;VENTOLIN HFA) 108 (90 Base) MCG/ACT inhaler   Inhalation   Inhale 1-2 puffs into the lungs every 6 (six) hours as needed for wheezing or shortness of breath.   18 g   3   . albuterol (PROVENTIL) (2.5 MG/3ML) 0.083% nebulizer solution   Nebulization   Take 3 mLs (2.5 mg total) by nebulization every 6 (six) hours as needed for wheezing or shortness of breath.   120 mL   12   . carvedilol (COREG) 6.25 MG tablet   Oral   Take 6.25 mg by mouth 2 (two) times daily with a meal.         . cetirizine (ZYRTEC ALLERGY) 10 MG tablet   Oral   Take 1 tablet (10 mg total) by mouth daily.   30 tablet   11   . dextromethorphan-guaiFENesin (MUCINEX DM) 30-600 MG 12hr tablet   Oral   Take 1 tablet by mouth 2 (two) times daily.   20 tablet   0   . doxycycline (VIBRA-TABS) 100 MG tablet   Oral   Take 1 tablet (100 mg total) by mouth 2 (two) times daily.   14 tablet   0   .  Fluticasone-Salmeterol (ADVAIR) 250-50 MCG/DOSE AEPB   Inhalation   Inhale 1 puff into the lungs 2 (two) times daily.         . furosemide (LASIX) 40 MG tablet   Oral   Take 40-80 mg by mouth 2 (two) times daily. Pt takes two tablets in the morning and one tablet in the evening.         . Multiple Vitamin (MULTIVITAMIN WITH MINERALS) TABS tablet   Oral   Take 1 tablet by mouth daily.          [name] omeprazole (PRILOSEC) 20 MG capsule   Oral   Take 1 capsule (20 mg total) by mouth daily.   30 capsule   11   . potassium chloride (K-DUR) 10 MEQ tablet   Oral   Take 1 tablet (10 mEq total)  by mouth daily.   30 tablet   12   . ranitidine (ZANTAC) 150 MG tablet      TAKE 1 TABLET BY MOUTH EVERY NIGHT AT BEDTIME   30 tablet   6   . Rivaroxaban (XARELTO) 15 MG TABS tablet   Oral   Take 15 mg by mouth every evening.          . tiotropium (SPIRIVA) 18 MCG inhalation capsule   Inhalation   Place 18 mcg into inhaler and inhale daily.         . vitamin B-12 (CYANOCOBALAMIN) 1000 MCG tablet   Oral   Take 1,000 mcg by mouth daily.           Allergies Penicillins; Levofloxacin; and Sulfa antibiotics  Family History  Problem Relation Age of Onset  . Heart attack Mother   . Stroke Mother   . COPD Son   . Pneumonia Father     Social History Social History  Substance Use Topics  . Smoking status: Former Smoker -- 1.00 packs/day for 55 years    Types: Cigarettes    Quit date: 06/15/2007  . Smokeless tobacco: Former Systems developer  . Alcohol Use: No    Review of Systems Constitutional: No fever/chills Eyes: No visual changes. ENT: No sore throat. Cardiovascular: Denies chest pain. Respiratory: Denies shortness of breath. Gastrointestinal: No abdominal pain.  No nausea, no vomiting.  No diarrhea.  No constipation. Genitourinary: Negative for dysuria. Musculoskeletal: Positive for lower back pain. Skin: Negative for rash. Neurological: Negative for headaches, focal  weakness or numbness.  10-point ROS otherwise negative.  ____________________________________________   PHYSICAL EXAM:  VITAL SIGNS: ED Triage Vitals  Enc Vitals Group     BP 09/26/15 1339 115/54 mmHg     Pulse Rate 09/26/15 1339 70     Resp 09/26/15 1339 20     Temp 09/26/15 1339 97.5 F (36.4 C)     Temp Source 09/26/15 1339 Oral     SpO2 09/26/15 1339 98 %     Weight 09/26/15 1339 156 lb (70.761 kg)     Height --      Head Cir --      Peak Flow --      Pain Score 09/26/15 1340 10     Pain Loc --      Pain Edu? --      Excl. in [place]? --     Constitutional: Alert and oriented. Well appearing and in no acute distress until she moves in the beg. Eyes: Conjunctivae are normal. PERRL. EOMI. Head: Atraumatic. Nose: No congestion/rhinnorhea. Mouth/Throat: Mucous membranes are moist.  Oropharynx non-erythematous. Neck: No stridor.  Supple without meningismus. Cardiovascular: Normal rate, regular rhythm. Grossly normal heart sounds.  Good peripheral circulation. Respiratory: Normal respiratory effort.  No retractions. Very faint expiratory wheeze with good air movement bilaterally. Gastrointestinal: Soft and nontender. No distention.  No CVA tenderness. Genitourinary: deferred Musculoskeletal: No lower extremity tenderness nor edema.  No joint effusions. No midline C or T-spine tenderness to palpation. Mild tender to palpation throughout the Of the lumbar spine as well as the right and left paravertebral muscles associated with the lumbar spine. Full active range of motion at the hip joints bilaterally. Positive straight leg raise in the right and left leg at 35-45. 2+ DP pulse in bilateral feet. 1-2+ pitting edema in the feet bilaterally. Neurologic:  Normal speech and language. No gross focal neurologic deficits are appreciated. No or seizures in the saddle distribution. Normal  strength of dorsiflexion of the big toes bilaterally. Skin:  Skin is warm, dry and intact. No rash  noted. Psychiatric: Mood and affect are normal. Speech and behavior are normal.  ____________________________________________   LABS (all labs ordered are listed, but only abnormal results are displayed)  Labs Reviewed  CBC WITH DIFFERENTIAL/PLATELET - Abnormal; Notable for the following:    RBC 3.70 (*)    Hemoglobin 11.5 (*)    HCT 34.4 (*)    Neutro Abs 6.8 (*)    All other components within normal limits  BASIC METABOLIC PANEL - Abnormal; Notable for the following:    Chloride 89 (*)    CO2 41 (*)    Glucose, Bld 191 (*)    Creatinine, Ser 1.09 (*)    GFR calc non Af Amer 43 (*)    GFR calc Af Amer 50 (*)    All other components within normal limits  URINALYSIS COMPLETEWITH MICROSCOPIC ([facility] ONLY) - Abnormal; Notable for the following:    Color, Urine STRAW (*)    APPearance CLEAR (*)    Hgb urine dipstick 1+ (*)    Squamous Epithelial / LPF 0-5 (*)    All other components within normal limits  PROTIME-INR  APTT   ____________________________________________  EKG  None ____________________________________________  RADIOLOGY  Xray lumbar spine - pending  Xray pelvis - pending ____________________________________________   PROCEDURES  Procedure(s) performed: None  Critical Care performed: No  ____________________________________________   INITIAL IMPRESSION / ASSESSMENT AND PLAN / ED COURSE  Pertinent labs & imaging results that were available during my care of the patient were reviewed by me and considered in my medical decision making (see chart for details).  TALULLA KIRSTEIN is a 82 y.o. female with COPD with chronic home oxygen requirement, hypertension, chronic kidney disease, CHF, nonobstructive coronary artery disease who presents for evaluation of 4 days of bilateral hip pain radiating down the legs/thighs. On exam, she is nontoxic appearing though she does have pain with movement. She is neurovascularly intact in bilateral lower extremities and  has intact strength exam. No red thighs concerning for cauda equina or epidural abscess. I suspect that her pain may be related to sciatica given pain radiating down the posterior aspect of the leg bilaterally and history of "arthritis in my back". Plan for Pain control, obtain screening labs and reassess for disposition. We'll obtain plain films.  ----------------------------------------- 3:13 PM on 09/26/2015 ----------------------------------------- Labs reviewed and show chronic abnormalities which are essentially unchanged from prior. Mild anemia, mild Cr elevation which is at baseline. Urinalysis is not consistent with infection. Imaging pending. Still awaiting pain control. Care transferred to Dr. Archie Balboa at this time. ____________________________________________   FINAL CLINICAL IMPRESSION(S) / ED DIAGNOSES  Final diagnoses:  Bilateral low back pain with sciatica, sciatica laterality unspecified      Joanne Gavel, MD 09/26/15 1514

## 2015-09-26 NOTE — Telephone Encounter (Signed)
I would like fior her to see an Ortho at [facility] ASAP re:  this pain.  ER visit evaluated bilateral hip pain.  If pain is in back now, may need more x-rays at different site.  I will write a prescription for Tylenol #3 for her to take until she can be seen by Ortho.  If pain is so severe that she cannot get to Ortho, I recommend taking her back to ER for further evaluation.-jh

## 2015-09-26 NOTE — Telephone Encounter (Signed)
Patient's care giver called back this morning asking for something else for pain. Patient is still having back muscle pain.

## 2015-09-27 ENCOUNTER — Observation Stay: Payer: Medicare Other

## 2015-09-27 DIAGNOSIS — M5137 Other intervertebral disc degeneration, lumbosacral region: Secondary | ICD-10-CM | POA: Diagnosis not present

## 2015-09-27 DIAGNOSIS — J449 Chronic obstructive pulmonary disease, unspecified: Secondary | ICD-10-CM | POA: Diagnosis not present

## 2015-09-27 DIAGNOSIS — M25559 Pain in unspecified hip: Secondary | ICD-10-CM | POA: Diagnosis not present

## 2015-09-27 DIAGNOSIS — E876 Hypokalemia: Secondary | ICD-10-CM | POA: Diagnosis not present

## 2015-09-27 DIAGNOSIS — N182 Chronic kidney disease, stage 2 (mild): Secondary | ICD-10-CM | POA: Diagnosis not present

## 2015-09-27 LAB — BASIC METABOLIC PANEL
Anion gap: 7 (ref 5–15)
BUN: 20 mg/dL (ref 6–20)
CHLORIDE: 91 mmol/L — AB (ref 101–111)
CO2: 38 mmol/L — AB (ref 22–32)
CREATININE: 1.03 mg/dL — AB (ref 0.44–1.00)
Calcium: 8.5 mg/dL — ABNORMAL LOW (ref 8.9–10.3)
GFR calc Af Amer: 53 mL/min — ABNORMAL LOW (ref >60)
GFR calc non Af Amer: 46 mL/min — ABNORMAL LOW (ref >60)
Glucose, Bld: 211 mg/dL — ABNORMAL HIGH (ref 65–99)
Potassium: 3.2 mmol/L — ABNORMAL LOW (ref 3.5–5.1)
Sodium: 136 mmol/L (ref 135–145)

## 2015-09-27 LAB — GLUCOSE, CAPILLARY: GLUCOSE-CAPILLARY: 182 mg/dL — AB (ref 65–99)

## 2015-09-27 LAB — CBC
HCT: 34.1 % — ABNORMAL LOW (ref 35.0–47.0)
Hemoglobin: 11.6 g/dL — ABNORMAL LOW (ref 12.0–16.0)
MCH: 32 pg (ref 26.0–34.0)
MCHC: 34.1 g/dL (ref 32.0–36.0)
MCV: 93.9 fL (ref 80.0–100.0)
PLATELETS: 182 10*3/uL (ref 150–440)
RBC: 3.63 MIL/uL — ABNORMAL LOW (ref 3.80–5.20)
RDW: 14 % (ref 11.5–14.5)
WBC: 4.2 10*3/uL (ref 3.6–11.0)

## 2015-09-27 LAB — MAGNESIUM: MAGNESIUM: 1.9 mg/dL (ref 1.7–2.4)

## 2015-09-27 MED ORDER — POLYETHYLENE GLYCOL 3350 17 G PO PACK
17.0000 g | PACK | Freq: Every day | ORAL | Status: DC
  Administered 2015-09-27 – 2015-09-29 (×3): 17 g via ORAL
  Filled 2015-09-27 (×3): qty 1

## 2015-09-27 MED ORDER — SENNA 8.6 MG PO TABS
2.0000 | ORAL_TABLET | Freq: Two times a day (BID) | ORAL | Status: DC
  Administered 2015-09-27 – 2015-09-29 (×5): 17.2 mg via ORAL
  Filled 2015-09-27 (×5): qty 2

## 2015-09-27 MED ORDER — CYCLOBENZAPRINE HCL 10 MG PO TABS
5.0000 mg | ORAL_TABLET | Freq: Three times a day (TID) | ORAL | Status: DC
  Administered 2015-09-27 – 2015-09-29 (×8): 5 mg via ORAL
  Filled 2015-09-27 (×8): qty 1

## 2015-09-27 MED ORDER — HYDROMORPHONE HCL 1 MG/ML IJ SOLN
1.0000 mg | INTRAMUSCULAR | Status: DC | PRN
  Administered 2015-09-29: 1 mg via INTRAVENOUS
  Filled 2015-09-27: qty 1

## 2015-09-27 MED ORDER — OXYCODONE HCL 5 MG PO TABS
5.0000 mg | ORAL_TABLET | ORAL | Status: DC | PRN
  Administered 2015-09-27 (×3): 5 mg via ORAL
  Filled 2015-09-27 (×3): qty 1

## 2015-09-27 MED ORDER — MORPHINE SULFATE (PF) 4 MG/ML IV SOLN: 4.0000 mg | INTRAVENOUS | Status: DC | PRN

## 2015-09-27 NOTE — Progress Notes (Signed)
PT Cancellation Note  Patient Details Name: [name] [name] MRN: [id number] DOB: [birth year]   Cancelled Treatment:    Reason Eval/Treat Not Completed: Patient at procedure or test/unavailable. Pt currently off the floor for MRI testing of lumbar spine. PT will re-attempt when patient is available and appropriate.   Kerman Passey, PT, DPT    09/27/2015, 10:31 AM

## 2015-09-27 NOTE — Progress Notes (Signed)
[place] [facility] [place] NAME: [name] [name]    MR#:  [id number]  DATE OF BIRTH:  Mar 11, 1923  SUBJECTIVE:  CHIEF COMPLAINT:   Chief Complaint  Patient presents with  . Hip Pain  Still having lots of pain, unable to lie flat. 8/10 pain. REVIEW OF SYSTEMS:  Review of Systems  Constitutional: Negative for fever, weight loss, malaise/fatigue and diaphoresis.  HENT: Negative for ear discharge, ear pain, hearing loss, nosebleeds, sore throat and tinnitus.   Eyes: Negative for blurred vision and pain.  Respiratory: Negative for cough, hemoptysis, shortness of breath and wheezing.   Cardiovascular: Negative for chest pain, palpitations, orthopnea and leg swelling.  Gastrointestinal: Negative for heartburn, nausea, vomiting, abdominal pain, diarrhea, constipation and blood in stool.  Genitourinary: Negative for dysuria, urgency and frequency.  Musculoskeletal: Positive for back pain. Negative for myalgias.  Skin: Negative for itching and rash.  Neurological: Negative for dizziness, tingling, tremors, focal weakness, seizures, weakness and headaches.  Psychiatric/Behavioral: Negative for depression. The patient is not nervous/anxious.     DRUG ALLERGIES:   Allergies  Allergen Reactions  . Penicillins Itching, Swelling, Rash and Other (See Comments)    Reaction:  Arm swelling  Has patient had a PCN reaction causing immediate rash, facial/tongue/throat swelling, SOB or lightheadedness with hypotension: No Has patient had a PCN reaction causing severe rash involving mucus membranes or skin necrosis: No Has patient had a PCN reaction that required hospitalization No Has patient had a PCN reaction occurring within the last 10 years: No If all of the above answers are "NO", then may proceed with Cephalosporin use.  . Levofloxacin Other (See Comments)    Reaction:  Unknown   . Sulfa Antibiotics Other (See Comments)    Reaction:  Unknown    VITALS:   Blood pressure 140/71, pulse 70, temperature 97.7 F (36.5 C), temperature source Oral, resp. rate 18, height 5\' 7"  (1.702 m), weight 73.392 kg (161 lb 12.8 oz), SpO2 98 %. PHYSICAL EXAMINATION:  Physical Exam  Constitutional: She is oriented to person, place, and time and well-developed, well-nourished, and in no distress.  HENT:  Head: Normocephalic and atraumatic.  Eyes: Conjunctivae and EOM are normal. Pupils are equal, round, and reactive to light.  Neck: Normal range of motion. Neck supple. No tracheal deviation present. No thyromegaly present.  Cardiovascular: Normal rate, regular rhythm and normal heart sounds.   Pulmonary/Chest: Effort normal and breath sounds normal. No respiratory distress. She has no wheezes. She exhibits no tenderness.  Abdominal: Soft. Bowel sounds are normal. She exhibits no distension. There is no tenderness.  Musculoskeletal:       Lumbar back: She exhibits decreased range of motion, tenderness and spasm.  Positive straight leg raise in the right and left leg at 35-45  Neurological: She is alert and oriented to person, place, and time. No cranial nerve deficit.  Skin: Skin is warm and dry. No rash noted.  Psychiatric: Mood and affect normal.   LABORATORY PANEL:   CBC  Recent Labs Lab 09/27/15 0538  WBC 4.2  HGB 11.6*  HCT 34.1*  PLT 182   ------------------------------------------------------------------------------------------------------------------ Chemistries   Recent Labs Lab 09/27/15 0538  NA 136  K 3.2*  CL 91*  CO2 38*  GLUCOSE 211*  BUN 20  CREATININE 1.03*  CALCIUM 8.5*   RADIOLOGY:  Dg Lumbar Spine 2-3 Views  09/26/2015  CLINICAL DATA:  Severe low back pain beginning on Sunday. Initial encounter. EXAM: LUMBAR SPINE -  2-3 VIEW COMPARISON:  Chest CT 04/13/2015 which includes the upper lumbar levels FINDINGS: Remote compression fractures of L1, L2, and L3 superior endplate with over 579FGE height loss at L1 and L2 when measured  centrally. These compression fractures have a chronic appearance and were also seen in 2016. There is upper lumbar kyphosis from the vertebral body height loss. No acute fracture is seen. The lower thoracic spine is ankylosed with thin syndesmophytes suggesting ankylosing spondylitis. Lumbar spondylosis. Disc calcification at the ankylosed levels. IMPRESSION: 1. No acute finding. 2. Chronic L1, L2, and L3 compression fractures. 3. Spondyloarthropathy with diffuse thoracic ankylosis. Electronically Signed   By: Monte Fantasia M.D.   On: 09/26/2015 15:16   Dg Pelvis 1-2 Views  09/26/2015  CLINICAL DATA:  Acute low back and bilateral lower extremity pain without known injury. EXAM: PELVIS - 1-2 VIEW COMPARISON:  None. FINDINGS: There is no evidence of pelvic fracture or diastasis. No pelvic bone lesions are seen. IMPRESSION: No acute abnormality seen in the pelvis. Electronically Signed   By: Marijo Conception, M.D.   On: 09/26/2015 15:15   ASSESSMENT AND PLAN:  82 y.o. female with COPD with chronic home oxygen requirement, hypertension, chronic kidney disease, CHF, nonobstructive coronary artery disease who presents for evaluation of 4 days of bilateral hip pain radiating down the legs/thighs, gradual onset, constant since onset, moderate to severe and worse with movement  * Possible sciatica - Prednisone 50 mg PO daily - X-ray is negative - pain management - on Oxycodone, add Dilaudid, stop morphine, add flexeril - Physical and occupational therapy eval pending - MRI of the lumbar spine - not completed as she is still in lots of pain and unable to lie flat. - Care management and social worker consult for possible placement and/or home health needs - pt prefers [facility]  * Hypokalemia - Replete and recheck - Check Mg  * COPD - On 3 L oxygen by nasal cannula chronically at home - Continue home inhalers  * History of CHF - Not having any exacerbation - Continue home dose of Lasix and potassium  *  CKD stage II - at Baseline    All the records are reviewed and case discussed with Care Management/Social Worker. Management plans discussed with the patient and she is in agreement.  CODE STATUS: FULL CODE  TOTAL TIME TAKING CARE OF THIS PATIENT: 35 minutes.   More than 50% of the time was spent in counseling/coordination of care: YES  POSSIBLE D/C IN 1-2 DAYS, DEPENDING ON CLINICAL CONDITION. And MRI, Ortho eval   [facility], Jazmaine Fuelling M.D on 09/27/2015 at 11:21 AM  Between 7am to 6pm - Pager - [phone]  After 6pm go to [email or web address] - password [email or web address] [facility]  Office  [phone]  CC: Primary care physician; [name], MD  Note: This dictation was prepared with Dragon dictation along with smaller phrase technology. Any transcriptional errors that result from this process are unintentional.

## 2015-09-27 NOTE — Progress Notes (Signed)
Notified MD of patient not doing MRI. Radiology stated to this writer would not be able to attempt test this date. MD Manuella Ghazi) aware.

## 2015-09-27 NOTE — Clinical Social Work Placement (Signed)
   CLINICAL SOCIAL WORK PLACEMENT  NOTE  Date:  09/27/2015  Patient Details  Name: [name] [name] MRN: [id number] Date of Birth: 1923/02/27  Clinical Social Work is seeking post-discharge placement for this patient at the [place] level of care (*CSW will initial, date and re-position this form in  chart as items are completed):  Yes   Patient/family provided with [facility] Work Department's list of facilities offering this level of care within the geographic area requested by the patient (or if unable, by the patient's family).  Yes   Patient/family informed of their freedom to choose among providers that offer the needed level of care, that participate in Medicare, Medicaid or managed care program needed by the patient, have an available bed and are willing to accept the patient.  Yes   Patient/family informed of [facility]'s ownership interest in [facility] and [facility], as well as of the fact that they are under no obligation to receive care at these facilities.  PASRR submitted to EDS on 09/27/15     PASRR number received on 09/27/15     Existing PASRR number confirmed on       FL2 transmitted to all facilities in geographic area requested by pt/family on 09/27/15     FL2 transmitted to all facilities within larger geographic area on       Patient informed that his/her managed care company has contracts with or will negotiate with certain facilities, including the following:            Patient/family informed of bed offers received.  Patient chooses bed at       Physician recommends and patient chooses bed at      Patient to be transferred to   on  .  Patient to be transferred to facility by       Patient family notified on   of transfer.  Name of family member notified:        PHYSICIAN Please sign FL2     Additional Comment:    _______________________________________________ Loralyn Freshwater, LCSW 09/27/2015, 4:30  PM

## 2015-09-27 NOTE — NC FL2 (Signed)
[place] LEVEL OF CARE SCREENING TOOL     IDENTIFICATION  Patient Name: [name] [name] Birthdate: Oct 29, 1922 Sex: female Admission Date (Current Location): 09/26/2015  [place] and [place] Number:  Engineering geologist and Address:  [facility], [address], [place], [place] [address]      Provider Number: [id number]  Attending Physician Name and Address:  Max Sane, MD  Relative Name and Phone Number:       Current Level of Care: Hospital Recommended Level of Care: [place] Prior Approval Number:    Date Approved/Denied:   PASRR Number:  [id number] SS #:  [id number]  Discharge Plan: SNF    Current Diagnoses: Patient Active Problem List   Diagnosis Date Noted  . Back pain 09/26/2015  . COPD with acute exacerbation ([place]) 06/27/2015  . Problems with swallowing and mastication   . Hiatal hernia   . Stricture and stenosis of esophagus   . Respiratory failure with hypoxia ([place]) 04/10/2015  . Diverticulitis large intestine 03/28/2015  . Acute on chronic respiratory failure with hypoxemia ([place]) 03/03/2015  . Essential hypertension 04/08/2014  . Chronic systolic heart failure ([place]) 04/30/2013  . PAD (peripheral artery disease) ([place]) 12/07/2012  . Atrial fibrillation ([place]) 10/31/2012  . Pleural effusion 10/29/2012  . Acute systolic CHF (congestive heart failure) ([place]) 10/28/2012  . Acute respiratory failure ([place]) 10/28/2012  . Hypokalemia 10/28/2012  . Cardiogenic shock ([place]) 10/28/2012  . GERD (gastroesophageal reflux disease) 10/28/2012  . Esophageal stricture 10/28/2012  . Community acquired pneumonia 10/26/2012  . Takotsubo syndrome 10/25/2012    Orientation RESPIRATION BLADDER Height & Weight     Self, Time, Situation, Place  O2 (2L) Continent Weight: 161 lb 12.8 oz (73.392 kg) Height:  5\' 7"  (170.2 cm)  BEHAVIORAL SYMPTOMS/MOOD NEUROLOGICAL BOWEL NUTRITION STATUS  Other (Comment) (none)   Continent  Diet (regular)  AMBULATORY STATUS COMMUNICATION OF NEEDS Skin   Extensive Assist Verbally Normal                       Personal Care Assistance Level of Assistance  Bathing, Feeding, Dressing Bathing Assistance: Limited assistance Feeding assistance: Limited assistance Dressing Assistance: Limited assistance     Functional Limitations Info  Sight, Hearing, Speech Sight Info: Impaired Hearing Info: Impaired Speech Info: Impaired    SPECIAL CARE FACTORS FREQUENCY  PT (By licensed PT), OT (By licensed OT)     PT Frequency: 5 OT Frequency: 5            Contractures Contractures Info: Not present    Additional Factors Info  Code Status, Allergies, Psychotropic, Insulin Sliding Scale, Isolation Precautions Code Status Info: Full Allergies Info: Penicillins, Levofloxacin, Sulfa Antibiotics Psychotropic Info: none Insulin Sliding Scale Info: none Isolation Precautions Info: none     Current Medications (09/27/2015):  This is the current hospital active medication list Current Facility-Administered Medications  Medication Dose Route Frequency Provider Last Rate Last Dose  . 0.9 %  sodium chloride infusion   Intravenous Continuous Max Sane, MD 75 mL/hr at 09/26/15 2210    . acetaminophen (TYLENOL) tablet 650 mg  650 mg Oral Q6H PRN Max Sane, MD       Or  . acetaminophen (TYLENOL) suppository 650 mg  650 mg Rectal Q6H PRN Max Sane, MD      . albuterol (PROVENTIL) (2.5 MG/3ML) 0.083% nebulizer solution 2.5 mg  2.5 mg Nebulization Q6H PRN Max Sane, MD   2.5 mg at 09/26/15  2226  . bisacodyl (DULCOLAX) EC tablet 5 mg  5 mg Oral Daily PRN Max Sane, MD      . carvedilol (COREG) tablet 6.25 mg  6.25 mg Oral BID WC Max Sane, MD   6.25 mg at 09/27/15 0836  . cyclobenzaprine (FLEXERIL) tablet 5 mg  5 mg Oral TID Max Sane, MD      . furosemide (LASIX) tablet 40 mg  40 mg Oral BID Max Sane, MD   40 mg at 09/27/15 0836  . HYDROmorphone (DILAUDID) injection 1 mg  1 mg  Intravenous Q2H PRN Max Sane, MD      . loratadine (CLARITIN) tablet 10 mg  10 mg Oral Daily Max Sane, MD   10 mg at 09/27/15 0836  . mometasone-formoterol (DULERA) 200-5 MCG/ACT inhaler 2 puff  2 puff Inhalation BID Max Sane, MD   2 puff at 09/27/15 0835  . multivitamin with minerals tablet 1 tablet  1 tablet Oral Daily Max Sane, MD   1 tablet at 09/27/15 0836  . ondansetron (ZOFRAN) tablet 4 mg  4 mg Oral Q6H PRN Max Sane, MD       Or  . ondansetron (ZOFRAN) injection 4 mg  4 mg Intravenous Q6H PRN Vipul Shah, MD      . oxyCODONE (Oxy IR/ROXICODONE) immediate release tablet 5 mg  5 mg Oral Q4H PRN Theodoro Grist, MD      . pantoprazole (PROTONIX) EC tablet 40 mg  40 mg Oral Daily Max Sane, MD   40 mg at 09/27/15 0837  . polyethylene glycol (MIRALAX / GLYCOLAX) packet 17 g  17 g Oral Daily Vipul Shah, MD      . potassium chloride (K-DUR,KLOR-CON) CR tablet 10 mEq  10 mEq Oral Daily Max Sane, MD   10 mEq at 09/27/15 0836  . predniSONE (DELTASONE) tablet 50 mg  50 mg Oral Q breakfast Max Sane, MD   50 mg at 09/27/15 0837  . Rivaroxaban (XARELTO) tablet 15 mg  15 mg Oral QPM Vipul Shah, MD   15 mg at 09/26/15 2210  . senna (SENOKOT) tablet 17.2 mg  2 tablet Oral BID Max Sane, MD      . tiotropium [facility]) inhalation capsule 18 mcg  18 mcg Inhalation Daily Max Sane, MD   18 mcg at 09/27/15 0835  . traZODone (DESYREL) tablet 25 mg  25 mg Oral QHS PRN Max Sane, MD      . vitamin B-12 (CYANOCOBALAMIN) tablet 1,000 mcg  1,000 mcg Oral Daily Max Sane, MD   1,000 mcg at 09/27/15 [id number]     Discharge Medications: Please see discharge summary for a list of discharge medications.  Relevant Imaging Results:  Relevant Lab Results:   Additional Information    Lilly [place], LCSW

## 2015-09-27 NOTE — Progress Notes (Signed)
PT Cancellation Note  Patient Details Name: [name] MRN: [id number] DOB: Oct 15, 1922   Cancelled Treatment:    Reason Eval/Treat Not Completed: Patient at procedure or test/unavailable. PT made second attempt to see patient, she is currently off the floor again as she was unable to complete MRI earlier. PT will re-attempt as patient is available and appropriate.   Kerman Passey, PT, DPT    09/27/2015, 2:25 PM

## 2015-09-27 NOTE — Care Management Obs Status (Signed)
[place] NOTIFICATION   Patient Details  Name: [name] [name] MRN: [id number] Date of Birth: February 23, 1923   Medicare Observation Status Notification Given:  Yes    Marshell Garfinkel, RN 09/27/2015, 9:23 AM

## 2015-09-27 NOTE — Care Management Note (Signed)
Case Management Note  Patient Details  Name: AVALYNN BOWE MRN: [id number] Date of Birth: Jun 17, 1922  Subjective/Objective:                  Met with patient. They were not able to do MRI because patient could not lay flat per nursing. Patient states she is from home alone followed by [facility]. She has a walker available for use at home. Her PCP is Dr. Luan Pulling. She denies difficulty obtaining Rx. She wants to go to SNF- [facility]. PT pending. Chronic O2 through [employer].   Action/Plan: Arville Go has been notified of patient admission. RNCM will continue to follow.   Expected Discharge Date:  09/28/15               Expected Discharge Plan:     In-House Referral:     Discharge planning Services  CM Consult  Post Acute Care Choice:  Home Health Choice offered to:  Patient  DME Arranged:    DME Agency:     HH Arranged:    HH Agency:  [facility]  Status of Service:  In process, will continue to follow  Medicare Important Message Given:    Date Medicare IM Given:    Medicare IM give by:    Date Additional Medicare IM Given:    Additional Medicare Important Message give by:     If discussed at [place] of Stay Meetings, dates discussed:    Additional Comments:  Marshell Garfinkel, RN 09/27/2015, 11:18 AM

## 2015-09-27 NOTE — Progress Notes (Signed)
Pain med administered prior to MRI.

## 2015-09-27 NOTE — Evaluation (Signed)
Occupational Therapy Evaluation Patient Details Name: [name] [name] MRN: [id number] DOB: 1922/11/14 Today's Date: 09/27/2015    History of Present Illness Pt admitted for complaints of back pain. Pt. Has a history of COPD with 2LO2 at home.   Clinical Impression   Pt. Is a 82 y.o female who was admitted to [facility] secondary to back pain. Pt. Presents with severs back pain with difficulty finding a position of comfort. Pt. Was unable to tolerate the MRI. Nursing is planning to have pt. scheduled for another attempt at the MRI. Pt. Pain and limited tolerance for activity hinders her ability to perform ADL and IADL tasks. Pt. Could benefit from skilled OT services to improve ADL and IADL functioning, review A/E, and work simplification techniques.     Follow Up Recommendations  SNF    Equipment Recommendations       Recommendations for Other Services       Precautions / Restrictions        Mobility Bed Mobility      Pt. Seen for bedside eval, secondary to pain, and waiting a 2nd attempt for MRI.            Transfers                      Balance                                            ADL Overall ADL's : Needs assistance/impaired Eating/Feeding: Independent   Grooming: Set up;Minimal assistance               Lower Body Dressing: Maximal assistance                       Vision     Perception     Praxis      Pertinent Vitals/Pain Pain Assessment: 0-10 Pain Score: 6  (At rest.) Pt. Has difficulty finding a position of comfort.     Hand Dominance Right   Extremity/Trunk Assessment             Communication Communication Communication: No difficulties   Cognition                           General Comments       Exercises       Shoulder Instructions      Home Living Family/patient expects to be discharged to:: Private residence Living Arrangements: Alone Available Help at Discharge:  Friend(s);Available PRN/intermittently Type of Home: House Home Access: Level entry     Home Layout: One level     Bathroom Shower/Tub: Tub/shower unit;Curtain Shower/tub characteristics: Architectural technologist: Standard     Home Equipment: Cane - single point          Prior Functioning/Environment Level of [place]: Independent        Comments: Pt was indep with household     OT Diagnosis: Generalized weakness   OT Problem List: Decreased strength;Decreased range of motion;Decreased activity tolerance;Pain;Decreased knowledge of use of DME or AE   OT Treatment/Interventions: Self-care/ADL training;Therapeutic exercise;Therapeutic activities;Patient/family education;DME and/or AE instruction;Energy conservation    OT Goals(Current goals can be found in the care plan section) Acute Rehab OT Goals Patient Stated Goal: To return home. OT Goal Formulation: With patient  OT Frequency: Min 1X/week  Barriers to D/C:            Co-evaluation              End of Session Nurse Communication: Other (comment) (Pt. pain, plans to try another MRI, as pt. was unable to tolerate 1st attempt.)  Activity Tolerance: Patient tolerated treatment well Patient left: in bed;with call bell/phone within reach;with bed alarm set   Time: 1030-1100 OT Time Calculation (min): 30 min Charges:  OT General Charges $OT Visit: 1 Procedure OT Evaluation $OT Eval Moderate Complexity: 1 Procedure G-Codes: OT G-codes **NOT FOR INPATIENT CLASS** Functional Assessment Tool Used: clinical judgement based on pt.'s current functional status.  Angela Cox, MS, OTR/L 09/27/2015, 11:53 AM

## 2015-09-27 NOTE — Consult Note (Signed)
   [facility] CM Inpatient Consult   09/27/2015  [name] [name] May 13, 1923 [id number]   Attempted to visit patient earlier this shift but patient was out of the room.Patient is currently active with [facility] for chronic disease management services.  Patient has been engaged by a Market researcher.  Our community based plan of care has focused on disease management and community resource support.  Patient will receive a post discharge transition of care call and will be evaluated for monthly home visits for assessments and disease process education.  Made Inpatient Case Manager aware that [facility] following. Of note, [facility] Care Management services does not replace or interfere with any services that are needed or arranged by inpatient case management or social work.  For additional questions or referrals please contact:  Azarius Lambson RN, [facility] Liaison  [phone]) [place] [phone]) Toll free office

## 2015-09-27 NOTE — Progress Notes (Signed)
SNF and Non-Emergent EMS Transport Benefits:  Number called: [phone] Rep:  Summer Reference Number: [id number]  [employer] Medicare Complete HMO Plan One active as of 06/11/15 with no deductible.  Out of pocket max is $4900, of which [phone] met so far.  In-network SNF: $0 copay for days 1-20, a $160 daily copay for days 21-51, and a $0 copay for days 52-100.  Once out of pocket is reached, patient covered at 100% for remainder of 100 day benefit period. Per rep, patient has used a total of 24 skilled days in her current benefit period. $0 copay for professional fees and 3 day hospital stay is not required.  Josem Kaufmann is required: [phone].    Non-emergent EMS transport: $250 copay for each one way medically necessary, Medicare covered trip.  Josem Kaufmann is required: [phone].

## 2015-09-27 NOTE — Consult Note (Signed)
ORTHOPAEDIC CONSULTATION  REQUESTING PHYSICIAN: Max Sane, MD  Chief Complaint:   Low back pain 5 days.  History of Present Illness: [name] [name] is a 82 y.o. female with a history of multiple medical problems, including COPD (requiring home O2 nightly), hypertension, coronary artery disease, atrial fibrillation, breast cancer, stage III kidney disease, and peripheral vascular disease resulting in claudication, who lives at home independently. Apparently about 5 days ago, she developed increased lower back pain and was unable to ambulate without assistance. She was brought to the emergency room by her friend on Saturday where she was evaluated then finally sent home with Tylenol. Because of continued symptoms, she returned to the emergency room on Tuesday and subsequently was admitted for further evaluation and treatment. The patient notes that the pain is primarily in her lower back but will radiate to both buttocks and hip regions, right more severely than left, with some pain radiating into the right thigh. She denies any numbness or paresthesias to either lower extremity, and denies any bowel or bladder complaints. She denies any recent fall or other injury that may have precipitated the onset of these symptoms.  Past Medical History  Diagnosis Date  . HTN (hypertension)   . COPD (chronic obstructive pulmonary disease) (HCC)     a. on home O2 nightly.  . Diverticulosis   . Takotsubo cardiomyopathy 10/2012    a. 10/2012: presented w/ cardiogenic shock/systolic CHF/VDRF - EF [id number] by cath, 40% by f/u echo - cath with mild nonobstructive CAD. b. Not discharged on ACEI due to hypotension.  . Cardiogenic shock ([place])     a. 10/2012 - due to Takotsubo CM.  [name] Respiratory failure ([place])     a. On home O2 nightly. b. VDRF 10/2012 - due to CAP/pulm edema in setting of cardiogenic shock/Takotsubo CM.  [name] CAP (community acquired pneumonia)    a. 10/2012 - will need f/u CXR in June 2014 to ensure clearance.  . Esophageal stricture     a. s/p dilitation 10/2012.  [name] Pleural effusion, right     a. s/p thoracentesis 10/28/12.  . Atrial fibrillation ([place])     a. Dx 10/2012, rate controlled, started on Xarelto.  . Claudication ([place])     a. ABI 10/2012 - R 0.87, L 0.74.  [name] GERD (gastroesophageal reflux disease)   . CKD (chronic kidney disease), stage III   . Breast CA (HCC)     hx  . Abdominal hernia     chronic  . STEMI (ST elevation myocardial infarction) ([place])   . Cellulitis   . CHF (congestive heart failure) ([place])   . CAD (coronary artery disease)     a. 10/2012 - presented w/ STEMI felt due to Takotsubo - mild nonobstructive dz by cath.   Past Surgical History  Procedure Laterality Date  . Mastectomy    . Cataract extraction    . Esophagogastroduodenoscopy N/A 11/05/2012    Procedure: ESOPHAGOGASTRODUODENOSCOPY (EGD) with esophageal savary dilatation;  Surgeon: Missy Sabins, MD;  Location: [facility];  Service: Endoscopy;  Laterality: N/A;  savary dil...no floro needed  . Bladder surgery    . Left heart catheterization with coronary angiogram N/A 10/24/2012    Procedure: LEFT HEART CATHETERIZATION WITH CORONARY ANGIOGRAM;  Surgeon: Sherren Mocha, MD;  Location: [facility] CATH LAB;  Service: Cardiovascular;  Laterality: N/A;  . Peripheral vascular catheterization N/A 02/07/2015    Procedure: Abdominal Aortogram w/Lower Extremity;  Surgeon: Katha Cabal, MD;  Location: [facility] CV LAB;  Service: Cardiovascular;  Laterality: N/A;  . Peripheral vascular catheterization  02/07/2015    Procedure: Lower Extremity Intervention;  Surgeon: Katha Cabal, MD;  Location: [facility] CV LAB;  Service: Cardiovascular;;  . Coronary angioplasty    . Esophagogastroduodenoscopy (egd) with propofol N/A 05/23/2015    Procedure: ESOPHAGOGASTRODUODENOSCOPY (EGD) WITH PROPOFOL;  Surgeon: Lucilla Lame, MD;  Location: [facility] ENDOSCOPY;  Service:  Endoscopy;  Laterality: N/A;   Social History   Social History  . Marital Status: Widowed    Spouse Name: N/A  . Number of Children: 1  . Years of Education: N/A   Social History Main Topics  . Smoking status: Former Smoker -- 1.00 packs/day for 55 years    Types: Cigarettes    Quit date: 06/15/2007  . Smokeless tobacco: Former Systems developer  . Alcohol Use: No  . Drug Use: No  . Sexual Activity: No   Other Topics Concern  . None   Social History Narrative   Family History  Problem Relation Age of Onset  . Heart attack Mother   . Stroke Mother   . COPD Son   . Pneumonia Father    Allergies  Allergen Reactions  . Penicillins Itching, Swelling, Rash and Other (See Comments)    Reaction:  Arm swelling  Has patient had a PCN reaction causing immediate rash, facial/tongue/throat swelling, SOB or lightheadedness with hypotension: No Has patient had a PCN reaction causing severe rash involving mucus membranes or skin necrosis: No Has patient had a PCN reaction that required hospitalization No Has patient had a PCN reaction occurring within the last 10 years: No If all of the above answers are "NO", then may proceed with Cephalosporin use.  . Levofloxacin Other (See Comments)    Reaction:  Unknown   . Sulfa Antibiotics Other (See Comments)    Reaction:  Unknown    Prior to Admission medications   Medication Sig Start Date End Date Taking? Authorizing Provider  acetaminophen (TYLENOL) 650 MG CR tablet Take 1,300 mg by mouth every 8 (eight) hours as needed for pain.   Yes Historical Provider, MD  albuterol (PROVENTIL HFA;VENTOLIN HFA) 108 (90 Base) MCG/ACT inhaler Inhale 1-2 puffs into the lungs every 6 (six) hours as needed for wheezing or shortness of breath. 08/17/15  Yes Arlis Porta., MD  albuterol (PROVENTIL) (2.5 MG/3ML) 0.083% nebulizer solution Take 3 mLs (2.5 mg total) by nebulization every 6 (six) hours as needed for wheezing or shortness of breath. 08/31/15  Yes Arlis Porta., MD  carvedilol (COREG) 6.25 MG tablet Take 6.25 mg by mouth 2 (two) times daily with a meal.   Yes Historical Provider, MD  dextromethorphan-guaiFENesin (MUCINEX DM) 30-600 MG 12hr tablet Take 1 tablet by mouth 2 (two) times daily as needed for cough (and/or chest congestion).   Yes Historical Provider, MD  Fluticasone-Salmeterol (ADVAIR) 250-50 MCG/DOSE AEPB Inhale 1 puff into the lungs 2 (two) times daily.   Yes Historical Provider, MD  furosemide (LASIX) 40 MG tablet Take 40-80 mg by mouth 2 (two) times daily. Pt takes two tablets in the morning and one tablet in the evening.   Yes Historical Provider, MD  loratadine (CLARITIN) 10 MG tablet Take 10 mg by mouth daily.   Yes Historical Provider, MD  Multiple Vitamin (MULTIVITAMIN WITH MINERALS) TABS tablet Take 1 tablet by mouth daily.    Yes Historical Provider, MD  omeprazole (PRILOSEC) 20 MG capsule Take 1 capsule (20 mg total) by mouth daily. 12/21/14  Yes Arlis Porta., MD  potassium chloride (K-DUR) 10 MEQ tablet Take 1 tablet (10 mEq total) by mouth daily. 09/07/15  Yes Arlis Porta., MD  ranitidine (ZANTAC) 150 MG tablet Take 150 mg by mouth at bedtime.   Yes Historical Provider, MD  Rivaroxaban (XARELTO) 15 MG TABS tablet Take 15 mg by mouth every evening.    Yes Historical Provider, MD  tiotropium (SPIRIVA) 18 MCG inhalation capsule Place 18 mcg into inhaler and inhale daily.   Yes Historical Provider, MD  traMADol (ULTRAM) 50 MG tablet Take 50 mg by mouth 3 (three) times daily as needed for moderate pain.   Yes Historical Provider, MD  vitamin B-12 (CYANOCOBALAMIN) 1000 MCG tablet Take 1,000 mcg by mouth daily.   Yes Historical Provider, MD  traMADol (ULTRAM) 50 MG tablet Take 1 tablet (50 mg total) by mouth every 8 (eight) hours as needed. 09/26/15 09/25/16  Joanne Gavel, MD   Dg Lumbar Spine 2-3 Views  09/26/2015  CLINICAL DATA:  Severe low back pain beginning on Sunday. Initial encounter. EXAM: LUMBAR SPINE - 2-3  VIEW COMPARISON:  Chest CT 04/13/2015 which includes the upper lumbar levels FINDINGS: Remote compression fractures of L1, L2, and L3 superior endplate with over 579FGE height loss at L1 and L2 when measured centrally. These compression fractures have a chronic appearance and were also seen in 2016. There is upper lumbar kyphosis from the vertebral body height loss. No acute fracture is seen. The lower thoracic spine is ankylosed with thin syndesmophytes suggesting ankylosing spondylitis. Lumbar spondylosis. Disc calcification at the ankylosed levels. IMPRESSION: 1. No acute finding. 2. Chronic L1, L2, and L3 compression fractures. 3. Spondyloarthropathy with diffuse thoracic ankylosis. Electronically Signed   By: Monte Fantasia M.D.   On: 09/26/2015 15:16   Dg Pelvis 1-2 Views  09/26/2015  CLINICAL DATA:  Acute low back and bilateral lower extremity pain without known injury. EXAM: PELVIS - 1-2 VIEW COMPARISON:  None. FINDINGS: There is no evidence of pelvic fracture or diastasis. No pelvic bone lesions are seen. IMPRESSION: No acute abnormality seen in the pelvis. Electronically Signed   By: Marijo Conception, M.D.   On: 09/26/2015 15:15    Positive ROS: All other systems have been reviewed and were otherwise negative with the exception of those mentioned in the HPI and as above.  Physical Exam: General:  Alert, no acute distress Psychiatric:  Patient is competent for consent with normal mood and affect   Cardiovascular:  No pedal edema Respiratory:  No wheezing, non-labored breathing GI:  Abdomen is soft and non-tender Skin:  No lesions in the area of chief complaint Neurologic:  Sensation intact distally Lymphatic:  No axillary or cervical lymphadenopathy  Orthopedic Exam:  Orthopedic examination is limited to the lumbar spine and lower extremities. Skin inspection of her lumbar spine is unremarkable. In a sitting position, there is evidence of a degenerative kyphosis with loss of lumbar lordosis,  but no obvious scoliosis. She has mild tenderness to percussion along the upper and mid lumbar spine more so than over the lumbosacral junction. No attempt was made to observe her ambulating or to ask her to stand up. She is able to actively dorsiflex and plantarflex her toes, ankles, and knees bilaterally. She exhibits 4-4+/5 strength with resisted ankle dorsiflexion, plantarflexion, inversion, and eversion bilaterally. She also has 4-4+/5 strength with EHL testing bilaterally. Sensation is intact to light touch in all distributions in both lower extremities and feet. She has  good capillary refill to both feet.  X-rays:  Recent lumbar spine films are available for review. These films demonstrate moderate multilevel degenerative disc disease with apparently old L1, L2, and L3 compression fractures. There is no evidence for spondylolysis or spondylolisthesis. No lytic lesions or obvious acute fractures are identified.  Assessment: Intractable low back pain radiating to both buttocks, right greater than left with underlying degenerative disc disease and old L1-L3 compression fractures.  Plan: The treatment options are discussed with the patient. Most likely her symptoms are due to lumbar spinal stenosis versus a new compression or insufficiency fracture. The patient understands the importance of being able to try to get an MRI scan of the lower back in order to differentiate between the two diagnoses. However, apparently the MRI was attempted twice today, but the patient could not tolerate lying still for that period of time, so the MRI was canceled each time. If we are unable to obtain an MRI scan, perhaps a CT scan might be of benefit to help differentiate between a new acute compression/insufficiency fracture versus progressive stenosis of the lower lumbar spine. If she does have stenosis, she might benefit from a lumbar epidural steroid injection where as if it appears that she has a new compression  fracture of her lumbar spine, she might benefit from a kyphoplasty by Dr. Rudene Christians, my partner.  At this point, until we can obtain one of the above mentioned studies to try to differentiate between the two most likely causes of her symptoms, she is to continue to receive pain medications as needed for comfort. She may be mobilized gradually as symptoms permit. In addition, a lumbosacral corset might be of benefit, especially when she is sitting up or out of bed. This could be worn while she is in a CT scanner as well.  Thank you for asking me to participate in the care of this most pleasant unfortunate woman. I will be happy to follow her with you.   Pascal Lux, MD  Beeper #:  [phone]  09/27/2015 4:32 PM

## 2015-09-27 NOTE — Care Management (Signed)
Patient was here in March 2017 and discharged to home with [facility]. RNCM will continue to follow. PT pending.

## 2015-09-27 NOTE — Clinical Social Work Note (Signed)
Clinical Social Work Assessment  Patient Details  Name: [name] [name] MRN: [id number] Date of Birth: Mar 18, 1923  Date of referral:  09/27/15               Reason for consult:  Facility Placement                Permission sought to share information with:  Chartered certified accountant granted to share information::  Yes, Verbal Permission Granted  Name::      [place]::   [place]   Relationship::     Contact Information:     Housing/Transportation Living arrangements for the past 2 months:  [place], [place] of Information:  Patient Patient Interpreter Needed:  None Criminal Activity/Legal Involvement Pertinent to Current Situation/Hospitalization:  No - Comment as needed Significant Relationships:  Other Family Members Lives with:  Self Do you feel safe going back to the place where you live?  Yes Need for family participation in patient care:  No (Coment)  Care giving concerns:  Patient lives alone in [place].    Social Worker assessment / plan:  Holiday representative (CSW) received SNF consult. PT is pending. Patient could not get an MRI today because she could not lay down. CSW met with patient alone at bedside. Patient was alert and oriented and was sitting up in the bed. CSW introduced self and explained role of CSW department. Patient reported that she lives alone in [place] and her daughter in law [name] [name] is her primary contact. Patient reported that she was recently at SNF this year in 2017. Patient reported that she been at [facility]. Patient requested to go to SNF. CSW explained that if patient does not have any SNF days left she will have to pay privately or go home with home health. Per patient she cannot pay privately.   FL2 complete and faxed out. PT is pending. CSW will continue to follow and assist as needed.    Employment status:  Disabled (Comment on  whether or not currently receiving Disability), Retired Nurse, adult PT Recommendations:  Not assessed at this time Information / Referral to community resources:  [place]  Patient/Family's Response to care:  Patient requested to go to SNF.   Patient/Family's Understanding of and Emotional Response to Diagnosis, Current Treatment, and Prognosis:  Patient was pleasant and thanked CSW for visit.   Emotional Assessment Appearance:  Appears stated age Attitude/Demeanor/Rapport:    Affect (typically observed):  Accepting, Adaptable, Pleasant Orientation:  Oriented to Self, Oriented to Place, Oriented to  Time, Oriented to Situation Alcohol / Substance use:  Not Applicable Psych involvement (Current and /or in the community):  No (Comment)  Discharge Needs  Concerns to be addressed:  Discharge Planning Concerns Readmission within the last 30 days:  No Current discharge risk:  Dependent with Mobility Barriers to Discharge:  Continued Medical Work up   Elwyn Reach 09/27/2015, 4:31 PM

## 2015-09-28 ENCOUNTER — Observation Stay: Payer: Medicare Other

## 2015-09-28 DIAGNOSIS — M5137 Other intervertebral disc degeneration, lumbosacral region: Secondary | ICD-10-CM | POA: Diagnosis not present

## 2015-09-28 DIAGNOSIS — J449 Chronic obstructive pulmonary disease, unspecified: Secondary | ICD-10-CM | POA: Diagnosis not present

## 2015-09-28 DIAGNOSIS — N183 Chronic kidney disease, stage 3 (moderate): Secondary | ICD-10-CM | POA: Diagnosis not present

## 2015-09-28 DIAGNOSIS — E876 Hypokalemia: Secondary | ICD-10-CM | POA: Diagnosis not present

## 2015-09-28 DIAGNOSIS — M545 Low back pain: Secondary | ICD-10-CM | POA: Diagnosis not present

## 2015-09-28 LAB — CBC
HEMATOCRIT: 32.8 % — AB (ref 35.0–47.0)
HEMOGLOBIN: 10.8 g/dL — AB (ref 12.0–16.0)
MCH: 30.8 pg (ref 26.0–34.0)
MCHC: 33 g/dL (ref 32.0–36.0)
MCV: 93.2 fL (ref 80.0–100.0)
Platelets: 183 10*3/uL (ref 150–440)
RBC: 3.52 MIL/uL — AB (ref 3.80–5.20)
RDW: 14.2 % (ref 11.5–14.5)
WBC: 8 10*3/uL (ref 3.6–11.0)

## 2015-09-28 LAB — BASIC METABOLIC PANEL
ANION GAP: 5 (ref 5–15)
BUN: 20 mg/dL (ref 6–20)
CHLORIDE: 95 mmol/L — AB (ref 101–111)
CO2: 41 mmol/L — AB (ref 22–32)
Calcium: 8.8 mg/dL — ABNORMAL LOW (ref 8.9–10.3)
Creatinine, Ser: 0.97 mg/dL (ref 0.44–1.00)
GFR calc non Af Amer: 50 mL/min — ABNORMAL LOW (ref >60)
GFR, EST AFRICAN AMERICAN: 57 mL/min — AB (ref >60)
GLUCOSE: 135 mg/dL — AB (ref 65–99)
POTASSIUM: 3.4 mmol/L — AB (ref 3.5–5.1)
Sodium: 141 mmol/L (ref 135–145)

## 2015-09-28 LAB — GLUCOSE, CAPILLARY: Glucose-Capillary: 132 mg/dL — ABNORMAL HIGH (ref 65–99)

## 2015-09-28 MED ORDER — OXYCODONE HCL 5 MG PO TABS
10.0000 mg | ORAL_TABLET | ORAL | Status: DC | PRN
  Administered 2015-09-28 – 2015-09-29 (×3): 10 mg via ORAL
  Filled 2015-09-28 (×3): qty 2

## 2015-09-28 MED ORDER — LIDOCAINE 5 % EX PTCH
1.0000 | MEDICATED_PATCH | CUTANEOUS | Status: DC
Start: 2015-09-28 — End: 2015-09-29
  Administered 2015-09-28 – 2015-09-29 (×2): 1 via TRANSDERMAL
  Filled 2015-09-28 (×2): qty 1

## 2015-09-28 NOTE — Progress Notes (Signed)
O2 reduced to l lpm. Sat 97%  (Pt wears 3 L at home)

## 2015-09-28 NOTE — Clinical Social Work Placement (Signed)
   CLINICAL SOCIAL WORK PLACEMENT  NOTE  Date:  09/28/2015  Patient Details  Name: [name] [name] MRN: [id number] Date of Birth: [birth year]  Clinical Social Work is seeking post-discharge placement for this patient at the [place] level of care (*CSW will initial, date and re-position this form in  chart as items are completed):  Yes   Patient/family provided with [facility] Work Department's list of facilities offering this level of care within the geographic area requested by the patient (or if unable, by the patient's family).  Yes   Patient/family informed of their freedom to choose among providers that offer the needed level of care, that participate in Medicare, Medicaid or managed care program needed by the patient, have an available bed and are willing to accept the patient.  Yes   Patient/family informed of [facility]'s ownership interest in [facility] and [facility], as well as of the fact that they are under no obligation to receive care at these facilities.  PASRR submitted to EDS on 09/27/15     PASRR number received on 09/27/15     Existing PASRR number confirmed on       FL2 transmitted to all facilities in geographic area requested by pt/family on 09/27/15     FL2 transmitted to all facilities within larger geographic area on       Patient informed that his/her managed care company has contracts with or will negotiate with certain facilities, including the following:        Yes   Patient/family informed of bed offers received.  Patient chooses bed at [facility]     Physician recommends and patient chooses bed at  [facility])    Patient to be transferred to   on  .  Patient to be transferred to facility by       Patient family notified on   of transfer.  Name of family member notified:        PHYSICIAN Please sign FL2     Additional Comment:    _______________________________________________ Darden Dates, LCSW 09/28/2015, 4:22 PM

## 2015-09-28 NOTE — Progress Notes (Signed)
Subjective: No new complaints. Patient sitting up in chair next to her bed and appears comfortable.   Objective: Vital signs in last 24 hours: Temp:  [97.5 F (36.4 C)-98 F (36.7 C)] 98 F (36.7 C) (04/20 1456) Pulse Rate:  [55-70] 55 (04/20 1456) Resp:  [16-20] 18 (04/20 1456) BP: (109-140)/(45-63) 109/63 mmHg (04/20 1456) SpO2:  [97 %-100 %] 100 % (04/20 1456) Weight:  [76.159 kg (167 lb 14.4 oz)] 76.159 kg (167 lb 14.4 oz) (04/20 0500)  Intake/Output from previous day: 04/19 0701 - 04/20 0700 In: 1226.3 [P.O.:360; I.V.:866.3] Out: 1500 [Urine:1500] Intake/Output this shift: Total I/O In: 1735 [P.O.:480; I.V.:1255] Out: 1150 [Urine:1150]   Recent Labs  09/26/15 1415 09/27/15 0538 09/28/15 0637  HGB 11.5* 11.6* 10.8*    Recent Labs  09/27/15 0538 09/28/15 0637  WBC 4.2 8.0  RBC 3.63* 3.52*  HCT 34.1* 32.8*  PLT 182 183    Recent Labs  09/27/15 0538 09/28/15 0637  NA 136 141  K 3.2* 3.4*  CL 91* 95*  CO2 38* 41*  BUN 20 20  CREATININE 1.03* 0.97  GLUCOSE 211* 135*  CALCIUM 8.5* 8.8*    Recent Labs  09/26/15 1415  INR 1.48    Physical Exam: Examination unchanged versus yesterday.  Assessment: Acute onset low back pain of unclear etiology.  Plan: The patient appears comfortable at present, but it would still be nice to try to identify the source of her pain. Therefore, when she is able medically to tolerate an MRI or CT scan of the lumbar spine, then this should be obtained. Meanwhile, continue her present medication regimen and continue to mobilize her with physical therapy as symptoms permit.   [name] [name] 09/28/2015, 5:15 PM

## 2015-09-28 NOTE — Clinical Social Work Note (Signed)
CSW provided bed offers. Pt chose [facility], however she is going to think about it as she is in her copay days. Pt is unsure if she is able to pay the daily copay due to not having a supplement. CSW will update facility on bed choice. CSW will continue to follow.   Darden Dates, MSW, LCSW  Clinical Socail [place]  [phone]

## 2015-09-28 NOTE — Progress Notes (Signed)
[facility] NAME: [name] [name]    MR#:  [id number]  DATE OF BIRTH:  03-07-1923  SUBJECTIVE:  CHIEF COMPLAINT:   Chief Complaint  Patient presents with  . Hip Pain   patient complains of lumbar pain radiating to the leg, mostly on the right side, although able to sit in the chair, refuses to go to MRI, CT scan of the lumbar spine was ordered, not performed.[name] Physical therapist recommended skilled nursing facility placement REVIEW OF SYSTEMS:  Review of Systems  Constitutional: Negative for fever, weight loss, malaise/fatigue and diaphoresis.  HENT: Negative for ear discharge, ear pain, hearing loss, nosebleeds, sore throat and tinnitus.   Eyes: Negative for blurred vision and pain.  Respiratory: Negative for cough, hemoptysis, shortness of breath and wheezing.   Cardiovascular: Negative for chest pain, palpitations, orthopnea and leg swelling.  Gastrointestinal: Negative for heartburn, nausea, vomiting, abdominal pain, diarrhea, constipation and blood in stool.  Genitourinary: Negative for dysuria, urgency and frequency.  Musculoskeletal: Positive for back pain. Negative for myalgias.  Skin: Negative for itching and rash.  Neurological: Negative for dizziness, tingling, tremors, focal weakness, seizures, weakness and headaches.  Psychiatric/Behavioral: Negative for depression. The patient is not nervous/anxious.     DRUG ALLERGIES:   Allergies  Allergen Reactions  . Penicillins Itching, Swelling, Rash and Other (See Comments)    Reaction:  Arm swelling  Has patient had a PCN reaction causing immediate rash, facial/tongue/throat swelling, SOB or lightheadedness with hypotension: No Has patient had a PCN reaction causing severe rash involving mucus membranes or skin necrosis: No Has patient had a PCN reaction that required hospitalization No Has patient had a PCN reaction occurring within the last 10 years: No If all of the  above answers are "NO", then may proceed with Cephalosporin use.  . Levofloxacin Other (See Comments)    Reaction:  Unknown   . Sulfa Antibiotics Other (See Comments)    Reaction:  Unknown    VITALS:  Blood pressure 124/61, pulse 65, temperature 97.7 F (36.5 C), temperature source Oral, resp. rate 16, height 5\' 7"  (1.702 m), weight 76.159 kg (167 lb 14.4 oz), SpO2 100 %. PHYSICAL EXAMINATION:  Physical Exam  Constitutional: She is oriented to person, place, and time and well-developed, well-nourished, and in no distress.  HENT:  Head: Normocephalic and atraumatic.  Eyes: Conjunctivae and EOM are normal. Pupils are equal, round, and reactive to light.  Neck: Normal range of motion. Neck supple. No tracheal deviation present. No thyromegaly present.  Cardiovascular: Normal rate, regular rhythm and normal heart sounds.   Pulmonary/Chest: Effort normal and breath sounds normal. No respiratory distress. She has no wheezes. She exhibits no tenderness.  Abdominal: Soft. Bowel sounds are normal. She exhibits no distension. There is no tenderness.  Musculoskeletal:       Lumbar back: She exhibits decreased range of motion, tenderness and spasm.  Positive straight leg raise in the right and left leg at 35-45  Neurological: She is alert and oriented to person, place, and time. No cranial nerve deficit.  Skin: Skin is warm and dry. No rash noted.  Psychiatric: Mood and affect normal.   LABORATORY PANEL:   CBC  Recent Labs Lab 09/28/15 0637  WBC 8.0  HGB 10.8*  HCT 32.8*  PLT 183   ------------------------------------------------------------------------------------------------------------------ Chemistries   Recent Labs Lab 09/27/15 0538 09/28/15 0637  NA 136 141  K 3.2* 3.4*  CL 91* 95*  CO2 38* 41*  GLUCOSE 211* 135*  BUN 20 20  CREATININE 1.03* 0.97  CALCIUM 8.5* 8.8*  MG 1.9  --    RADIOLOGY:  No results found. ASSESSMENT AND PLAN:  82 y.o. female with COPD with  chronic home oxygen requirement, hypertension, chronic kidney disease, CHF, nonobstructive coronary artery disease who presents for evaluation of 4 days of bilateral hip pain radiating down the legs/thighs, gradual onset, constant since onset, moderate to severe and worse with movement  * Lumbar back pain, with sciatica, refuses to perform MRI due to pain/claustrophobia, CT scan of lumbar spine is ordered, pending. Patient was seen by physical therapist, skilled nursing facility placement was recommended - Continue Prednisone 50 mg PO daily - X-ray is negative - pain management - on Oxycodone, Dilaudid,  Flexeril, unclear if any improvement, but was able to participate in physical therapist evaluation, SNF placement for rehabilitation was recommended -  MRI of the lumbar spine was  not completed as patient has lots of pain. - Care management and social worker consult for possible placement and/or home health needs - pt prefers [facility]  * Hypokalemia - Repleting and recheck - Therapeutic Mg level  * COPD - On 3 L oxygen by nasal cannula chronically at home, on 2 L here in the hospital, and oxygen saturation is 100% - Continue home inhalers, no exacerbation  * History of CHF - Not having any exacerbation - Continue home dose of Lasix and potassium  * CKD stage II - at Baseline    All the records are reviewed and case discussed with Care Management/Social Worker. Management plans discussed with the patient and she is in agreement.  CODE STATUS: FULL CODE  TOTAL TIME TAKING CARE OF THIS PATIENT: 35 minutes.   More than 50% of the time was spent in counseling/coordination of care: YES  POSSIBLE D/C IN 1-2 DAYS, DEPENDING ON CLINICAL CONDITION. And MRI, Claudie Revering M.D on 09/28/2015 at 2:32 PM  Between 7am to 6pm - Pager - [phone]  After 6pm go to [email or web address] - password [email or web address] [facility]  Office  [phone]  CC: Primary care physician;  [name], MD  Note: This dictation was prepared with Dragon dictation along with smaller phrase technology. Any transcriptional errors that result from this process are unintentional.

## 2015-09-28 NOTE — Evaluation (Signed)
Physical Therapy Evaluation Patient Details Name: [name] [name] MRN: [id number] DOB: 1922/08/16 Today's Date: 09/28/2015   History of Present Illness  [name] [name] is a 82 y.o. female with a history of multiple medical problems, including COPD (requiring home O2 nightly), hypertension, coronary artery disease, atrial fibrillation, breast cancer, stage III kidney disease, and peripheral vascular disease resulting in claudication, who lives at home independently. Apparently about 5 days ago, she developed increased lower back pain and was unable to ambulate without assistance. She was brought to the emergency room by her friend on Saturday where she was evaluated then finally sent home with Tylenol. Because of continued symptoms, she returned to the emergency room on Tuesday and subsequently was admitted for further evaluation and treatment. The patient notes that the pain is primarily in her lower back but will radiate to both buttocks and hip regions, right more severely than left, with some pain radiating into the right thigh. She denies any numbness or paresthesias to either lower extremity, and denies any bowel or bladder complaints. She denies any recent fall or other injury that may have precipitated the onset of these symptoms. No reported falls in the last 12 months  Clinical Impression  Pt reports severe RLE pain with all attempts at transfers and ambulation. She is unable to tolerate MMT of either LE with attempts due to bilateral LE pain. Pt reports painful palpation to bilateral LE. Pt requires assist for transfers and attempted ambulation. She is only able to take a few small steps forward with minimal weight acceptance to RLE due to pain. During first attempt RLE buckles and pt requires dependent assist back onto recliner. Pt is clearly unable to function independently in current condition and will need SNF placement at discharge. She is in agreement with recommendation. CSW notified. Pt will  benefit from skilled PT services to address deficits in strength, balance, and mobility in order to return to full function at home.     Follow Up Recommendations SNF    Equipment Recommendations  Rolling walker with 5" wheels;Other (comment) ([facility] arranged [facility])    Recommendations for Other Services       Precautions / Restrictions Precautions Precautions: Fall Restrictions Weight Bearing Restrictions: Yes      Mobility  Bed Mobility               General bed mobility comments: Received upright in recliner and left in recliner  Transfers Overall transfer level: Needs assistance Equipment used: Rolling walker (2 wheeled) Transfers: Sit to/from Stand Sit to Stand: Min assist;+2 physical assistance         General transfer comment: Pt with decreased weight shifting to RLE. Cues for safe hand placement. Poor anterior weight shifting  Ambulation/Gait Ambulation/Gait assistance: Mod assist;+2 physical assistance Ambulation Distance (Feet): 2 Feet Assistive device: Rolling walker (2 wheeled) Gait Pattern/deviations: Step-to pattern Gait velocity: Decreased Gait velocity interpretation: <1.8 ft/sec, indicative of risk for recurrent falls General Gait Details: Pt takes 1-2 small steps with minimal weight acceptance to RLE due to pain. During first attempt RLE buckles and pt requires dependent assist back to recliner. During second attempt she improves but is still only able to take 2 small forward steps due to increase in RLE pain.   Stairs            Wheelchair Mobility    Modified Rankin (Stroke Patients Only)       Balance Overall balance assessment: Needs assistance Sitting-balance support: No upper extremity  supported Sitting balance-Leahy Scale: Fair     Standing balance support: Bilateral upper extremity supported Standing balance-Leahy Scale: Poor                               Pertinent Vitals/Pain Pain Assessment: 0-10 Pain  Score: 8  Pain Location: R hip and thigh Pain Descriptors / Indicators: Sharp Pain Intervention(s): Limited activity within patient's tolerance;Monitored during session;Premedicated before session    Home Living Family/patient expects to be discharged to:: Private residence Living Arrangements: Alone Available Help at Discharge: Friend(s);Available PRN/intermittently;Family Type of Home: House Home Access: Level entry     Home Layout: One level Home Equipment: Cane - single point;Walker - 4 wheels (no 2  wheeled walker, no BSC, no grab bars)      Prior Function Level of Independence: Independent         Comments: Pt was indep with household chores. Independent with ADLs. Assist required for transportation and some IADLs     Hand Dominance   Dominant Hand: Right    Extremity/Trunk Assessment   Upper Extremity Assessment: RUE deficits/detail RUE Deficits / Details: Elbow flexion/extension and wrist/grip strength grossly 4 to 4+/5. Bilateral shoulder flexion is 4-/5 due to reported bilateral pain. Pt states that when "this whole thing started my shoulders were hurting."         Lower Extremity Assessment: RLE deficits/detail RLE Deficits / Details: Pt unable to tolerate MMT of LE due to increase in pain       Communication   Communication: No difficulties  Cognition Arousal/Alertness: Awake/alert Behavior During Therapy: WFL for tasks assessed/performed Overall Cognitive Status: Within Functional Limits for tasks assessed                      General Comments      Exercises        Assessment/Plan    PT Assessment Patient needs continued PT services  PT Diagnosis Difficulty walking;Abnormality of gait;Generalized weakness;Acute pain   PT Problem List Decreased strength;Decreased activity tolerance;Decreased balance;Decreased mobility;Decreased knowledge of use of DME;Pain  PT Treatment Interventions DME instruction;Gait training;Stair  training;Therapeutic activities;Therapeutic exercise;Balance training;Neuromuscular re-education;Patient/family education   PT Goals (Current goals can be found in the Care Plan section) Acute Rehab PT Goals Patient Stated Goal: Return to prior level of function at home PT Goal Formulation: With patient Time For Goal Achievement: 10/12/15 Potential to Achieve Goals: Fair    Frequency Min 2X/week   Barriers to discharge Decreased caregiver support Lives alone    Co-evaluation               End of Session Equipment Utilized During Treatment: Gait belt;Oxygen Activity Tolerance: Patient limited by pain Patient left: in chair;with call bell/phone within reach;with chair alarm set Nurse Communication: Mobility status;Other (comment) (Communicated to CSW)    Functional Assessment Tool Used: clinical judgement Functional Limitation: Mobility: Walking and moving around Mobility: Walking and Moving Around Current Status [phone]): At least 80 percent but less than 100 percent impaired, limited or restricted Mobility: Walking and Moving Around Goal Status [phone]): At least 1 percent but less than 20 percent impaired, limited or restricted    Time: 1032-1056 PT Time Calculation (min) (ACUTE ONLY): 24 min   Charges:   PT Evaluation $PT Eval Moderate Complexity: 1 Procedure     PT G Codes:   PT G-Codes **NOT FOR INPATIENT CLASS** Functional Assessment Tool Used: clinical judgement Functional Limitation: Mobility:  Walking and moving around Mobility: Walking and Moving Around Current Status [phone]): At least 80 percent but less than 100 percent impaired, limited or restricted Mobility: Walking and Moving Around Goal Status [phone]): At least 1 percent but less than 20 percent impaired, limited or restricted   Phillips Grout PT, DPT   Huprich,Jason 09/28/2015, 12:05 PM

## 2015-09-29 DIAGNOSIS — G8911 Acute pain due to trauma: Secondary | ICD-10-CM | POA: Diagnosis not present

## 2015-09-29 DIAGNOSIS — K5900 Constipation, unspecified: Secondary | ICD-10-CM | POA: Diagnosis not present

## 2015-09-29 DIAGNOSIS — I4891 Unspecified atrial fibrillation: Secondary | ICD-10-CM | POA: Diagnosis not present

## 2015-09-29 DIAGNOSIS — N183 Chronic kidney disease, stage 3 unspecified: Secondary | ICD-10-CM

## 2015-09-29 DIAGNOSIS — N186 End stage renal disease: Secondary | ICD-10-CM | POA: Diagnosis not present

## 2015-09-29 DIAGNOSIS — K5732 Diverticulitis of large intestine without perforation or abscess without bleeding: Secondary | ICD-10-CM | POA: Diagnosis not present

## 2015-09-29 DIAGNOSIS — I130 Hypertensive heart and chronic kidney disease with heart failure and stage 1 through stage 4 chronic kidney disease, or unspecified chronic kidney disease: Secondary | ICD-10-CM | POA: Diagnosis not present

## 2015-09-29 DIAGNOSIS — M4856XS Collapsed vertebra, not elsewhere classified, lumbar region, sequela of fracture: Secondary | ICD-10-CM | POA: Diagnosis not present

## 2015-09-29 DIAGNOSIS — J449 Chronic obstructive pulmonary disease, unspecified: Secondary | ICD-10-CM | POA: Insufficient documentation

## 2015-09-29 DIAGNOSIS — Z853 Personal history of malignant neoplasm of breast: Secondary | ICD-10-CM | POA: Diagnosis not present

## 2015-09-29 DIAGNOSIS — S32009A Unspecified fracture of unspecified lumbar vertebra, initial encounter for closed fracture: Secondary | ICD-10-CM | POA: Diagnosis not present

## 2015-09-29 DIAGNOSIS — Z5189 Encounter for other specified aftercare: Secondary | ICD-10-CM | POA: Diagnosis not present

## 2015-09-29 DIAGNOSIS — I509 Heart failure, unspecified: Secondary | ICD-10-CM | POA: Diagnosis not present

## 2015-09-29 DIAGNOSIS — I10 Essential (primary) hypertension: Secondary | ICD-10-CM | POA: Diagnosis not present

## 2015-09-29 DIAGNOSIS — M6281 Muscle weakness (generalized): Secondary | ICD-10-CM | POA: Diagnosis not present

## 2015-09-29 DIAGNOSIS — J90 Pleural effusion, not elsewhere classified: Secondary | ICD-10-CM | POA: Diagnosis not present

## 2015-09-29 DIAGNOSIS — I2510 Atherosclerotic heart disease of native coronary artery without angina pectoris: Secondary | ICD-10-CM | POA: Diagnosis not present

## 2015-09-29 DIAGNOSIS — J441 Chronic obstructive pulmonary disease with (acute) exacerbation: Secondary | ICD-10-CM | POA: Diagnosis not present

## 2015-09-29 DIAGNOSIS — R1310 Dysphagia, unspecified: Secondary | ICD-10-CM | POA: Diagnosis not present

## 2015-09-29 DIAGNOSIS — D649 Anemia, unspecified: Secondary | ICD-10-CM | POA: Diagnosis not present

## 2015-09-29 DIAGNOSIS — K449 Diaphragmatic hernia without obstruction or gangrene: Secondary | ICD-10-CM | POA: Diagnosis not present

## 2015-09-29 DIAGNOSIS — M79604 Pain in right leg: Secondary | ICD-10-CM | POA: Diagnosis not present

## 2015-09-29 DIAGNOSIS — M5117 Intervertebral disc disorders with radiculopathy, lumbosacral region: Secondary | ICD-10-CM | POA: Diagnosis not present

## 2015-09-29 DIAGNOSIS — R531 Weakness: Secondary | ICD-10-CM | POA: Diagnosis not present

## 2015-09-29 DIAGNOSIS — R0602 Shortness of breath: Secondary | ICD-10-CM | POA: Diagnosis not present

## 2015-09-29 DIAGNOSIS — S32000A Wedge compression fracture of unspecified lumbar vertebra, initial encounter for closed fracture: Secondary | ICD-10-CM

## 2015-09-29 DIAGNOSIS — I739 Peripheral vascular disease, unspecified: Secondary | ICD-10-CM | POA: Diagnosis not present

## 2015-09-29 DIAGNOSIS — M5137 Other intervertebral disc degeneration, lumbosacral region: Secondary | ICD-10-CM | POA: Diagnosis not present

## 2015-09-29 DIAGNOSIS — M25552 Pain in left hip: Secondary | ICD-10-CM | POA: Diagnosis not present

## 2015-09-29 DIAGNOSIS — I252 Old myocardial infarction: Secondary | ICD-10-CM | POA: Diagnosis not present

## 2015-09-29 DIAGNOSIS — E876 Hypokalemia: Secondary | ICD-10-CM | POA: Diagnosis not present

## 2015-09-29 DIAGNOSIS — Z87891 Personal history of nicotine dependence: Secondary | ICD-10-CM | POA: Diagnosis not present

## 2015-09-29 DIAGNOSIS — I5023 Acute on chronic systolic (congestive) heart failure: Secondary | ICD-10-CM | POA: Diagnosis not present

## 2015-09-29 DIAGNOSIS — K219 Gastro-esophageal reflux disease without esophagitis: Secondary | ICD-10-CM | POA: Diagnosis not present

## 2015-09-29 DIAGNOSIS — M545 Low back pain: Secondary | ICD-10-CM | POA: Diagnosis not present

## 2015-09-29 DIAGNOSIS — Z9981 Dependence on supplemental oxygen: Secondary | ICD-10-CM | POA: Diagnosis not present

## 2015-09-29 DIAGNOSIS — J9610 Chronic respiratory failure, unspecified whether with hypoxia or hypercapnia: Secondary | ICD-10-CM | POA: Diagnosis not present

## 2015-09-29 DIAGNOSIS — M25551 Pain in right hip: Secondary | ICD-10-CM | POA: Diagnosis not present

## 2015-09-29 LAB — GLUCOSE, CAPILLARY: GLUCOSE-CAPILLARY: 140 mg/dL — AB (ref 65–99)

## 2015-09-29 MED ORDER — OXYCODONE HCL 5 MG PO TABS: 5.0000 mg | ORAL_TABLET | Freq: Four times a day (QID) | ORAL | Status: DC

## 2015-09-29 MED ORDER — OXYCODONE HCL 10 MG PO TABS: 10.0000 mg | ORAL_TABLET | ORAL | Status: DC | PRN

## 2015-09-29 MED ORDER — OXYCODONE HCL 5 MG PO TABS: 5.0000 mg | ORAL_TABLET | Freq: Four times a day (QID) | ORAL | Status: AC

## 2015-09-29 MED ORDER — LIDOCAINE 5 % EX PTCH: 1.0000 | MEDICATED_PATCH | CUTANEOUS | Status: AC

## 2015-09-29 MED ORDER — CYCLOBENZAPRINE HCL 5 MG PO TABS: 5.0000 mg | ORAL_TABLET | Freq: Three times a day (TID) | ORAL | Status: DC

## 2015-09-29 MED ORDER — PREDNISONE 10 MG (21) PO TBPK: 10.0000 mg | ORAL_TABLET | Freq: Every day | ORAL | Status: AC

## 2015-09-29 MED ORDER — POLYETHYLENE GLYCOL 3350 17 G PO PACK: 17.0000 g | PACK | Freq: Every day | ORAL | Status: AC

## 2015-09-29 MED ORDER — OXYCODONE HCL 10 MG PO TABS: 10.0000 mg | ORAL_TABLET | ORAL | Status: AC | PRN

## 2015-09-29 MED ORDER — SENNA 8.6 MG PO TABS: 2.0000 | ORAL_TABLET | Freq: Two times a day (BID) | ORAL | Status: AC

## 2015-09-29 MED ORDER — CYCLOBENZAPRINE HCL 5 MG PO TABS: 5.0000 mg | ORAL_TABLET | Freq: Three times a day (TID) | ORAL | Status: AC

## 2015-09-29 MED ORDER — OXYCODONE HCL 5 MG PO TABS
5.0000 mg | ORAL_TABLET | Freq: Four times a day (QID) | ORAL | Status: DC
  Administered 2015-09-29: 5 mg via ORAL
  Filled 2015-09-29: qty 1

## 2015-09-29 NOTE — Progress Notes (Signed)
Patient is discharge to SNF in a stable condition , report given to floor nurse , pick up by ems

## 2015-09-29 NOTE — Progress Notes (Signed)
Spoke with Vernard Gambles, [facility] rep at [phone], to notify of non-emergent EMS transport.  Auth notification reference given as [id number].   Service date range good from 09/29/15 - 12/28/15.   Gap exception requested to determine if services can be considered at an in-network level.

## 2015-09-29 NOTE — Clinical Social Work Note (Signed)
Pt is ready for discharge today and will go [facility]. Pt is aware and agreeable to discharge plan. CSW left a message for daughter in law Dixie. Facility is ready to admit pt as they have received discharge summary. RN to call report and EMS will provide transportation. CSW is signing off as no further needs identified.   Darden Dates, MSW, LCSW  Clinical Social Worker  [phone]

## 2015-09-29 NOTE — Discharge Summary (Addendum)
[facility] [place] NAME: [name] [name]    MR#:  [id number]  DATE OF BIRTH:  03-25-1923  DATE OF ADMISSION:  09/26/2015 ADMITTING PHYSICIAN: Max Sane, MD  DATE OF DISCHARGE: No discharge date for patient encounter.  PRIMARY CARE PHYSICIAN: [name], MD     ADMISSION DIAGNOSIS:  Low back pain [M54.5] Bilateral low back pain with sciatica, sciatica laterality unspecified [M54.41, M54.42]  DISCHARGE DIAGNOSIS:  Principal Problem:   Back pain Active Problems:   Lumbar compression fracture (HCC)   Generalized weakness   CKD (chronic kidney disease), stage III   COPD (chronic obstructive pulmonary disease) ([place])   SECONDARY DIAGNOSIS:   Past Medical History  Diagnosis Date  . HTN (hypertension)   . COPD (chronic obstructive pulmonary disease) (HCC)     a. on home O2 nightly.  . Diverticulosis   . Takotsubo cardiomyopathy 10/2012    a. 10/2012: presented w/ cardiogenic shock/systolic CHF/VDRF - EF [id number] by cath, 40% by f/u echo - cath with mild nonobstructive CAD. b. Not discharged on ACEI due to hypotension.  . Cardiogenic shock ([place])     a. 10/2012 - due to Takotsubo CM.  [name] Respiratory failure ([place])     a. On home O2 nightly. b. VDRF 10/2012 - due to CAP/pulm edema in setting of cardiogenic shock/Takotsubo CM.  [name] CAP (community acquired pneumonia)     a. 10/2012 - will need f/u CXR in June 2014 to ensure clearance.  . Esophageal stricture     a. s/p dilitation 10/2012.  [name] Pleural effusion, right     a. s/p thoracentesis 10/28/12.  . Atrial fibrillation ([place])     a. Dx 10/2012, rate controlled, started on Xarelto.  . Claudication ([place])     a. ABI 10/2012 - R 0.87, L 0.74.  [name] GERD (gastroesophageal reflux disease)   . CKD (chronic kidney disease), stage III   . Breast CA (HCC)     hx  . Abdominal hernia     chronic  . STEMI (ST elevation myocardial infarction) ([place])   . Cellulitis   . CHF (congestive heart failure)  ([place])   . CAD (coronary artery disease)     a. 10/2012 - presented w/ STEMI felt due to Takotsubo - mild nonobstructive dz by cath.    .pro HOSPITAL COURSE:  The patient is a 82 year old Caucasian female with past medical history significant for history of COPD, chronic respiratory failure, on 3 L of oxygen through nasal cannula at home, hypertension, CAD, CHF, coronary artery disease, who presents to the hospital with complaints of 40. History of bilateral hip pain radiating down to the legs/thighs, worsening with movements. Patient was seen in the emergency room on 09/23/2015 for similar complaints, she had plain films of right and left hip which were negative for acute fracture and was discharged home, since that time, her pain has worsened, patient denied any falls or injury, she had no numbness or weakness, bowel or bladder incontinence, however, complained of inability to be a weight on her feet and ambulate. On arrival to the hospital patient was noted to have mild renal insufficiency with creatinine of 1.03, hypokalemia, mild anemia, hyperglycemia. Pelvic x-ray revealed no acute abnormality, lumbar spine x-ray showed no acute findings, chronic L1, L2 and L3 compression fractures, spondyloarthropathy and diffuse thoracic ankylosis was noted. MRI of the lumbar spine was ordered. However, patient was not able to  undergo it. CT of lumbar spine without  contrast was performed and showed no evidence of canal or foraminal stenosis to explain leg symptoms, remote L1, L2 and L3 compression fractures were noted. Patient was consulted by orthopedist surgeon, Dr. Roland Rack, , who recommended lumbar epidural steroid injection through [facility] pain clinic, pain medications as needed for comfort, physical therapy, lumbosacral corset as needed for comfort when out of bed. Patient was evaluated by physical therapist and recommended skilled nursing facility placement for rehabilitation where patient  will be discharged today Discussion by problem:  * Lumbar back pain, with sciatica, refuses to perform MRI due to pain/claustrophobia, CT scan of lumbar spine was performed, no spinal stenosis or acute compression fractures were noted.. Patient was seen by physical therapist, skilled nursing facility placement was recommended for rehabilitation, where patient will be discharged today - Continue Prednisone taper, oxycodone around the clock and as needed, Flexeril, Lidoderm patch, lumbosacral corset when out of bed Pain clinic. Follow-up as outpatient   * Hypokalemia - Repleting and recheck as outpatient - Therapeutic Mg level  * COPD, chronic respiratory failure, stable - On 3 L oxygen by nasal cannula chronically at home, on 2 L here in the hospital, and oxygen saturation is 100%, possible to wean down oxygen even lower to keep saturations at 88-92%. Due to concern of CO2 retention/high bicarbonate level on BMP.  - Continue home inhalers, no exacerbation  * History of CHF - Not having any exacerbation - Continue home dose of Lasix and potassium  * CKD stage II - at Baseline    DISCHARGE CONDITIONS:   Stable  CONSULTS OBTAINED:  Treatment Team:  Corky Mull, MD  DRUG ALLERGIES:   Allergies  Allergen Reactions  . Penicillins Itching, Swelling, Rash and Other (See Comments)    Reaction:  Arm swelling  Has patient had a PCN reaction causing immediate rash, facial/tongue/throat swelling, SOB or lightheadedness with hypotension: No Has patient had a PCN reaction causing severe rash involving mucus membranes or skin necrosis: No Has patient had a PCN reaction that required hospitalization No Has patient had a PCN reaction occurring within the last 10 years: No If all of the above answers are "NO", then may proceed with Cephalosporin use.  . Levofloxacin Other (See Comments)    Reaction:  Unknown   . Sulfa Antibiotics Other (See Comments)    Reaction:  Unknown     DISCHARGE  MEDICATIONS:   Current Discharge Medication List    START taking these medications   Details  cyclobenzaprine (FLEXERIL) 5 MG tablet Take 1 tablet (5 mg total) by mouth 3 (three) times daily. Qty: 30 tablet, Refills: 0    lidocaine (LIDODERM) 5 % Place 1 patch onto the skin daily. Remove & Discard patch within 12 hours or as directed by MD Qty: 30 patch, Refills: 0    !! oxyCODONE (OXY IR/ROXICODONE) 5 MG immediate release tablet Take 1 tablet (5 mg total) by mouth every 6 (six) hours. Qty: 60 tablet, Refills: 0    !! Oxycodone HCl 10 MG TABS Take 1 tablet (10 mg total) by mouth every 4 (four) hours as needed. Qty: 60 tablet, Refills: 0    polyethylene glycol (MIRALAX / GLYCOLAX) packet Take 17 g by mouth daily. Qty: 14 each, Refills: 0    predniSONE (STERAPRED UNI-PAK 21 TAB) 10 MG (21) TBPK tablet Take 1 tablet (10 mg total) by mouth daily. Please take 6 pills on the day 1 in the morning, then taper by one pill daily until finished,  thank you Qty: 21 tablet, Refills: 0    senna (SENOKOT) 8.6 MG TABS tablet Take 2 tablets (17.2 mg total) by mouth 2 (two) times daily. Qty: 120 each, Refills: 0     !! - Potential duplicate medications found. Please discuss with provider.    CONTINUE these medications which have CHANGED   Details  traMADol (ULTRAM) 50 MG tablet Take 1 tablet (50 mg total) by mouth every 8 (eight) hours as needed. Qty: 12 tablet, Refills: 0      CONTINUE these medications which have NOT CHANGED   Details  acetaminophen (TYLENOL) 650 MG CR tablet Take 1,300 mg by mouth every 8 (eight) hours as needed for pain.    albuterol (PROVENTIL HFA;VENTOLIN HFA) 108 (90 Base) MCG/ACT inhaler Inhale 1-2 puffs into the lungs every 6 (six) hours as needed for wheezing or shortness of breath. Qty: 18 g, Refills: 3    albuterol (PROVENTIL) (2.5 MG/3ML) 0.083% nebulizer solution Take 3 mLs (2.5 mg total) by nebulization every 6 (six) hours as needed for wheezing or shortness of  breath. Qty: 120 mL, Refills: 12    carvedilol (COREG) 6.25 MG tablet Take 6.25 mg by mouth 2 (two) times daily with a meal.    dextromethorphan-guaiFENesin (MUCINEX DM) 30-600 MG 12hr tablet Take 1 tablet by mouth 2 (two) times daily as needed for cough (and/or chest congestion).    Fluticasone-Salmeterol (ADVAIR) 250-50 MCG/DOSE AEPB Inhale 1 puff into the lungs 2 (two) times daily.    furosemide (LASIX) 40 MG tablet Take 40-80 mg by mouth 2 (two) times daily. Pt takes two tablets in the morning and one tablet in the evening.    loratadine (CLARITIN) 10 MG tablet Take 10 mg by mouth daily.    Multiple Vitamin (MULTIVITAMIN WITH MINERALS) TABS tablet Take 1 tablet by mouth daily.     omeprazole (PRILOSEC) 20 MG capsule Take 1 capsule (20 mg total) by mouth daily. Qty: 30 capsule, Refills: 11    potassium chloride (K-DUR) 10 MEQ tablet Take 1 tablet (10 mEq total) by mouth daily. Qty: 30 tablet, Refills: 12   Associated Diagnoses: Acute systolic CHF (congestive heart failure) (HCC)    ranitidine (ZANTAC) 150 MG tablet Take 150 mg by mouth at bedtime.    Rivaroxaban (XARELTO) 15 MG TABS tablet Take 15 mg by mouth every evening.     tiotropium (SPIRIVA) 18 MCG inhalation capsule Place 18 mcg into inhaler and inhale daily.    vitamin B-12 (CYANOCOBALAMIN) 1000 MCG tablet Take 1,000 mcg by mouth daily.      STOP taking these medications     cetirizine (ZYRTEC ALLERGY) 10 MG tablet          DISCHARGE INSTRUCTIONS:    Patient is to follow-up with primary care physician, [facility] pain clinic doctor Dossie Arbour  If you experience worsening of your admission symptoms, develop shortness of breath, life threatening emergency, suicidal or homicidal thoughts you must seek medical attention immediately by calling 911 or calling your MD immediately  if symptoms less severe.  You Must read complete instructions/literature along with all the possible adverse  reactions/side effects for all the Medicines you take and that have been prescribed to you. Take any new Medicines after you have completely understood and accept all the possible adverse reactions/side effects.   Please note  You were cared for by a hospitalist during your hospital stay. If you have any questions about your discharge medications or the care you received while you  were in the hospital after you are discharged, you can call the unit and asked to speak with the hospitalist on call if the hospitalist that took care of you is not available. Once you are discharged, your primary care physician will handle any further medical issues. Please note that NO REFILLS for any discharge medications will be authorized once you are discharged, as it is imperative that you return to your primary care physician (or establish a relationship with a primary care physician if you do not have one) for your aftercare needs so that they can reassess your need for medications and monitor your lab values.    Today   CHIEF COMPLAINT:   Chief Complaint  Patient presents with  . Hip Pain    HISTORY OF PRESENT ILLNESS:  [name] [name]  is a 82 y.o. female with a known history of COPD, chronic respiratory failure, on 3 L of oxygen through nasal cannula at home, hypertension, CAD, CHF, coronary artery disease, who presents to the hospital with complaints of 40. History of bilateral hip pain radiating down to the legs/thighs, worsening with movements. Patient was seen in the emergency room on 09/23/2015 for similar complaints, she had plain films of right and left hip which were negative for acute fracture and was discharged home, since that time, her pain has worsened, patient denied any falls or injury, she had no numbness or weakness, bowel or bladder incontinence, however, complained of inability to be a weight on her feet and ambulate. On arrival to the hospital patient was noted to have mild renal insufficiency  with creatinine of 1.03, hypokalemia, mild anemia, hyperglycemia. Pelvic x-ray revealed no acute abnormality, lumbar spine x-ray showed no acute findings, chronic L1, L2 and L3 compression fractures, spondyloarthropathy and diffuse thoracic ankylosis was noted. MRI of the lumbar spine was ordered. However, patient was not able to  undergo it. CT of lumbar spine without contrast was performed and showed no evidence of canal or foraminal stenosis to explain leg symptoms, remote L1, L2 and L3 compression fractures were noted. Patient was consulted by orthopedist surgeon, Dr. Roland Rack, , who recommended lumbar epidural steroid injection through [facility] pain clinic, pain medications as needed for comfort, physical therapy, lumbosacral corset as needed for comfort when out of bed. Patient was evaluated by physical therapist and recommended skilled nursing facility placement for rehabilitation where patient will be discharged today Discussion by problem:  * Lumbar back pain, with sciatica, refuses to perform MRI due to pain/claustrophobia, CT scan of lumbar spine was performed, no spinal stenosis or acute compression fractures were noted.. Patient was seen by physical therapist, skilled nursing facility placement was recommended for rehabilitation, where patient will be discharged today - Continue Prednisone taper, oxycodone around the clock and as needed, Flexeril, Lidoderm patch, lumbosacral corset when out of bed.Pain clinic. Follow-up as outpatient    * Hypokalemia - Repleting and recheck as outpatient - Therapeutic Mg level  * COPD, chronic respiratory failure, stable - On 3 L oxygen by nasal cannula chronically at home, on 2 L here in the hospital, and oxygen saturation is 100%, possible to wean down oxygen even lower to keep saturations at 88-92%. Due to concern of CO2 retention/high bicarbonate level on BMP.  - Continue home inhalers, no exacerbation  * History of CHF - Not  having any exacerbation - Continue home dose of Lasix and potassium  * CKD stage II - at Baseline    VITAL SIGNS:  Blood pressure 117/65,  pulse 57, temperature 97.5 F (36.4 C), temperature source Oral, resp. rate 18, height 5\' 7"  (1.702 m), weight 76.159 kg (167 lb 14.4 oz), SpO2 96 %.  I/O:   Intake/Output Summary (Last 24 hours) at 09/29/15 1227 Last data filed at 09/29/15 0900  Gross per 24 hour  Intake    480 ml  Output   1200 ml  Net   -720 ml    PHYSICAL EXAMINATION:  GENERAL:  82 y.o.-year-old patient lying in the bed with no acute distress.  EYES: Pupils equal, round, reactive to light and accommodation. No scleral icterus. Extraocular muscles intact.  HEENT: Head atraumatic, normocephalic. Oropharynx and nasopharynx clear.  NECK:  Supple, no jugular venous distention. No thyroid enlargement, no tenderness.  LUNGS: Normal breath sounds bilaterally, no wheezing, rales,rhonchi or crepitation. No use of accessory muscles of respiration.  CARDIOVASCULAR: S1, S2 normal. No murmurs, rubs, or gallops.  ABDOMEN: Soft, non-tender, non-distended. Bowel sounds present. No organomegaly or mass.  EXTREMITIES: No pedal edema, cyanosis, or clubbing.  NEUROLOGIC: Cranial nerves II through XII are intact. Muscle strength 5/5 in all extremities. Sensation intact. Gait not checked.  PSYCHIATRIC: The patient is alert and oriented x 3.  SKIN: No obvious rash, lesion, or ulcer.   DATA REVIEW:   CBC  Recent Labs Lab 09/28/15 0637  WBC 8.0  HGB 10.8*  HCT 32.8*  PLT 183    Chemistries   Recent Labs Lab 09/27/15 0538 09/28/15 0637  NA 136 141  K 3.2* 3.4*  CL 91* 95*  CO2 38* 41*  GLUCOSE 211* 135*  BUN 20 20  CREATININE 1.03* 0.97  CALCIUM 8.5* 8.8*  MG 1.9  --     Cardiac Enzymes No results for input(s): TROPONINI in the last 168 hours.  Microbiology Results  Results for orders placed or performed in visit on 03/28/15  Urine culture     Status: None    Collection Time: 03/28/15 12:00 AM  Result Value Ref Range Status   Urine Culture, Routine Final report  Final   Urine Culture result 1 Comment  Final    Comment: Mixed urogenital flora Greater than 100,000 colony forming units per mL     RADIOLOGY:  Ct Lumbar Spine Wo Contrast  09/28/2015  CLINICAL DATA:  Lumbar back pain radiating to the legs, mainly on the right. Patient refused MRI. EXAM: CT LUMBAR SPINE WITHOUT CONTRAST TECHNIQUE: Multidetector CT imaging of the lumbar spine was performed without intravenous contrast administration. Multiplanar CT image reconstructions were also generated. COMPARISON:  Radiography from 2 days prior FINDINGS: No evidence of acute fracture or traumatic malalignment. Remote L1, L2, and L3 compression fractures. Height loss at these levels is in the 50-75% range and causes exaggerated kyphosis. There was mild retropulsion of L1 without significant canal stenosis. There is ankylosis of the lower thoracic levels and L1-L2. Spondylotic changes without notable disc narrowing. Diffuse facet arthropathy with spurring. No evidence of impingement at the canal or foraminal level. Small pleural effusions which are layering. Extensive atherosclerotic calcification IMPRESSION: 1. No acute finding. 2. No evidence of canal or foraminal stenosis to explain leg symptoms. 3. Remote L1, L2, and L3 compression fractures. Electronically Signed   By: Monte Fantasia M.D.   On: 09/28/2015 16:04    EKG:   Orders placed or performed during the hospital encounter of 08/23/15  . ED EKG  . ED EKG  . EKG 12-Lead  . EKG 12-Lead  . ED EKG  . ED EKG  .  EKG      Management plans discussed with the patient, family and they are in agreement.  CODE STATUS:     Code Status Orders        Start     Ordered   09/26/15 2142  Full code   Continuous     09/26/15 2141    Code Status History    Date Active Date Inactive Code Status Order ID Comments User Context   08/24/2015  2:35 AM  08/25/2015  6:20 PM Full Code [id number]  Harrie Foreman, MD Inpatient   06/27/2015 10:43 AM 06/30/2015  7:41 PM Full Code [id number]  Loletha Grayer, MD ED   04/10/2015  6:10 PM 04/14/2015  5:52 PM Full Code [id number]  Max Sane, MD Inpatient   03/03/2015  8:05 PM 03/08/2015  6:00 PM Full Code [id number]  Fritzi Mandes, MD ED   02/07/2015 11:08 AM 02/07/2015  7:00 PM Full Code [id number]  Katha Cabal, MD Inpatient   10/25/2012 10:04 PM 11/06/2012  7:12 PM Full Code [id number]  Elsie Stain, MD Inpatient      TOTAL TIME TAKING CARE OF THIS PATIENT: 40 minutes.    Theodoro Grist M.D on 09/29/2015 at 12:27 PM  Between 7am to 6pm - Pager - [phone]  After 6pm go to [email or web address] - password [email or web address] [facility]  Office  [phone]  CC: Primary care physician; [name], MD

## 2015-09-29 NOTE — Progress Notes (Signed)
Subjective: The patient still notes significant lower back pain when transferring or trying to ambulate. She does find the present pain medication regimen to be helpful. She denies any new complaints. She was able to undergo a lumbar CT scan yesterday.  Objective: Vital signs in last 24 hours: Temp:  [97.4 F (36.3 C)-98 F (36.7 C)] 97.5 F (36.4 C) (04/21 0746) Pulse Rate:  [55-81] 57 (04/21 0746) Resp:  [18] 18 (04/21 0746) BP: (109-139)/(42-82) 117/65 mmHg (04/21 0746) SpO2:  [91 %-100 %] 96 % (04/21 0746)  Intake/Output from previous day: 04/20 0701 - 04/21 0700 In: 1735 [P.O.:480; I.V.:1255] Out: 1650 [Urine:1650] Intake/Output this shift:     Recent Labs  09/26/15 1415 09/27/15 0538 09/28/15 0637  HGB 11.5* 11.6* 10.8*    Recent Labs  09/27/15 0538 09/28/15 0637  WBC 4.2 8.0  RBC 3.63* 3.52*  HCT 34.1* 32.8*  PLT 182 183    Recent Labs  09/27/15 0538 09/28/15 0637  NA 136 141  K 3.2* 3.4*  CL 91* 95*  CO2 38* 41*  BUN 20 20  CREATININE 1.03* 0.97  GLUCOSE 211* 135*  CALCIUM 8.5* 8.8*    Recent Labs  09/26/15 1415  INR 1.48    Physical Exam: Orthopedic examination again is limited to her lumbar spine and bilateral lower extremities. Her examination remains unchanged as compared to both yesterday and the day before. She remains neurovascularly intact in both lower extremities.  X-rays: A CT scan of the lumbar spine was performed last evening. By report, the scan demonstrates no evidence for any acute fractures in the lumbar spine or proximal sacral region. Multilevel degenerative changes are noted, as well as old L1, L2, and L3 compression fractures. There is no evidence for any central or foraminal stenosis to account for her symptoms either. Both the films and report were reviewed by myself and discussed with the patient.  Assessment: Acute onset of low back pain with underlying degenerative disc disease.  Plan: The treatment options are  reviewed with the patient. She is reassured that the lumbar CT scan performed yesterday shows no evidence for any acute fractures or significant stenosis. Therefore, I do not feel that surgical intervention is warranted at this time. The patient may benefit from a lumbar epidural steroid injection. This could be arranged through [place] pain clinic. Therefore, I would recommend that a consult be obtained from the pain clinic to see if they could perform this procedure for her. Meanwhile, she is to continue to be provided pain medication as needed for comfort. She may be mobilized with physical therapy as symptoms permit. Most likely, a period of rehabilitation placement will be necessary. In addition, she may use a lumbosacral corset as needed for comfort when out of bed.  As I do not feel that I can provide any further assistance, I will sign off at this time. Please call me if you need me to re-evaluate the patient. Thank you.   Marshall Cork Kelcy Baeten 09/29/2015, 7:55 AM

## 2015-09-29 NOTE — Progress Notes (Signed)
Occupational Therapy Treatment Patient Details Name: [name] [name] MRN: [id number] DOB: [birth year] Today's Date: 09/29/2015    History of present illness [name] [name] is a 82 y.o. female with a history of multiple medical problems, including COPD (requiring home O2 nightly), hypertension, coronary artery disease, atrial fibrillation, breast cancer, stage III kidney disease, and peripheral vascular disease resulting in claudication, who lives at home independently. Apparently about 5 days ago, she developed increased lower back pain and was unable to ambulate without assistance. She was brought to the emergency room by her friend on Saturday where she was evaluated then finally sent home with Tylenol. Because of continued symptoms, she returned to the emergency room on Tuesday and subsequently was admitted for further evaluation and treatment. The patient notes that the pain is primarily in her lower back but will radiate to both buttocks and hip regions, right more severely than left, with some pain radiating into the right thigh. She denies any numbness or paresthesias to either lower extremity, and denies any bowel or bladder complaints. She denies any recent fall or other injury that may have precipitated the onset of these symptoms. No reported falls in the last 12 months   OT comments  Pt. Is a 82 y.o. Female who was admitted to [facility] secondary to increased back pain. Pt. Has made progress since the initial eval. Pt. Requires minA toileting transfers and clothing negotiation. Pt. Requires ModA Le dressing with A/E use. Pt. Continues to have 7/10 pain in her back. Pt. Is transferring to SNF level of care. Recommend OT follow-up services.   Follow Up Recommendations  SNF    Equipment Recommendations       Recommendations for Other Services      Precautions / Restrictions         Mobility Bed Mobility               General bed mobility comments: received up in  recliner  Transfers Overall transfer level: Needs assistance Equipment used: Rolling walker (2 wheeled)   Sit to Stand: Min assist              Balance Overall balance assessment: Needs assistance   Sitting balance-Leahy Scale: Good       Standing balance-Leahy Scale: Fair                     ADL Overall ADL's : Needs assistance/impaired Eating/Feeding: Independent   Grooming: Minimal assistance;Set up           Lower Body Dressing : Moderate assistance       Toilet Transfer: Minimal assistance   Toileting- Clothing Manipulation and Hygiene: Minimal assistance                Vision                     Perception     Praxis      Cognition   Behavior During Therapy: WFL for tasks assessed/performed Overall Cognitive Status: Within Functional Limits for tasks assessed                       Extremity/Trunk Assessment               Exercises     Shoulder Instructions       General Comments      Pertinent Vitals/ Pain       Pain Assessment: 0-10 Pain Score: 7  Pain  Location: Back  Home Living                                          Prior Functioning/Environment              Frequency Min 1X/week     Progress Toward Goals  OT Goals(current goals can now be found in the care plan section)  Progress towards OT goals: Progressing toward goals     Plan Discharge plan remains appropriate    Co-evaluation                 End of Session Equipment Utilized During Treatment: Gait belt;Rolling walker   Activity Tolerance Patient tolerated treatment well   Patient Left with nursing/sitter in room   Nurse Communication      Functional Limitation: Self care Self Care Current Status [id number]): At least 40 percent but less than 60 percent impaired, limited or restricted Self Care Goal Status [id number]): At least 20 percent but less than 40 percent impaired, limited or restricted    Time: 1400-1423 OT Time Calculation (min): 23 min  Charges: OT G-codes **NOT FOR INPATIENT CLASS** Functional Limitation: Self care Self Care Current Status [id number]): At least 40 percent but less than 60 percent impaired, limited or restricted Self Care Goal Status [id number]): At least 20 percent but less than 40 percent impaired, limited or restricted OT General Charges $OT Visit: 1 Procedure OT Treatments $Self Care/Home Management : 23-37 mins    Harrel Carina, MS, OTR/L  Harrel Carina 09/29/2015, 2:47 PM

## 2015-09-29 NOTE — NC FL2 (Signed)
[place] LEVEL OF CARE SCREENING TOOL     IDENTIFICATION  Patient Name: [name] [name] Birthdate: 1922-12-31 Sex: female Admission Date (Current Location): 09/26/2015  [facility] and [place] Number:  Engineering geologist and Address:  [facility], [address], [place],  [address]      Provider Number: [id number]  Attending Physician Name and Address:  Theodoro Grist, MD  Relative Name and Phone Number:       Current Level of Care: Hospital Recommended Level of Care: [place] Prior Approval Number:    Date Approved/Denied:   PASRR Number:    Discharge Plan: SNF    Current Diagnoses: Patient Active Problem List   Diagnosis Date Noted  . Back pain 09/26/2015  . COPD with acute exacerbation ([place]) 06/27/2015  . Problems with swallowing and mastication   . Hiatal hernia   . Stricture and stenosis of esophagus   . Respiratory failure with hypoxia ([place]) 04/10/2015  . Diverticulitis large intestine 03/28/2015  . Acute on chronic respiratory failure with hypoxemia ([place]) 03/03/2015  . Essential hypertension 04/08/2014  . Chronic systolic heart failure ([place]) 04/30/2013  . PAD (peripheral artery disease) ([place]) 12/07/2012  . Atrial fibrillation ([place]) 10/31/2012  . Pleural effusion 10/29/2012  . Acute systolic CHF (congestive heart failure) ([place]) 10/28/2012  . Acute respiratory failure ([place]) 10/28/2012  . Hypokalemia 10/28/2012  . Cardiogenic shock ([place]) 10/28/2012  . GERD (gastroesophageal reflux disease) 10/28/2012  . Esophageal stricture 10/28/2012  . Community acquired pneumonia 10/26/2012  . Takotsubo syndrome 10/25/2012    Orientation RESPIRATION BLADDER Height & Weight     Self, Time, Situation, Place  O2 (2L) Continent Weight: 167 lb 14.4 oz (76.159 kg) Height:  5\' 7"  (170.2 cm)  BEHAVIORAL SYMPTOMS/MOOD NEUROLOGICAL BOWEL NUTRITION STATUS  Other (Comment) (none)   Continent Diet (regular)  AMBULATORY  STATUS COMMUNICATION OF NEEDS Skin   Extensive Assist Verbally Normal                       Personal Care Assistance Level of Assistance  Bathing, Feeding, Dressing Bathing Assistance: Limited assistance Feeding assistance: Limited assistance Dressing Assistance: Limited assistance     Functional Limitations Info  Sight, Hearing, Speech Sight Info: Impaired Hearing Info: Impaired Speech Info: Impaired    SPECIAL CARE FACTORS FREQUENCY  PT (By licensed PT), OT (By licensed OT)     PT Frequency: 5 OT Frequency: 5            Contractures Contractures Info: Not present    Additional Factors Info  Code Status, Allergies, Psychotropic, Insulin Sliding Scale, Isolation Precautions Code Status Info: Full Allergies Info: Penicillins, Levofloxacin, Sulfa Antibiotics Psychotropic Info: none Insulin Sliding Scale Info: none Isolation Precautions Info: none     Current Medications (09/29/2015):  This is the current hospital active medication list Current Facility-Administered Medications  Medication Dose Route Frequency Provider Last Rate Last Dose  . acetaminophen (TYLENOL) tablet 650 mg  650 mg Oral Q6H PRN Max Sane, MD       Or  . acetaminophen (TYLENOL) suppository 650 mg  650 mg Rectal Q6H PRN Vipul Manuella Ghazi, MD      . albuterol (PROVENTIL) (2.5 MG/3ML) 0.083% nebulizer solution 2.5 mg  2.5 mg Nebulization Q6H PRN Max Sane, MD   2.5 mg at 09/29/15 [id number]  . bisacodyl (DULCOLAX) EC tablet 5 mg  5 mg Oral Daily PRN Max Sane, MD      . carvedilol (COREG)  tablet 6.25 mg  6.25 mg Oral BID WC Max Sane, MD   6.25 mg at 09/29/15 0853  . cyclobenzaprine (FLEXERIL) tablet 5 mg  5 mg Oral TID Max Sane, MD   5 mg at 09/29/15 0906  . furosemide (LASIX) tablet 40 mg  40 mg Oral BID Max Sane, MD   40 mg at 09/29/15 0853  . HYDROmorphone (DILAUDID) injection 1 mg  1 mg Intravenous Q2H PRN Max Sane, MD   1 mg at 09/29/15 0606  . lidocaine (LIDODERM) 5 % 1 patch  1 patch  Transdermal Q24H Theodoro Grist, MD   1 patch at 09/29/15 0906  . loratadine (CLARITIN) tablet 10 mg  10 mg Oral Daily Max Sane, MD   10 mg at 09/29/15 0906  . mometasone-formoterol (DULERA) 200-5 MCG/ACT inhaler 2 puff  2 puff Inhalation BID Max Sane, MD   2 puff at 09/29/15 0851  . multivitamin with minerals tablet 1 tablet  1 tablet Oral Daily Max Sane, MD   1 tablet at 09/29/15 0906  . ondansetron (ZOFRAN) tablet 4 mg  4 mg Oral Q6H PRN Max Sane, MD       Or  . ondansetron (ZOFRAN) injection 4 mg  4 mg Intravenous Q6H PRN Max Sane, MD      . oxyCODONE (Oxy IR/ROXICODONE) immediate release tablet 10 mg  10 mg Oral Q4H PRN Theodoro Grist, MD   10 mg at 09/29/15 0906  . oxyCODONE (Oxy IR/ROXICODONE) immediate release tablet 5 mg  5 mg Oral Q6H Theodoro Grist, MD      . pantoprazole (PROTONIX) EC tablet 40 mg  40 mg Oral Daily Max Sane, MD   40 mg at 09/29/15 0920  . polyethylene glycol (MIRALAX / GLYCOLAX) packet 17 g  17 g Oral Daily Max Sane, MD   17 g at 09/29/15 0913  . potassium chloride (K-DUR,KLOR-CON) CR tablet 10 mEq  10 mEq Oral Daily Max Sane, MD   10 mEq at 09/29/15 0920  . predniSONE (DELTASONE) tablet 50 mg  50 mg Oral Q breakfast Max Sane, MD   50 mg at 09/29/15 0853  . Rivaroxaban (XARELTO) tablet 15 mg  15 mg Oral QPM Max Sane, MD   15 mg at 09/28/15 1739  . senna (SENOKOT) tablet 17.2 mg  2 tablet Oral BID Max Sane, MD   17.2 mg at 09/29/15 0913  . tiotropium (SPIRIVA) inhalation capsule 18 mcg  18 mcg Inhalation Daily Max Sane, MD   18 mcg at 09/29/15 [id number]  . traZODone (DESYREL) tablet 25 mg  25 mg Oral QHS PRN Max Sane, MD   25 mg at 09/27/15 2358  . vitamin B-12 (CYANOCOBALAMIN) tablet 1,000 mcg  1,000 mcg Oral Daily Max Sane, MD   1,000 mcg at 09/29/15 [id number]     Discharge Medications: Please see discharge summary for a list of discharge medications.  Relevant Imaging Results:  Relevant Lab Results:   Additional Information    Loralyn Freshwater, LCSW

## 2015-09-29 NOTE — Clinical Social Work Placement (Signed)
   CLINICAL SOCIAL WORK PLACEMENT  NOTE  Date:  09/29/2015  Patient Details  Name: [name] [name] MRN: [id number] Date of Birth: 02-05-1923  Clinical Social Work is seeking post-discharge placement for this patient at the [place] level of care (*CSW will initial, date and re-position this form in  chart as items are completed):  Yes   Patient/family provided with [facility] Work Department's list of facilities offering this level of care within the geographic area requested by the patient (or if unable, by the patient's family).  Yes   Patient/family informed of their freedom to choose among providers that offer the needed level of care, that participate in Medicare, Medicaid or managed care program needed by the patient, have an available bed and are willing to accept the patient.  Yes   Patient/family informed of [facility]'s ownership interest in [facility] and [facility], as well as of the fact that they are under no obligation to receive care at these facilities.  PASRR submitted to EDS on 09/27/15     PASRR number received on 09/27/15     Existing PASRR number confirmed on       FL2 transmitted to all facilities in geographic area requested by pt/family on 09/27/15     FL2 transmitted to all facilities within larger geographic area on       Patient informed that his/her managed care company has contracts with or will negotiate with certain facilities, including the following:        Yes   Patient/family informed of bed offers received.  Patient chooses bed at [facility]     Physician recommends and patient chooses bed at  [facility])    Patient to be transferred to [facility] on 09/29/15.  Patient to be transferred to facility by [facility]     Patient family notified on 09/29/15 of transfer.  Name of family member notified:  Pt     PHYSICIAN Please sign FL2     Additional Comment:     _______________________________________________ Darden Dates, LCSW 09/29/2015, 2:01 PM

## 2015-09-29 NOTE — Care Management (Signed)
I have updated [facility] that patient's plan is for SNF at [facility].

## 2015-10-03 ENCOUNTER — Inpatient Hospital Stay: Payer: Medicare Other | Admitting: Family Medicine

## 2015-10-03 DIAGNOSIS — I4891 Unspecified atrial fibrillation: Secondary | ICD-10-CM | POA: Diagnosis not present

## 2015-10-03 DIAGNOSIS — I509 Heart failure, unspecified: Secondary | ICD-10-CM | POA: Diagnosis not present

## 2015-10-03 DIAGNOSIS — D649 Anemia, unspecified: Secondary | ICD-10-CM | POA: Diagnosis not present

## 2015-10-03 DIAGNOSIS — I2510 Atherosclerotic heart disease of native coronary artery without angina pectoris: Secondary | ICD-10-CM | POA: Diagnosis not present

## 2015-10-05 ENCOUNTER — Other Ambulatory Visit: Payer: Self-pay | Admitting: *Deleted

## 2015-10-05 DIAGNOSIS — I5043 Acute on chronic combined systolic (congestive) and diastolic (congestive) heart failure: Secondary | ICD-10-CM

## 2015-10-05 DIAGNOSIS — Z029 Encounter for administrative examinations, unspecified: Secondary | ICD-10-CM

## 2015-10-05 DIAGNOSIS — J441 Chronic obstructive pulmonary disease with (acute) exacerbation: Secondary | ICD-10-CM

## 2015-10-08 ENCOUNTER — Other Ambulatory Visit: Payer: Self-pay | Admitting: Family Medicine

## 2015-10-09 ENCOUNTER — Ambulatory Visit: Payer: Medicare Other | Admitting: Cardiovascular Disease

## 2015-10-09 ENCOUNTER — Other Ambulatory Visit: Payer: Self-pay | Admitting: *Deleted

## 2015-10-09 ENCOUNTER — Encounter: Payer: Self-pay | Admitting: *Deleted

## 2015-10-09 NOTE — Patient Outreach (Signed)
[facility]) Care Management  [facility] Social Work  10/09/2015  [name] [name] [birth year] [id number]  Subjective:  Patient is a 82 year old female, currently in rehabilitation at [facility]  Objective:   Encounter Medications:  Outpatient Encounter Prescriptions as of 10/09/2015  Medication Sig  . acetaminophen (TYLENOL) 650 MG CR tablet Take 1,300 mg by mouth every 8 (eight) hours as needed for pain.  [name] albuterol (PROVENTIL HFA;VENTOLIN HFA) 108 (90 Base) MCG/ACT inhaler Inhale 1-2 puffs into the lungs every 6 (six) hours as needed for wheezing or shortness of breath.  [name] albuterol (PROVENTIL) (2.5 MG/3ML) 0.083% nebulizer solution Take 3 mLs (2.5 mg total) by nebulization every 6 (six) hours as needed for wheezing or shortness of breath.  . carvedilol (COREG) 6.25 MG tablet Take 6.25 mg by mouth 2 (two) times daily with a meal.  . cyclobenzaprine (FLEXERIL) 5 MG tablet Take 1 tablet (5 mg total) by mouth 3 (three) times daily.  [name] dextromethorphan-guaiFENesin (MUCINEX DM) 30-600 MG 12hr tablet Take 1 tablet by mouth 2 (two) times daily as needed for cough (and/or chest congestion).  . Fluticasone-Salmeterol (ADVAIR) 250-50 MCG/DOSE AEPB Inhale 1 puff into the lungs 2 (two) times daily.  . furosemide (LASIX) 40 MG tablet Take 40-80 mg by mouth 2 (two) times daily. Pt takes two tablets in the morning and one tablet in the evening.  . lidocaine (LIDODERM) 5 % Place 1 patch onto the skin daily. Remove & Discard patch within 12 hours or as directed by MD  . loratadine (CLARITIN) 10 MG tablet Take 10 mg by mouth daily.  . Multiple Vitamin (MULTIVITAMIN WITH MINERALS) TABS tablet Take 1 tablet by mouth daily.   [name] omeprazole (PRILOSEC) 20 MG capsule Take 1 capsule (20 mg total) by mouth daily.  [name] oxyCODONE (OXY IR/ROXICODONE) 5 MG immediate release tablet Take 1 tablet (5 mg total) by mouth every 6 (six) hours.  . Oxycodone HCl 10 MG TABS Take 1 tablet (10 mg total) by mouth every 4  (four) hours as needed.  . polyethylene glycol (MIRALAX / GLYCOLAX) packet Take 17 g by mouth daily.  . potassium chloride (K-DUR) 10 MEQ tablet Take 1 tablet (10 mEq total) by mouth daily.  . predniSONE (STERAPRED UNI-PAK 21 TAB) 10 MG (21) TBPK tablet Take 1 tablet (10 mg total) by mouth daily. Please take 6 pills on the day 1 in the morning, then taper by one pill daily until finished, thank you  . ranitidine (ZANTAC) 150 MG tablet Take 150 mg by mouth at bedtime.  . Rivaroxaban (XARELTO) 15 MG TABS tablet Take 15 mg by mouth every evening.   . senna (SENOKOT) 8.6 MG TABS tablet Take 2 tablets (17.2 mg total) by mouth 2 (two) times daily.  [name] tiotropium (SPIRIVA) 18 MCG inhalation capsule Place 18 mcg into inhaler and inhale daily.  . traMADol (ULTRAM) 50 MG tablet Take 1 tablet (50 mg total) by mouth every 8 (eight) hours as needed.  . vitamin B-12 (CYANOCOBALAMIN) 1000 MCG tablet Take 1,000 mcg by mouth daily.   No facility-administered encounter medications on file as of 10/09/2015.    Functional Status:  In your present state of health, do you have any difficulty performing the following activities: 09/26/2015 09/11/2015  Hearing? N Y  Vision? N N  Difficulty concentrating or making decisions? N N  Walking or climbing stairs? Y N  Dressing or bathing? Y Y  Doing errands, shopping? N N  Preparing Food and eating ? -  N  Using the Toilet? - N  In the past six months, have you accidently leaked urine? - Y  Do you have problems with loss of bowel control? - N  Managing your Medications? - N  Managing your Finances? - N  Housekeeping or managing your Housekeeping? - Y    Fall/Depression Screening:  PHQ 2/9 Scores 09/11/2015 08/10/2015 08/03/2015 07/07/2015 05/16/2015 04/24/2015 03/15/2015  PHQ - 2 Score 0 0 0 0 0 0 0    Assessment: This Education officer, museum visited patient at [employer].  Per patient, she is feeling much better and should be discharging home by the end of the week. Patient  discussed actively participating in physical and occupational therapy.  Patient's friend Parke Simmers visiting patient in her room.  Per patient, Parke Simmers and patient's daughter  in law, [name] are her primary caregivers and provide positive support  Per patient, Parke Simmers and Diane assist with all of her household and care giving needs. Per patient, she will received home health when she returns home.  Shawn, discharge planner was not available at the time of this social worker's visit to confirm discharge plans.  Voicemail message left for a return call to confirm discharge plan  Plan:  This social worker will follow up with the discharge planner regarding patient's discharge needs

## 2015-10-11 ENCOUNTER — Other Ambulatory Visit: Payer: Self-pay | Admitting: *Deleted

## 2015-10-11 DIAGNOSIS — I10 Essential (primary) hypertension: Secondary | ICD-10-CM | POA: Diagnosis not present

## 2015-10-11 DIAGNOSIS — J449 Chronic obstructive pulmonary disease, unspecified: Secondary | ICD-10-CM | POA: Diagnosis not present

## 2015-10-11 DIAGNOSIS — I509 Heart failure, unspecified: Secondary | ICD-10-CM | POA: Diagnosis not present

## 2015-10-11 DIAGNOSIS — M545 Low back pain: Secondary | ICD-10-CM | POA: Diagnosis not present

## 2015-10-11 DIAGNOSIS — I4891 Unspecified atrial fibrillation: Secondary | ICD-10-CM | POA: Diagnosis not present

## 2015-10-11 NOTE — Patient Outreach (Signed)
[facility]) Care Management  10/11/2015  MADRA PARRON 1923-03-07 [id number]   This social worker visited [facility] to speak with discharge planner, Steward Ros in regards to patient's plan of care.  Per Raquel Sarna, patient will be discharging home on 10/18/15.  She will discharge home with home health services.   Plan:  This social worker will inform RNCM of patient's discharge.            This Education officer, museum will follow up with patient to assess for any further social work needs.    Sheralyn Boatman [facility] Care Management [phone]

## 2015-10-12 ENCOUNTER — Other Ambulatory Visit: Payer: Self-pay | Admitting: *Deleted

## 2015-10-12 MED ORDER — CARVEDILOL 6.25 MG PO TABS: 6.2500 mg | ORAL_TABLET | Freq: Two times a day (BID) | ORAL | Status: AC

## 2015-10-17 ENCOUNTER — Ambulatory Visit: Payer: Medicare Other | Admitting: Family Medicine

## 2015-10-18 DIAGNOSIS — K219 Gastro-esophageal reflux disease without esophagitis: Secondary | ICD-10-CM | POA: Diagnosis not present

## 2015-10-18 DIAGNOSIS — N186 End stage renal disease: Secondary | ICD-10-CM | POA: Diagnosis not present

## 2015-10-18 DIAGNOSIS — J449 Chronic obstructive pulmonary disease, unspecified: Secondary | ICD-10-CM | POA: Diagnosis not present

## 2015-10-18 DIAGNOSIS — I4891 Unspecified atrial fibrillation: Secondary | ICD-10-CM | POA: Diagnosis not present

## 2015-10-18 DIAGNOSIS — S32009A Unspecified fracture of unspecified lumbar vertebra, initial encounter for closed fracture: Secondary | ICD-10-CM | POA: Diagnosis not present

## 2015-10-19 ENCOUNTER — Other Ambulatory Visit: Payer: Self-pay | Admitting: *Deleted

## 2015-10-19 NOTE — Patient Outreach (Signed)
  [facility]) Care Management  10/19/2015  [name] [name] 06-30-1922 [id number]   Phone call to discharge planner at [facility] who confirmed that patient remains at their facility.  Per discharge planner, patient developed and infection and is being treated with antibiotics, no discharge date has been set yet.   Plan:  This social worker will continue to follow patient while she is in rehabilitation.            This Education officer, museum will update [place] [place] Care Management [phone]

## 2015-10-27 ENCOUNTER — Other Ambulatory Visit: Payer: Self-pay | Admitting: *Deleted

## 2015-10-27 ENCOUNTER — Encounter: Payer: Self-pay | Admitting: *Deleted

## 2015-10-27 NOTE — Patient Outreach (Signed)
[facility]) Care Management  10/27/2015  KAMAR TOLSON June 22, 1922 [id number]   Phone call to [facility], spoke with discharge planner Queen Blossom who confirmed that patient passed away in her sleep on 10/31/2015 while at the facility.   RNCM notified, case closed.

## 2015-11-07 ENCOUNTER — Ambulatory Visit: Payer: Medicare Other | Admitting: Cardiovascular Disease

## 2015-11-09 DEATH — deceased

## 2017-02-12 IMAGING — CT CT ANGIO CHEST
1 of 2 series · 18 of 30 positions shown · IV contrast (APPLIED)
Comparison: Chest radiographs 2783 hours today. Chest CTA
03/15/2014.

CLINICAL DATA: [AGE] female with shortness of breath. COPD
exacerbation. Initial encounter.

EXAM:
CT ANGIOGRAPHY CHEST WITH CONTRAST
TECHNIQUE: Multidetector CT imaging of the chest was performed using the
standard protocol during bolus administration of intravenous
contrast. Multiplanar CT image reconstructions and MIPs were
obtained to evaluate the vascular anatomy.
CONTRAST:  75mL OMNIPAQUE IOHEXOL 350 MG/ML SOLN

[Series 6: pe 1.0 thins · axial · 0.68mm/px · z∈[-552,-301]mm · 18 of 283 slices shown]
[im 16/283  lung]
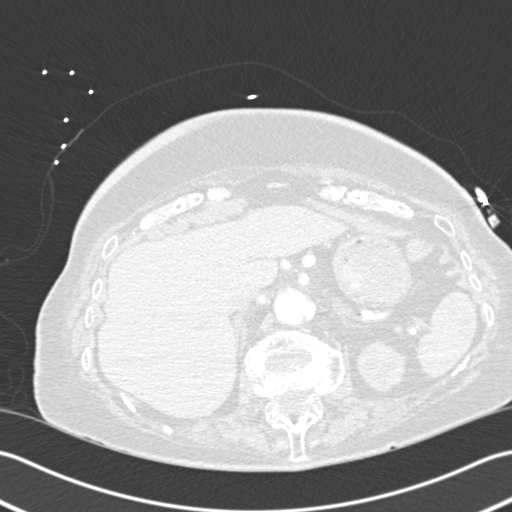
[im 32/283  mediastinal]
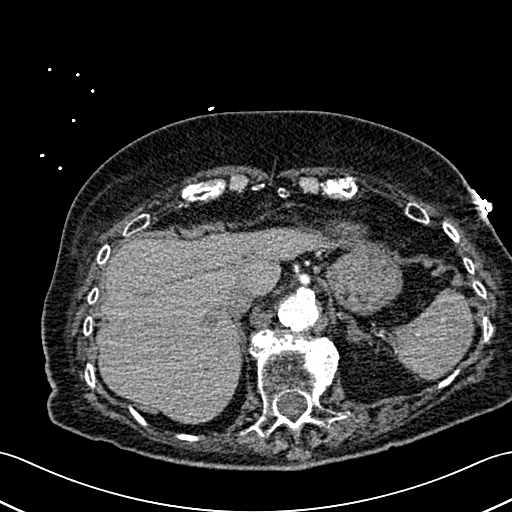
[im 48/283  lung]
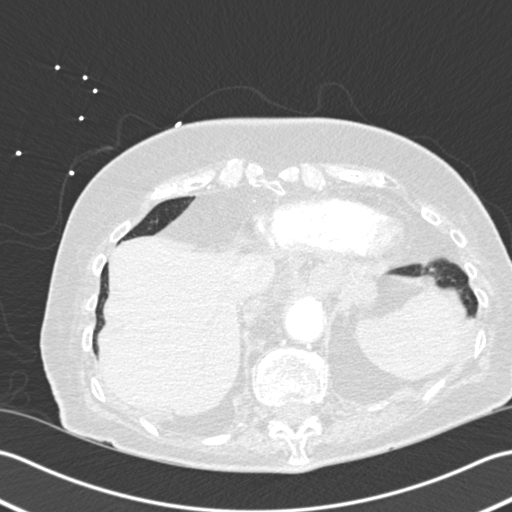
[im 63/283  mediastinal]
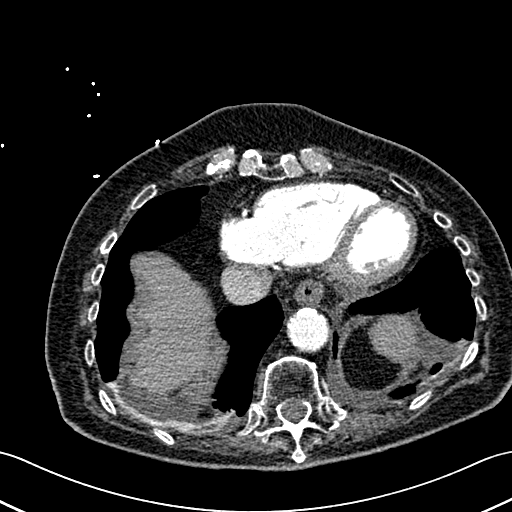
[im 79/283  lung]
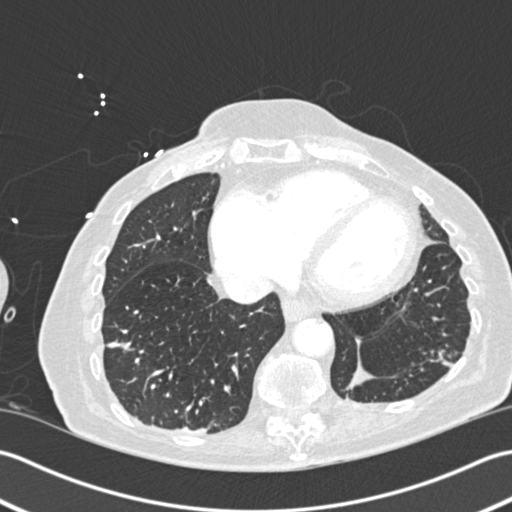
[im 95/283  mediastinal]
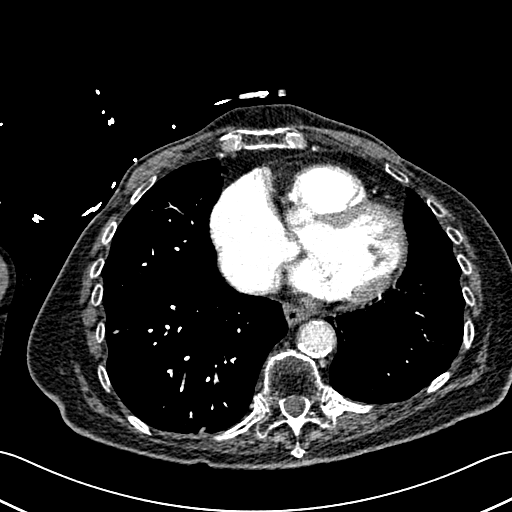
[im 110/283  lung]
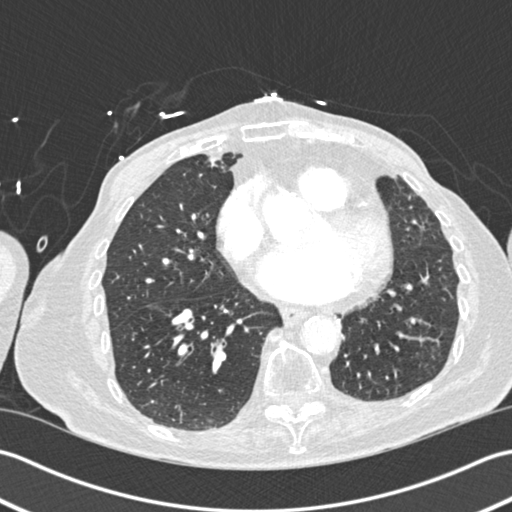
[im 126/283  mediastinal]
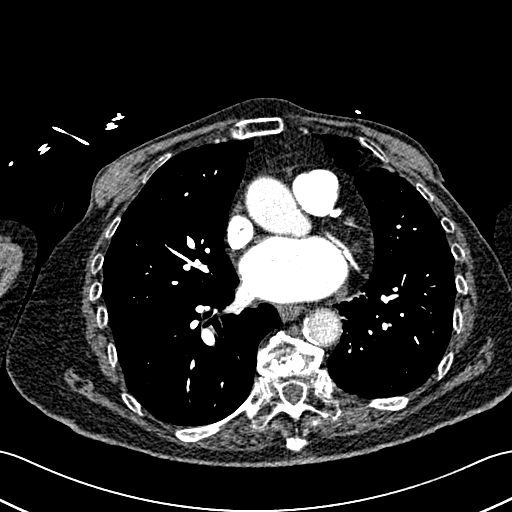
[im 132/283  lung]
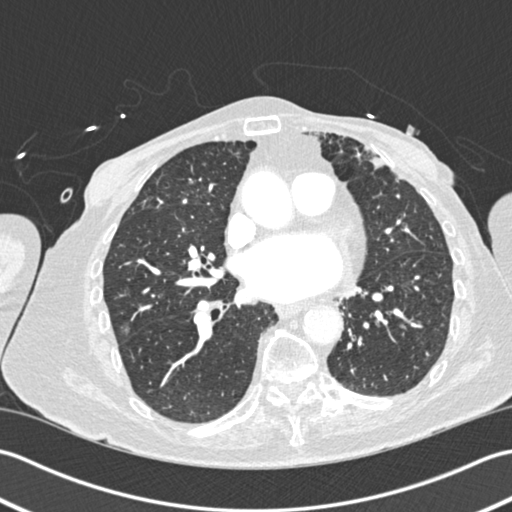
[im 142/283  mediastinal]
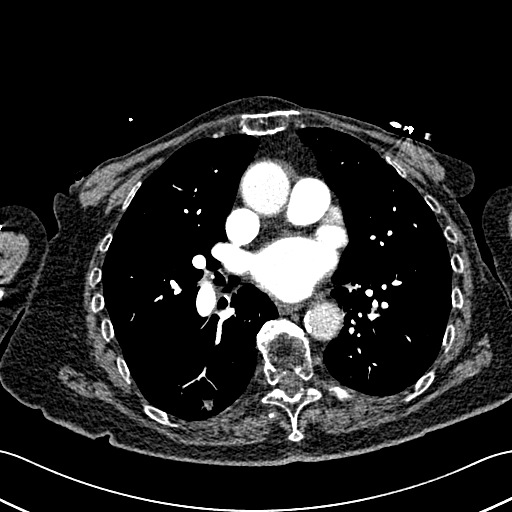
[im 157/283  lung]
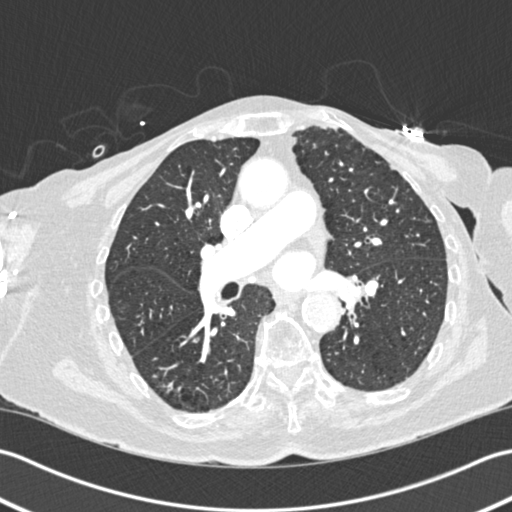
[im 173/283  mediastinal]
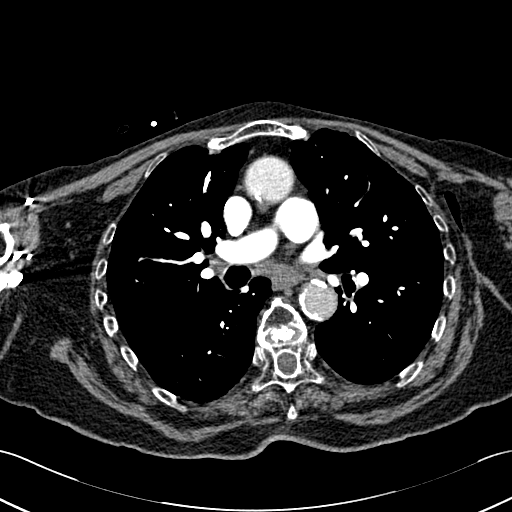
[im 189/283  lung]
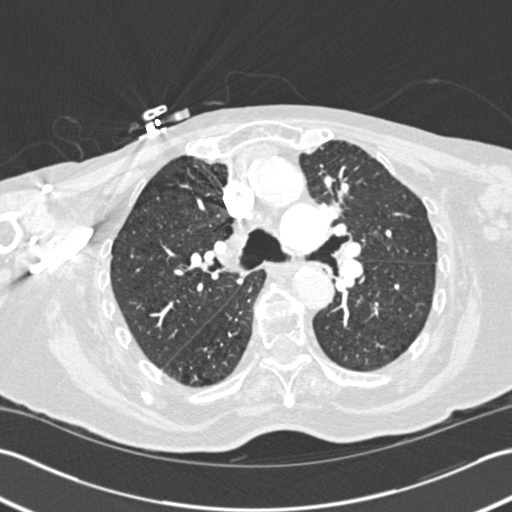
[im 204/283  mediastinal]
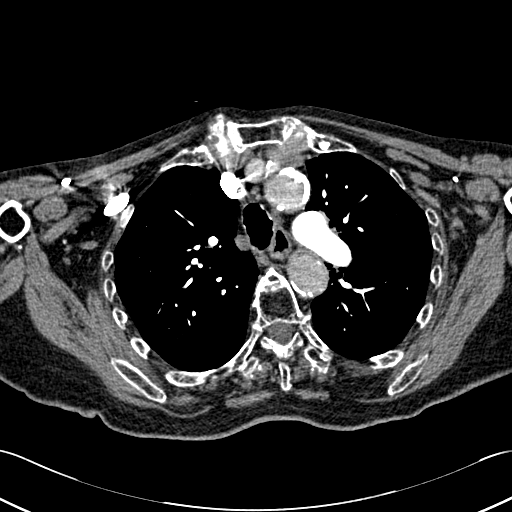
[im 220/283  lung]
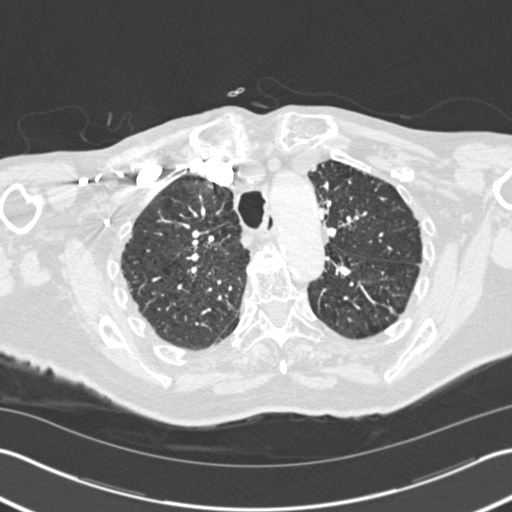
[im 236/283  mediastinal]
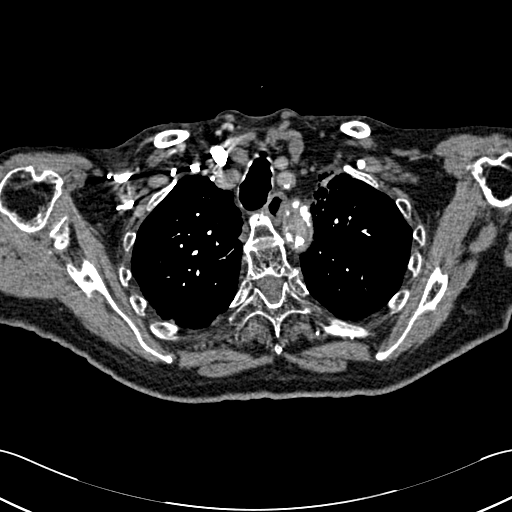
[im 251/283  lung]
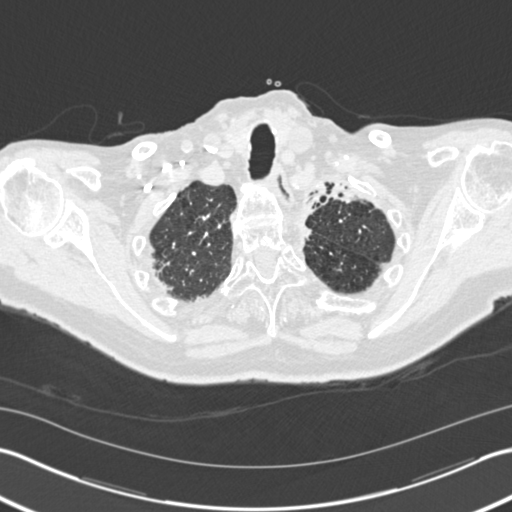
[im 267/283  mediastinal]
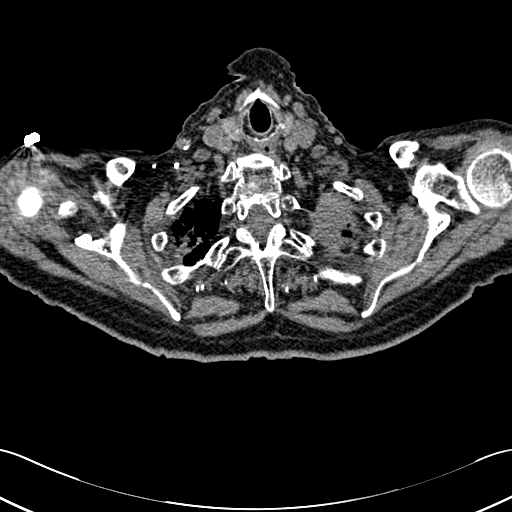

[18 of 30 positions shown; findings below may reference images not displayed]

FINDINGS: Good contrast bolus timing in the pulmonary arterial tree.

No focal filling defect identified in the pulmonary arterial tree to
suggest the presence of acute pulmonary embolism.

Chronic combined centrilobular and subpleural emphysema and
pulmonary hyperinflation. Chronic apical consolidation/scarring with
bronchiectasis and air bronchograms. Regressed superior segment
right lower lobe nodularity since the 1393 CTA, postinflammatory
appearing. Chronic lingula and medial segment right middle lobe
scarring is stable. Regressed and nearly resolved bilateral pleural
effusions compared to the prior CTA. Mild medial lower lobe
nodularity bilaterally today (series 7, image 71 and 72). Mild
ground-glass opacity also in the posterior lateral segment of the
right middle lobe (image 77). No consolidation. Synechiae in the
trachea just above the carina. Major airways are patent with chronic
perihilar peribronchial thickening.

No pericardial effusion. Stable cardiomegaly. Extensive soft and
calcified atherosclerosis of the aorta re- demonstrated. Stable
mediastinal lymph nodes. Extensive calcified coronary artery
atherosclerosis re- demonstrated.

Stable and negative visualized liver, spleen, adrenal glands,
stomach, and left renal upper pole.

Advanced osteopenia. Flowing endplate osteophytes or syndesmophytes
throughout the thoracic spine. Chronic L1 and L2 compression
fractures appear stable. No acute osseous abnormality identified.

Review of the MIP images confirms the above findings.
IMPRESSION: 1.  No evidence of acute pulmonary embolus.
2. Chronic lung disease with hyperinflation, emphysema,
peribronchial thickening, and apical architectural distortion. Mild
distal airway infection in both lower lobes and the lateral segment
right middle lobe today. Nearly resolved pleural effusions seen in
3. Advanced calcified aortic and coronary artery atherosclerosis.
